# Patient Record
Sex: Female | Born: 1955 | Race: White | Hispanic: No | Smoking: Never smoker
Health system: Southern US, Community
[De-identification: ages and names within clinical notes are randomized; demographics above are authoritative.]

## PROBLEM LIST (undated history)

## (undated) ENCOUNTER — Emergency Department: Admission: EM | Payer: 59

## (undated) DIAGNOSIS — F32A Depression, unspecified: Secondary | ICD-10-CM

## (undated) DIAGNOSIS — R112 Nausea with vomiting, unspecified: Secondary | ICD-10-CM

## (undated) DIAGNOSIS — R519 Headache, unspecified: Secondary | ICD-10-CM

## (undated) DIAGNOSIS — Z981 Arthrodesis status: Secondary | ICD-10-CM

## (undated) DIAGNOSIS — E282 Polycystic ovarian syndrome: Secondary | ICD-10-CM

## (undated) DIAGNOSIS — K760 Fatty (change of) liver, not elsewhere classified: Secondary | ICD-10-CM

## (undated) DIAGNOSIS — Z9689 Presence of other specified functional implants: Secondary | ICD-10-CM

## (undated) DIAGNOSIS — F419 Anxiety disorder, unspecified: Secondary | ICD-10-CM

## (undated) DIAGNOSIS — Z8489 Family history of other specified conditions: Secondary | ICD-10-CM

## (undated) DIAGNOSIS — R252 Cramp and spasm: Secondary | ICD-10-CM

## (undated) DIAGNOSIS — M5136 Other intervertebral disc degeneration, lumbar region: Secondary | ICD-10-CM

## (undated) DIAGNOSIS — M545 Low back pain, unspecified: Secondary | ICD-10-CM

## (undated) DIAGNOSIS — G2581 Restless legs syndrome: Secondary | ICD-10-CM

## (undated) DIAGNOSIS — K635 Polyp of colon: Secondary | ICD-10-CM

## (undated) DIAGNOSIS — M51369 Other intervertebral disc degeneration, lumbar region without mention of lumbar back pain or lower extremity pain: Secondary | ICD-10-CM

## (undated) DIAGNOSIS — M549 Dorsalgia, unspecified: Secondary | ICD-10-CM

## (undated) DIAGNOSIS — E7800 Pure hypercholesterolemia, unspecified: Secondary | ICD-10-CM

## (undated) DIAGNOSIS — K759 Inflammatory liver disease, unspecified: Secondary | ICD-10-CM

## (undated) DIAGNOSIS — Z972 Presence of dental prosthetic device (complete) (partial): Secondary | ICD-10-CM

## (undated) DIAGNOSIS — K5900 Constipation, unspecified: Secondary | ICD-10-CM

## (undated) DIAGNOSIS — T7840XA Allergy, unspecified, initial encounter: Secondary | ICD-10-CM

## (undated) DIAGNOSIS — K219 Gastro-esophageal reflux disease without esophagitis: Secondary | ICD-10-CM

## (undated) DIAGNOSIS — Z9889 Other specified postprocedural states: Secondary | ICD-10-CM

## (undated) DIAGNOSIS — G43909 Migraine, unspecified, not intractable, without status migrainosus: Secondary | ICD-10-CM

## (undated) DIAGNOSIS — F329 Major depressive disorder, single episode, unspecified: Secondary | ICD-10-CM

## (undated) DIAGNOSIS — R51 Headache: Secondary | ICD-10-CM

## (undated) DIAGNOSIS — I10 Essential (primary) hypertension: Secondary | ICD-10-CM

## (undated) HISTORY — PX: DILATION AND CURETTAGE OF UTERUS: SHX78

## (undated) HISTORY — PX: COLPORRHAPHY: SHX921

## (undated) HISTORY — PX: BREAST BIOPSY: SHX20

## (undated) HISTORY — PX: BACK SURGERY: SHX140

## (undated) HISTORY — DX: Polyp of colon: K63.5

## (undated) HISTORY — PX: ABDOMINAL HYSTERECTOMY: SHX81

## (undated) HISTORY — PX: LAMINECTOMY: SHX219

## (undated) HISTORY — PX: CARPAL TUNNEL RELEASE: SHX101

## (undated) HISTORY — PX: TUBAL LIGATION: SHX77

## (undated) HISTORY — DX: Cramp and spasm: R25.2

## (undated) HISTORY — PX: TOTAL ABDOMINAL HYSTERECTOMY: SHX209

## (undated) HISTORY — DX: Arthrodesis status: Z98.1

## (undated) HISTORY — DX: Migraine, unspecified, not intractable, without status migrainosus: G43.909

## (undated) HISTORY — DX: Allergy, unspecified, initial encounter: T78.40XA

## (undated) HISTORY — PX: EYE SURGERY: SHX253

## (undated) HISTORY — PX: SPINE SURGERY: SHX786

## (undated) HISTORY — PX: COLONOSCOPY: SHX174

---

## 1980-06-23 HISTORY — PX: ECTOPIC PREGNANCY SURGERY: SHX613

## 1983-06-24 HISTORY — PX: TONSILLECTOMY: SUR1361

## 1986-06-23 HISTORY — PX: APPENDECTOMY: SHX54

## 1986-06-23 HISTORY — PX: HERNIA REPAIR: SHX51

## 1999-12-03 ENCOUNTER — Other Ambulatory Visit: Admission: RE | Admit: 1999-12-03 | Discharge: 1999-12-03 | Payer: Self-pay | Admitting: *Deleted

## 2001-12-23 ENCOUNTER — Other Ambulatory Visit: Admission: RE | Admit: 2001-12-23 | Discharge: 2001-12-23 | Payer: Self-pay | Admitting: Obstetrics and Gynecology

## 2002-06-23 HISTORY — PX: OTHER SURGICAL HISTORY: SHX169

## 2003-06-24 HISTORY — PX: BREAST SURGERY: SHX581

## 2004-04-05 ENCOUNTER — Ambulatory Visit (HOSPITAL_COMMUNITY): Admission: RE | Admit: 2004-04-05 | Discharge: 2004-04-06 | Payer: Self-pay | Admitting: Neurosurgery

## 2004-06-23 HISTORY — PX: BLADDER SUSPENSION: SHX72

## 2004-06-23 HISTORY — PX: NECK SURGERY: SHX720

## 2004-07-04 ENCOUNTER — Ambulatory Visit: Payer: Self-pay | Admitting: General Surgery

## 2004-09-26 ENCOUNTER — Ambulatory Visit: Payer: Self-pay | Admitting: Unknown Physician Specialty

## 2005-06-23 DIAGNOSIS — K760 Fatty (change of) liver, not elsewhere classified: Secondary | ICD-10-CM | POA: Insufficient documentation

## 2005-07-09 ENCOUNTER — Ambulatory Visit: Payer: Self-pay | Admitting: Pain Medicine

## 2005-07-17 ENCOUNTER — Ambulatory Visit: Payer: Self-pay | Admitting: Pain Medicine

## 2005-08-01 ENCOUNTER — Ambulatory Visit: Payer: Self-pay | Admitting: Physician Assistant

## 2005-08-07 ENCOUNTER — Ambulatory Visit: Payer: Self-pay | Admitting: Pain Medicine

## 2005-09-01 ENCOUNTER — Ambulatory Visit: Payer: Self-pay | Admitting: Pain Medicine

## 2005-09-08 ENCOUNTER — Ambulatory Visit: Payer: Self-pay | Admitting: Family Medicine

## 2005-10-07 ENCOUNTER — Encounter: Admission: RE | Admit: 2005-10-07 | Discharge: 2005-10-07 | Payer: Self-pay | Admitting: Neurosurgery

## 2005-10-17 ENCOUNTER — Ambulatory Visit: Payer: Self-pay | Admitting: General Surgery

## 2005-10-22 ENCOUNTER — Ambulatory Visit: Payer: Self-pay | Admitting: Family Medicine

## 2005-11-18 ENCOUNTER — Encounter: Admission: RE | Admit: 2005-11-18 | Discharge: 2005-11-18 | Payer: Self-pay | Admitting: Gastroenterology

## 2005-12-09 ENCOUNTER — Inpatient Hospital Stay (HOSPITAL_COMMUNITY): Admission: RE | Admit: 2005-12-09 | Discharge: 2005-12-13 | Payer: Self-pay | Admitting: Neurosurgery

## 2006-01-01 ENCOUNTER — Ambulatory Visit: Payer: Self-pay | Admitting: Urology

## 2006-01-14 ENCOUNTER — Ambulatory Visit: Payer: Self-pay | Admitting: Urology

## 2006-01-23 ENCOUNTER — Ambulatory Visit: Payer: Self-pay | Admitting: Urology

## 2006-04-16 DIAGNOSIS — F324 Major depressive disorder, single episode, in partial remission: Secondary | ICD-10-CM | POA: Insufficient documentation

## 2006-05-26 ENCOUNTER — Ambulatory Visit: Payer: Self-pay | Admitting: Family Medicine

## 2006-07-23 ENCOUNTER — Encounter: Admission: RE | Admit: 2006-07-23 | Discharge: 2006-07-23 | Payer: Self-pay | Admitting: Neurosurgery

## 2006-08-19 DIAGNOSIS — M51379 Other intervertebral disc degeneration, lumbosacral region without mention of lumbar back pain or lower extremity pain: Secondary | ICD-10-CM | POA: Insufficient documentation

## 2006-08-19 DIAGNOSIS — M5137 Other intervertebral disc degeneration, lumbosacral region: Secondary | ICD-10-CM | POA: Insufficient documentation

## 2006-09-10 ENCOUNTER — Ambulatory Visit: Payer: Self-pay | Admitting: Unknown Physician Specialty

## 2007-02-25 DIAGNOSIS — E785 Hyperlipidemia, unspecified: Secondary | ICD-10-CM | POA: Insufficient documentation

## 2007-02-26 ENCOUNTER — Ambulatory Visit: Payer: Self-pay | Admitting: General Practice

## 2007-05-13 ENCOUNTER — Ambulatory Visit: Payer: Self-pay | Admitting: Urology

## 2007-05-21 ENCOUNTER — Ambulatory Visit: Payer: Self-pay | Admitting: General Practice

## 2007-09-16 ENCOUNTER — Encounter: Admission: RE | Admit: 2007-09-16 | Discharge: 2007-09-16 | Payer: Self-pay | Admitting: Neurosurgery

## 2008-03-10 ENCOUNTER — Encounter: Admission: RE | Admit: 2008-03-10 | Discharge: 2008-03-10 | Payer: Self-pay | Admitting: Neurosurgery

## 2008-05-02 ENCOUNTER — Ambulatory Visit: Payer: Self-pay | Admitting: Family Medicine

## 2008-06-14 ENCOUNTER — Ambulatory Visit: Payer: Self-pay | Admitting: Gastroenterology

## 2008-06-19 ENCOUNTER — Ambulatory Visit: Payer: Self-pay | Admitting: Urology

## 2009-01-29 DIAGNOSIS — M62838 Other muscle spasm: Secondary | ICD-10-CM | POA: Insufficient documentation

## 2009-01-29 DIAGNOSIS — R252 Cramp and spasm: Secondary | ICD-10-CM

## 2009-01-29 HISTORY — DX: Cramp and spasm: R25.2

## 2009-03-27 ENCOUNTER — Ambulatory Visit: Payer: Self-pay | Admitting: Urology

## 2009-05-01 ENCOUNTER — Encounter: Payer: Self-pay | Admitting: General Practice

## 2009-05-01 DIAGNOSIS — K4090 Unilateral inguinal hernia, without obstruction or gangrene, not specified as recurrent: Secondary | ICD-10-CM | POA: Insufficient documentation

## 2009-05-10 ENCOUNTER — Ambulatory Visit: Payer: Self-pay | Admitting: Family Medicine

## 2009-05-23 ENCOUNTER — Encounter: Payer: Self-pay | Admitting: General Practice

## 2009-06-23 ENCOUNTER — Encounter: Payer: Self-pay | Admitting: General Practice

## 2009-08-06 ENCOUNTER — Ambulatory Visit: Payer: Self-pay | Admitting: Pain Medicine

## 2009-08-20 ENCOUNTER — Encounter: Payer: Self-pay | Admitting: Pain Medicine

## 2009-08-27 ENCOUNTER — Encounter: Payer: Self-pay | Admitting: Pain Medicine

## 2009-09-17 ENCOUNTER — Ambulatory Visit: Payer: Self-pay | Admitting: Pain Medicine

## 2009-09-21 ENCOUNTER — Encounter: Payer: Self-pay | Admitting: Pain Medicine

## 2009-09-25 ENCOUNTER — Ambulatory Visit: Payer: Self-pay | Admitting: Pain Medicine

## 2009-10-01 ENCOUNTER — Ambulatory Visit: Payer: Self-pay | Admitting: Gastroenterology

## 2009-10-29 ENCOUNTER — Ambulatory Visit: Payer: Self-pay | Admitting: Pain Medicine

## 2009-11-01 ENCOUNTER — Ambulatory Visit: Payer: Self-pay | Admitting: Pain Medicine

## 2009-12-03 ENCOUNTER — Ambulatory Visit: Payer: Self-pay | Admitting: Pain Medicine

## 2009-12-03 DIAGNOSIS — E669 Obesity, unspecified: Secondary | ICD-10-CM | POA: Insufficient documentation

## 2009-12-20 ENCOUNTER — Ambulatory Visit: Payer: Self-pay | Admitting: Pain Medicine

## 2010-01-21 ENCOUNTER — Ambulatory Visit: Payer: Self-pay | Admitting: Pain Medicine

## 2010-02-04 ENCOUNTER — Ambulatory Visit: Payer: Self-pay | Admitting: Pain Medicine

## 2010-03-18 ENCOUNTER — Ambulatory Visit: Payer: Self-pay | Admitting: Pain Medicine

## 2010-04-04 ENCOUNTER — Ambulatory Visit: Payer: Self-pay | Admitting: Urology

## 2010-04-08 LAB — HM HEPATITIS C SCREENING LAB: HM Hepatitis Screen: NEGATIVE

## 2010-05-23 ENCOUNTER — Ambulatory Visit: Payer: Self-pay | Admitting: Family Medicine

## 2010-07-14 ENCOUNTER — Encounter: Payer: Self-pay | Admitting: Neurosurgery

## 2010-07-18 ENCOUNTER — Ambulatory Visit: Payer: Self-pay | Admitting: Pain Medicine

## 2010-08-12 ENCOUNTER — Ambulatory Visit: Payer: Self-pay | Admitting: Pain Medicine

## 2010-08-19 ENCOUNTER — Ambulatory Visit: Payer: Self-pay | Admitting: Pain Medicine

## 2010-09-10 ENCOUNTER — Ambulatory Visit: Payer: Self-pay | Admitting: Pain Medicine

## 2010-09-19 ENCOUNTER — Ambulatory Visit: Payer: Self-pay | Admitting: Pain Medicine

## 2010-09-30 ENCOUNTER — Ambulatory Visit: Payer: Self-pay | Admitting: Pain Medicine

## 2010-11-08 NOTE — Op Note (Signed)
[name], Courtney Ross               ACCOUNT NO.:  [id number]   MEDICAL RECORD NO.:  [id number]          PATIENT TYPE:  OIB   LOCATION:  2899                         FACILITY:  [facility]   PHYSICIAN:  Payton Doughty, M.D.      DATE OF BIRTH:  02-Jul-1955   DATE OF PROCEDURE:  04/05/2004  DATE OF DISCHARGE:                                 OPERATIVE REPORT   PREOPERATIVE DIAGNOSIS:  Herniated disk C5-6 on the left.   POSTOPERATIVE DIAGNOSIS:  Herniated disk C5-6 on the left.   PROCEDURE:  Anterior cervical decompression and fusion with reflex hybrid  plate.   SURGEON:  Payton Doughty, M.D.   NURSE ASSISTANT:  [facility].   ANESTHESIA:  General endotracheal.   PREPARATION:  Betadine prep, alcohol wipe.   COMPLICATIONS:  None.   INDICATIONS FOR PROCEDURE:  A 55 year old lady with left C6 radiculopathy  and herniated disk.   DESCRIPTION OF PROCEDURE:  Taken to the operating room, smoothly intubated,  placed supine on the operating table in Halter head traction.  Following  shave, prep and drape in the usual sterile fashion, skin was incised at the  midline, medial border of the sternocleidomastoid muscle on the left side.  The platysma was identified, elevated, divided, undermined.  Sternocleidomastoid was identified.  Medial dissection revealed the carotid  artery tracked laterally to the left, trachea and esophagus retracted  laterally to the right.  This exposed the bones of the anterior cervical  spine.  Marker was placed, intraoperative x-ray obtained to confirm  correctness of level.   Having confirmed correctness of level, diskectomy was carried out at C6-7  under gross observation. The operating microscope was then brought in and we  used microdissection technique to remove the remaining disk, divide the  posterior longitudinal ligament and explore the neuroforamen.  There were  several large fragments of disk recovered from the left C6 neuroforamen.  Following complete  decompression of the nerve root, the wound was irrigated  and hemostasis was assured.   The 7 mm bone graft was taped, and fashioned from patellar allograft, tapped  into place.  A 14 reflex hybrid plate was then used with 12 mm screws.  Intraoperative x-ray showed good placement of bone graft plate and screws.  The platysma was reapproximated with 3-0 Vicryl in interrupted fashion,  subcutaneous tissue was reapproximated with 3-0 Vicryl in interrupted  fashion and skin was closed with 4-0 Vicryl in running, subcuticular  fashion.  Benzoin  and Steri-Strips were placed,  made occlusive with Telfa and Op-Site.  The patient returned to the recovery  room after being placed in an Aspen collar.       MWR/MEDQ  D:  04/05/2004  T:  04/05/2004  Job:  [id number]

## 2010-11-08 NOTE — H&P (Signed)
NAMEJUSTYN, Courtney Ross               ACCOUNT NO.:  [id number]   MEDICAL RECORD NO.:  [id number]          PATIENT TYPE:  OIB   LOCATION:  2899                         FACILITY:  [facility]   PHYSICIAN:  Payton Doughty, M.D.      DATE OF BIRTH:  April 22, 1956   DATE OF ADMISSION:  04/05/2004  DATE OF DISCHARGE:                                HISTORY & PHYSICAL   ADMISSION DIAGNOSES:  Herniated disk C5-6 on the left.   HISTORY OF PRESENT ILLNESS:  This is a 55 year old right handed white female  who for a couple of weeks has had increasing pain in her neck, down her left  arm.  MR shows herniated disk at 5-6 on the left, and she is admitted for an  anterior decompression fusion.   PAST MEDICAL HISTORY:  1.  Hernia and appendectomy in 1991.  2.  Tonsillectomy in 1986.  3.  Tubal pregnancy in 1980.  4.  Hysterectomy in 1986.  5.  Lumbar diskectomy in 1993 and 1994.   ALLERGIES:  None.   MEDICATIONS:  None.   SOCIAL HISTORY:  She does not smoke or drink.  She is employed at a [employer].   PHYSICAL EXAMINATION:  HEENT:  Within normal limits.  She has reasonable  range of motion in her neck, but reproduces left arm pain.  CHEST:  Clear.  CARDIAC:  Regular rate and rhythm.  ABDOMEN:  Nontender.  No hepatosplenomegaly.  EXTREMITIES:  No cyanosis, clubbing.  Peripheral pulses are good.  GU:  Deferred.  NEUROLOGICAL:  She is awake, alert and oriented.  Cranial nerves are intact.  Lower exam shows 5/5 strength throughout the upper extremities, save for  left triceps which is 4/5.  Sensation diminished in the left C6 and C7  distribution.  Biceps reflexes are 2, triceps is 1 on the left and 2 on the  right.  Toes are downgoing.  Hoffman's is negative.   STUDIES:  MR demonstrates a herniated disk at C5-6, eccentric to the left.   CLINICAL IMPRESSION:  A left C6 radiculopathy secondary to herniated disk at  C5-C6.   PLAN:  Anterior cervical decompression at C5-6.  Risks and benefits of  this  approach have been discussed with her, and she wished to proceed.       MWR/MEDQ  D:  04/05/2004  T:  04/05/2004  Job:  [id number]

## 2010-11-08 NOTE — H&P (Signed)
Courtney Ross, Courtney Ross               ACCOUNT NO.:  [id number]   MEDICAL RECORD NO.:  [id number]          PATIENT TYPE:  INP   LOCATION:  2853                         FACILITY:  [facility]   PHYSICIAN:  Payton Doughty, M.D.      DATE OF BIRTH:  05-30-1956   DATE OF ADMISSION:  12/09/2005  DATE OF DISCHARGE:                                HISTORY & PHYSICAL   ADMISSION DIAGNOSIS:  Spondylosis, L4-5, L5-S1.   BODY OF TEXT:  A 55 year old right-handed white female has had increasing  pain in her back and down her lower extremities bilaterally, worse on the  right than on the left.  Diskography was strongly concordant positive, 4-5  and 5-1.  She is admitted for fusion.  Medical history is remarkable for  hernia and appendectomy in 1991, tonsillectomy in 1986, tubal pregnancy in  1980, hysterectomy in 1986, diskectomy and redo diskectomy at 5-1 on the  right in 1993 and 1994.   ALLERGIES:  None.   MEDICATIONS:  None.   SOCIAL HISTORY:  Does not smoke.  Does not drink.  Currently not working.   FAMILY HISTORY:  Not given.   REVIEW OF SYSTEMS:  Remarkable for back pain, leg pain.   PHYSICAL EXAMINATION:  HEENT:  Within normal limits.  NECK:  She has reasonable range of motion in her neck.  CHEST:  Clear.  CARDIAC:  Regular rate and rhythm.  ABDOMEN:  Nontender with no hepatosplenomegaly.  EXTREMITIES:  Without clubbing or cyanosis.  GU:  Deferred.  Peripheral pulses are good.  NEUROLOGIC:  She is awake, alert and oriented.  Cranial nerves are intact.  Motor exam showed 5/5 strength throughout the upper and lower extremities  save for the dorsiflexors on the right, which are -5/5.  She has bilateral  L5 sensory deficits.  Reflexes are 2+ at the knees, absent at the ankles.  Toes are downgoing bilaterally.   Diskography results are included and reviewed above.   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   The plan is for lumbar laminectomy, diskectomy, posterior lumbar  interbody  fusion, 4-5 and 5-1, and posterolateral arthrodesis pedicle screws will be  employed should they be needed.  The risks and benefits of this approach  have been discussed with her, and she wishes to proceed.           ______________________________  Payton Doughty, M.D.     MWR/MEDQ  D:  12/09/2005  T:  12/09/2005  Job:  [id number]

## 2010-11-08 NOTE — Op Note (Signed)
NAMEREHMAT, MURTAGH               ACCOUNT NO.:  [id number]   MEDICAL RECORD NO.:  [id number]          PATIENT TYPE:  INP   LOCATION:  3002                         FACILITY:  [facility]   PHYSICIAN:  Payton Doughty, M.D.      DATE OF BIRTH:  02/18/1956   DATE OF PROCEDURE:  12/09/2005  DATE OF DISCHARGE:                                 OPERATIVE REPORT   PREOPERATIVE DIAGNOSIS:  Spondylosis at L4-5, L5-S1.   POSTOPERATIVE DIAGNOSIS:  Spondylosis at L4-5, L5-S1.   PROCEDURE:  L4-5 laminectomy and diskectomy, posterior lumbar interbody  fusion with Ray Threaded Fusion Cages, posterolateral arthrodesis, and L5-S1  laminectomy, facetectomy, nonsegmental pedicle screw fixation and  posterolateral arthrodesis.   SURGEON:  Payton Doughty, M.D.   NURSE ASSISTANT:  [facility].   DOCTOR ASSISTANT:  Clydene Fake, M.D.   ANESTHESIA:  General endotracheal.   PREPARATION:  Betadine prep and scrub with alcohol wipe.   COMPLICATIONS:  None.   BODY OF TEXT:  This is a 55 year old right-handed white girl with severe  lumbar spondylosis.  She has had a prior L5-S1 diskectomy twice on the  right.  Taken to the operating room and smoothly anesthetized and intubated  and placed prone on the operating table with pressure points padded.  Following shave, prep and drape in the usual sterile fashion, the skin was  infiltrated with 1% lidocaine and 1:400,000 epinephrine.  The skin was  incised from mid-S1 to mid-L3.  The laminae and transverse processes of L4,  L5 and the sacral ala were exposed bilaterally in the subperiosteal plane.  Intraoperative x-ray confirmed correctness of the level.  Having confirmed  correctness of the level, the pars interarticularis, remaining lamina and  inferior facet of L4 and L5 and the superior facet of L5 and S1 were removed  bilaterally using the high-speed drill.  On the right side there was a  fracture of the superior articular process of L5.  This fracture was tipped  down and directly into the right L5 nerve root.  Following complete  decompression by removing the above-named structures and the attendant  ligamentum flavum, both the L4 and L5 roots were easily identified.  The L5  root on the left side was conjoined.  The disk space was quite effaced.  It  was not possible to place Ray Cages at L5-S1.  L4-5 presented no difficulty,  12 x 21 mm cages were placed with a good fit.  Pedicle screws were placed in  L5 and S1 using the standard landmarks.  Rods were placed across them and  they were connected.  Cages were packed with bone harvested from the facet  joints and capped.  OP-1 was mixed with remaining bone and the extender  matrix and placed on the transverse processes and sacral ala.  Intraoperative x-ray  showed good placement of the Ray Cages and pedicle screws.  The entire  affair was closed with successive layers of 0 Vicryl, 2-0 Vicryl and 3-0  nylon.  Betadine and Telfa dressing was applied and the patient returned to  the recovery room in good  condition.           ______________________________  Payton Doughty, M.D.     MWR/MEDQ  D:  12/09/2005  T:  12/10/2005  Job:  [id number]

## 2010-11-08 NOTE — Discharge Summary (Signed)
[name], [name]               ACCOUNT NO.:  [id number]   MEDICAL RECORD NO.:  [id number]          PATIENT TYPE:  INP   LOCATION:  3002                         FACILITY:  [facility]   PHYSICIAN:  Clydene Fake, M.D.  DATE OF BIRTH:  02-09-1956   DATE OF ADMISSION:  12/09/2005  DATE OF DISCHARGE:  12/13/2005                                 DISCHARGE SUMMARY   DIAGNOSIS:  Spondylosis of L4-5 and L5-S1.   DISCHARGE DIAGNOSIS:  Spondylosis of L4-5 and L5-S1.   PROCEDURE:  1.  L4-5 __________ cage.  2.  L5-S1 decompression and fusion.   REASON FOR ADMISSION:  The patient is a 55 year old woman with increasing  back and bilateral leg pain, worse on the right.  Discogram showed  concordant __________  at L4-5 and L5-S1.  She was admitted for  decompression and fusion.   HOSPITAL COURSE:  The patient was admitted on the day of surgery and  underwent the procedure without complications.  Postop the patient was  transferred to the floor.  She did well.  Started ambulating.  Incision was  checked and was clean and dry.  PT and OT were consulted and worked with the  patient.  She continued to __________  and is ambulating well.  Less leg  pain and was discharged home on December 13, 2005, in stable condition.  Discharge __________  hospitalization.  Follow up with Dr. __________  in 3-  4 weeks.  __________  activity.           ______________________________  Clydene Fake, M.D.     JRH/MEDQ  D:  01/01/2006  T:  01/02/2006  Job:  [id number]

## 2011-07-02 ENCOUNTER — Ambulatory Visit: Payer: Self-pay | Admitting: Family Medicine

## 2011-07-02 DIAGNOSIS — Z1231 Encounter for screening mammogram for malignant neoplasm of breast: Secondary | ICD-10-CM | POA: Diagnosis not present

## 2011-07-02 DIAGNOSIS — R928 Other abnormal and inconclusive findings on diagnostic imaging of breast: Secondary | ICD-10-CM | POA: Diagnosis not present

## 2011-11-13 DIAGNOSIS — Z124 Encounter for screening for malignant neoplasm of cervix: Secondary | ICD-10-CM | POA: Diagnosis not present

## 2011-11-13 DIAGNOSIS — Z0000 Encounter for general adult medical examination without abnormal findings: Secondary | ICD-10-CM | POA: Diagnosis not present

## 2011-12-05 DIAGNOSIS — M5030 Other cervical disc degeneration, unspecified cervical region: Secondary | ICD-10-CM | POA: Diagnosis not present

## 2011-12-05 DIAGNOSIS — E785 Hyperlipidemia, unspecified: Secondary | ICD-10-CM | POA: Diagnosis not present

## 2011-12-05 DIAGNOSIS — F329 Major depressive disorder, single episode, unspecified: Secondary | ICD-10-CM | POA: Diagnosis not present

## 2011-12-05 DIAGNOSIS — E782 Mixed hyperlipidemia: Secondary | ICD-10-CM | POA: Diagnosis not present

## 2011-12-05 DIAGNOSIS — R894 Abnormal immunological findings in specimens from other organs, systems and tissues: Secondary | ICD-10-CM | POA: Diagnosis not present

## 2011-12-11 DIAGNOSIS — M47817 Spondylosis without myelopathy or radiculopathy, lumbosacral region: Secondary | ICD-10-CM | POA: Diagnosis not present

## 2012-05-25 DIAGNOSIS — Z23 Encounter for immunization: Secondary | ICD-10-CM | POA: Diagnosis not present

## 2012-06-08 DIAGNOSIS — F329 Major depressive disorder, single episode, unspecified: Secondary | ICD-10-CM | POA: Diagnosis not present

## 2012-06-08 DIAGNOSIS — J019 Acute sinusitis, unspecified: Secondary | ICD-10-CM | POA: Diagnosis not present

## 2012-06-08 DIAGNOSIS — M65849 Other synovitis and tenosynovitis, unspecified hand: Secondary | ICD-10-CM | POA: Diagnosis not present

## 2012-06-08 DIAGNOSIS — F411 Generalized anxiety disorder: Secondary | ICD-10-CM | POA: Diagnosis not present

## 2012-06-08 DIAGNOSIS — M65839 Other synovitis and tenosynovitis, unspecified forearm: Secondary | ICD-10-CM | POA: Diagnosis not present

## 2012-06-23 HISTORY — PX: SPINAL CORD STIMULATOR IMPLANT: SHX2422

## 2012-07-05 ENCOUNTER — Ambulatory Visit: Payer: Self-pay | Admitting: Obstetrics and Gynecology

## 2012-07-05 DIAGNOSIS — R928 Other abnormal and inconclusive findings on diagnostic imaging of breast: Secondary | ICD-10-CM | POA: Diagnosis not present

## 2012-07-05 DIAGNOSIS — Z1231 Encounter for screening mammogram for malignant neoplasm of breast: Secondary | ICD-10-CM | POA: Diagnosis not present

## 2012-11-16 DIAGNOSIS — Z01419 Encounter for gynecological examination (general) (routine) without abnormal findings: Secondary | ICD-10-CM | POA: Diagnosis not present

## 2012-11-16 DIAGNOSIS — Z124 Encounter for screening for malignant neoplasm of cervix: Secondary | ICD-10-CM | POA: Diagnosis not present

## 2012-11-16 DIAGNOSIS — Z1331 Encounter for screening for depression: Secondary | ICD-10-CM | POA: Diagnosis not present

## 2012-12-02 DIAGNOSIS — Z86010 Personal history of colonic polyps: Secondary | ICD-10-CM | POA: Diagnosis not present

## 2012-12-02 DIAGNOSIS — K921 Melena: Secondary | ICD-10-CM | POA: Diagnosis not present

## 2012-12-02 DIAGNOSIS — K5900 Constipation, unspecified: Secondary | ICD-10-CM | POA: Diagnosis not present

## 2012-12-15 DIAGNOSIS — F329 Major depressive disorder, single episode, unspecified: Secondary | ICD-10-CM | POA: Diagnosis not present

## 2012-12-15 DIAGNOSIS — G894 Chronic pain syndrome: Secondary | ICD-10-CM | POA: Diagnosis not present

## 2012-12-15 DIAGNOSIS — F411 Generalized anxiety disorder: Secondary | ICD-10-CM | POA: Diagnosis not present

## 2012-12-15 DIAGNOSIS — M5137 Other intervertebral disc degeneration, lumbosacral region: Secondary | ICD-10-CM | POA: Diagnosis not present

## 2012-12-15 DIAGNOSIS — E785 Hyperlipidemia, unspecified: Secondary | ICD-10-CM | POA: Diagnosis not present

## 2012-12-16 DIAGNOSIS — E782 Mixed hyperlipidemia: Secondary | ICD-10-CM | POA: Diagnosis not present

## 2013-01-12 DIAGNOSIS — Z86010 Personal history of colonic polyps: Secondary | ICD-10-CM | POA: Diagnosis not present

## 2013-01-12 DIAGNOSIS — R197 Diarrhea, unspecified: Secondary | ICD-10-CM | POA: Diagnosis not present

## 2013-01-12 DIAGNOSIS — K921 Melena: Secondary | ICD-10-CM | POA: Diagnosis not present

## 2013-01-12 DIAGNOSIS — Z09 Encounter for follow-up examination after completed treatment for conditions other than malignant neoplasm: Secondary | ICD-10-CM | POA: Diagnosis not present

## 2013-01-12 LAB — HM COLONOSCOPY

## 2013-01-19 DIAGNOSIS — M47817 Spondylosis without myelopathy or radiculopathy, lumbosacral region: Secondary | ICD-10-CM | POA: Diagnosis not present

## 2013-02-28 DIAGNOSIS — H00019 Hordeolum externum unspecified eye, unspecified eyelid: Secondary | ICD-10-CM | POA: Diagnosis not present

## 2013-03-24 DIAGNOSIS — Z23 Encounter for immunization: Secondary | ICD-10-CM | POA: Diagnosis not present

## 2013-07-27 DIAGNOSIS — F3289 Other specified depressive episodes: Secondary | ICD-10-CM | POA: Diagnosis not present

## 2013-07-27 DIAGNOSIS — E785 Hyperlipidemia, unspecified: Secondary | ICD-10-CM | POA: Diagnosis not present

## 2013-07-27 DIAGNOSIS — J019 Acute sinusitis, unspecified: Secondary | ICD-10-CM | POA: Diagnosis not present

## 2013-07-27 DIAGNOSIS — F411 Generalized anxiety disorder: Secondary | ICD-10-CM | POA: Diagnosis not present

## 2013-07-27 DIAGNOSIS — F329 Major depressive disorder, single episode, unspecified: Secondary | ICD-10-CM | POA: Diagnosis not present

## 2014-02-02 DIAGNOSIS — M47817 Spondylosis without myelopathy or radiculopathy, lumbosacral region: Secondary | ICD-10-CM | POA: Diagnosis not present

## 2014-02-10 DIAGNOSIS — J069 Acute upper respiratory infection, unspecified: Secondary | ICD-10-CM | POA: Diagnosis not present

## 2014-02-10 DIAGNOSIS — IMO0002 Reserved for concepts with insufficient information to code with codable children: Secondary | ICD-10-CM | POA: Diagnosis not present

## 2014-02-10 DIAGNOSIS — M75100 Unspecified rotator cuff tear or rupture of unspecified shoulder, not specified as traumatic: Secondary | ICD-10-CM | POA: Diagnosis not present

## 2014-02-10 DIAGNOSIS — E785 Hyperlipidemia, unspecified: Secondary | ICD-10-CM | POA: Diagnosis not present

## 2014-02-10 DIAGNOSIS — F411 Generalized anxiety disorder: Secondary | ICD-10-CM | POA: Diagnosis not present

## 2014-02-23 DIAGNOSIS — IMO0002 Reserved for concepts with insufficient information to code with codable children: Secondary | ICD-10-CM | POA: Diagnosis not present

## 2014-02-23 DIAGNOSIS — M75100 Unspecified rotator cuff tear or rupture of unspecified shoulder, not specified as traumatic: Secondary | ICD-10-CM | POA: Diagnosis not present

## 2014-02-23 DIAGNOSIS — M7520 Bicipital tendinitis, unspecified shoulder: Secondary | ICD-10-CM | POA: Diagnosis not present

## 2014-03-20 DIAGNOSIS — H00029 Hordeolum internum unspecified eye, unspecified eyelid: Secondary | ICD-10-CM | POA: Diagnosis not present

## 2014-03-20 DIAGNOSIS — H524 Presbyopia: Secondary | ICD-10-CM | POA: Diagnosis not present

## 2014-04-04 DIAGNOSIS — M5412 Radiculopathy, cervical region: Secondary | ICD-10-CM | POA: Diagnosis not present

## 2014-04-04 DIAGNOSIS — M25811 Other specified joint disorders, right shoulder: Secondary | ICD-10-CM | POA: Diagnosis not present

## 2014-04-04 DIAGNOSIS — M7521 Bicipital tendinitis, right shoulder: Secondary | ICD-10-CM | POA: Diagnosis not present

## 2014-04-11 ENCOUNTER — Ambulatory Visit: Payer: Self-pay | Admitting: Specialist

## 2014-04-11 DIAGNOSIS — M25511 Pain in right shoulder: Secondary | ICD-10-CM | POA: Diagnosis not present

## 2014-04-11 DIAGNOSIS — M19011 Primary osteoarthritis, right shoulder: Secondary | ICD-10-CM | POA: Diagnosis not present

## 2014-04-11 DIAGNOSIS — M25551 Pain in right hip: Secondary | ICD-10-CM | POA: Diagnosis not present

## 2014-04-11 DIAGNOSIS — M25811 Other specified joint disorders, right shoulder: Secondary | ICD-10-CM | POA: Diagnosis not present

## 2014-04-13 DIAGNOSIS — M7521 Bicipital tendinitis, right shoulder: Secondary | ICD-10-CM | POA: Diagnosis not present

## 2014-04-13 DIAGNOSIS — M25811 Other specified joint disorders, right shoulder: Secondary | ICD-10-CM | POA: Diagnosis not present

## 2014-04-13 DIAGNOSIS — M5412 Radiculopathy, cervical region: Secondary | ICD-10-CM | POA: Diagnosis not present

## 2014-04-21 DIAGNOSIS — M7521 Bicipital tendinitis, right shoulder: Secondary | ICD-10-CM | POA: Diagnosis not present

## 2014-04-21 DIAGNOSIS — M5412 Radiculopathy, cervical region: Secondary | ICD-10-CM | POA: Diagnosis not present

## 2014-04-21 DIAGNOSIS — M25511 Pain in right shoulder: Secondary | ICD-10-CM | POA: Diagnosis not present

## 2014-04-21 DIAGNOSIS — M25811 Other specified joint disorders, right shoulder: Secondary | ICD-10-CM | POA: Diagnosis not present

## 2014-04-25 DIAGNOSIS — M25811 Other specified joint disorders, right shoulder: Secondary | ICD-10-CM | POA: Diagnosis not present

## 2014-04-25 DIAGNOSIS — M5412 Radiculopathy, cervical region: Secondary | ICD-10-CM | POA: Diagnosis not present

## 2014-04-25 DIAGNOSIS — M25511 Pain in right shoulder: Secondary | ICD-10-CM | POA: Diagnosis not present

## 2014-04-28 DIAGNOSIS — M25511 Pain in right shoulder: Secondary | ICD-10-CM | POA: Diagnosis not present

## 2014-04-28 DIAGNOSIS — M5412 Radiculopathy, cervical region: Secondary | ICD-10-CM | POA: Diagnosis not present

## 2014-05-11 DIAGNOSIS — Z23 Encounter for immunization: Secondary | ICD-10-CM | POA: Diagnosis not present

## 2014-05-30 DIAGNOSIS — M47812 Spondylosis without myelopathy or radiculopathy, cervical region: Secondary | ICD-10-CM | POA: Diagnosis not present

## 2014-05-30 DIAGNOSIS — M47816 Spondylosis without myelopathy or radiculopathy, lumbar region: Secondary | ICD-10-CM | POA: Diagnosis not present

## 2014-07-05 ENCOUNTER — Ambulatory Visit: Payer: Self-pay | Admitting: Neurosurgery

## 2014-07-05 DIAGNOSIS — M47812 Spondylosis without myelopathy or radiculopathy, cervical region: Secondary | ICD-10-CM | POA: Diagnosis not present

## 2014-07-05 DIAGNOSIS — M5126 Other intervertebral disc displacement, lumbar region: Secondary | ICD-10-CM | POA: Diagnosis not present

## 2014-07-05 DIAGNOSIS — M47892 Other spondylosis, cervical region: Secondary | ICD-10-CM | POA: Diagnosis not present

## 2014-07-05 DIAGNOSIS — Z981 Arthrodesis status: Secondary | ICD-10-CM | POA: Diagnosis not present

## 2014-07-05 DIAGNOSIS — M5022 Other cervical disc displacement, mid-cervical region: Secondary | ICD-10-CM | POA: Diagnosis not present

## 2014-07-07 DIAGNOSIS — J0100 Acute maxillary sinusitis, unspecified: Secondary | ICD-10-CM | POA: Diagnosis not present

## 2014-07-07 DIAGNOSIS — R05 Cough: Secondary | ICD-10-CM | POA: Diagnosis not present

## 2014-08-09 DIAGNOSIS — M47816 Spondylosis without myelopathy or radiculopathy, lumbar region: Secondary | ICD-10-CM | POA: Diagnosis not present

## 2014-08-09 DIAGNOSIS — M47812 Spondylosis without myelopathy or radiculopathy, cervical region: Secondary | ICD-10-CM | POA: Diagnosis not present

## 2014-08-25 ENCOUNTER — Ambulatory Visit: Payer: Self-pay | Admitting: Family Medicine

## 2014-08-25 DIAGNOSIS — Z1231 Encounter for screening mammogram for malignant neoplasm of breast: Secondary | ICD-10-CM | POA: Diagnosis not present

## 2014-08-25 LAB — HM MAMMOGRAPHY: HM Mammogram: NEGATIVE

## 2014-09-01 DIAGNOSIS — M5030 Other cervical disc degeneration, unspecified cervical region: Secondary | ICD-10-CM | POA: Diagnosis not present

## 2014-09-01 DIAGNOSIS — M47816 Spondylosis without myelopathy or radiculopathy, lumbar region: Secondary | ICD-10-CM | POA: Diagnosis not present

## 2014-09-01 DIAGNOSIS — M5136 Other intervertebral disc degeneration, lumbar region: Secondary | ICD-10-CM | POA: Diagnosis not present

## 2014-09-01 DIAGNOSIS — M47812 Spondylosis without myelopathy or radiculopathy, cervical region: Secondary | ICD-10-CM | POA: Diagnosis not present

## 2014-09-21 DIAGNOSIS — M47812 Spondylosis without myelopathy or radiculopathy, cervical region: Secondary | ICD-10-CM | POA: Diagnosis not present

## 2014-09-21 DIAGNOSIS — G8929 Other chronic pain: Secondary | ICD-10-CM | POA: Diagnosis not present

## 2014-09-21 DIAGNOSIS — Z981 Arthrodesis status: Secondary | ICD-10-CM | POA: Diagnosis not present

## 2014-09-21 DIAGNOSIS — Z885 Allergy status to narcotic agent status: Secondary | ICD-10-CM | POA: Diagnosis not present

## 2014-09-21 DIAGNOSIS — F329 Major depressive disorder, single episode, unspecified: Secondary | ICD-10-CM | POA: Diagnosis not present

## 2014-09-21 DIAGNOSIS — M47816 Spondylosis without myelopathy or radiculopathy, lumbar region: Secondary | ICD-10-CM | POA: Diagnosis not present

## 2014-09-21 DIAGNOSIS — E119 Type 2 diabetes mellitus without complications: Secondary | ICD-10-CM | POA: Diagnosis not present

## 2014-09-21 DIAGNOSIS — F419 Anxiety disorder, unspecified: Secondary | ICD-10-CM | POA: Diagnosis not present

## 2014-09-21 DIAGNOSIS — Z9889 Other specified postprocedural states: Secondary | ICD-10-CM | POA: Diagnosis not present

## 2014-09-21 DIAGNOSIS — E785 Hyperlipidemia, unspecified: Secondary | ICD-10-CM | POA: Diagnosis not present

## 2014-09-21 DIAGNOSIS — M5030 Other cervical disc degeneration, unspecified cervical region: Secondary | ICD-10-CM | POA: Diagnosis not present

## 2014-09-21 DIAGNOSIS — Z79899 Other long term (current) drug therapy: Secondary | ICD-10-CM | POA: Diagnosis not present

## 2014-09-21 DIAGNOSIS — M5136 Other intervertebral disc degeneration, lumbar region: Secondary | ICD-10-CM | POA: Diagnosis not present

## 2014-10-18 DIAGNOSIS — M47812 Spondylosis without myelopathy or radiculopathy, cervical region: Secondary | ICD-10-CM | POA: Diagnosis not present

## 2014-10-20 DIAGNOSIS — S93401A Sprain of unspecified ligament of right ankle, initial encounter: Secondary | ICD-10-CM | POA: Diagnosis not present

## 2014-11-06 DIAGNOSIS — G894 Chronic pain syndrome: Secondary | ICD-10-CM | POA: Diagnosis not present

## 2014-11-06 DIAGNOSIS — F419 Anxiety disorder, unspecified: Secondary | ICD-10-CM | POA: Diagnosis not present

## 2014-11-06 DIAGNOSIS — E782 Mixed hyperlipidemia: Secondary | ICD-10-CM | POA: Diagnosis not present

## 2014-11-06 DIAGNOSIS — Z0000 Encounter for general adult medical examination without abnormal findings: Secondary | ICD-10-CM | POA: Diagnosis not present

## 2014-11-06 DIAGNOSIS — F329 Major depressive disorder, single episode, unspecified: Secondary | ICD-10-CM | POA: Diagnosis not present

## 2014-11-06 DIAGNOSIS — R896 Abnormal cytological findings in specimens from other organs, systems and tissues: Secondary | ICD-10-CM | POA: Diagnosis not present

## 2014-11-06 LAB — HM PAP SMEAR: HM Pap smear: NORMAL

## 2014-11-08 DIAGNOSIS — M47812 Spondylosis without myelopathy or radiculopathy, cervical region: Secondary | ICD-10-CM | POA: Diagnosis not present

## 2014-11-08 DIAGNOSIS — M47816 Spondylosis without myelopathy or radiculopathy, lumbar region: Secondary | ICD-10-CM | POA: Diagnosis not present

## 2014-11-08 DIAGNOSIS — M5030 Other cervical disc degeneration, unspecified cervical region: Secondary | ICD-10-CM | POA: Diagnosis not present

## 2014-11-08 DIAGNOSIS — M5136 Other intervertebral disc degeneration, lumbar region: Secondary | ICD-10-CM | POA: Diagnosis not present

## 2014-11-09 DIAGNOSIS — E782 Mixed hyperlipidemia: Secondary | ICD-10-CM | POA: Diagnosis not present

## 2014-11-09 DIAGNOSIS — E039 Hypothyroidism, unspecified: Secondary | ICD-10-CM | POA: Diagnosis not present

## 2014-11-09 LAB — LIPID PANEL
Cholesterol: 239 mg/dL — AB (ref 0–200)
HDL: 68 mg/dL (ref 35–70)
LDL Cholesterol: 125 mg/dL
Triglycerides: 232 mg/dL — AB (ref 40–160)

## 2014-11-09 LAB — HEPATIC FUNCTION PANEL
ALT: 38 U/L — AB (ref 7–35)
AST: 37 U/L — AB (ref 13–35)

## 2014-11-09 LAB — TSH: TSH: 3.74 u[IU]/mL (ref 0.41–5.90)

## 2014-11-09 LAB — BASIC METABOLIC PANEL
BUN: 12 mg/dL (ref 4–21)
Creatinine: 0.8 mg/dL (ref 0.5–1.1)
Glucose: 97 mg/dL
Potassium: 4.4 mmol/L (ref 3.4–5.3)
Sodium: 140 mmol/L (ref 137–147)

## 2014-11-22 ENCOUNTER — Telehealth: Payer: Self-pay | Admitting: Family Medicine

## 2014-11-22 DIAGNOSIS — G894 Chronic pain syndrome: Secondary | ICD-10-CM | POA: Insufficient documentation

## 2014-11-22 DIAGNOSIS — Z01419 Encounter for gynecological examination (general) (routine) without abnormal findings: Secondary | ICD-10-CM | POA: Insufficient documentation

## 2014-11-22 DIAGNOSIS — E065 Other chronic thyroiditis: Secondary | ICD-10-CM | POA: Insufficient documentation

## 2014-11-22 DIAGNOSIS — M2040 Other hammer toe(s) (acquired), unspecified foot: Secondary | ICD-10-CM | POA: Diagnosis not present

## 2014-11-22 DIAGNOSIS — N390 Urinary tract infection, site not specified: Secondary | ICD-10-CM | POA: Insufficient documentation

## 2014-11-22 DIAGNOSIS — M65319 Trigger thumb, unspecified thumb: Secondary | ICD-10-CM | POA: Insufficient documentation

## 2014-11-22 DIAGNOSIS — Z8669 Personal history of other diseases of the nervous system and sense organs: Secondary | ICD-10-CM | POA: Insufficient documentation

## 2014-11-22 DIAGNOSIS — G8929 Other chronic pain: Secondary | ICD-10-CM | POA: Diagnosis not present

## 2014-11-22 DIAGNOSIS — M7550 Bursitis of unspecified shoulder: Secondary | ICD-10-CM | POA: Insufficient documentation

## 2014-11-22 DIAGNOSIS — L989 Disorder of the skin and subcutaneous tissue, unspecified: Secondary | ICD-10-CM | POA: Insufficient documentation

## 2014-11-22 DIAGNOSIS — M2032 Hallux varus (acquired), left foot: Secondary | ICD-10-CM | POA: Diagnosis not present

## 2014-11-22 DIAGNOSIS — R87620 Atypical squamous cells of undetermined significance on cytologic smear of vagina (ASC-US): Secondary | ICD-10-CM | POA: Insufficient documentation

## 2014-11-22 DIAGNOSIS — Z86010 Personal history of colonic polyps: Secondary | ICD-10-CM | POA: Insufficient documentation

## 2014-11-22 DIAGNOSIS — E038 Other specified hypothyroidism: Secondary | ICD-10-CM | POA: Insufficient documentation

## 2014-11-22 DIAGNOSIS — M79672 Pain in left foot: Secondary | ICD-10-CM | POA: Diagnosis not present

## 2014-11-22 DIAGNOSIS — D6862 Lupus anticoagulant syndrome: Secondary | ICD-10-CM | POA: Insufficient documentation

## 2014-11-22 HISTORY — DX: Atypical squamous cells of undetermined significance on cytologic smear of vagina (ASC-US): R87.620

## 2014-11-22 HISTORY — DX: Personal history of other diseases of the nervous system and sense organs: Z86.69

## 2014-11-22 MED ORDER — OXYCODONE HCL 15 MG PO TABS: ORAL_TABLET | ORAL | Status: DC

## 2014-11-22 NOTE — Telephone Encounter (Signed)
done

## 2014-12-05 ENCOUNTER — Encounter: Payer: Self-pay | Admitting: *Deleted

## 2014-12-05 DIAGNOSIS — M2042 Other hammer toe(s) (acquired), left foot: Secondary | ICD-10-CM | POA: Diagnosis not present

## 2014-12-05 DIAGNOSIS — M2032 Hallux varus (acquired), left foot: Secondary | ICD-10-CM | POA: Diagnosis not present

## 2014-12-07 DIAGNOSIS — Z981 Arthrodesis status: Secondary | ICD-10-CM | POA: Diagnosis not present

## 2014-12-07 DIAGNOSIS — F419 Anxiety disorder, unspecified: Secondary | ICD-10-CM | POA: Diagnosis not present

## 2014-12-07 DIAGNOSIS — M47812 Spondylosis without myelopathy or radiculopathy, cervical region: Secondary | ICD-10-CM | POA: Diagnosis not present

## 2014-12-07 DIAGNOSIS — M5030 Other cervical disc degeneration, unspecified cervical region: Secondary | ICD-10-CM | POA: Diagnosis not present

## 2014-12-07 DIAGNOSIS — G8929 Other chronic pain: Secondary | ICD-10-CM | POA: Diagnosis not present

## 2014-12-07 DIAGNOSIS — F329 Major depressive disorder, single episode, unspecified: Secondary | ICD-10-CM | POA: Diagnosis not present

## 2014-12-07 DIAGNOSIS — E119 Type 2 diabetes mellitus without complications: Secondary | ICD-10-CM | POA: Diagnosis not present

## 2014-12-07 DIAGNOSIS — M5136 Other intervertebral disc degeneration, lumbar region: Secondary | ICD-10-CM | POA: Diagnosis not present

## 2014-12-07 DIAGNOSIS — Z79899 Other long term (current) drug therapy: Secondary | ICD-10-CM | POA: Diagnosis not present

## 2014-12-07 DIAGNOSIS — Z885 Allergy status to narcotic agent status: Secondary | ICD-10-CM | POA: Diagnosis not present

## 2014-12-07 DIAGNOSIS — E785 Hyperlipidemia, unspecified: Secondary | ICD-10-CM | POA: Diagnosis not present

## 2014-12-07 DIAGNOSIS — M47816 Spondylosis without myelopathy or radiculopathy, lumbar region: Secondary | ICD-10-CM | POA: Diagnosis not present

## 2014-12-12 ENCOUNTER — Other Ambulatory Visit: Payer: Self-pay | Admitting: Family Medicine

## 2014-12-12 DIAGNOSIS — F419 Anxiety disorder, unspecified: Secondary | ICD-10-CM | POA: Insufficient documentation

## 2014-12-13 ENCOUNTER — Other Ambulatory Visit: Payer: Self-pay | Admitting: Family Medicine

## 2014-12-13 NOTE — Telephone Encounter (Signed)
Pt contacted office for refill request on the following medications:  oxyCODONE (ROXICODONE) 15 MG.  CB#[phone]/MJ

## 2014-12-14 ENCOUNTER — Ambulatory Visit: Payer: Medicare Other | Admitting: Anesthesiology

## 2014-12-14 ENCOUNTER — Encounter: Payer: Self-pay | Admitting: *Deleted

## 2014-12-14 ENCOUNTER — Encounter
Admission: RE | Disposition: A | Discharge status: Home / Self Care | Payer: Self-pay | Source: Ambulatory Visit | Attending: Podiatry

## 2014-12-14 ENCOUNTER — Ambulatory Visit
Admission: RE | Admit: 2014-12-14 | Discharge: 2014-12-14 | Disposition: A | Discharge status: Home / Self Care | Payer: Medicare Other | Source: Ambulatory Visit | Attending: Podiatry | Admitting: Podiatry

## 2014-12-14 DIAGNOSIS — M2030 Hallux varus (acquired), unspecified foot: Secondary | ICD-10-CM | POA: Diagnosis present

## 2014-12-14 DIAGNOSIS — M2012 Hallux valgus (acquired), left foot: Secondary | ICD-10-CM | POA: Diagnosis not present

## 2014-12-14 DIAGNOSIS — M2042 Other hammer toe(s) (acquired), left foot: Secondary | ICD-10-CM | POA: Insufficient documentation

## 2014-12-14 DIAGNOSIS — M2040 Other hammer toe(s) (acquired), unspecified foot: Secondary | ICD-10-CM | POA: Diagnosis not present

## 2014-12-14 DIAGNOSIS — M2032 Hallux varus (acquired), left foot: Secondary | ICD-10-CM | POA: Insufficient documentation

## 2014-12-14 DIAGNOSIS — Z885 Allergy status to narcotic agent status: Secondary | ICD-10-CM | POA: Insufficient documentation

## 2014-12-14 HISTORY — DX: Anxiety disorder, unspecified: F41.9

## 2014-12-14 HISTORY — DX: Nausea with vomiting, unspecified: R11.2

## 2014-12-14 HISTORY — DX: Dorsalgia, unspecified: M54.9

## 2014-12-14 HISTORY — PX: ARTHRODESIS METATARSALPHALANGEAL JOINT (MTPJ): SHX6566

## 2014-12-14 HISTORY — DX: Pure hypercholesterolemia, unspecified: E78.00

## 2014-12-14 HISTORY — DX: Other specified postprocedural states: Z98.890

## 2014-12-14 HISTORY — DX: Polycystic ovarian syndrome: E28.2

## 2014-12-14 SURGERY — FUSION, JOINT, GREAT TOE
Anesthesia: General | Laterality: Left | Wound class: Clean

## 2014-12-14 MED ORDER — LACTATED RINGERS IV SOLN
INTRAVENOUS | Status: DC
  Administered 2014-12-14: 07:00:00 via INTRAVENOUS

## 2014-12-14 MED ORDER — KETAMINE HCL 100 MG/ML IJ SOLN
INTRAMUSCULAR | Status: DC | PRN
  Administered 2014-12-14: 50 mg via INTRAVENOUS

## 2014-12-14 MED ORDER — MORPHINE SULFATE 15 MG PO TABS: 15.0000 mg | ORAL_TABLET | ORAL | Status: DC | PRN

## 2014-12-14 MED ORDER — ONDANSETRON HCL 4 MG/2ML IJ SOLN
INTRAMUSCULAR | Status: DC | PRN
  Administered 2014-12-14: 4 mg via INTRAVENOUS

## 2014-12-14 MED ORDER — OXYCODONE HCL 15 MG PO TABS: ORAL_TABLET | ORAL | Status: DC

## 2014-12-14 MED ORDER — CEFAZOLIN SODIUM-DEXTROSE 2-3 GM-% IV SOLR
2.0000 g | Freq: Once | INTRAVENOUS | Status: AC
  Administered 2014-12-14: 2 g via INTRAVENOUS

## 2014-12-14 MED ORDER — HYDROMORPHONE HCL 1 MG/ML IJ SOLN: 0.2500 mg | INTRAMUSCULAR | Status: DC | PRN

## 2014-12-14 MED ORDER — FENTANYL CITRATE (PF) 100 MCG/2ML IJ SOLN
INTRAMUSCULAR | Status: DC | PRN
  Administered 2014-12-14: 100 ug via INTRAVENOUS

## 2014-12-14 MED ORDER — ACETAMINOPHEN 325 MG PO TABS: 325.0000 mg | ORAL_TABLET | ORAL | Status: DC | PRN

## 2014-12-14 MED ORDER — ACETAMINOPHEN 160 MG/5ML PO SOLN: 325.0000 mg | ORAL | Status: DC | PRN

## 2014-12-14 MED ORDER — BUPIVACAINE HCL (PF) 0.5 % IJ SOLN
INTRAMUSCULAR | Status: DC | PRN
  Administered 2014-12-14: 9 mL

## 2014-12-14 MED ORDER — ONDANSETRON HCL 4 MG/2ML IJ SOLN: 4.0000 mg | Freq: Once | INTRAMUSCULAR | Status: DC | PRN

## 2014-12-14 MED ORDER — MIDAZOLAM HCL 5 MG/5ML IJ SOLN
INTRAMUSCULAR | Status: DC | PRN
  Administered 2014-12-14: 2 mg via INTRAVENOUS

## 2014-12-14 MED ORDER — SCOPOLAMINE 1 MG/3DAYS TD PT72
1.0000 | MEDICATED_PATCH | TRANSDERMAL | Status: DC
  Administered 2014-12-14: 1.5 mg via TRANSDERMAL

## 2014-12-14 MED ORDER — BUPIVACAINE HCL (PF) 0.5 % IJ SOLN
INTRAMUSCULAR | Status: DC | PRN
  Administered 2014-12-14: 10 mL

## 2014-12-14 MED ORDER — LIDOCAINE HCL (CARDIAC) 20 MG/ML IV SOLN
INTRAVENOUS | Status: DC | PRN
  Administered 2014-12-14: 40 mg via INTRATRACHEAL

## 2014-12-14 MED ORDER — PROPOFOL 10 MG/ML IV BOLUS
INTRAVENOUS | Status: DC | PRN
  Administered 2014-12-14: 150 mg via INTRAVENOUS

## 2014-12-14 MED ORDER — OXYCODONE HCL 5 MG PO TABS: 5.0000 mg | ORAL_TABLET | Freq: Once | ORAL | Status: DC | PRN

## 2014-12-14 MED ORDER — SCOPOLAMINE 1 MG/3DAYS TD PT72: 1.0000 | MEDICATED_PATCH | TRANSDERMAL | Status: DC

## 2014-12-14 MED ORDER — DEXAMETHASONE SODIUM PHOSPHATE 4 MG/ML IJ SOLN
INTRAMUSCULAR | Status: DC | PRN
  Administered 2014-12-14: 8 mg via INTRAVENOUS

## 2014-12-14 MED ORDER — MEPERIDINE HCL 25 MG/ML IJ SOLN: 6.2500 mg | INTRAMUSCULAR | Status: DC | PRN

## 2014-12-14 MED ORDER — OXYCODONE HCL 5 MG/5ML PO SOLN: 5.0000 mg | Freq: Once | ORAL | Status: DC | PRN

## 2014-12-14 SURGICAL SUPPLY — 87 items
APL SKNCLS STERI-STRIP NONHPOA (GAUZE/BANDAGES/DRESSINGS) ×1
BANDAGE ELASTIC 4 CLIP NS LF (GAUZE/BANDAGES/DRESSINGS) ×2 IMPLANT
BENZOIN TINCTURE PRP APPL 2/3 (GAUZE/BANDAGES/DRESSINGS) ×2 IMPLANT
BLADE CRESCENTIC (BLADE) ×1 IMPLANT
BLADE MED AGGRESSIVE (BLADE) IMPLANT
BLADE MINI RND TIP GREEN BEAV (BLADE) IMPLANT
BLADE OSC/SAGITTAL 5.5X25 (BLADE) IMPLANT
BLADE OSC/SAGITTAL MD 5.5X18 (BLADE) IMPLANT
BLADE OSC/SAGITTAL MD 9X18.5 (BLADE) ×1 IMPLANT
BLADE OSCILLATING/SAGITTAL (BLADE)
BLADE SURG 15 STRL LF DISP TIS (BLADE) IMPLANT
BLADE SURG 15 STRL SS (BLADE) ×2
BLADE SW THK.38XMED LNG THN (BLADE) IMPLANT
BNDG CMPR 75X41 PLY HI ABS (GAUZE/BANDAGES/DRESSINGS) ×1
BNDG ESMARK 4X12 TAN STRL LF (GAUZE/BANDAGES/DRESSINGS) ×2 IMPLANT
BNDG GAUZE 4.5X4.1 6PLY STRL (MISCELLANEOUS) ×2 IMPLANT
BNDG STRETCH 4X75 STRL LF (GAUZE/BANDAGES/DRESSINGS) ×2 IMPLANT
BUR STRYKR EGG 5.0 (BURR) IMPLANT
BUR SURG 4X8 MED (BURR) IMPLANT
BURR SURG 4X8 MED (BURR) ×2
CANISTER SUCT 1200ML W/VALVE (MISCELLANEOUS) ×2 IMPLANT
CAST PADDING 3X4FT ST 30246 (SOFTGOODS) ×2
CORD BIP STRL DISP 12FT (MISCELLANEOUS) ×1 IMPLANT
COVER PIN YLW 0.028-062 (MISCELLANEOUS) IMPLANT
CUFF TOURN SGL QUICK 18 (TOURNIQUET CUFF) ×1 IMPLANT
DRAPE FLUOR MINI C-ARM 54X84 (DRAPES) ×2 IMPLANT
DRILL WIRE PASS (DRILL) IMPLANT
DURAPREP 26ML APPLICATOR (WOUND CARE) ×2 IMPLANT
ETHIBOND 2 0 GREEN CT 2 30IN (SUTURE) IMPLANT
FINISTRATION DRILL BIT ×1 IMPLANT
GAUZE PETRO XEROFOAM 1X8 (MISCELLANEOUS) ×2 IMPLANT
GAUZE PETRO XEROFOAM 5X9 (MISCELLANEOUS) IMPLANT
GAUZE SPONGE 4X4 12PLY STRL (GAUZE/BANDAGES/DRESSINGS) ×2 IMPLANT
GLOVE BIO SURGEON STRL SZ7.5 (GLOVE) ×2 IMPLANT
GLOVE BIO SURGEON STRL SZ8 (GLOVE) ×3 IMPLANT
GLOVE INDICATOR 8.0 STRL GRN (GLOVE) ×4 IMPLANT
GOWN STRL REUS W/ TWL XL LVL3 (GOWN DISPOSABLE) ×2 IMPLANT
GOWN STRL REUS W/TWL XL LVL3 (GOWN DISPOSABLE) ×6
K-WIRE DBL END TROCAR 6X.045 (WIRE)
K-WIRE DBL END TROCAR 6X.062 (WIRE)
KWIRE DBL END TROCAR 6X.045 (WIRE) IMPLANT
KWIRE DBL END TROCAR 6X.062 (WIRE) IMPLANT
NDL HYPO 18GX1.5 BLUNT FILL (NEEDLE) IMPLANT
NDL HYPO 25GX1X1/2 BEV (NEEDLE) IMPLANT
NEEDLE HYPO 18GX1.5 BLUNT FILL (NEEDLE) ×2 IMPLANT
NEEDLE HYPO 25GX1X1/2 BEV (NEEDLE) ×2 IMPLANT
PACK EXTREMITY ARMC (MISCELLANEOUS) ×2 IMPLANT
PAD CAST CTTN 3X4 STRL (SOFTGOODS) IMPLANT
PAD GROUND ADULT SPLIT (MISCELLANEOUS) ×1 IMPLANT
PADDING CAST COTTON 3X4 STRL (SOFTGOODS) ×2
PARAGON 28----2.1 DRILL BIT ×1 IMPLANT
PARAGON 28----3.0 HEADED COUNTERSINK ×1 IMPLANT
PARAGON 28---1.6 K-WIRE ×1 IMPLANT
PARAGON 28---19MM CUP REAMER ×1 IMPLANT
PARAGON 28---3 IN 1 CENTER PUNCH ×1 IMPLANT
PARAGON 28---K-WIRE ×4 IMPLANT
PLATE 5 HOLE STRAIGHT (Plate) ×1 IMPLANT
RASP SM TEAR CROSS CUT (RASP) ×2 IMPLANT
SCREW 2.7X15 (Screw) IMPLANT
SCREW 2.7X8 (Screw) IMPLANT
SCREW 3.0X28 (Screw) ×2 IMPLANT
SCREW 3.0X30 (Screw) ×1 IMPLANT
SCREW BN ST 28X3.5XCANN (Screw) IMPLANT
SCREW LOCKING 3.5X14 (Screw) ×1 IMPLANT
SCREW LOCKING 3.5X16 (Screw) ×1 IMPLANT
SCREW NON-LOCKING 3.5X12 (Screw) ×1 IMPLANT
SCREW NON-LOCKING 3.5X16 (Screw) ×1 IMPLANT
SPLINT CAST 1 STEP 4X30 (MISCELLANEOUS) ×2 IMPLANT
SPLINT FAST PLASTER 5X30 (CAST SUPPLIES) ×1
SPLINT PLASTER CAST FAST 5X30 (CAST SUPPLIES) IMPLANT
STOCKINETTE STRL 6IN 960660 (GAUZE/BANDAGES/DRESSINGS) ×2 IMPLANT
STRIP CLOSURE SKIN 1/4X4 (GAUZE/BANDAGES/DRESSINGS) ×2 IMPLANT
SUT ETHILON 4-0 (SUTURE)
SUT ETHILON 4-0 FS2 18XMFL BLK (SUTURE)
SUT ETHILON 5-0 FS-2 18 BLK (SUTURE) IMPLANT
SUT VIC AB 1 CT1 36 (SUTURE) IMPLANT
SUT VIC AB 2-0 CT1 27 (SUTURE)
SUT VIC AB 2-0 CT1 TAPERPNT 27 (SUTURE) IMPLANT
SUT VIC AB 2-0 SH 27 (SUTURE) ×2
SUT VIC AB 2-0 SH 27XBRD (SUTURE) IMPLANT
SUT VIC AB 3-0 SH 27 (SUTURE)
SUT VIC AB 3-0 SH 27X BRD (SUTURE) IMPLANT
SUT VIC AB 4-0 FS2 27 (SUTURE) ×1 IMPLANT
SUT VICRYL AB 3-0 FS1 BRD 27IN (SUTURE) IMPLANT
SUTURE ETHLN 4-0 FS2 18XMF BLK (SUTURE) IMPLANT
SYRINGE 10CC LL (SYRINGE) ×1 IMPLANT
WATER STERILE IRR 1000ML POUR (IV SOLUTION) ×1 IMPLANT

## 2014-12-14 NOTE — Transfer of Care (Signed)
Immediate Anesthesia Transfer of Care Note  Patient: Courtney Ross  Procedure(s) Performed: Procedure(s) with comments: ARTHRODESIS METATARSALPHALANGEAL JOINT (MTPJ) 1ST (Left) - LMA  Patient Location: PACU  Anesthesia Type: General LMA  Level of Consciousness: awake, alert  and patient cooperative  Airway and Oxygen Therapy: Patient Spontanous Breathing and Patient connected to supplemental oxygen  Post-op Assessment: Post-op Vital signs reviewed, Patient's Cardiovascular Status Stable, Respiratory Function Stable, Patent Airway and No signs of Nausea or vomiting  Post-op Vital Signs: Reviewed and stable  Complications: No apparent anesthesia complications

## 2014-12-14 NOTE — Anesthesia Procedure Notes (Addendum)
Procedure Name: LMA Insertion Date/Time: 12/14/2014 7:49 AM Performed by: Londell Moh Pre-anesthesia Checklist: Patient identified, Emergency Drugs available, Suction available, Timeout performed and Patient being monitored Patient Re-evaluated:Patient Re-evaluated prior to inductionOxygen Delivery Method: Circle system utilized Preoxygenation: Pre-oxygenation with 100% oxygen Intubation Type: IV induction LMA: LMA inserted LMA Size: 4.0 Number of attempts: 1 Placement Confirmation: positive ETCO2 and breath sounds checked- equal and bilateral Tube secured with: Tape

## 2014-12-14 NOTE — Anesthesia Preprocedure Evaluation (Signed)
Anesthesia Evaluation  Patient identified by MRN, date of birth, ID band Patient awake    Reviewed: Allergy & Precautions, H&P , NPO status , Patient's Chart, lab work & pertinent test results, reviewed documented beta blocker date and time   History of Anesthesia Complications (+) PONV and history of anesthetic complications  Airway Mallampati: II  TM Distance: >3 FB Neck ROM: full    Dental no notable dental hx.    Pulmonary neg pulmonary ROS,  breath sounds clear to auscultation  Pulmonary exam normal       Cardiovascular Exercise Tolerance: Good negative cardio ROS  Rhythm:regular Rate:Normal     Neuro/Psych Chronic pain negative psych ROS   GI/Hepatic negative GI ROS, Neg liver ROS,   Endo/Other  negative endocrine ROS  Renal/GU negative Renal ROS  negative genitourinary   Musculoskeletal   Abdominal   Peds  Hematology negative hematology ROS (+)   Anesthesia Other Findings   Reproductive/Obstetrics negative OB ROS                             Anesthesia Physical Anesthesia Plan  ASA: II  Anesthesia Plan: General LMA   Post-op Pain Management:    Induction:   Airway Management Planned:   Additional Equipment:   Intra-op Plan:   Post-operative Plan:   Informed Consent: I have reviewed the patients History and Physical, chart, labs and discussed the procedure including the risks, benefits and alternatives for the proposed anesthesia with the patient or authorized representative who has indicated his/her understanding and acceptance.     Plan Discussed with: CRNA  Anesthesia Plan Comments:         Anesthesia Quick Evaluation

## 2014-12-14 NOTE — Discharge Instructions (Signed)
[facility] °[facility] ° °POST OPERATIVE INSTRUCTIONS FOR DR. TROXLER AND DR. FOWLER °[facility] ° ° °1. Take your medication as prescribed.  Pain medication should be taken only as needed. ° °2. Keep the dressing clean, dry and intact. ° °3. Keep your foot elevated above the heart level for the first 48 hours. ° °4. Walking to the bathroom and brief periods of walking are acceptable, unless we have instructed you to be non-weight bearing. ° °5. Always wear your post-op shoe when walking.  Always use your crutches if you are to be non-weight bearing. ° °6. Do not take a shower. Baths are permissible as long as the foot is kept out of the water.  ° °7. Every hour you are awake:  °- Bend your knee 15 times. °- Flex foot 15 times °- Massage calf 15 times ° °8. Call [facility] ([phone]) if any of the following problems occur: °- You develop a temperature or fever. °- The bandage becomes saturated with blood. °- Medication does not stop your pain. °- Injury of the foot occurs. °- Any symptoms of infection including redness, odor, or red streaks running from wound. °-  ° °General Anesthesia, Care After °Refer to this sheet in the next few weeks. These instructions provide you with information on caring for yourself after your procedure. Your health care provider may also give you more specific instructions. Your treatment has been planned according to current medical practices, but problems sometimes occur. Call your health care provider if you have any problems or questions after your procedure. °WHAT TO EXPECT AFTER THE PROCEDURE °After the procedure, it is typical to experience: °· Sleepiness. °· Nausea and vomiting. °HOME CARE INSTRUCTIONS °· For the first 24 hours after general anesthesia: °¨ Have a responsible person with you. °¨ Do not drive a car. If you are alone, do not take public transportation. °¨ Do not drink alcohol. °¨ Do not take medicine  that has not been prescribed by your health care provider. °¨ Do not sign important papers or make important decisions. °¨ You may resume a normal diet and activities as directed by your health care provider. °· Change bandages (dressings) as directed. °· If you have questions or problems that seem related to general anesthesia, call the hospital and ask for the anesthetist or anesthesiologist on call. °SEEK MEDICAL CARE IF: °· You have nausea and vomiting that continue the day after anesthesia. °· You develop a rash. °SEEK IMMEDIATE MEDICAL CARE IF:  °· You have difficulty breathing. °· You have chest pain. °· You have any allergic problems. °Document Released: 09/15/2000 Document Revised: 06/14/2013 Document Reviewed: 12/23/2012 °ExitCare® Patient Information ©2015 [employer]. This information is not intended to replace advice given to you by your health care provider. Make sure you discuss any questions you have with your health care provider. ° °

## 2014-12-14 NOTE — Op Note (Signed)
Operative note   Surgeon: Dr. Albertine Patricia, DPM.    Assistant: None    Preop diagnosis: #1. Hallux varus deformity left foot                                     2. Hallux malleus left foot    Postop diagnosis: Same    Procedure:   1. First metatarsal phalangeal joint arthrodesis left foot          EBL: Less than 10 cc    Anesthesia:general with local    Hemostasis: Ankle tourniquet 250 mmHg pressure    Specimen: None    Complications: None    Operative indications: Pronounced deformity with contracture of both the first metatarsal phalangeal joint and the interphalangeal joint left foot great toe    Procedure:  Patient was brought into the OR and placed on the operating table in thesupine position. After anesthesia was obtained theleft lower extremity was prepped and draped in usual sterile fashion.  Operative Report: This time to his directed to the dorsum of the first metatarsophalangeal joint of the left foot. The previous scar line was noted and incised through this old scar in a linear fashion. This was extended to the IP joint of the hallux. Incision was deepened sharp blunt dissection bleeders clamped and bovied as required. The extensor tendon was retracted laterally and incision made through the old scar tissue and Tissue down to bone. This point a 15 blade was utilized to free the scar tissue away from the first metatarsophalangeal joint metatarsal head and base of the proximal phalanx. Extensive scar tissue was noted. Once these areas were freed the proximal phalanx articular cartilage had to be resected utilizing a crescentic blade. The articular cartilage was removed and then a reamer was used to remove articular cartilage from the first metatarsal head. At this time the area was inspected and the plantar shelf of bone was identified and resected utilizing Rogers and a sagittal saw. Once this was removed the proximal phalanx was position to a rectus alignment with the  first metatarsal head and temporarily fixated. There is checked FluoroScan good position and correction were noted. Once satisfactory position was noted to Crossing screws were placed across the metatarsophalangeal joint using the Paragon 28 implants these included 230 screws one was 20 mm and the distal screw was 30 mm. This point a Paragon 25 hole plate was placed across the insight with 4 screws 2 distal to proximal to the fusion site. Distally 3.512 mm and 3.514 mm locking screw was used. The distal screw was nonlocking. Proximal screws included a 3.5 x 16 locking and 3.5 x 16 nonlocking. These areas were checked FluoroScan and good position and correction were noted. At this time the wound was copiously irrigated and capsular tissues were closed with 4 Vicryl in a continuous stitch. Deep and superficial fascial layers were closed in similar fashion. Skin reapproximated with 4 Vicryl subcuticular stitch.   At this time a sterile compressive dressing was placed across the wound consisting of Steri-Strips Xeroform gauze 4 x 4's Kling and Kerlix the tourniquet was released and complete and prompt vascularity seen to return all digits of the left foot. A posterior splint was placed on the left foot and leg in the operating room. The patient is to remain nonweightbearing for this.     Patient tolerated the procedure and anesthesia well.  Was transported from  the OR to the PACU with all vital signs stable and vascular status intact. To be discharged per routine protocol.  Will follow up in approximately 1 week in the outpatient clinic.

## 2014-12-14 NOTE — Anesthesia Postprocedure Evaluation (Signed)
  Anesthesia Post-op Note  Patient: Courtney Ross  Procedure(s) Performed: Procedure(s) with comments: ARTHRODESIS METATARSALPHALANGEAL JOINT (MTPJ) 1ST (Left) - LMA  Anesthesia type:General LMA  Patient location: PACU  Post pain: Pain level controlled  Post assessment: Post-op Vital signs reviewed, Patient's Cardiovascular Status Stable, Respiratory Function Stable, Patent Airway and No signs of Nausea or vomiting  Post vital signs: Reviewed and stable  Last Vitals:  Filed Vitals:   12/14/14 1000  BP: 135/77  Pulse: 88  Temp:   Resp: 18    Level of consciousness: awake, alert  and patient cooperative  Complications: No apparent anesthesia complications

## 2014-12-15 ENCOUNTER — Other Ambulatory Visit: Payer: Self-pay | Admitting: *Deleted

## 2014-12-15 NOTE — Telephone Encounter (Signed)
Request for alprazolam is still pending.

## 2014-12-18 ENCOUNTER — Encounter: Payer: Self-pay | Admitting: Podiatry

## 2014-12-20 DIAGNOSIS — M2142 Flat foot [pes planus] (acquired), left foot: Secondary | ICD-10-CM | POA: Diagnosis not present

## 2014-12-20 NOTE — Telephone Encounter (Signed)
Spoke with patient and she states she takes them both. Patient was advised that she is not supposed to be on both, only one or the other. Patient prefers to be on the Diazepam.

## 2014-12-20 NOTE — Telephone Encounter (Signed)
Please contact patient about alprazolam and diazepam. She is only supposed to be taking one or the other, not both. Please see which one she want's to continue. Thanks.

## 2015-01-03 DIAGNOSIS — M5030 Other cervical disc degeneration, unspecified cervical region: Secondary | ICD-10-CM | POA: Diagnosis not present

## 2015-01-03 DIAGNOSIS — M5136 Other intervertebral disc degeneration, lumbar region: Secondary | ICD-10-CM | POA: Diagnosis not present

## 2015-01-03 DIAGNOSIS — M47816 Spondylosis without myelopathy or radiculopathy, lumbar region: Secondary | ICD-10-CM | POA: Diagnosis not present

## 2015-01-03 DIAGNOSIS — M47812 Spondylosis without myelopathy or radiculopathy, cervical region: Secondary | ICD-10-CM | POA: Diagnosis not present

## 2015-01-04 ENCOUNTER — Telehealth: Payer: Self-pay | Admitting: Family Medicine

## 2015-01-04 NOTE — Telephone Encounter (Signed)
oxyCODONE (ROXICODONE) 15 MG immediate release tablet 12/14/14 -- Courtney Sons, MD 1-2 four times a day as needed

## 2015-01-08 ENCOUNTER — Other Ambulatory Visit: Payer: Self-pay | Admitting: Family Medicine

## 2015-01-08 MED ORDER — OXYCODONE HCL 15 MG PO TABS: ORAL_TABLET | ORAL | Status: DC

## 2015-01-08 NOTE — Telephone Encounter (Signed)
oxyCODONE (ROXICODONE) 15 MG immediate release tablet Pt needs to pick up refill RX.  Thanks TP

## 2015-01-08 NOTE — Telephone Encounter (Signed)
This is complete.

## 2015-01-10 DIAGNOSIS — M79672 Pain in left foot: Secondary | ICD-10-CM | POA: Diagnosis not present

## 2015-01-11 ENCOUNTER — Other Ambulatory Visit: Payer: Self-pay | Admitting: Family Medicine

## 2015-01-11 NOTE — Telephone Encounter (Signed)
Prescription called in to pharmacy

## 2015-01-11 NOTE — Telephone Encounter (Signed)
Please call in the following medication.   [facility] [phone]   Surescripts Out Interface  17 minutes ago   (1:08 PM)         New medications from outside sources are available for reconciliation.       Requested Medications     Medication name:  Name from pharmacy:  LYRICA 75 MG capsule LYRICA 75MG  CAP    Sig: TAKE ONE CAPSULE THREE TIMES A DAY    Dispense: 90 capsule (Pharmacy requested 90 each)   Start: 01/11/2015    RF x 5 Class: Normal    Requested on: 06/19/2014

## 2015-01-23 DIAGNOSIS — M47812 Spondylosis without myelopathy or radiculopathy, cervical region: Secondary | ICD-10-CM | POA: Diagnosis not present

## 2015-01-23 DIAGNOSIS — M47816 Spondylosis without myelopathy or radiculopathy, lumbar region: Secondary | ICD-10-CM | POA: Diagnosis not present

## 2015-01-24 DIAGNOSIS — M79672 Pain in left foot: Secondary | ICD-10-CM | POA: Diagnosis not present

## 2015-01-24 DIAGNOSIS — M2042 Other hammer toe(s) (acquired), left foot: Secondary | ICD-10-CM | POA: Diagnosis not present

## 2015-01-30 ENCOUNTER — Telehealth: Payer: Self-pay | Admitting: Family Medicine

## 2015-01-30 NOTE — Telephone Encounter (Signed)
oxyCODONE (ROXICODONE) 15 MG immediate release tablet 01/08/15 -- Birdie Sons, MD 1-2 four times a day as needed     [employer]

## 2015-01-30 NOTE — Telephone Encounter (Signed)
Please review. Thanks!  

## 2015-01-31 MED ORDER — OXYCODONE HCL 15 MG PO TABS: ORAL_TABLET | ORAL | Status: DC

## 2015-02-21 ENCOUNTER — Other Ambulatory Visit: Payer: Self-pay | Admitting: Family Medicine

## 2015-02-21 NOTE — Telephone Encounter (Signed)
Pt contacted office for refill request on the following medications: °oxyCODONE (ROXICODONE) 15 MG immediate release tablet Thanks TNP °

## 2015-02-21 NOTE — Telephone Encounter (Signed)
Last ov was on 10/26/2014  Thanks

## 2015-02-22 ENCOUNTER — Other Ambulatory Visit: Payer: Self-pay | Admitting: Family Medicine

## 2015-02-22 MED ORDER — OXYCODONE HCL 15 MG PO TABS: ORAL_TABLET | ORAL | Status: DC

## 2015-03-16 ENCOUNTER — Encounter: Payer: Self-pay | Admitting: *Deleted

## 2015-03-19 ENCOUNTER — Other Ambulatory Visit: Payer: Self-pay | Admitting: Family Medicine

## 2015-03-19 DIAGNOSIS — M2042 Other hammer toe(s) (acquired), left foot: Secondary | ICD-10-CM | POA: Diagnosis not present

## 2015-03-19 NOTE — Telephone Encounter (Signed)
Pt contacted office for refill request on the following medications: ° °oxyCODONE (ROXICODONE) 15 MG immediate release tablet ° °CB#[phone]/MW °

## 2015-03-20 MED ORDER — OXYCODONE HCL 15 MG PO TABS: ORAL_TABLET | ORAL | Status: DC

## 2015-03-20 NOTE — Discharge Instructions (Signed)
[facility] °[facility] ° °POST OPERATIVE INSTRUCTIONS FOR DR. TROXLER AND DR. FOWLER °[facility] ° ° °1. Take your medication as prescribed.  Pain medication should be taken only as needed. ° °2. Keep the dressing clean, dry and intact. ° °3. Keep your foot elevated above the heart level for the first 48 hours. ° °4. Walking to the bathroom and brief periods of walking are acceptable, unless we have instructed you to be non-weight bearing. ° °5. Always wear your post-op shoe when walking.  Always use your crutches if you are to be non-weight bearing. ° °6. Do not take a shower. Baths are permissible as long as the foot is kept out of the water.  ° °7. Every hour you are awake:  °- Bend your knee 15 times. °- Flex foot 15 times °- Massage calf 15 times ° °8. Call [facility] ([phone]) if any of the following problems occur: °- You develop a temperature or fever. °- The bandage becomes saturated with blood. °- Medication does not stop your pain. °- Injury of the foot occurs. °- Any symptoms of infection including redness, odor, or red streaks running from wound. °-  ° °General Anesthesia, Care After °Refer to this sheet in the next few weeks. These instructions provide you with information on caring for yourself after your procedure. Your health care provider may also give you more specific instructions. Your treatment has been planned according to current medical practices, but problems sometimes occur. Call your health care provider if you have any problems or questions after your procedure. °WHAT TO EXPECT AFTER THE PROCEDURE °After the procedure, it is typical to experience: °· Sleepiness. °· Nausea and vomiting. °HOME CARE INSTRUCTIONS °· For the first 24 hours after general anesthesia: °¨ Have a responsible person with you. °¨ Do not drive a car. If you are alone, do not take public transportation. °¨ Do not drink alcohol. °¨ Do not take medicine  that has not been prescribed by your health care provider. °¨ Do not sign important papers or make important decisions. °¨ You may resume a normal diet and activities as directed by your health care provider. °· Change bandages (dressings) as directed. °· If you have questions or problems that seem related to general anesthesia, call the hospital and ask for the anesthetist or anesthesiologist on call. °SEEK MEDICAL CARE IF: °· You have nausea and vomiting that continue the day after anesthesia. °· You develop a rash. °SEEK IMMEDIATE MEDICAL CARE IF:  °· You have difficulty breathing. °· You have chest pain. °· You have any allergic problems. °Document Released: 09/15/2000 Document Revised: 06/14/2013 Document Reviewed: 12/23/2012 °ExitCare® Patient Information ©2015 [employer]. This information is not intended to replace advice given to you by your health care provider. Make sure you discuss any questions you have with your health care provider. ° °

## 2015-03-22 ENCOUNTER — Ambulatory Visit: Payer: Medicare Other | Admitting: Anesthesiology

## 2015-03-22 ENCOUNTER — Encounter
Admission: RE | Disposition: A | Discharge status: Home / Self Care | Payer: Self-pay | Source: Ambulatory Visit | Attending: Podiatry

## 2015-03-22 ENCOUNTER — Ambulatory Visit
Admission: RE | Admit: 2015-03-22 | Discharge: 2015-03-22 | Disposition: A | Discharge status: Home / Self Care | Payer: Medicare Other | Source: Ambulatory Visit | Attending: Podiatry | Admitting: Podiatry

## 2015-03-22 DIAGNOSIS — Z79899 Other long term (current) drug therapy: Secondary | ICD-10-CM | POA: Insufficient documentation

## 2015-03-22 DIAGNOSIS — D6862 Lupus anticoagulant syndrome: Secondary | ICD-10-CM | POA: Diagnosis not present

## 2015-03-22 DIAGNOSIS — K760 Fatty (change of) liver, not elsewhere classified: Secondary | ICD-10-CM | POA: Insufficient documentation

## 2015-03-22 DIAGNOSIS — D649 Anemia, unspecified: Secondary | ICD-10-CM | POA: Insufficient documentation

## 2015-03-22 DIAGNOSIS — E669 Obesity, unspecified: Secondary | ICD-10-CM | POA: Insufficient documentation

## 2015-03-22 DIAGNOSIS — Z791 Long term (current) use of non-steroidal anti-inflammatories (NSAID): Secondary | ICD-10-CM | POA: Insufficient documentation

## 2015-03-22 DIAGNOSIS — G43909 Migraine, unspecified, not intractable, without status migrainosus: Secondary | ICD-10-CM | POA: Diagnosis not present

## 2015-03-22 DIAGNOSIS — Z90710 Acquired absence of both cervix and uterus: Secondary | ICD-10-CM | POA: Insufficient documentation

## 2015-03-22 DIAGNOSIS — G894 Chronic pain syndrome: Secondary | ICD-10-CM | POA: Diagnosis not present

## 2015-03-22 DIAGNOSIS — Z981 Arthrodesis status: Secondary | ICD-10-CM | POA: Diagnosis not present

## 2015-03-22 DIAGNOSIS — Z79891 Long term (current) use of opiate analgesic: Secondary | ICD-10-CM | POA: Insufficient documentation

## 2015-03-22 DIAGNOSIS — M2042 Other hammer toe(s) (acquired), left foot: Secondary | ICD-10-CM | POA: Diagnosis not present

## 2015-03-22 DIAGNOSIS — E785 Hyperlipidemia, unspecified: Secondary | ICD-10-CM | POA: Insufficient documentation

## 2015-03-22 DIAGNOSIS — M1990 Unspecified osteoarthritis, unspecified site: Secondary | ICD-10-CM | POA: Insufficient documentation

## 2015-03-22 DIAGNOSIS — E038 Other specified hypothyroidism: Secondary | ICD-10-CM | POA: Insufficient documentation

## 2015-03-22 DIAGNOSIS — Z9049 Acquired absence of other specified parts of digestive tract: Secondary | ICD-10-CM | POA: Insufficient documentation

## 2015-03-22 DIAGNOSIS — Z6830 Body mass index (BMI) 30.0-30.9, adult: Secondary | ICD-10-CM | POA: Diagnosis not present

## 2015-03-22 DIAGNOSIS — G2581 Restless legs syndrome: Secondary | ICD-10-CM | POA: Diagnosis not present

## 2015-03-22 DIAGNOSIS — Z86010 Personal history of colonic polyps: Secondary | ICD-10-CM | POA: Insufficient documentation

## 2015-03-22 DIAGNOSIS — Z885 Allergy status to narcotic agent status: Secondary | ICD-10-CM | POA: Insufficient documentation

## 2015-03-22 DIAGNOSIS — Z9889 Other specified postprocedural states: Secondary | ICD-10-CM | POA: Diagnosis not present

## 2015-03-22 DIAGNOSIS — F329 Major depressive disorder, single episode, unspecified: Secondary | ICD-10-CM | POA: Diagnosis not present

## 2015-03-22 HISTORY — DX: Restless legs syndrome: G25.81

## 2015-03-22 HISTORY — DX: Headache, unspecified: R51.9

## 2015-03-22 HISTORY — DX: Headache: R51

## 2015-03-22 HISTORY — DX: Depression, unspecified: F32.A

## 2015-03-22 HISTORY — PX: HAMMER TOE SURGERY: SHX385

## 2015-03-22 HISTORY — DX: Other intervertebral disc degeneration, lumbar region without mention of lumbar back pain or lower extremity pain: M51.369

## 2015-03-22 HISTORY — DX: Fatty (change of) liver, not elsewhere classified: K76.0

## 2015-03-22 HISTORY — DX: Major depressive disorder, single episode, unspecified: F32.9

## 2015-03-22 HISTORY — DX: Other intervertebral disc degeneration, lumbar region: M51.36

## 2015-03-22 SURGERY — CORRECTION, HAMMER TOE
Anesthesia: Monitor Anesthesia Care | Laterality: Left | Wound class: Clean

## 2015-03-22 MED ORDER — LIDOCAINE HCL (CARDIAC) 20 MG/ML IV SOLN
INTRAVENOUS | Status: DC | PRN
  Administered 2015-03-22: 40 mg via INTRAVENOUS

## 2015-03-22 MED ORDER — MIDAZOLAM HCL 5 MG/5ML IJ SOLN
INTRAMUSCULAR | Status: DC | PRN
  Administered 2015-03-22: 2 mg via INTRAVENOUS

## 2015-03-22 MED ORDER — LACTATED RINGERS IV SOLN
INTRAVENOUS | Status: DC
  Administered 2015-03-22: 08:00:00 via INTRAVENOUS

## 2015-03-22 MED ORDER — BUPIVACAINE HCL (PF) 0.5 % IJ SOLN
INTRAMUSCULAR | Status: DC | PRN
  Administered 2015-03-22: 1 mL
  Administered 2015-03-22: 2 mL

## 2015-03-22 MED ORDER — CEFAZOLIN SODIUM-DEXTROSE 2-3 GM-% IV SOLR
2.0000 g | Freq: Once | INTRAVENOUS | Status: AC
  Administered 2015-03-22: 2 g via INTRAVENOUS

## 2015-03-22 MED ORDER — OXYCODONE HCL 5 MG/5ML PO SOLN: 5.0000 mg | Freq: Once | ORAL | Status: DC | PRN

## 2015-03-22 MED ORDER — OXYCODONE HCL 5 MG PO TABS: 5.0000 mg | ORAL_TABLET | Freq: Once | ORAL | Status: DC | PRN

## 2015-03-22 MED ORDER — PROPOFOL 10 MG/ML IV BOLUS: INTRAVENOUS | Status: DC | PRN

## 2015-03-22 MED ORDER — FENTANYL CITRATE (PF) 100 MCG/2ML IJ SOLN: INTRAMUSCULAR | Status: DC | PRN

## 2015-03-22 MED ORDER — PROPOFOL 500 MG/50ML IV EMUL
INTRAVENOUS | Status: DC | PRN
  Administered 2015-03-22: 100 ug/kg/min via INTRAVENOUS

## 2015-03-22 MED ORDER — DEXAMETHASONE SODIUM PHOSPHATE 4 MG/ML IJ SOLN: INTRAMUSCULAR | Status: DC | PRN

## 2015-03-22 MED ORDER — FENTANYL CITRATE (PF) 100 MCG/2ML IJ SOLN
INTRAMUSCULAR | Status: DC | PRN
  Administered 2015-03-22 (×2): 50 ug via INTRAVENOUS

## 2015-03-22 MED ORDER — PROPOFOL 10 MG/ML IV BOLUS
INTRAVENOUS | Status: DC | PRN
  Administered 2015-03-22: 40 mg via INTRAVENOUS

## 2015-03-22 MED ORDER — FENTANYL CITRATE (PF) 100 MCG/2ML IJ SOLN: 25.0000 ug | INTRAMUSCULAR | Status: DC | PRN

## 2015-03-22 MED ORDER — ONDANSETRON HCL 4 MG/2ML IJ SOLN: INTRAMUSCULAR | Status: DC | PRN

## 2015-03-22 MED ORDER — LIDOCAINE HCL (CARDIAC) 20 MG/ML IV SOLN: INTRAVENOUS | Status: DC | PRN

## 2015-03-22 SURGICAL SUPPLY — 68 items
APL SKNCLS STERI-STRIP NONHPOA (GAUZE/BANDAGES/DRESSINGS) ×1
BANDAGE ELASTIC 4 CLIP NS LF (GAUZE/BANDAGES/DRESSINGS) ×2 IMPLANT
BENZOIN TINCTURE PRP APPL 2/3 (GAUZE/BANDAGES/DRESSINGS) ×2 IMPLANT
BLADE CRESCENTIC (BLADE) IMPLANT
BLADE MED AGGRESSIVE (BLADE) IMPLANT
BLADE MINI RND TIP GREEN BEAV (BLADE) ×1 IMPLANT
BLADE OSC/SAGITTAL 5.5X25 (BLADE) IMPLANT
BLADE OSC/SAGITTAL MD 5.5X18 (BLADE) ×1 IMPLANT
BLADE OSC/SAGITTAL MD 9X18.5 (BLADE) IMPLANT
BLADE OSCILLATING/SAGITTAL (BLADE)
BLADE SURG 15 STRL LF DISP TIS (BLADE) IMPLANT
BLADE SURG 15 STRL SS (BLADE)
BLADE SW THK.38XMED LNG THN (BLADE) IMPLANT
BNDG CMPR 75X41 PLY HI ABS (GAUZE/BANDAGES/DRESSINGS) ×1
BNDG ESMARK 4X12 TAN STRL LF (GAUZE/BANDAGES/DRESSINGS) ×2 IMPLANT
BNDG GAUZE 4.5X4.1 6PLY STRL (MISCELLANEOUS) ×2 IMPLANT
BNDG STRETCH 4X75 STRL LF (GAUZE/BANDAGES/DRESSINGS) ×2 IMPLANT
BUR EGG 4X8 MED (BURR) IMPLANT
BUR STRYKR EGG 5.0 (BURR) IMPLANT
CANISTER SUCT 1200ML W/VALVE (MISCELLANEOUS) ×2 IMPLANT
CAST PADDING 3X4FT ST 30246 (SOFTGOODS)
COVER PIN YLW 0.028-062 (MISCELLANEOUS) ×2 IMPLANT
CUFF TOURN SGL QUICK 18 (TOURNIQUET CUFF) ×1 IMPLANT
DRAPE FLUOR MINI C-ARM 54X84 (DRAPES) ×2 IMPLANT
DRILL WIRE PASS (DRILL) IMPLANT
DURAPREP 26ML APPLICATOR (WOUND CARE) ×2 IMPLANT
ETHIBOND 2 0 GREEN CT 2 30IN (SUTURE) IMPLANT
GAUZE PETRO XEROFOAM 1X8 (MISCELLANEOUS) ×2 IMPLANT
GAUZE PETRO XEROFOAM 5X9 (MISCELLANEOUS) IMPLANT
GAUZE SPONGE 4X4 12PLY STRL (GAUZE/BANDAGES/DRESSINGS) ×2 IMPLANT
GLOVE BIO SURGEON STRL SZ7.5 (GLOVE) ×2 IMPLANT
GLOVE BIO SURGEON STRL SZ8 (GLOVE) ×2 IMPLANT
GLOVE INDICATOR 8.0 STRL GRN (GLOVE) ×2 IMPLANT
GOWN STRL REUS W/ TWL XL LVL3 (GOWN DISPOSABLE) ×2 IMPLANT
GOWN STRL REUS W/TWL XL LVL3 (GOWN DISPOSABLE) ×4
K-WIRE DBL END TROCAR 6X.045 (WIRE) ×4
K-WIRE DBL END TROCAR 6X.062 (WIRE)
KWIRE DBL END TROCAR 6X.045 (WIRE) IMPLANT
KWIRE DBL END TROCAR 6X.062 (WIRE) IMPLANT
NDL HYPO 18GX1.5 BLUNT FILL (NEEDLE) IMPLANT
NDL HYPO 25GX1X1/2 BEV (NEEDLE) IMPLANT
NEEDLE HYPO 18GX1.5 BLUNT FILL (NEEDLE) ×2 IMPLANT
NEEDLE HYPO 25GX1X1/2 BEV (NEEDLE) IMPLANT
PACK EXTREMITY ARMC (MISCELLANEOUS) ×2 IMPLANT
PAD CAST CTTN 3X4 STRL (SOFTGOODS) IMPLANT
PAD GROUND ADULT SPLIT (MISCELLANEOUS) ×2 IMPLANT
PADDING CAST COTTON 3X4 STRL (SOFTGOODS)
RASP SM TEAR CROSS CUT (RASP) ×2 IMPLANT
SPLINT CAST 1 STEP 4X30 (MISCELLANEOUS) ×2 IMPLANT
SPLINT FAST PLASTER 5X30 (CAST SUPPLIES)
SPLINT PLASTER CAST FAST 5X30 (CAST SUPPLIES) IMPLANT
STOCKINETTE STRL 6IN 960660 (GAUZE/BANDAGES/DRESSINGS) ×2 IMPLANT
STRIP CLOSURE SKIN 1/4X4 (GAUZE/BANDAGES/DRESSINGS) ×2 IMPLANT
SUT ETHILON 4-0 (SUTURE)
SUT ETHILON 4-0 FS2 18XMFL BLK (SUTURE)
SUT ETHILON 5-0 FS-2 18 BLK (SUTURE) ×1 IMPLANT
SUT VIC AB 1 CT1 36 (SUTURE) IMPLANT
SUT VIC AB 2-0 CT1 27 (SUTURE)
SUT VIC AB 2-0 CT1 TAPERPNT 27 (SUTURE) IMPLANT
SUT VIC AB 2-0 SH 27 (SUTURE)
SUT VIC AB 2-0 SH 27XBRD (SUTURE) IMPLANT
SUT VIC AB 3-0 SH 27 (SUTURE)
SUT VIC AB 3-0 SH 27X BRD (SUTURE) IMPLANT
SUT VIC AB 4-0 FS2 27 (SUTURE) IMPLANT
SUT VICRYL AB 3-0 FS1 BRD 27IN (SUTURE) IMPLANT
SUTURE ETHLN 4-0 FS2 18XMF BLK (SUTURE) IMPLANT
SYRINGE 10CC LL (SYRINGE) IMPLANT
k-Wire ×2 IMPLANT

## 2015-03-22 NOTE — Anesthesia Preprocedure Evaluation (Signed)
Anesthesia Evaluation   Patient awake    Reviewed: Allergy & Precautions, H&P , Patient's Chart, lab work & pertinent test results  History of Anesthesia Complications (+) PONV and history of anesthetic complications  Airway Mallampati: II  TM Distance: >3 FB Neck ROM: full    Dental  (+) Edentulous Upper   Pulmonary neg pulmonary ROS, former smoker,    Pulmonary exam normal        Cardiovascular negative cardio ROS Normal cardiovascular exam     Neuro/Psych  Headaches, PSYCHIATRIC DISORDERS    GI/Hepatic negative GI ROS, Neg liver ROS,   Endo/Other  negative endocrine ROS  Renal/GU      Musculoskeletal   Abdominal   Peds  Hematology negative hematology ROS (+)   Anesthesia Other Findings   Reproductive/Obstetrics                             Anesthesia Physical Anesthesia Plan  ASA: II  Anesthesia Plan: MAC   Post-op Pain Management:    Induction:   Airway Management Planned:   Additional Equipment:   Intra-op Plan:   Post-operative Plan:   Informed Consent: I have reviewed the patients History and Physical, chart, labs and discussed the procedure including the risks, benefits and alternatives for the proposed anesthesia with the patient or authorized representative who has indicated his/her understanding and acceptance.     Plan Discussed with: CRNA  Anesthesia Plan Comments:         Anesthesia Quick Evaluation

## 2015-03-22 NOTE — Anesthesia Procedure Notes (Signed)
Procedure Name: MAC Date/Time: 03/22/2015 7:37 AM Performed by: Cameron Ali Pre-anesthesia Checklist: Patient identified, Emergency Drugs available, Suction available, Patient being monitored and Timeout performed Patient Re-evaluated:Patient Re-evaluated prior to inductionOxygen Delivery Method: Simple face mask Placement Confirmation: positive ETCO2 and breath sounds checked- equal and bilateral

## 2015-03-22 NOTE — Transfer of Care (Signed)
Immediate Anesthesia Transfer of Care Note  Patient: Courtney Ross  Procedure(s) Performed: Procedure(s) with comments: HAMMER TOE CORRECTION L 2ND AND 3RD  (Left) - WITH LOCAL  Patient Location: PACU  Anesthesia Type: MAC  Level of Consciousness: awake, alert  and patient cooperative  Airway and Oxygen Therapy: Patient Spontanous Breathing and Patient connected to supplemental oxygen  Post-op Assessment: Post-op Vital signs reviewed, Patient's Cardiovascular Status Stable, Respiratory Function Stable, Patent Airway and No signs of Nausea or vomiting  Post-op Vital Signs: Reviewed and stable  Complications: No apparent anesthesia complications

## 2015-03-22 NOTE — Anesthesia Postprocedure Evaluation (Signed)
  Anesthesia Post-op Note  Patient: Courtney Ross  Procedure(s) Performed: Procedure(s) with comments: HAMMER TOE CORRECTION L 2ND AND 3RD  (Left) - WITH LOCAL  Anesthesia type:MAC  Patient location: PACU  Post pain: Pain level controlled  Post assessment: Post-op Vital signs reviewed, Patient's Cardiovascular Status Stable, Respiratory Function Stable, Patent Airway and No signs of Nausea or vomiting  Post vital signs: Reviewed and stable  Last Vitals:  Filed Vitals:   03/22/15 0907  BP: 122/71  Pulse: 79  Temp:   Resp: 18    Level of consciousness: awake, alert  and patient cooperative  Complications: No apparent anesthesia complications

## 2015-03-22 NOTE — Op Note (Signed)
Operative note   Surgeon: Dr. Albertine Patricia, DPM.    Assistant: None    Preop diagnosis: Contracted hammertoes second third toes left foot    Postop diagnosis: Same    Procedure:   1. Removal of head of the middle phalanx second toe left foot with K wire fixation.   2. Removal of head of middle phalanx third toe left foot with K wire fixation.      EBL: Less than 5 cc    Anesthesia:IV sedation with local    Hemostasis: Ankle tourniquet 250 mmHg pressure    Specimen: None    Complications: None    Operative indications: Chronic pain and deformity resistant to conservative care    Procedure:  Patient was brought into the OR and placed on the operating table in thesupine position. After anesthesia was obtained theleft lower extremity was prepped and draped in usual sterile fashion.  Operative Report: Attention was directed this time to the second toe of the left foot where 2 similar incisions were made transversely across the DIPJ. This ellipse of skin was then removed and the extensor tendon was identified and incised transversely and reflected proximally. At this time the head of the middle phalanx was identified and resected with a sagittal saw. There is an copiously irrigated and a 0.045 K wire was drilled through the distal phalanx and retrograded into the middle phalanx and proximal phalanx. Proximal interphalangeal joints and then fused at the point earlier. This time there is irrigated once again and the extensor tendon was then reapproximated with 2 simple interrupted 4. 0 Vicryl sutures. Skin was enclosed with 2 simple interrupted sutures mediolaterally and centrally a mattress suture was used to pickup both skin and deep tendon structures were greater security.  At this time and this was directed to the third toe of the left foot where identical procedure was performed as that on the second toe.  At this time both ears are blocked 0.5% Marcaine plain and sterile  compressive dressing was placed across the wounds consisting of Xeroform gauze 4 x 4's Kling and Kerlix. The tourniquet was released and prompt complete vascularity seen to return all digits of the left foot.    Patient tolerated the procedure and anesthesia well.  Was transported from the OR to the PACU with all vital signs stable and vascular status intact. To be discharged per routine protocol.  Will follow up in approximately 1 week in the outpatient clinic.

## 2015-03-22 NOTE — Anesthesia Postprocedure Evaluation (Signed)
  Anesthesia Post-op Note  Patient: Courtney Ross  Procedure(s) Performed: Procedure(s) with comments: HAMMER TOE CORRECTION L 2ND AND 3RD  (Left) - WITH LOCAL  Anesthesia type:MAC  Patient location: PACU  Post pain: Pain level controlled  Post assessment: Post-op Vital signs reviewed, Patient's Cardiovascular Status Stable, Respiratory Function Stable, Patent Airway and No signs of Nausea or vomiting  Post vital signs: Reviewed and stable  Last Vitals:  Filed Vitals:   03/22/15 0658  BP: 149/67  Pulse: 91  Temp: 36.6 C  Resp: 14    Level of consciousness: awake, alert  and patient cooperative  Complications: No apparent anesthesia complications

## 2015-03-28 DIAGNOSIS — Z9889 Other specified postprocedural states: Secondary | ICD-10-CM | POA: Diagnosis not present

## 2015-03-28 DIAGNOSIS — M2040 Other hammer toe(s) (acquired), unspecified foot: Secondary | ICD-10-CM | POA: Diagnosis not present

## 2015-03-28 DIAGNOSIS — M79672 Pain in left foot: Secondary | ICD-10-CM | POA: Diagnosis not present

## 2015-04-10 ENCOUNTER — Other Ambulatory Visit: Payer: Self-pay | Admitting: Family Medicine

## 2015-04-10 ENCOUNTER — Other Ambulatory Visit: Payer: Self-pay | Admitting: *Deleted

## 2015-04-10 MED ORDER — DIAZEPAM 10 MG PO TABS: 10.0000 mg | ORAL_TABLET | Freq: Four times a day (QID) | ORAL | Status: DC | PRN

## 2015-04-10 MED ORDER — OXYCODONE HCL 15 MG PO TABS: ORAL_TABLET | ORAL | Status: DC

## 2015-04-10 NOTE — Telephone Encounter (Signed)
Pt contacted office for refill request on the following medications:  oxyCODONE (ROXICODONE) 15 MG immediate release tablet.  CB#[phone]

## 2015-04-10 NOTE — Telephone Encounter (Signed)
Pharmacy stated that the patient usually gets a quantity of #120 instead of #30. Please advise?

## 2015-04-10 NOTE — Telephone Encounter (Signed)
Please call in diazepam.  

## 2015-04-10 NOTE — Telephone Encounter (Signed)
Last ov was on 11/06/14. Last refill for this medication was on 03/20/2015.  Thanks,

## 2015-04-12 DIAGNOSIS — Z23 Encounter for immunization: Secondary | ICD-10-CM | POA: Diagnosis not present

## 2015-04-26 ENCOUNTER — Other Ambulatory Visit: Payer: Self-pay | Admitting: Family Medicine

## 2015-04-26 MED ORDER — DIAZEPAM 10 MG PO TABS: 10.0000 mg | ORAL_TABLET | Freq: Four times a day (QID) | ORAL | Status: DC | PRN

## 2015-04-26 NOTE — Telephone Encounter (Signed)
I called [facility] and advised Kennyth Lose as below.

## 2015-04-26 NOTE — Telephone Encounter (Signed)
OK to change quantity to #120 with 5 refills.

## 2015-05-03 ENCOUNTER — Other Ambulatory Visit: Payer: Self-pay

## 2015-05-03 NOTE — Telephone Encounter (Signed)
Patient is requesting a refill on Oxycodone 15 mg. CB # [phone]

## 2015-05-04 MED ORDER — OXYCODONE HCL 15 MG PO TABS: ORAL_TABLET | ORAL | Status: DC

## 2015-05-07 ENCOUNTER — Ambulatory Visit: Payer: Medicare Other | Admitting: Pain Medicine

## 2015-05-08 ENCOUNTER — Ambulatory Visit: Payer: Medicare Other | Attending: Pain Medicine | Admitting: Pain Medicine

## 2015-05-08 ENCOUNTER — Encounter: Payer: Self-pay | Admitting: Pain Medicine

## 2015-05-08 VITALS — BP 137/92 | HR 101 | Temp 99.5°F | Resp 16 | Ht 68.0 in | Wt 190.0 lb

## 2015-05-08 DIAGNOSIS — M545 Low back pain, unspecified: Secondary | ICD-10-CM

## 2015-05-08 DIAGNOSIS — Z969 Presence of functional implant, unspecified: Secondary | ICD-10-CM

## 2015-05-08 DIAGNOSIS — Z9889 Other specified postprocedural states: Secondary | ICD-10-CM | POA: Insufficient documentation

## 2015-05-08 DIAGNOSIS — Z9689 Presence of other specified functional implants: Secondary | ICD-10-CM | POA: Insufficient documentation

## 2015-05-08 DIAGNOSIS — IMO0002 Reserved for concepts with insufficient information to code with codable children: Secondary | ICD-10-CM | POA: Insufficient documentation

## 2015-05-08 DIAGNOSIS — R1031 Right lower quadrant pain: Secondary | ICD-10-CM | POA: Insufficient documentation

## 2015-05-08 DIAGNOSIS — M79606 Pain in leg, unspecified: Secondary | ICD-10-CM | POA: Diagnosis not present

## 2015-05-08 DIAGNOSIS — M961 Postlaminectomy syndrome, not elsewhere classified: Secondary | ICD-10-CM

## 2015-05-08 DIAGNOSIS — M778 Other enthesopathies, not elsewhere classified: Secondary | ICD-10-CM | POA: Insufficient documentation

## 2015-05-08 DIAGNOSIS — M5136 Other intervertebral disc degeneration, lumbar region: Secondary | ICD-10-CM | POA: Diagnosis not present

## 2015-05-08 DIAGNOSIS — M79676 Pain in unspecified toe(s): Secondary | ICD-10-CM | POA: Insufficient documentation

## 2015-05-08 DIAGNOSIS — Z462 Encounter for fitting and adjustment of other devices related to nervous system and special senses: Secondary | ICD-10-CM

## 2015-05-08 DIAGNOSIS — F329 Major depressive disorder, single episode, unspecified: Secondary | ICD-10-CM | POA: Diagnosis not present

## 2015-05-08 DIAGNOSIS — F419 Anxiety disorder, unspecified: Secondary | ICD-10-CM | POA: Diagnosis not present

## 2015-05-08 DIAGNOSIS — E7801 Familial hypercholesterolemia: Secondary | ICD-10-CM | POA: Diagnosis not present

## 2015-05-08 DIAGNOSIS — R51 Headache: Secondary | ICD-10-CM | POA: Diagnosis not present

## 2015-05-08 DIAGNOSIS — M75100 Unspecified rotator cuff tear or rupture of unspecified shoulder, not specified as traumatic: Secondary | ICD-10-CM | POA: Insufficient documentation

## 2015-05-08 DIAGNOSIS — M539 Dorsopathy, unspecified: Secondary | ICD-10-CM | POA: Diagnosis not present

## 2015-05-08 DIAGNOSIS — G43909 Migraine, unspecified, not intractable, without status migrainosus: Secondary | ICD-10-CM

## 2015-05-08 DIAGNOSIS — G8929 Other chronic pain: Secondary | ICD-10-CM | POA: Insufficient documentation

## 2015-05-08 DIAGNOSIS — E039 Hypothyroidism, unspecified: Secondary | ICD-10-CM | POA: Diagnosis not present

## 2015-05-08 DIAGNOSIS — M5416 Radiculopathy, lumbar region: Secondary | ICD-10-CM

## 2015-05-08 DIAGNOSIS — G2581 Restless legs syndrome: Secondary | ICD-10-CM | POA: Diagnosis not present

## 2015-05-08 DIAGNOSIS — M779 Enthesopathy, unspecified: Secondary | ICD-10-CM

## 2015-05-08 DIAGNOSIS — Z86010 Personal history of colonic polyps: Secondary | ICD-10-CM | POA: Insufficient documentation

## 2015-05-08 DIAGNOSIS — Z4542 Encounter for adjustment and management of neuropacemaker (brain) (peripheral nerve) (spinal cord): Secondary | ICD-10-CM

## 2015-05-08 DIAGNOSIS — T85192A Other mechanical complication of implanted electronic neurostimulator (electrode) of spinal cord, initial encounter: Secondary | ICD-10-CM

## 2015-05-08 DIAGNOSIS — R109 Unspecified abdominal pain: Secondary | ICD-10-CM

## 2015-05-08 DIAGNOSIS — R52 Pain, unspecified: Secondary | ICD-10-CM | POA: Diagnosis present

## 2015-05-08 DIAGNOSIS — K4090 Unilateral inguinal hernia, without obstruction or gangrene, not specified as recurrent: Secondary | ICD-10-CM | POA: Diagnosis not present

## 2015-05-08 DIAGNOSIS — R768 Other specified abnormal immunological findings in serum: Secondary | ICD-10-CM | POA: Insufficient documentation

## 2015-05-08 HISTORY — DX: Migraine, unspecified, not intractable, without status migrainosus: G43.909

## 2015-05-09 ENCOUNTER — Encounter: Payer: Self-pay | Admitting: Pain Medicine

## 2015-05-09 DIAGNOSIS — M47812 Spondylosis without myelopathy or radiculopathy, cervical region: Secondary | ICD-10-CM | POA: Diagnosis not present

## 2015-05-09 DIAGNOSIS — Z09 Encounter for follow-up examination after completed treatment for conditions other than malignant neoplasm: Secondary | ICD-10-CM | POA: Insufficient documentation

## 2015-05-09 DIAGNOSIS — M79606 Pain in leg, unspecified: Secondary | ICD-10-CM

## 2015-05-09 DIAGNOSIS — T859XXA Unspecified complication of internal prosthetic device, implant and graft, initial encounter: Secondary | ICD-10-CM | POA: Insufficient documentation

## 2015-05-09 DIAGNOSIS — M961 Postlaminectomy syndrome, not elsewhere classified: Secondary | ICD-10-CM | POA: Insufficient documentation

## 2015-05-09 DIAGNOSIS — Z969 Presence of functional implant, unspecified: Secondary | ICD-10-CM | POA: Insufficient documentation

## 2015-05-09 DIAGNOSIS — R1031 Right lower quadrant pain: Secondary | ICD-10-CM

## 2015-05-09 DIAGNOSIS — M5416 Radiculopathy, lumbar region: Secondary | ICD-10-CM

## 2015-05-09 DIAGNOSIS — G8929 Other chronic pain: Secondary | ICD-10-CM | POA: Insufficient documentation

## 2015-05-09 DIAGNOSIS — M47816 Spondylosis without myelopathy or radiculopathy, lumbar region: Secondary | ICD-10-CM | POA: Diagnosis not present

## 2015-05-09 NOTE — Progress Notes (Signed)
Patient's Name: Courtney Ross MRN: [id number] DOB: 04-23-1956 DOS: 05/08/2015  Primary Reason(s) for Visit: Encounter for Adjustment & Management of Neurostimulator. CC: Pain   HPI:   Courtney Ross is a 59 y.o. year old, female patient, who returns today as an established patient. 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; Major depressive disorder with single episode ([place]); Menopausal and perimenopausal disorder; Headache, migraine; Adiposity; Pain in toe; Atypical squamous cells of undetermined significance on cytologic smear of vagina (asc-us); Restless legs syndrome; Snapping thumb syndrome; Rotator cuff syndrome of shoulder and allied disorders; Bursitis, subcoracoid; Abnormal immunological findings in specimens from other organs, systems and tissues; Hand tendinitis; Failed back surgical syndrome; Chronic low back pain; Chronic pain of lower extremity (Right side); Chronic lumbar radicular pain (Right side); Chronic right groin pain; 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 Her primarily concern today is the Pain     The patient comes in today indicating that she is not able to recharge her battery. As it turns out, she indicates having had some surgery and after the surgery she did not turn on the stimulator. Because she was not using it, she also did not recharge it and she completely depleted the  battery. We have informed the patient today that the rechargeable units can usually tolerate only 2-3 complete discharges, after which they can be damaged and become nonfunctional. Today we were able to get the assistance of the [employer] representative to go in and recharge and jump start her battery. We were able to charge the battery approximately 25% and we have instructed the patient to go home and continue recharge ended up to the 100%. We will see the patient on a when necessary basis.  Today's Pain Score: 4  Reported level of pain is compatible with clinical observation Pain Type: Chronic pain Pain Location: Groin Pain Descriptors / Indicators: Burning Pain Frequency: Intermittent  Pharmacotherapy Review: The patient's medication management is currently not under our care. Side-effects or Adverse reactions: None reported. Effectiveness: Described as relatively effective, allowing for increase in activities of daily living (ADL). Onset of action: Within expected pharmacological parameters. Duration of action: Within normal limits for medication. Peak effect: Timing and results are as within normal expected parameters. [place] PMP: Compliant with practice rules and regulations. UDS: Compliant with practice rules and regulations. Lab work: No new labs ordered by our practice. Treatment compliance: Compliant. Substance Use Disorder (SUD) Risk Level: Low Planned course of action: Continue therapy as is.  Allergies: Courtney Ross is allergic to hydrocodone.  Meds: The patient has a current medication list which includes the following prescription(s): vitamin d3, vitamin b-12, cyclobenzaprine, desvenlafaxine, diazepam, estradiol, linaclotide, lyrica, metformin, oxycodone, and pravastatin. Requested Prescriptions    No prescriptions requested or ordered in this encounter    ROS: Constitutional: Afebrile, no chills, well hydrated and well nourished Gastrointestinal:  negative Musculoskeletal:negative Neurological: negative Behavioral/Psych: negative  PFSH: Medical:  Courtney Ross  has a past medical history of PONV (postoperative nausea and vomiting); Polycystic ovarian disease; Anxiety; Back pain; High cholesterol; Depression; Fatty liver; Headache; Degenerative disc disease, lumbar; Restless leg  syndrome; Headache, migraine (05/08/2015); and Spasm (01/29/2009). Family: family history is not on file. Surgical:  has past surgical history that includes Neck surgery (2006); Spinal cord stimulator implant; Ectopic pregnancy surgery (1982); Foot surgery; Arthrodesis metatarsalphalangeal joint (mtpj) (Left, 12/14/2014); Abdominal hysterectomy ( ); Tonsillectomy (1985); Appendectomy (1988); Hernia repair (1988); Back surgery [phone]); Breast surgery (2005); Colporrhaphy; Bladder suspension (2006); Carpal tunnel release (Bilateral, 02/26/07,05/21/07); and Hammer toe surgery (Left, 03/22/2015). Tobacco:  reports that she has never smoked. She has never used smokeless tobacco. Alcohol:  reports that she drinks alcohol. Drug:  reports that she does not use illicit drugs.  Physical Exam: Vitals:  Today's Vitals   05/08/15 1521 05/08/15 1522  BP: 137/92   Pulse: 101   Temp: 99.5 F (37.5 C)   TempSrc: Oral   Resp: 16   Height: 5\' 8"  (1.727 m)   Weight: 190 lb (86.183 kg)   SpO2: 98%   PainSc: 4  4   PainLoc: Groin   Calculated BMI: Body mass index is 28.9 kg/(m^2). General appearance: alert, cooperative, appears stated age, mild distress and moderately obese Eyes: conjunctivae/corneas clear. PERRL, EOM's intact. Fundi benign. Lungs: No evidence respiratory distress, no audible rales or ronchi and no use of accessory muscles of respiration Neck: no adenopathy, no carotid bruit, no JVD, supple, symmetrical, trachea midline and thyroid not enlarged, symmetric, no tenderness/mass/nodules Back: symmetric, no curvature. ROM normal. No CVA tenderness. Extremities:  extremities normal, atraumatic, no cyanosis or edema Pulses: 2+ and symmetric Skin: Skin color, texture, turgor normal. No rashes or lesions Neurologic: Grossly normal    Assessment: Encounter Diagnosis:  Primary Diagnosis: Other mechanical complication of implanted electronic neurostimulator of spinal cord, initial encounter [facility]) [T85.192A]  Plan: Shealynn was seen today for pain.  Diagnoses and all orders for this visit:  Other mechanical complication of implanted electronic neurostimulator of spinal cord, initial encounter ([place])  Failed back surgical syndrome  Chronic low back pain  Chronic pain of lower extremity, unspecified laterality  Chronic lumbar radicular pain (Right side)  Chronic right groin pain  Encounter for interrogation of neurostimulator  Presence of functional implant ([employer] rechargeable spinal cord stimulator)     There are no Patient Instructions on file for this visit. Medications discontinued today:  Medications Discontinued During This Encounter  Medication Reason  . scopolamine (TRANSDERM-SCOP) 1 MG/3DAYS Completed Course  . tiZANidine (ZANAFLEX) 4 MG tablet Discontinued by provider   Medications administered today:  Ms. Peglow had no medications administered during this visit.  Primary Care Physician: No primary care provider on file. Location: [facility] Note by: Seynabou Fults A. Dossie Arbour, M.D, DABA, DABAPM, DABPM, DABIPP, FIPP

## 2015-05-10 ENCOUNTER — Other Ambulatory Visit: Payer: Self-pay | Admitting: Family Medicine

## 2015-05-25 ENCOUNTER — Other Ambulatory Visit: Payer: Self-pay | Admitting: Family Medicine

## 2015-05-25 MED ORDER — OXYCODONE HCL 15 MG PO TABS: ORAL_TABLET | ORAL | Status: DC

## 2015-05-25 NOTE — Telephone Encounter (Signed)
Pt needs refill oxyCODONE (ROXICODONE) 15 MG immediate release tablet  Thanks, Con Memos

## 2015-05-28 ENCOUNTER — Telehealth: Payer: Self-pay | Admitting: Family Medicine

## 2015-05-28 NOTE — Telephone Encounter (Signed)
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 Patient wanted to know if this was something that we can check for in the office. Advised patient to schedule appt and we can send off lab to check for MRSA. Appt scheduled.

## 2015-05-28 NOTE — Telephone Encounter (Signed)
Pt would like to speak with a nurse about MRSA. CB# [phone] Thanks CC

## 2015-05-29 ENCOUNTER — Encounter: Payer: Self-pay | Admitting: Family Medicine

## 2015-05-29 ENCOUNTER — Ambulatory Visit (INDEPENDENT_AMBULATORY_CARE_PROVIDER_SITE_OTHER): Payer: Medicare Other | Admitting: Family Medicine

## 2015-05-29 VITALS — BP 120/110 | HR 96 | Temp 98.9°F | Resp 16 | Wt 200.0 lb

## 2015-05-29 DIAGNOSIS — M5137 Other intervertebral disc degeneration, lumbosacral region: Secondary | ICD-10-CM | POA: Diagnosis not present

## 2015-05-29 DIAGNOSIS — R21 Rash and other nonspecific skin eruption: Secondary | ICD-10-CM

## 2015-05-29 DIAGNOSIS — M5416 Radiculopathy, lumbar region: Secondary | ICD-10-CM

## 2015-05-29 DIAGNOSIS — I10 Essential (primary) hypertension: Secondary | ICD-10-CM

## 2015-05-29 DIAGNOSIS — G8929 Other chronic pain: Secondary | ICD-10-CM

## 2015-05-29 MED ORDER — DOXYCYCLINE HYCLATE 100 MG PO TABS: 100.0000 mg | ORAL_TABLET | Freq: Two times a day (BID) | ORAL | Status: AC

## 2015-05-29 MED ORDER — LISINOPRIL 10 MG PO TABS: 10.0000 mg | ORAL_TABLET | Freq: Every day | ORAL | Status: DC

## 2015-05-29 MED ORDER — MUPIROCIN 2 % EX OINT: 1.0000 "application " | TOPICAL_OINTMENT | Freq: Two times a day (BID) | CUTANEOUS | Status: DC

## 2015-05-29 NOTE — Progress Notes (Signed)
Patient: Courtney Ross Female    DOB: April 10, 1956   59 y.o.   MRN: [id number] Visit Date: 05/29/2015  Today's Provider: Lelon Huh, MD   Chief Complaint  Patient presents with  . Rash   Subjective:    HPI  Has recurring rash on bottom around panty line. Rash itches and sore. Also has sores in her nose occasionally. Started about 6 months ago. She had been caring for an ailing aunt who she has since found out had MRSA. She also states her brother recently passed away and also had MRSA  Chronic back pain She continue on oxycodone 15mg . Usually taking about 8 throughout the day. It remains effective and well tolerated. She continues regular follow up with Dr. Consuela Mimes who is managing neuro stimulator.   Elevated blood pressure She states her home blood pressures have been consistently running high. Has also been having more frequent  Headaches which seem to correlate to elevated blood pressure readings. No chest pains or shortness of breath.  BMET    Component Value Date/Time   NA 140 11/09/2014   K 4.4 11/09/2014   BUN 12 11/09/2014   CREATININE 0.8 11/09/2014      Allergies  Allergen Reactions  . Hydrocodone Itching  . Mirtazapine     Weight gain   Previous Medications   ALPRAZOLAM (XANAX) 2 MG TABLET    Take by mouth.   CHOLECALCIFEROL (VITAMIN D3) 5000 UNITS CAPS    Take 1 capsule by mouth daily.   CYANOCOBALAMIN (VITAMIN B-12) 500 MCG SUBL    Place 500 mcg under the tongue daily.   CYCLOBENZAPRINE (FLEXERIL) 10 MG TABLET    Take 10 mg by mouth 3 (three) times daily as needed for muscle spasms.   DESVENLAFAXINE (PRISTIQ) 100 MG 24 HR TABLET    Take 100 mg by mouth daily.   DIAZEPAM (VALIUM) 10 MG TABLET    Take 1 tablet (10 mg total) by mouth every 6 (six) hours as needed for anxiety.   ESTRADIOL (ESTRACE) 1 MG TABLET    Take 1 mg by mouth daily.   ESTROGENS, CONJUGATED, (PREMARIN) 0.625 MG TABLET    Take by mouth.   LINZESS 145 MCG CAPS CAPSULE    TAKE ONE  CAPSULE DAILY   LYRICA 75 MG CAPSULE    TAKE ONE CAPSULE THREE TIMES A DAY   MELOXICAM (MOBIC) 15 MG TABLET       METFORMIN (GLUCOPHAGE) 500 MG TABLET    Take 500 mg by mouth 2 (two) times daily with a meal.   MORPHINE (MSIR) 15 MG TABLET       OXYCODONE (ROXICODONE) 15 MG IMMEDIATE RELEASE TABLET    1-2 four times a day as needed   PRAVASTATIN (PRAVACHOL) 40 MG TABLET    TAKE 1 TABLET EVERY DAY   TIZANIDINE (ZANAFLEX) 4 MG TABLET    Take by mouth.    Review of Systems  Constitutional: Negative for fever, chills, appetite change and fatigue.  Respiratory: Negative for chest tightness and shortness of breath.   Cardiovascular: Negative for chest pain and palpitations.  Gastrointestinal: Negative for nausea, vomiting and abdominal pain.  Skin: Positive for rash.  Neurological: Positive for headaches. Negative for dizziness and weakness.    Social History  Substance Use Topics  . Smoking status: Never Smoker   . Smokeless tobacco: Never Used  . Alcohol Use: 0.0 oz/week    0 Glasses of wine per week  Comment: twice a year   Objective:   BP 120/110 mmHg  Pulse 96  Temp(Src) 98.9 F (37.2 C) (Oral)  Resp 16  Wt 200 lb (90.719 kg)  SpO2 96%  Physical Exam   General Appearance:    Alert, cooperative, no distress  Eyes:    PERRL, conjunctiva/corneas clear, EOM's intact       Lungs:     Clear to auscultation bilaterally, respirations unlabored  Heart:    Regular rate and rhythm  Neurologic:   Awake, alert, oriented x 3. No apparent focal neurological           defect.   Skin:   Several small, approaximately 68mm round red follicular lesions on both buttocks. Few dry scabs on both nares.        Assessment & Plan:     1. Rash MRSA exposure. Will cover for MRSA while awaiting culture resuls.  - Aerobic culture - doxycycline (VIBRA-TABS) 100 MG tablet; Take 1 tablet (100 mg total) by mouth 2 (two) times daily.  Dispense: 20 tablet; Refill: 0 - mupirocin ointment (BACTROBAN)  2 %; Place 1 application into the nose 2 (two) times daily.  Dispense: 22 g; Refill: 0  2. Essential hypertension Start ACEI. Counseled on potential side effects including angioedema, cough, and renal effects.  - lisinopril (PRINIVIL,ZESTRIL) 10 MG tablet; Take 1 tablet (10 mg total) by mouth daily.  Dispense: 30 tablet; Refill: 1  Return in 1 month  3. Chronic lumbar radicular pain (Right side) Stable on current pain medications. Continue follow up with Dr. Consuela Mimes   4. Degeneration of intervertebral disc of lumbosacral region Completed forms for [employer] parking permit.   Addressed extensive list of chronic and acute medical problems today requiring extensive time in counseling and coordination care.  Over half of this 45 minute visit were spent in counseling and coordinating care of multiple medical problems.        Lelon Huh, MD  [facility]

## 2015-06-01 LAB — AEROBIC CULTURE

## 2015-06-19 ENCOUNTER — Telehealth: Payer: Self-pay

## 2015-06-19 MED ORDER — OXYCODONE HCL 15 MG PO TABS: ORAL_TABLET | ORAL | Status: DC

## 2015-06-19 NOTE — Telephone Encounter (Signed)
Patient needs refill on Oxycodone. She is out of this medication. Please review-aa

## 2015-06-24 HISTORY — PX: FOOT SURGERY: SHX648

## 2015-06-26 ENCOUNTER — Ambulatory Visit: Payer: Medicare Other | Admitting: Family Medicine

## 2015-07-06 ENCOUNTER — Telehealth: Payer: Self-pay | Admitting: Family Medicine

## 2015-07-06 ENCOUNTER — Encounter: Payer: Self-pay | Admitting: Family Medicine

## 2015-07-10 ENCOUNTER — Encounter: Payer: Self-pay | Admitting: Family Medicine

## 2015-07-10 ENCOUNTER — Ambulatory Visit (INDEPENDENT_AMBULATORY_CARE_PROVIDER_SITE_OTHER): Payer: Medicare Other | Admitting: Family Medicine

## 2015-07-10 VITALS — BP 116/78 | HR 74 | Temp 98.4°F | Resp 16 | Wt 202.0 lb

## 2015-07-10 DIAGNOSIS — I10 Essential (primary) hypertension: Secondary | ICD-10-CM | POA: Diagnosis not present

## 2015-07-10 DIAGNOSIS — R5383 Other fatigue: Secondary | ICD-10-CM | POA: Diagnosis not present

## 2015-07-10 DIAGNOSIS — E669 Obesity, unspecified: Secondary | ICD-10-CM | POA: Diagnosis not present

## 2015-07-10 MED ORDER — CYANOCOBALAMIN 1000 MCG/ML IJ SOLN
1000.0000 ug | Freq: Once | INTRAMUSCULAR | Status: AC
  Administered 2015-07-10: 1000 ug via INTRAMUSCULAR

## 2015-07-10 MED ORDER — PHENTERMINE HCL 15 MG PO CAPS: 15.0000 mg | ORAL_CAPSULE | ORAL | Status: DC

## 2015-07-10 MED ORDER — TOPIRAMATE 50 MG PO TABS: 50.0000 mg | ORAL_TABLET | Freq: Every day | ORAL | Status: DC

## 2015-07-10 NOTE — Progress Notes (Signed)
Patient: Courtney Ross Female    DOB: Sep 12, 1955   60 y.o.   MRN: [id number] Visit Date: 07/10/2015  Today's Provider: Lelon Huh, MD   Chief Complaint  Patient presents with  . Follow-up  . Hypertension   Subjective:    HPI     Hypertension, follow-up:  BP Readings from Last 3 Encounters:  07/10/15 116/78  05/29/15 120/110  05/08/15 137/92    She was last seen for hypertension 1 months ago.  BP at that visit was 120/110. Management since that visit includes; started lisinopril 10 mg qd..She reports good compliance with treatment. She is not having side effects. none  She is exercising. She is not adherent to low salt diet.   Outside blood pressures are n/a. She is experiencing none.  Patient denies none.   Cardiovascular risk factors include none.  Use of agents associated with hypertension: none.   ----------------------------------------------------------------------  Fatigue She states that B12 shots have been helpful with energy in the past. Has been taking OTC SL B12, but they have not helped much at all. She requests a B12 shot today  She states she is having controlling her appetite and is unable to stay on diet to lose weight. She inquires about medications to help lose weight. She gets very little exercise due to chronic back pain which is exacerbated by being overweight.    Allergies  Allergen Reactions  . Hydrocodone Itching  . Mirtazapine     Weight gain   Previous Medications   CHOLECALCIFEROL (VITAMIN D3) 5000 UNITS CAPS    Take 1 capsule by mouth daily.   CYANOCOBALAMIN (VITAMIN B-12) 500 MCG SUBL    Place 500 mcg under the tongue daily.   CYCLOBENZAPRINE (FLEXERIL) 10 MG TABLET    Take 10 mg by mouth 3 (three) times daily as needed for muscle spasms.   DESVENLAFAXINE (PRISTIQ) 100 MG 24 HR TABLET    Take 100 mg by mouth daily.   DIAZEPAM (VALIUM) 10 MG TABLET    Take 1 tablet (10 mg total) by mouth every 6 (six) hours as needed  for anxiety.   ESTRADIOL (ESTRACE) 1 MG TABLET    Take 1 mg by mouth daily.   LINZESS 145 MCG CAPS CAPSULE    TAKE ONE CAPSULE DAILY   LISINOPRIL (PRINIVIL,ZESTRIL) 10 MG TABLET    Take 1 tablet (10 mg total) by mouth daily.   LYRICA 75 MG CAPSULE    TAKE ONE CAPSULE THREE TIMES A DAY   MELOXICAM (MOBIC) 15 MG TABLET       METFORMIN (GLUCOPHAGE) 500 MG TABLET    Take 500 mg by mouth 2 (two) times daily with a meal.   MUPIROCIN OINTMENT (BACTROBAN) 2 %    Place 1 application into the nose 2 (two) times daily.   OXYCODONE (ROXICODONE) 15 MG IMMEDIATE RELEASE TABLET    1-2 four times a day as needed   PRAVASTATIN (PRAVACHOL) 40 MG TABLET    TAKE 1 TABLET EVERY DAY    Review of Systems  Constitutional: Negative for fever, chills, appetite change and fatigue.  Respiratory: Positive for shortness of breath. Negative for chest tightness.   Cardiovascular: Negative for chest pain and palpitations.  Gastrointestinal: Negative for nausea, vomiting and abdominal pain.  Neurological: Negative for dizziness and weakness.    Social History  Substance Use Topics  . Smoking status: Never Smoker   . Smokeless tobacco: Never Used  . Alcohol Use: 0.0 oz/week  0 Glasses of wine per week     Comment: twice a year   Objective:   BP 116/78 mmHg  Pulse 74  Temp(Src) 98.4 F (36.9 C) (Oral)  Resp 16  Wt 202 lb (91.627 kg)  Physical Exam  General Appearance:    Alert, cooperative, no distress, obese  Eyes:    PERRL, conjunctiva/corneas clear, EOM's intact       Lungs:     Clear to auscultation bilaterally, respirations unlabored  Heart:    Regular rate and rhythm  Neurologic:   Awake, alert, oriented x 3. No apparent focal neurological           defect.          Assessment & Plan:     1. Essential hypertension Much better since starting lisinopril which she is tolerating well - Renal function panel  2. Other fatigue She requests another B12 shot today - cyanocobalamin ((VITAMIN B-12))  injection 1,000 mcg; Inject 1 mL (1,000 mcg total) into the muscle once.  3. Obesity Discussed diet and exercise, and risk/benefits of several prescription weight loss medications.  - phentermine 15 MG capsule; Take 1 capsule (15 mg total) by mouth every morning.  Dispense: 30 capsule; Refill: 1 - topiramate (TOPAMAX) 50 MG tablet; Take 1 tablet (50 mg total) by mouth daily.  Dispense: 30 tablet; Refill: 1       Lelon Huh, MD  [facility]

## 2015-07-11 ENCOUNTER — Telehealth: Payer: Self-pay

## 2015-07-11 ENCOUNTER — Other Ambulatory Visit: Payer: Self-pay | Admitting: Family Medicine

## 2015-07-11 DIAGNOSIS — I10 Essential (primary) hypertension: Secondary | ICD-10-CM

## 2015-07-11 LAB — RENAL FUNCTION PANEL
Albumin: 4.1 g/dL (ref 3.6–4.8)
BUN/Creatinine Ratio: 16 (ref 11–26)
BUN: 13 mg/dL (ref 8–27)
CO2: 24 mmol/L (ref 18–29)
Calcium: 9.6 mg/dL (ref 8.7–10.3)
Chloride: 100 mmol/L (ref 96–106)
Creatinine, Ser: 0.8 mg/dL (ref 0.57–1.00)
GFR calc Af Amer: 93 mL/min/{1.73_m2} (ref >59)
GFR calc non Af Amer: 80 mL/min/{1.73_m2} (ref >59)
Glucose: 88 mg/dL (ref 65–99)
Phosphorus: 3.7 mg/dL (ref 2.5–4.5)
Potassium: 4.6 mmol/L (ref 3.5–5.2)
Sodium: 139 mmol/L (ref 134–144)

## 2015-07-11 MED ORDER — OXYCODONE HCL 15 MG PO TABS: ORAL_TABLET | ORAL | Status: DC

## 2015-07-11 MED ORDER — LISINOPRIL 10 MG PO TABS: 10.0000 mg | ORAL_TABLET | Freq: Every day | ORAL | Status: DC

## 2015-07-11 NOTE — Telephone Encounter (Signed)
Patient advised.

## 2015-07-11 NOTE — Telephone Encounter (Signed)
Dr. Caryn Section, this patient's labs are reviewed but there is no note attached. Could you verify instructions for patient? Thanks!

## 2015-07-11 NOTE — Telephone Encounter (Signed)
Labs were normal. i sent a Mychart message, but i don't know I to see the message in Epic. She needs to continue same medications and follow up in 6 months.

## 2015-07-11 NOTE — Telephone Encounter (Signed)
Pt contacted office for refill request on the following medications: oxyCODONE (ROXICODONE) 15 MG immediate release tablet. Last filled on 06/19/15. Pt stated it gets filled every 23 days. Thanks TNP

## 2015-07-17 NOTE — Telephone Encounter (Signed)
error 

## 2015-07-25 DIAGNOSIS — M47812 Spondylosis without myelopathy or radiculopathy, cervical region: Secondary | ICD-10-CM | POA: Diagnosis not present

## 2015-07-25 DIAGNOSIS — M47816 Spondylosis without myelopathy or radiculopathy, lumbar region: Secondary | ICD-10-CM | POA: Diagnosis not present

## 2015-08-03 ENCOUNTER — Other Ambulatory Visit: Payer: Self-pay | Admitting: Family Medicine

## 2015-08-03 MED ORDER — OXYCODONE HCL 15 MG PO TABS: ORAL_TABLET | ORAL | Status: DC

## 2015-08-03 NOTE — Telephone Encounter (Signed)
Pt contacted office for refill request on the following medications:  HYDROcodone-acetaminophen (NORCO/VICODIN) 5-325 MG tablet.  CB#[phone]/MW

## 2015-08-07 ENCOUNTER — Other Ambulatory Visit: Payer: Self-pay | Admitting: Family Medicine

## 2015-08-07 MED ORDER — METFORMIN HCL 500 MG PO TABS: 500.0000 mg | ORAL_TABLET | Freq: Two times a day (BID) | ORAL | Status: DC

## 2015-08-22 ENCOUNTER — Other Ambulatory Visit: Payer: Self-pay | Admitting: Family Medicine

## 2015-08-22 MED ORDER — OXYCODONE HCL 15 MG PO TABS: ORAL_TABLET | ORAL | Status: DC

## 2015-08-24 ENCOUNTER — Telehealth: Payer: Self-pay

## 2015-08-24 NOTE — Telephone Encounter (Signed)
Spoke with pharmacist from [facility]-he states that we faxed over form with it stating that quantity limit for her Oxycodone is 180 tablets per 30 days. Pharmacist states patient is requesting 240 per 30 days because it looks like she is taking 8 tablets instead of 6 tablets a day, on the RX it states no more than 6 tablets daily. Pharmacist will decline request for patient for #240 and said patient may call unhappy about this. I spoke with Dr. Caryn Section and he said if she is taking more than 6 tablets a day this will need PA for the insurance and in that case patient needs an office visit to discuss the regimen of use of this medication if she feels that she needs to take more than suggested. -aa

## 2015-08-27 ENCOUNTER — Other Ambulatory Visit: Payer: Self-pay | Admitting: Family Medicine

## 2015-08-27 NOTE — Telephone Encounter (Signed)
Please call in Lyrica

## 2015-08-27 NOTE — Telephone Encounter (Signed)
Pt needs refill ° °oxyCODONE (ROXICODONE) 15 MG immediate release tablet ° °Thanks teri ° ° °

## 2015-08-27 NOTE — Telephone Encounter (Signed)
Done. Prescription phoned into pharmacy.  

## 2015-08-28 ENCOUNTER — Other Ambulatory Visit: Payer: Self-pay | Admitting: Family Medicine

## 2015-08-28 DIAGNOSIS — I10 Essential (primary) hypertension: Secondary | ICD-10-CM

## 2015-08-28 MED ORDER — LISINOPRIL 10 MG PO TABS: 10.0000 mg | ORAL_TABLET | Freq: Every day | ORAL | Status: DC

## 2015-08-29 ENCOUNTER — Encounter: Payer: Self-pay | Admitting: Family Medicine

## 2015-08-29 MED ORDER — OXYCODONE HCL 15 MG PO TABS: 15.0000 mg | ORAL_TABLET | ORAL | Status: DC | PRN

## 2015-08-29 NOTE — Telephone Encounter (Signed)
Patient advised and verbally voiced understanding. Prescription left up front for pick up.  

## 2015-08-29 NOTE — Telephone Encounter (Signed)
Patient called to check on this-aa

## 2015-08-29 NOTE — Telephone Encounter (Signed)
Tried calling patient. Left message to call back. Prescription will be on [facility] nurse desk until patient has been advised.

## 2015-08-29 NOTE — Telephone Encounter (Signed)
Rx printed. Please advise patient that insurance will cover 180 tablets per month. She cannot take more than 6 tablets in a day.

## 2015-09-04 ENCOUNTER — Other Ambulatory Visit: Payer: Self-pay | Admitting: Neurosurgery

## 2015-09-04 DIAGNOSIS — M47812 Spondylosis without myelopathy or radiculopathy, cervical region: Secondary | ICD-10-CM

## 2015-09-14 ENCOUNTER — Ambulatory Visit
Admission: RE | Admit: 2015-09-14 | Discharge: 2015-09-14 | Disposition: A | Discharge status: Home / Self Care | Payer: Medicare Other | Source: Ambulatory Visit | Attending: Neurosurgery | Admitting: Neurosurgery

## 2015-09-14 ENCOUNTER — Other Ambulatory Visit: Payer: Self-pay | Admitting: Family Medicine

## 2015-09-14 DIAGNOSIS — M47812 Spondylosis without myelopathy or radiculopathy, cervical region: Secondary | ICD-10-CM

## 2015-09-21 ENCOUNTER — Ambulatory Visit (INDEPENDENT_AMBULATORY_CARE_PROVIDER_SITE_OTHER): Payer: Medicare Other | Admitting: Family Medicine

## 2015-09-21 ENCOUNTER — Encounter: Payer: Self-pay | Admitting: Family Medicine

## 2015-09-21 VITALS — BP 114/80 | HR 82 | Temp 98.6°F | Resp 16 | Wt 194.0 lb

## 2015-09-21 DIAGNOSIS — M5137 Other intervertebral disc degeneration, lumbosacral region: Secondary | ICD-10-CM | POA: Diagnosis not present

## 2015-09-21 DIAGNOSIS — G894 Chronic pain syndrome: Secondary | ICD-10-CM

## 2015-09-21 DIAGNOSIS — R5383 Other fatigue: Secondary | ICD-10-CM

## 2015-09-21 DIAGNOSIS — Z23 Encounter for immunization: Secondary | ICD-10-CM

## 2015-09-21 MED ORDER — OXYCODONE HCL 15 MG PO TABS: 15.0000 mg | ORAL_TABLET | ORAL | Status: DC | PRN

## 2015-09-21 MED ORDER — CYANOCOBALAMIN 1000 MCG/ML IJ SOLN
1000.0000 ug | Freq: Once | INTRAMUSCULAR | Status: AC
  Administered 2015-09-21: 1000 ug via INTRAMUSCULAR

## 2015-09-21 NOTE — Progress Notes (Signed)
Patient: Courtney Ross Female    DOB: 1955/11/16   60 y.o.   MRN: [id number] Visit Date: 09/21/2015  Today's Provider: Lelon Huh, MD   Chief Complaint  Patient presents with  . Medication Management   Subjective:    HPI  Patient wants to discuss medication oxycodone. She had been taking up to 9 tablets a day for severe DDD which did not respond to surgical intervention. She prefers short acting tablets so she can take only when and how much she needs. Her insurance put quantity limit of 180 tablets per 30 days (6 tablets per day) at the start of the year, but she states he pain in back and legs have been terrible since having to cut back on the dose, and she is often bed bound by pain.. She denies any other withdrawal symptoms. She had been on previous regiment since 2008 without escalating dosage.   Fatigue She states her energy level seems to be much better after starting back on B12 injections in January. She would like to continue month injections.   Allergies  Allergen Reactions  . Hydrocodone Itching  . Mirtazapine     Weight gain   Previous Medications   CHOLECALCIFEROL (VITAMIN D3) 5000 UNITS CAPS    Take 1 capsule by mouth daily.   CYANOCOBALAMIN (VITAMIN B-12) 500 MCG SUBL    Place 500 mcg under the tongue daily.   CYCLOBENZAPRINE (FLEXERIL) 10 MG TABLET    Take 10 mg by mouth 3 (three) times daily as needed for muscle spasms.   DESVENLAFAXINE (PRISTIQ) 100 MG 24 HR TABLET    Take 100 mg by mouth daily.   DIAZEPAM (VALIUM) 10 MG TABLET    Take 1 tablet (10 mg total) by mouth every 6 (six) hours as needed for anxiety.   ESTRADIOL (ESTRACE) 1 MG TABLET    TAKE 1 TABLET EVERY DAY   LINZESS 145 MCG CAPS CAPSULE    TAKE ONE CAPSULE DAILY   LISINOPRIL (PRINIVIL,ZESTRIL) 10 MG TABLET    Take 1 tablet (10 mg total) by mouth daily.   LYRICA 75 MG CAPSULE    TAKE ONE CAPSULE THREE TIMES A DAY   MELOXICAM (MOBIC) 15 MG TABLET       METFORMIN (GLUCOPHAGE) 500 MG  TABLET    Take 1 tablet (500 mg total) by mouth 2 (two) times daily with a meal.   MUPIROCIN OINTMENT (BACTROBAN) 2 %    Place 1 application into the nose 2 (two) times daily.   OXYCODONE (ROXICODONE) 15 MG IMMEDIATE RELEASE TABLET    Take 1 tablet (15 mg total) by mouth every 4 (four) hours as needed for pain. no more than six tablets in a day   PHENTERMINE 15 MG CAPSULE    Take 1 capsule (15 mg total) by mouth every morning.   PRAVASTATIN (PRAVACHOL) 40 MG TABLET    TAKE 1 TABLET EVERY DAY   TOPIRAMATE (TOPAMAX) 50 MG TABLET    Take 1 tablet (50 mg total) by mouth daily.    Review of Systems  Constitutional: Negative for fever, chills, appetite change and fatigue.  Respiratory: Negative for chest tightness and shortness of breath.   Cardiovascular: Negative for chest pain and palpitations.  Gastrointestinal: Negative for nausea, vomiting and abdominal pain.  Neurological: Negative for dizziness and weakness.    Social History  Substance Use Topics  . Smoking status: Never Smoker   . Smokeless tobacco: Never Used  . Alcohol  Use: 0.0 oz/week    0 Glasses of wine per week     Comment: twice a year   Objective:   BP 114/80 mmHg  Pulse 82  Temp(Src) 98.6 F (37 C) (Oral)  Resp 16  Wt 194 lb (87.998 kg)  Physical Exam  General appearance: alert, well developed, well nourished, cooperative and in no distress Head: Normocephalic, without obvious abnormality, atraumatic Lungs: Respirations even and unlabored Extremities: No gross deformities Skin: Skin color, texture, turgor normal. No rashes seen  Psych: Appropriate mood and affect. Neurologic: Mental status: Alert, oriented to person, place, and time, thought content appropriate.     Assessment & Plan:     1. Other fatigue Did well with B12 injection. Will resume monthly injections.  - cyanocobalamin ((VITAMIN B-12)) injection 1,000 mcg; Inject 1 mL (1,000 mcg total) into the muscle once.  2. Need for Tdap vaccination  -  Tdap vaccine greater than or equal to 7yo IM  3. Degeneration of lumbar or lumbosacral intervertebral disc   4. Chronic pain associated with significant psychosocial dysfunction Significant worsening of pain when reduced to 6 tablets daily due to insurance QL. She is willing to pain balance of prescription if written for 180 tablets per 20 days as she was previously taking  - oxyCODONE (ROXICODONE) 15 MG immediate release tablet; Take 1-2 tablets (15-30 mg total) by mouth every 4 (four) hours as needed for pain. no more than six tablets in a day  Dispense: 180 tablet; Refill: 0       Lelon Huh, MD  [facility]

## 2015-10-12 DIAGNOSIS — M47816 Spondylosis without myelopathy or radiculopathy, lumbar region: Secondary | ICD-10-CM | POA: Diagnosis not present

## 2015-10-12 DIAGNOSIS — M47812 Spondylosis without myelopathy or radiculopathy, cervical region: Secondary | ICD-10-CM | POA: Diagnosis not present

## 2015-10-15 ENCOUNTER — Other Ambulatory Visit: Payer: Self-pay | Admitting: Family Medicine

## 2015-10-15 DIAGNOSIS — G894 Chronic pain syndrome: Secondary | ICD-10-CM

## 2015-10-15 MED ORDER — OXYCODONE HCL 15 MG PO TABS: 15.0000 mg | ORAL_TABLET | ORAL | Status: DC | PRN

## 2015-10-15 NOTE — Telephone Encounter (Signed)
Per pt she would like a refill of oxyCODONE (ROXICODONE) 15 MG immediate release tablet  15 mg 1 to 2 tablets 4 times daily.  Please contact pt when ready for pick up.

## 2015-11-14 ENCOUNTER — Other Ambulatory Visit: Payer: Self-pay | Admitting: Family Medicine

## 2015-11-14 NOTE — Telephone Encounter (Signed)
Please call in diazepam.  

## 2015-11-16 ENCOUNTER — Other Ambulatory Visit: Payer: Self-pay | Admitting: Family Medicine

## 2015-11-16 DIAGNOSIS — D2272 Melanocytic nevi of left lower limb, including hip: Secondary | ICD-10-CM | POA: Diagnosis not present

## 2015-11-16 DIAGNOSIS — D225 Melanocytic nevi of trunk: Secondary | ICD-10-CM | POA: Diagnosis not present

## 2015-11-16 DIAGNOSIS — D2261 Melanocytic nevi of right upper limb, including shoulder: Secondary | ICD-10-CM | POA: Diagnosis not present

## 2015-11-16 DIAGNOSIS — B360 Pityriasis versicolor: Secondary | ICD-10-CM | POA: Diagnosis not present

## 2015-11-16 NOTE — Telephone Encounter (Signed)
Rx called in to pharmacy. 

## 2015-11-16 NOTE — Telephone Encounter (Signed)
Please call in diazepam.  

## 2015-11-20 ENCOUNTER — Other Ambulatory Visit: Payer: Self-pay | Admitting: Family Medicine

## 2015-11-20 DIAGNOSIS — G894 Chronic pain syndrome: Secondary | ICD-10-CM

## 2015-11-20 NOTE — Telephone Encounter (Signed)
Pt contacted office for refill request on the following medications: ° °oxyCODONE (ROXICODONE) 15 MG immediate release tablet ° °CB#[phone]/MW °

## 2015-11-21 DIAGNOSIS — H00025 Hordeolum internum left lower eyelid: Secondary | ICD-10-CM | POA: Diagnosis not present

## 2015-11-21 DIAGNOSIS — H00022 Hordeolum internum right lower eyelid: Secondary | ICD-10-CM | POA: Diagnosis not present

## 2015-11-21 DIAGNOSIS — H04123 Dry eye syndrome of bilateral lacrimal glands: Secondary | ICD-10-CM | POA: Diagnosis not present

## 2015-11-21 DIAGNOSIS — H524 Presbyopia: Secondary | ICD-10-CM | POA: Diagnosis not present

## 2015-11-21 DIAGNOSIS — H25013 Cortical age-related cataract, bilateral: Secondary | ICD-10-CM | POA: Diagnosis not present

## 2015-11-21 MED ORDER — OXYCODONE HCL 15 MG PO TABS: ORAL_TABLET | ORAL | Status: DC

## 2015-11-27 ENCOUNTER — Encounter: Payer: Self-pay | Admitting: Family Medicine

## 2015-11-27 ENCOUNTER — Ambulatory Visit (INDEPENDENT_AMBULATORY_CARE_PROVIDER_SITE_OTHER): Payer: Medicare Other | Admitting: Family Medicine

## 2015-11-27 VITALS — BP 104/66 | HR 106 | Temp 98.5°F | Resp 16 | Wt 197.0 lb

## 2015-11-27 DIAGNOSIS — E785 Hyperlipidemia, unspecified: Secondary | ICD-10-CM

## 2015-11-27 DIAGNOSIS — I10 Essential (primary) hypertension: Secondary | ICD-10-CM | POA: Diagnosis not present

## 2015-11-27 DIAGNOSIS — G8929 Other chronic pain: Secondary | ICD-10-CM

## 2015-11-27 DIAGNOSIS — M5137 Other intervertebral disc degeneration, lumbosacral region: Secondary | ICD-10-CM

## 2015-11-27 DIAGNOSIS — R5383 Other fatigue: Secondary | ICD-10-CM | POA: Diagnosis not present

## 2015-11-27 DIAGNOSIS — G2581 Restless legs syndrome: Secondary | ICD-10-CM

## 2015-11-27 MED ORDER — CYANOCOBALAMIN 1000 MCG/ML IJ SOLN
1000.0000 ug | Freq: Once | INTRAMUSCULAR | Status: AC
  Administered 2015-11-27: 1000 ug via INTRAMUSCULAR

## 2015-11-27 MED ORDER — OXYCODONE HCL 15 MG PO TABS: 15.0000 mg | ORAL_TABLET | ORAL | Status: DC | PRN

## 2015-11-27 NOTE — Progress Notes (Signed)
Patient: Courtney Ross Female    DOB: March 25, 1956   60 y.o.   MRN: [id number] Visit Date: 11/27/2015  Today's Provider: Lelon Huh, MD   Chief Complaint  Patient presents with  . Hypertension    follow up  . Fatigue    follow up  . Pain    follow up  . Hyperlipidemia    follow up  . Obesity    follow up   Subjective:    HPI  Hypertension, follow-up:  BP Readings from Last 3 Encounters:  09/21/15 114/80  07/10/15 116/78  05/29/15 120/110    She was last seen for hypertension 4 months ago.  BP at that visit was 116/78. Management since that visit includes no changes. She reports good compliance with treatment. She is not having side effects.  She is exercising. She is adherent to low salt diet.   Outside blood pressures are [id number] systolic over 0000000 diastolic. She is experiencing fatigue.  Patient denies chest pain, chest pressure/discomfort, claudication, dyspnea, exertional chest pressure/discomfort, irregular heart beat, lower extremity edema, near-syncope, orthopnea, palpitations, paroxysmal nocturnal dyspnea, syncope and tachypnea.   Cardiovascular risk factors include hypertension.  Use of agents associated with hypertension: NSAIDS.     Weight trend: fluctuating a bit Wt Readings from Last 3 Encounters:  09/21/15 194 lb (87.998 kg)  07/10/15 202 lb (91.627 kg)  05/29/15 200 lb (90.719 kg)    Current diet: in general, a "healthy" diet    ------------------------------------------------------------------------  Follow up Fatigue:  Patient was last seen 09/21/2015. Management during that visit includes starting monthly B12 Injections. Since last visit, patient has not been getting her monthly B12 injections. She reports that when she does get them they help give her more energy.   Follow up Chronic pain: Last office visit was 2 months ago. Management during that visit includes changing Oxycodone prescription back to quantity of 180 tablets  per 23 days. Prior to that her insurance restricted coverage to 6 tablets per day, but she was having significant breakthrough pain. She usually take 2 in the morning, 1 at bedtime, and 3-4 times through out the day, average about 7 per day since her last visit. She was seen a few months ago by Dr. Carloyn Manner and discusses surgery in her neck which she decided against due to uncertainty of benefit.     Lipid/Cholesterol, Follow-up:   Last seen 3 months ago. . Last Lipid Panel:    Component Value Date/Time   CHOL 239* 11/09/2014   TRIG 232* 11/09/2014   HDL 68 11/09/2014   [place] 125 11/09/2014    Risk factors for vascular disease include hypercholesterolemia and hypertension  She reports good compliance with treatment. She is not having side effects.  Current symptoms include none and have been stable. Weight trend: fluctuating a bit Prior visit with dietician: no Current diet: in general, a "healthy" diet   Current exercise: walking  Wt Readings from Last 3 Encounters:  09/21/15 194 lb (87.998 kg)  07/10/15 202 lb (91.627 kg)  05/29/15 200 lb (90.719 kg)    ------------------------------------------------------------------- Follow up Obesity: Last office visit was 5 months ago. Changes made during that visit includes starting Phentermine 15mg  daily and Topamax 50mg  daily. Patient was also counseled on the improtance of diet and exercise. Since last visit, patient states she took a few pills then stopped because she couldn't tell a difference.     Allergies  Allergen Reactions  . Hydrocodone Itching  .  Mirtazapine     Weight gain   Previous Medications   CHOLECALCIFEROL (VITAMIN D3) 5000 UNITS CAPS    Take 1 capsule by mouth daily.   CYANOCOBALAMIN (VITAMIN B-12) 500 MCG SUBL    Place 500 mcg under the tongue daily.   CYCLOBENZAPRINE (FLEXERIL) 10 MG TABLET    Take 10 mg by mouth 3 (three) times daily as needed for muscle spasms.   DESVENLAFAXINE (PRISTIQ) 100 MG 24 HR  TABLET    TAKE 1 TABLET EVERY DAY   DIAZEPAM (VALIUM) 10 MG TABLET    TAKE ONE TABLET FOUR TIMES A DAY   ESTRADIOL (ESTRACE) 1 MG TABLET    TAKE 1 TABLET EVERY DAY   LINZESS 145 MCG CAPS CAPSULE    TAKE ONE CAPSULE DAILY   LISINOPRIL (PRINIVIL,ZESTRIL) 10 MG TABLET    Take 1 tablet (10 mg total) by mouth daily.   LYRICA 75 MG CAPSULE    TAKE ONE CAPSULE THREE TIMES A DAY   MELOXICAM (MOBIC) 15 MG TABLET       METFORMIN (GLUCOPHAGE) 500 MG TABLET    Take 1 tablet (500 mg total) by mouth 2 (two) times daily with a meal.   MUPIROCIN OINTMENT (BACTROBAN) 2 %    Place 1 application into the nose 2 (two) times daily.   OXYCODONE (ROXICODONE) 15 MG IMMEDIATE RELEASE TABLET    no more than six tablets in a day. Patient needs to schedule office visit.   PHENTERMINE 15 MG CAPSULE    Take 1 capsule (15 mg total) by mouth every morning.   PRAVASTATIN (PRAVACHOL) 40 MG TABLET    TAKE 1 TABLET EVERY DAY   RESTASIS 0.05 % OPHTHALMIC EMULSION    Apply 1 drop to eye 2 (two) times daily.   TOPIRAMATE (TOPAMAX) 50 MG TABLET    Take 1 tablet (50 mg total) by mouth daily.    Review of Systems  Constitutional: Positive for fatigue. Negative for fever, chills and appetite change.  Respiratory: Negative for chest tightness and shortness of breath.   Cardiovascular: Negative for chest pain and palpitations.  Gastrointestinal: Negative for nausea, vomiting and abdominal pain.  Neurological: Negative for dizziness and weakness.    Social History  Substance Use Topics  . Smoking status: Never Smoker   . Smokeless tobacco: Never Used  . Alcohol Use: 0.0 oz/week    0 Glasses of wine per week     Comment: twice a year   Objective:   BP 104/66 mmHg  Pulse 106  Temp(Src) 98.5 F (36.9 C) (Oral)  Resp 16  Wt 197 lb (89.359 kg)  SpO2 95%  Physical Exam   General Appearance:    Alert, cooperative, no distress  Eyes:    PERRL, conjunctiva/corneas clear, EOM's intact       Lungs:     Clear to auscultation  bilaterally, respirations unlabored  Heart:    Regular rate and rhythm  Neurologic:   Awake, alert, oriented x 3. No apparent focal neurological           defect.   MS:   Diffuse tenderness of cervical and lumbar spine. Reduced ROM of head rotation and flexion. Normal MS.        Assessment & Plan:     1. Chronic pain Extensive discussion regarding risk/benefits of opiods, reviewed controlled drug contract which she signed today. Work on keeping dose to minimum. She has cut from average of 8/day to 7 day and is doing fairly well on  this regiment.  - Pain Mgt Scrn ([facility]), Ur - oxyCODONE (ROXICODONE) 15 MG immediate release tablet; Take 1-2 tablets (15-30 mg total) by mouth every 4 (four) hours as needed for pain.  Dispense: 180 tablet; Refill: 0  2. Degeneration of lumbar or lumbosacral intervertebral disc Continue regular follow up with Dr. Carloyn Manner   3. Dyslipidemia She is tolerating pravastatin well with no adverse effects.   - Lipid panel - Hepatic function panel  4. Essential hypertension Well controlled.  Continue current medications.   - Renal function panel  5. Restless legs syndrome Does well with diazepam hs, but no record of having ferritin checked in past.  - Ferritin  6. Other fatigue She feels that B12 is making significant improvement.  - cyanocobalamin ((VITAMIN B-12)) injection 1,000 mcg; Inject 1 mL (1,000 mcg total) into the muscle once.  Return in about 6 months (around 05/28/2016).       Lelon Huh, MD  [facility]

## 2015-11-29 DIAGNOSIS — I10 Essential (primary) hypertension: Secondary | ICD-10-CM | POA: Diagnosis not present

## 2015-11-29 DIAGNOSIS — E785 Hyperlipidemia, unspecified: Secondary | ICD-10-CM | POA: Diagnosis not present

## 2015-11-29 DIAGNOSIS — K760 Fatty (change of) liver, not elsewhere classified: Secondary | ICD-10-CM | POA: Diagnosis not present

## 2015-11-29 DIAGNOSIS — G2581 Restless legs syndrome: Secondary | ICD-10-CM | POA: Diagnosis not present

## 2015-11-29 LAB — PAIN MGT SCRN (14 DRUGS), UR
Amphetamine Screen, Ur: NEGATIVE ng/mL
Barbiturate Screen, Ur: NEGATIVE ng/mL
Benzodiazepine Screen, Urine: POSITIVE ng/mL
Buprenorphine, Urine: NEGATIVE ng/mL
Cannabinoids Ur Ql Scn: NEGATIVE ng/mL
Cocaine(Metab.)Screen, Urine: NEGATIVE ng/mL
Creatinine(Crt), U: 87.9 mg/dL (ref 20.0–300.0)
Fentanyl, Urine: NEGATIVE pg/mL
Meperidine Screen, Urine: NEGATIVE ng/mL
Methadone Scn, Ur: NEGATIVE ng/mL
Opiate Scrn, Ur: NEGATIVE ng/mL
Oxycodone+Oxymorphone Ur Ql Scn: POSITIVE ng/mL
PCP Scrn, Ur: NEGATIVE ng/mL
Ph of Urine: 5.6 (ref 4.5–8.9)
Propoxyphene, Screen: NEGATIVE ng/mL
Tramadol Ur Ql Scn: NEGATIVE ng/mL

## 2015-11-30 LAB — RENAL FUNCTION PANEL
Albumin: 3.9 g/dL (ref 3.6–4.8)
BUN/Creatinine Ratio: 23 (ref 12–28)
BUN: 17 mg/dL (ref 8–27)
CO2: 24 mmol/L (ref 18–29)
Calcium: 9.2 mg/dL (ref 8.7–10.3)
Chloride: 101 mmol/L (ref 96–106)
Creatinine, Ser: 0.74 mg/dL (ref 0.57–1.00)
GFR calc Af Amer: 102 mL/min/{1.73_m2} (ref >59)
GFR calc non Af Amer: 88 mL/min/{1.73_m2} (ref >59)
Glucose: 93 mg/dL (ref 65–99)
Phosphorus: 3.2 mg/dL (ref 2.5–4.5)
Potassium: 4.9 mmol/L (ref 3.5–5.2)
Sodium: 140 mmol/L (ref 134–144)

## 2015-11-30 LAB — LIPID PANEL
Chol/HDL Ratio: 2.9 ratio units (ref 0.0–4.4)
Cholesterol, Total: 195 mg/dL (ref 100–199)
HDL: 67 mg/dL (ref >39)
LDL Calculated: 90 mg/dL (ref 0–99)
Triglycerides: 189 mg/dL — ABNORMAL HIGH (ref 0–149)
VLDL Cholesterol Cal: 38 mg/dL (ref 5–40)

## 2015-11-30 LAB — HEPATIC FUNCTION PANEL
ALT: 13 IU/L (ref 0–32)
AST: 16 IU/L (ref 0–40)
Alkaline Phosphatase: 92 IU/L (ref 39–117)
Bilirubin Total: 0.2 mg/dL (ref 0.0–1.2)
Bilirubin, Direct: 0.08 mg/dL (ref 0.00–0.40)
Total Protein: 6.4 g/dL (ref 6.0–8.5)

## 2015-11-30 LAB — FERRITIN: Ferritin: 178 ng/mL — ABNORMAL HIGH (ref 15–150)

## 2015-12-12 ENCOUNTER — Other Ambulatory Visit: Payer: Self-pay | Admitting: Family Medicine

## 2015-12-13 ENCOUNTER — Other Ambulatory Visit: Payer: Self-pay | Admitting: Family Medicine

## 2015-12-13 DIAGNOSIS — G8929 Other chronic pain: Secondary | ICD-10-CM

## 2015-12-13 DIAGNOSIS — M5137 Other intervertebral disc degeneration, lumbosacral region: Secondary | ICD-10-CM

## 2015-12-13 MED ORDER — OXYCODONE HCL 15 MG PO TABS: 15.0000 mg | ORAL_TABLET | ORAL | Status: DC | PRN

## 2015-12-17 ENCOUNTER — Telehealth: Payer: Self-pay | Admitting: Family Medicine

## 2015-12-17 NOTE — Telephone Encounter (Signed)
Pt is requesting refill on oxyCODONE (ROXICODONE) 15 MG immediate release tablet

## 2015-12-17 NOTE — Telephone Encounter (Signed)
Prescription placed up front for pick up.

## 2016-01-14 ENCOUNTER — Other Ambulatory Visit: Payer: Self-pay | Admitting: Family Medicine

## 2016-01-14 DIAGNOSIS — M5137 Other intervertebral disc degeneration, lumbosacral region: Secondary | ICD-10-CM

## 2016-01-14 DIAGNOSIS — G8929 Other chronic pain: Secondary | ICD-10-CM

## 2016-01-14 MED ORDER — OXYCODONE HCL 15 MG PO TABS: 15.0000 mg | ORAL_TABLET | ORAL | 0 refills | Status: DC | PRN

## 2016-01-16 ENCOUNTER — Telehealth: Payer: Self-pay | Admitting: Family Medicine

## 2016-01-16 NOTE — Telephone Encounter (Signed)
Prescription placed up front for pick up.

## 2016-01-16 NOTE — Telephone Encounter (Signed)
Pt requesting refill of oxyCODONE (ROXICODONE) 15 MG immediate release tablet

## 2016-01-22 ENCOUNTER — Other Ambulatory Visit: Payer: Self-pay | Admitting: Family Medicine

## 2016-01-22 DIAGNOSIS — Z1231 Encounter for screening mammogram for malignant neoplasm of breast: Secondary | ICD-10-CM

## 2016-02-04 ENCOUNTER — Ambulatory Visit
Admission: RE | Admit: 2016-02-04 | Discharge: 2016-02-04 | Disposition: A | Discharge status: Home / Self Care | Payer: Medicare Other | Source: Ambulatory Visit | Attending: Family Medicine | Admitting: Family Medicine

## 2016-02-04 ENCOUNTER — Other Ambulatory Visit: Payer: Self-pay | Admitting: Family Medicine

## 2016-02-04 DIAGNOSIS — Z1231 Encounter for screening mammogram for malignant neoplasm of breast: Secondary | ICD-10-CM | POA: Diagnosis not present

## 2016-02-11 ENCOUNTER — Other Ambulatory Visit: Payer: Self-pay | Admitting: Family Medicine

## 2016-02-13 ENCOUNTER — Other Ambulatory Visit: Payer: Self-pay | Admitting: Family Medicine

## 2016-02-13 DIAGNOSIS — G8929 Other chronic pain: Secondary | ICD-10-CM

## 2016-02-13 DIAGNOSIS — M5137 Other intervertebral disc degeneration, lumbosacral region: Secondary | ICD-10-CM

## 2016-02-13 MED ORDER — OXYCODONE HCL 15 MG PO TABS: 15.0000 mg | ORAL_TABLET | ORAL | 0 refills | Status: DC | PRN

## 2016-02-13 NOTE — Telephone Encounter (Signed)
Pt contacted office for refill request on the following medications: ° °oxyCODONE (ROXICODONE) 15 MG immediate release tablet ° °CB#[phone]/MW °

## 2016-02-13 NOTE — Telephone Encounter (Signed)
Last refill 01/16/2016. LOV 11/27/2015. Renaldo Fiddler, CMA

## 2016-02-13 NOTE — Telephone Encounter (Signed)
Pt advised. Emily Drozdowski, CMA  

## 2016-03-11 ENCOUNTER — Other Ambulatory Visit: Payer: Self-pay | Admitting: Family Medicine

## 2016-03-11 DIAGNOSIS — M5137 Other intervertebral disc degeneration, lumbosacral region: Secondary | ICD-10-CM

## 2016-03-11 DIAGNOSIS — G8929 Other chronic pain: Secondary | ICD-10-CM

## 2016-03-11 MED ORDER — OXYCODONE HCL 15 MG PO TABS: 15.0000 mg | ORAL_TABLET | ORAL | 0 refills | Status: DC | PRN

## 2016-03-14 ENCOUNTER — Telehealth: Payer: Self-pay | Admitting: Family Medicine

## 2016-03-14 NOTE — Telephone Encounter (Signed)
error 

## 2016-03-17 ENCOUNTER — Other Ambulatory Visit: Payer: Self-pay | Admitting: *Deleted

## 2016-03-17 MED ORDER — PREGABALIN 75 MG PO CAPS: ORAL_CAPSULE | ORAL | 5 refills | Status: DC

## 2016-03-17 MED ORDER — DIAZEPAM 10 MG PO TABS: ORAL_TABLET | ORAL | 3 refills | Status: DC

## 2016-03-17 NOTE — Telephone Encounter (Signed)
Rx called in to pharmacy. 

## 2016-03-17 NOTE — Telephone Encounter (Signed)
Refill request for Lyrica 75 mg 1 cap tid Last filled by MD on- 08/27/2015 x5 refills Last Appt: 11/27/2015 Next Appt: 05/28/2016 Please advise refill?

## 2016-03-17 NOTE — Telephone Encounter (Signed)
Refill request for Diazepam 10 mg 1 tab x4 qd Last filled by MD on- 11/16/15 #120 x3 refills Last Appt: 11/27/15 Next Appt: 05/28/16 Please advise refill?

## 2016-04-01 DIAGNOSIS — M47816 Spondylosis without myelopathy or radiculopathy, lumbar region: Secondary | ICD-10-CM | POA: Diagnosis not present

## 2016-04-01 DIAGNOSIS — M47812 Spondylosis without myelopathy or radiculopathy, cervical region: Secondary | ICD-10-CM | POA: Diagnosis not present

## 2016-04-10 ENCOUNTER — Encounter: Payer: Self-pay | Admitting: Obstetrics and Gynecology

## 2016-04-10 ENCOUNTER — Ambulatory Visit (INDEPENDENT_AMBULATORY_CARE_PROVIDER_SITE_OTHER): Payer: Medicare Other | Admitting: Obstetrics and Gynecology

## 2016-04-10 VITALS — BP 120/77 | HR 112 | Ht 68.0 in | Wt 208.1 lb

## 2016-04-10 DIAGNOSIS — Z780 Asymptomatic menopausal state: Secondary | ICD-10-CM | POA: Diagnosis not present

## 2016-04-10 DIAGNOSIS — Z90710 Acquired absence of both cervix and uterus: Secondary | ICD-10-CM | POA: Diagnosis not present

## 2016-04-10 DIAGNOSIS — R87620 Atypical squamous cells of undetermined significance on cytologic smear of vagina (ASC-US): Secondary | ICD-10-CM | POA: Diagnosis not present

## 2016-04-10 DIAGNOSIS — Z6832 Body mass index (BMI) 32.0-32.9, adult: Secondary | ICD-10-CM

## 2016-04-10 DIAGNOSIS — E669 Obesity, unspecified: Secondary | ICD-10-CM

## 2016-04-10 DIAGNOSIS — Z0000 Encounter for general adult medical examination without abnormal findings: Secondary | ICD-10-CM | POA: Diagnosis not present

## 2016-04-10 DIAGNOSIS — N8111 Cystocele, midline: Secondary | ICD-10-CM | POA: Diagnosis not present

## 2016-04-10 DIAGNOSIS — Z01419 Encounter for gynecological examination (general) (routine) without abnormal findings: Secondary | ICD-10-CM

## 2016-04-10 DIAGNOSIS — Z1211 Encounter for screening for malignant neoplasm of colon: Secondary | ICD-10-CM

## 2016-04-10 DIAGNOSIS — Z124 Encounter for screening for malignant neoplasm of cervix: Secondary | ICD-10-CM

## 2016-04-10 DIAGNOSIS — N816 Rectocele: Secondary | ICD-10-CM

## 2016-04-10 MED ORDER — EST ESTROGENS-METHYLTEST 1.25-2.5 MG PO TABS: 1.0000 | ORAL_TABLET | Freq: Every day | ORAL | 1 refills | Status: DC

## 2016-04-10 NOTE — Progress Notes (Signed)
ANNUAL PREVENTATIVE CARE GYN  ENCOUNTER NOTE  Subjective:       Courtney Ross is a 60 y.o. [phone] female here for a routine annual gynecologic exam and to establish care. Last gynecological exam was 4-5 years ago. Menopausal with hot flashes- currently on Estradiol 1mg  daily for the past 10 years. Pertinent past sugeries include TAH for severe endometriosis, right salpingectomy for tubal pregnancy, bladder sling, right hernia repair with appendectomy and spinal cord stimulator placement in RLQ.   Follows with Primary Care, last pap was normal 5/16. History of  "many" abnormal paps- believes the last was in 2014. No known history of pre-cancerous lesions/dysplasia. Has never had a bone scan, last mammogram 8/17 (normal), colonoscopy routinely at another clinic.    Current complaints: 1.   Chronic constipation- occasionally has trouble clearing her bowels and has a need for splinting. 2.   Menopausal- hot flashes reduced with Estradiol but still present     Gynecologic History No LMP recorded. Patient has had a hysterectomy. Contraception: status post hysterectomy Last Pap: 2016. Results were: normal Last mammogram: 2017. Results were: normal   Obstetric History OB History  Gravida Para Term Preterm AB Living  7 4 4   3 4   SAB TAB Ectopic Multiple Live Births  2   1   4     # Outcome Date GA Lbr Len/2nd Weight Sex Delivery Anes PTL Lv  7 Term 1984   6 lb 6.4 oz (2.903 kg) F Vag-Spont   LIV  6 Term 1980   8 lb 4.8 oz (3.765 kg) M Vag-Spont   LIV  5 Term 1977   7 lb (3.175 kg) M Vag-Spont   LIV  4 Term 1972   7 lb 9.6 oz (3.447 kg) M Vag-Spont   LIV  3 Ectopic           2 SAB           1 SAB               Past Medical History:  Diagnosis Date  . Anxiety   . Back pain   . Degenerative disc disease, lumbar   . Depression   . Fatty liver   . Headache    migraines  . Headache, migraine 05/08/2015  . High cholesterol   . Polycystic ovarian disease   . PONV (postoperative  nausea and vomiting)   . Restless leg syndrome   . Spasm 01/29/2009    Past Surgical History:  Procedure Laterality Date  . [place]  . [place]ENDECTOMY  [place]  . ARTHRODESIS METATARSALPHALANGEAL JOINT (MTPJ) Left 12/14/2014   Procedure: ARTHRODESIS METATARSALPHALANGEAL JOINT (MTPJ) 1ST;  Surgeon: Albertine Patricia, DPM;  Location: [facility];  Service: Podiatry;  Laterality: Left;  LMA  . BACK SURGERY  [phone]   x3  . BLADDER SUSPENSION  2006  . BREAST BIOPSY Left   . BREAST SURGERY  2005   lumpectomy  . CARPAL TUNNEL RELEASE Bilateral 02/26/07,05/21/07  . COLPORRHAPHY    . [place]  . FOOT SURGERY    . HAMMER TOE SURGERY Left 03/22/2015   Procedure: HAMMER TOE CORRECTION L 2ND AND 3RD ;  Surgeon: Albertine Patricia, DPM;  Location: [facility];  Service: Podiatry;  Laterality: Left;  WITH LOCAL  . HERNIA REPAIR  1988  . NECK SURGERY  2006   fusion  . SPINAL CORD STIMULATOR IMPLANT    . TONSILLECTOMY  1985    Current Outpatient Prescriptions on File Prior to Visit  Medication Sig Dispense Refill  . Cholecalciferol (VITAMIN D3) 5000 UNITS CAPS Take 1 capsule by mouth daily.    . Cyanocobalamin (VITAMIN B-12) 500 MCG SUBL Place 500 mcg under the tongue daily.    . cyclobenzaprine (FLEXERIL) 10 MG tablet Take 10 mg by mouth 3 (three) times daily as needed for muscle spasms.    Marland Kitchen desvenlafaxine (PRISTIQ) 100 MG 24 hr tablet TAKE 1 TABLET EVERY DAY 30 tablet 5  . diazepam (VALIUM) 10 MG tablet TAKE ONE TABLET FOUR TIMES A DAY 120 tablet 3  . estradiol (ESTRACE) 1 MG tablet TAKE 1 TABLET EVERY DAY 90 tablet 4  . LINZESS 145 MCG CAPS capsule TAKE ONE CAPSULE DAILY 30 capsule 12  . lisinopril (PRINIVIL,ZESTRIL) 10 MG tablet Take 1 tablet (10 mg total) by mouth daily. 90 tablet 1  . meloxicam (MOBIC) 15 MG tablet     . oxyCODONE (ROXICODONE) 15 MG immediate release tablet Take 1-2 tablets (15-30 mg total) by mouth every 4 (four)  hours as needed for pain. 180 tablet 0  . pravastatin (PRAVACHOL) 40 MG tablet TAKE 1 TABLET BY MOUTH DAILY 30 tablet 12  . pregabalin (LYRICA) 75 MG capsule TAKE ONE CAPSULE THREE TIMES A DAY 90 capsule 5  . RESTASIS 0.05 % ophthalmic emulsion Apply 1 drop to eye 2 (two) times daily.     No current facility-administered medications on file prior to visit.     Allergies  Allergen Reactions  . Hydrocodone Itching  . Mirtazapine     Weight gain    Social History   Social History  . Marital status: Married    Spouse name: N/A  . Number of children: N/A  . Years of education: N/A   Occupational History  . Not on file.   Social History Main Topics  . Smoking status: Never Smoker  . Smokeless tobacco: Never Used  . Alcohol use 0.0 oz/week     Comment: twice a year  . Drug use: No  . Sexual activity: Not on file   Other Topics Concern  . Not on file   Social History Narrative  . No narrative on file    Family History  Problem Relation Age of Onset  . Diabetes Father   . Hypertension Father   . CAD Father   . Diabetes Sister     non-Insulin dependent diabetes mellitus  . Heart disease Brother   . Cancer Daughter     cancer of the cervix at age 31-18  . Heart disease Brother     The following portions of the patient's history were reviewed and updated as appropriate: allergies, current medications, past family history, past medical history, past social history, past surgical history and problem list.  Review of Systems ROS Review of Systems - General ROS: negative for - chills, fatigue, fever, hot flashes, night sweats, weight gain or weight loss Psychological ROS: negative for - anxiety, decreased libido, depression, mood swings, physical abuse or sexual abuse Ophthalmic ROS: negative for - blurry vision, eye pain or loss of vision ENT ROS: negative for - headaches, hearing change, visual changes or vocal changes Allergy and Immunology ROS: negative for - hives,  itchy/watery eyes or seasonal allergies Hematological and Lymphatic ROS: negative for - bleeding problems, bruising, swollen lymph nodes or weight loss Endocrine ROS: negative for - galactorrhea, hair pattern changes, hot flashes, malaise/lethargy, mood swings, palpitations, polydipsia/polyuria, skin changes, temperature intolerance  or unexpected weight changes Breast ROS: negative for - new or changing breast lumps or nipple discharge Respiratory ROS: negative for - cough or shortness of breath Cardiovascular ROS: negative for - chest pain, irregular heartbeat, palpitations or shortness of breath Gastrointestinal ROS: no abdominal pain, change in bowel habits, or black or bloody stools Genito-Urinary ROS: no dysuria, trouble voiding, or hematuria Musculoskeletal ROS: negative for - joint pain or joint stiffness Neurological ROS: negative for - bowel and bladder control changes Dermatological ROS: negative for rash and skin lesion changes   Objective:   BP 120/77   Pulse (!) 112   Ht 5\' 8"  (1.727 m)   Wt 208 lb 1.6 oz (94.4 kg)   BMI 31.64 kg/m  CONSTITUTIONAL: Well-developed, well-nourished female in no acute distress.  PSYCHIATRIC: Normal mood and affect. Normal behavior. Normal judgment and thought content. [place]: Alert and oriented to person, place, and time. Normal muscle tone coordination. No cranial nerve deficit noted. HENT:  Normocephalic, atraumatic, External right and left ear normal.  EYES: Conjunctivae and EOM are normal. Pupils are equal, round, and reactive to light. No scleral icterus.  NECK: Normal range of motion, supple, no masses.  Normal thyroid.  SKIN: Skin is warm and dry. No rash noted. Not diaphoretic. No erythema. No pallor. CARDIOVASCULAR: Normal heart rate noted, regular rhythm, no murmur. RESPIRATORY: Clear to auscultation bilaterally. Effort and breath sounds normal, no problems with respiration noted. BREASTS: Symmetric in size. No masses, skin changes,  nipple drainage, or lymphadenopathy. ABDOMEN: Soft, normal bowel sounds, no distention noted.  No tenderness, rebound or guarding. Spinal cord stimulator apparatus palpated in the right lower abdomen; well-healed appendectomy incision and hysterectomy incision BLADDER: Normal PELVIC:  External Genitalia: Normal  BUS: Normal  Vagina: Second degree cystocele; good support at the urethrovesical junction; moderate rectocele; no enterocele; fair estrogen effect  Cervix: Surgically absent  Uterus: Surgically absent  Adnexa: Normal; nonpalpable nontender  RV: External Exam NormaI, No Rectal Masses and Normal Sphincter tone  MUSCULOSKELETAL: Normal range of motion. No tenderness.  No cyanosis, clubbing, or edema.  2+ distal pulses. LYMPHATIC: No Axillary, Supraclavicular, or Inguinal Adenopathy.    Assessment:   Annual gynecologic examination 60 y.o. Contraception: status post hysterectomy- TAH (right salpingectomy for ectopic) Obesity 1  Menopausal- symptomatic hot flashes while on Estradiol Second degree cystocele- s/p bladder sling. Occasional leaking overnight, otherwise not symptomatic. Moderate rectocele- symptomatic. Difficulty emptying bowels and occasional need for splinting.  Mild joint pain and h/o decrease in height, never received dexa scan.  Plan:  Pap: Pap Co Test- if test returns negative no need for further paps Mammogram: utd Stool Guaiac Testing:  Ordered Labs: thru pcp Routine preventative health maintenance measures emphasized: Exercise/Diet/Weight control, Tobacco Warnings and Alcohol/Substance use risks  Counseled on water consumption, healthy diet, Colace and fiber for rectocele related constipation. RTC if symptoms persist/worsen or cannot be tolerated.  DEXA scan ordered.  Switch patient to EstraTest 1.25/2.5 mg to see if hot flashes improve. Call clinic if any side effects and we will revert back to Estradiol.    Return to [place],  PA-S Courtney Ross, CMA  Brayton Mars, MD   I have seen, interviewed, and examined the patient in conjunction with the [facility]. student and affirm the diagnosis and management plan. Martin A. DeFrancesco, MD, FACOG   Note: This dictation was prepared with Dragon dictation along with smaller phrase technology. Any transcriptional errors that result from this process are  unintentional.

## 2016-04-11 DIAGNOSIS — N8111 Cystocele, midline: Secondary | ICD-10-CM | POA: Insufficient documentation

## 2016-04-11 DIAGNOSIS — N816 Rectocele: Secondary | ICD-10-CM | POA: Insufficient documentation

## 2016-04-15 ENCOUNTER — Other Ambulatory Visit: Payer: Self-pay | Admitting: Family Medicine

## 2016-04-15 DIAGNOSIS — M5137 Other intervertebral disc degeneration, lumbosacral region: Secondary | ICD-10-CM

## 2016-04-15 DIAGNOSIS — Z23 Encounter for immunization: Secondary | ICD-10-CM | POA: Diagnosis not present

## 2016-04-15 MED ORDER — OXYCODONE HCL 15 MG PO TABS: 15.0000 mg | ORAL_TABLET | ORAL | 0 refills | Status: DC | PRN

## 2016-04-15 NOTE — Telephone Encounter (Signed)
Pt needs refill on her oxycodone 15mg   Please call when ready to pick up [phone]  Thanks Con Memos

## 2016-04-17 DIAGNOSIS — Z1211 Encounter for screening for malignant neoplasm of colon: Secondary | ICD-10-CM | POA: Diagnosis not present

## 2016-04-17 LAB — PAP IG W/ RFLX HPV ASCU: PAP Smear Comment: 0

## 2016-04-18 LAB — PLEASE NOTE

## 2016-04-18 LAB — FECAL OCCULT BLOOD, IMMUNOCHEMICAL: Fecal Occult Bld: NEGATIVE

## 2016-05-07 ENCOUNTER — Telehealth: Payer: Self-pay | Admitting: Family Medicine

## 2016-05-07 NOTE — Telephone Encounter (Signed)
Called Pt to schedule AWV with [facility] - knb °

## 2016-05-12 ENCOUNTER — Other Ambulatory Visit: Payer: Self-pay | Admitting: Family Medicine

## 2016-05-12 DIAGNOSIS — M5137 Other intervertebral disc degeneration, lumbosacral region: Secondary | ICD-10-CM

## 2016-05-12 NOTE — Telephone Encounter (Signed)
Please review. KW 

## 2016-05-12 NOTE — Telephone Encounter (Signed)
Pt contacted office for refill request on the following medications: oxyCODONE (ROXICODONE) 15 MG immediate release tablet Last Written: 04/15/16 Last OV: 11/27/15 Scheduled for F/U on 05/30/16. Please advise. Thanks TNP

## 2016-05-13 MED ORDER — OXYCODONE HCL 15 MG PO TABS: 15.0000 mg | ORAL_TABLET | ORAL | 0 refills | Status: DC | PRN

## 2016-05-28 ENCOUNTER — Ambulatory Visit: Payer: Medicare Other | Admitting: Family Medicine

## 2016-05-29 ENCOUNTER — Ambulatory Visit (INDEPENDENT_AMBULATORY_CARE_PROVIDER_SITE_OTHER): Payer: Medicare Other | Admitting: Obstetrics and Gynecology

## 2016-05-29 ENCOUNTER — Ambulatory Visit: Payer: Medicare Other

## 2016-05-29 VITALS — BP 107/74 | HR 108 | Ht 68.0 in | Wt 208.8 lb

## 2016-05-29 DIAGNOSIS — N8111 Cystocele, midline: Secondary | ICD-10-CM | POA: Diagnosis not present

## 2016-05-29 DIAGNOSIS — N816 Rectocele: Secondary | ICD-10-CM | POA: Diagnosis not present

## 2016-05-29 DIAGNOSIS — N3289 Other specified disorders of bladder: Secondary | ICD-10-CM | POA: Diagnosis not present

## 2016-05-29 MED ORDER — MIRABEGRON ER 25 MG PO TB24: 25.0000 mg | ORAL_TABLET | Freq: Every day | ORAL | 6 refills | Status: DC

## 2016-05-29 NOTE — Patient Instructions (Addendum)
1. Start Myrbetriq 1 tablet daily for unstable bladder 2. Posterior repair and enterocele ligation, anterior repair is to be scheduled 3. Return in a week before surgery for preoperati

## 2016-05-30 ENCOUNTER — Ambulatory Visit (INDEPENDENT_AMBULATORY_CARE_PROVIDER_SITE_OTHER): Payer: Medicare Other

## 2016-05-30 ENCOUNTER — Ambulatory Visit (INDEPENDENT_AMBULATORY_CARE_PROVIDER_SITE_OTHER): Payer: Medicare Other | Admitting: Family Medicine

## 2016-05-30 VITALS — BP 128/80 | HR 92 | Temp 97.0°F | Ht 68.0 in | Wt 213.0 lb

## 2016-05-30 DIAGNOSIS — K760 Fatty (change of) liver, not elsewhere classified: Secondary | ICD-10-CM | POA: Diagnosis not present

## 2016-05-30 DIAGNOSIS — I10 Essential (primary) hypertension: Secondary | ICD-10-CM | POA: Diagnosis not present

## 2016-05-30 DIAGNOSIS — F324 Major depressive disorder, single episode, in partial remission: Secondary | ICD-10-CM | POA: Diagnosis not present

## 2016-05-30 DIAGNOSIS — Z0000 Encounter for general adult medical examination without abnormal findings: Secondary | ICD-10-CM | POA: Diagnosis not present

## 2016-05-30 DIAGNOSIS — M5137 Other intervertebral disc degeneration, lumbosacral region: Secondary | ICD-10-CM | POA: Diagnosis not present

## 2016-05-30 DIAGNOSIS — Z114 Encounter for screening for human immunodeficiency virus [HIV]: Secondary | ICD-10-CM

## 2016-05-30 DIAGNOSIS — E038 Other specified hypothyroidism: Secondary | ICD-10-CM | POA: Diagnosis not present

## 2016-05-30 DIAGNOSIS — F1190 Opioid use, unspecified, uncomplicated: Secondary | ICD-10-CM | POA: Diagnosis not present

## 2016-05-30 MED ORDER — BUPROPION HCL ER (SR) 100 MG PO TB12: 100.0000 mg | ORAL_TABLET | Freq: Two times a day (BID) | ORAL | 2 refills | Status: DC

## 2016-05-30 MED ORDER — MUPIROCIN CALCIUM 2 % EX CREA: 1.0000 "application " | TOPICAL_CREAM | Freq: Two times a day (BID) | CUTANEOUS | 1 refills | Status: DC

## 2016-05-30 NOTE — Progress Notes (Signed)
GYN ENCOUNTER NOTE  Subjective:       Courtney Ross is a 60 y.o. [id number] female is here for gynecologic evaluation of the following issues:  1. Symptomatic rectocele  Patient has no resolution of symptomatic rectocele despite dietary modification with increased fiber, stool softeners, and water intake. She continues to have to splint for bowel movements. Her routine activities are compromised especially when out and about and needing to go to the bathroom because of difficulty with controlling soiling during bowel movements.  Patient is status post TAH for severe endometriosis, right salpingectomy for tubal pregnancy, bladder sling by Dr. Jacqlyn Larsen and Dr. Davis Gourd.     Gynecologic History No LMP recorded. Patient has had a hysterectomy. Contraception: status post hysterectomy Last Pap: 2016. Results were: normal Last mammogram: 2017. Results were: normal  Obstetric History OB History  Gravida Para Term Preterm AB Living  7 4 4   3 4   SAB TAB Ectopic Multiple Live Births  2   1   4     # Outcome Date GA Lbr Len/2nd Weight Sex Delivery Anes PTL Lv  7 Term 1984   6 lb 6.4 oz (2.903 kg) F Vag-Spont   LIV  6 Term 1980   8 lb 4.8 oz (3.765 kg) M Vag-Spont   LIV  5 Term 1977   7 lb (3.175 kg) M Vag-Spont   LIV  4 Term 1972   7 lb 9.6 oz (3.447 kg) M Vag-Spont   LIV  3 Ectopic           2 SAB           1 SAB               Past Medical History:  Diagnosis Date  . Anxiety   . Back pain   . Degenerative disc disease, lumbar   . Depression   . Fatty liver   . Headache    migraines  . Headache, migraine 05/08/2015  . High cholesterol   . Polycystic ovarian disease   . PONV (postoperative nausea and vomiting)   . Restless leg syndrome   . Spasm 01/29/2009    Past Surgical History:  Procedure Laterality Date  . [place]      [place]  . APPENDECTOMY  1988  . ARTHRODESIS METATARSALPHALANGEAL JOINT (MTPJ) Left 12/14/2014   Procedure: ARTHRODESIS METATARSALPHALANGEAL  JOINT (MTPJ) 1ST;  Surgeon: Albertine Patricia, DPM;  Location: [facility];  Service: Podiatry;  Laterality: Left;  LMA  . BACK SURGERY  [phone]   x3  . BLADDER SUSPENSION  2006  . BREAST BIOPSY Left   . BREAST SURGERY  2005   lumpectomy  . CARPAL TUNNEL RELEASE Bilateral 02/26/07,05/21/07  . COLPORRHAPHY    . DILATION AND CURETTAGE OF UTERUS    . [place]  . FOOT SURGERY    . HAMMER TOE SURGERY Left 03/22/2015   Procedure: HAMMER TOE CORRECTION L 2ND AND 3RD ;  Surgeon: Albertine Patricia, DPM;  Location: [place];  Service: Podiatry;  Laterality: Left;  WITH LOCAL  . HERNIA REPAIR  1988  . NECK SURGERY  2006   fusion  . SPINAL CORD STIMULATOR IMPLANT    . TONSILLECTOMY  1985    Current Outpatient Prescriptions on File Prior to Visit  Medication Sig Dispense Refill  . Cholecalciferol (VITAMIN D3) 5000 UNITS CAPS Take 1 capsule by mouth daily.    . Cyanocobalamin (VITAMIN B-12) 500 MCG SUBL Place 500 mcg  under the tongue daily.    . cyclobenzaprine (FLEXERIL) 10 MG tablet Take 10 mg by mouth 3 (three) times daily as needed for muscle spasms.    Marland Kitchen desvenlafaxine (PRISTIQ) 100 MG 24 hr tablet TAKE 1 TABLET EVERY DAY 30 tablet 5  . diazepam (VALIUM) 10 MG tablet TAKE ONE TABLET FOUR TIMES A DAY 120 tablet 3  . estradiol (ESTRACE) 1 MG tablet TAKE 1 TABLET EVERY DAY 90 tablet 4  . LINZESS 145 MCG CAPS capsule TAKE ONE CAPSULE DAILY 30 capsule 12  . lisinopril (PRINIVIL,ZESTRIL) 10 MG tablet Take 1 tablet (10 mg total) by mouth daily. 90 tablet 1  . meloxicam (MOBIC) 15 MG tablet     . oxyCODONE (ROXICODONE) 15 MG immediate release tablet Take 1-2 tablets (15-30 mg total) by mouth every 4 (four) hours as needed for pain. 180 tablet 0  . pravastatin (PRAVACHOL) 40 MG tablet TAKE 1 TABLET BY MOUTH DAILY 30 tablet 12  . pregabalin (LYRICA) 75 MG capsule TAKE ONE CAPSULE THREE TIMES A DAY 90 capsule 5  . RESTASIS 0.05 % ophthalmic emulsion Apply 1  drop to eye 2 (two) times daily.     No current facility-administered medications on file prior to visit.     Allergies  Allergen Reactions  . Hydrocodone Itching  . Mirtazapine     Weight gain    Social History   Social History  . Marital status: Married    Spouse name: N/A  . Number of children: N/A  . Years of education: N/A   Occupational History  . Not on file.   Social History Main Topics  . Smoking status: Never Smoker  . Smokeless tobacco: Never Used  . Alcohol use 0.0 oz/week     Comment: rare  . Drug use: No  . Sexual activity: Yes    Birth control/ protection: Surgical   Other Topics Concern  . Not on file   Social History Narrative  . No narrative on file    Family History  Problem Relation Age of Onset  . Diabetes Father   . Hypertension Father   . CAD Father   . Diabetes Sister     non-Insulin dependent diabetes mellitus  . Heart disease Brother   . Cancer Daughter     cancer of the cervix at age 49-18  . Heart disease Brother   . Breast cancer Paternal Aunt   . Colon cancer Paternal Uncle   . Ovarian cancer Neg Hx     The following portions of the patient's history were reviewed and updated as appropriate: allergies, current medications, past family history, past medical history, past social history, past surgical history and problem list.  Review of Systems Review of Systems -Per history of present illness Objective:   BP 107/74   Pulse (!) 108   Ht 5\' 8"  (1.727 m)   Wt 208 lb 12.8 oz (94.7 kg)   BMI 31.75 kg/m  CONSTITUTIONAL: Well-developed, well-nourished female in no acute distress.  HENT:  Normocephalic, atraumatic.  NECK: Not examined  SKIN: Skin is warm and dry. No rash noted. Not diaphoretic. No erythema. No pallor. [place]: Alert and oriented to person, place, and time. PSYCHIATRIC: Normal mood and affect. Normal behavior. Normal judgment and thought content. CARDIOVASCULAR:Not Examined RESPIRATORY: Not  Examined BREASTS: Not Examined ABDOMEN: Soft, non distended; Non tender.  No Organomegaly. PELVIC:             External Genitalia: Normal  BUS: Normal             Vagina: Second degree cystocele; good support at the urethrovesical junction; large rectocele; questionable enterocele; fair estrogen effect of vaginal mucosa             Cervix: Surgically absent             Uterus: Surgically absent             Adnexa: Normal; nonpalpable nontender             RV: External Exam NormaI, No Rectal Masses and Normal Sphincter tone  MUSCULOSKELETAL: Normal range of motion. No tenderness.  No cyanosis, clubbing, or edema.     Assessment:   1. Rectocele, Large, symptomatic, refractory to conservative management with dietary modification, stool softeners, and fiber  2. Cystocele, midline, minimally symptomatic  3. Unstable bladder     Plan:   1. Start Myrbetriq 2. Continue with dietary fiber, water, stool softeners 3. Schedule for posterior colporrhaphy with enterocele ligation and possible anterior colporrhaphy 4. Return for preop appointment 1 week before surgery  A total of 15 minutes were spent face-to-face with the patient during this encounter and over half of that time dealt with counseling and coordination of care.  Brayton Mars, MD  Note: This dictation was prepared with Dragon dictation along with smaller phrase technology. Any transcriptional errors that result from this process are unintentional.

## 2016-05-30 NOTE — Progress Notes (Signed)
Patient: Courtney Ross Female    DOB: 01-11-1956   60 y.o.   MRN: [id number] Visit Date: 05/30/2016  Today's Provider: Lelon Huh, MD   Chief Complaint  Patient presents with  . Hypertension    follow up  . Pain    follow up  . Hyperlipidemia    follow up   Subjective:    HPI  Patient presents today for follow up multiple chronic conditions below and AWV by nurse health advisor.   Hypertension, follow-up:  BP Readings from Last 3 Encounters:  05/30/16 128/80  05/29/16 107/74  04/10/16 120/77    She was last seen for hypertension 6 months ago.  BP at that visit was 104/66. Management since that visit includes no changes. She reports good compliance with treatment. She is not having side effects.  She is exercising. She is  adherent to low salt diet.   Outside blood pressures are normal per patient. She is experiencing none.  Patient denies chest pain, chest pressure/discomfort, claudication, dyspnea, exertional chest pressure/discomfort, fatigue, irregular heart beat, lower extremity edema, near-syncope, orthopnea, palpitations, paroxysmal nocturnal dyspnea, syncope and tachypnea.   Cardiovascular risk factors include dyslipidemia and hypertension.  Use of agents associated with hypertension: NSAIDS.     Weight trend: fluctuating a bit Wt Readings from Last 3 Encounters:  05/30/16 213 lb (96.6 kg)  05/29/16 208 lb 12.8 oz (94.7 kg)  04/10/16 208 lb 1.6 oz (94.4 kg)    Current diet: healthy diet  ------------------------------------------------------------------------  Lipid/Cholesterol, Follow-up:   Last seen for this6 months ago.  Management changes since that visit include no changes . Marland Kitchen Last Lipid Panel:    Component Value Date/Time   CHOL 195 11/29/2015 1225   TRIG 189 (H) 11/29/2015 1225   HDL 67 11/29/2015 1225   CHOLHDL 2.9 11/29/2015 1225   Pierce 90 11/29/2015 1225    Risk factors for vascular disease include  hypercholesterolemia and hypertension  She reports good compliance with treatment. She is not having side effects.  Current symptoms include none and have been stable. Weight trend: fluctuating a bit Prior visit with dietician: no Current diet: in general, a "healthy" diet   Current exercise: walking  Wt Readings from Last 3 Encounters:  05/30/16 213 lb (96.6 kg)  05/29/16 208 lb 12.8 oz (94.7 kg)  04/10/16 208 lb 1.6 oz (94.4 kg)    ------------------------------------------------------------------- Follow up Chronic pain:  Patient was last seen 6 months ago for this problem and no changes were made. Patient reports good compliance with treatment, good tolerance and good symptom control. She has had neurostimular place for right groin pain which has been effective, but is not designed to help with back pain. She continues to follow up periodically with Dr. Carloyn Manner  Patient Active Problem List   Diagnosis Date Noted  . Rectocele 04/11/2016  . Cystocele, midline 04/11/2016  . Status post abdominal hysterectomy 04/10/2016  . Menopause 04/10/2016  . Essential hypertension 05/29/2015  . 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 Chronic pain of lower extremity (Right side) 05/09/2015  . Chronic lumbar radicular pain (Right side) 05/09/2015  . Chronic right groin pain 05/09/2015  . Complication of implanted electronic neurostimulator of spinal cord ([place]) (battery depletion) 05/09/2015  . Encounter for interrogation of neurostimulator 05/09/2015  . 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 HCV antibody positive 05/08/2015  . H/O adenomatous polyp of colon 05/08/2015  . Headache, migraine 05/08/2015  .  Restless legs syndrome 05/08/2015  . Rotator cuff syndrome of shoulder and allied disorders 05/08/2015  . Hand tendinitis 05/08/2015  . Anxiety 12/12/2014  . Chronic pain associated with significant psychosocial dysfunction 11/22/2014    . 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 Atypical squamous cells of undetermined significance on cytologic smear of vagina (ASC-US) 11/22/2014  . Snapping thumb syndrome 11/22/2014  . Menopausal and perimenopausal disorder 12/03/2009  . Obesity 12/03/2009  . Hernia, inguinal, unilateral 05/01/2009  . Dyslipidemia 02/25/2007  . Degeneration of intervertebral disc of lumbosacral region 08/19/2006  . Degeneration of lumbar or lumbosacral intervertebral disc 08/19/2006  . Major depressive disorder with single episode 04/16/2006  . Fatty liver 06/23/2005      Follow up Restless legs syndrome:  Patient was last seen 6 months ago and no changes were made. Patient reports good compliance with treatment, good tolerance and good symptom control.      Allergies  Allergen Reactions  . Hydrocodone Itching  . Mirtazapine     Weight gain     Current Outpatient Prescriptions:  .  Cholecalciferol (VITAMIN D3) 5000 UNITS CAPS, Take 1 capsule by mouth daily., Disp: , Rfl:  .  Cyanocobalamin (VITAMIN B-12) 500 MCG SUBL, Place 500 mcg under the tongue daily., Disp: , Rfl:  .  cyclobenzaprine (FLEXERIL) 10 MG tablet, Take 10 mg by mouth 3 (three) times daily as needed for muscle spasms., Disp: , Rfl:  .  desvenlafaxine (PRISTIQ) 100 MG 24 hr tablet, TAKE 1 TABLET EVERY DAY, Disp: 30 tablet, Rfl: 5 .  diazepam (VALIUM) 10 MG tablet, TAKE ONE TABLET FOUR TIMES A DAY, Disp: 120 tablet, Rfl: 3 .  estradiol (ESTRACE) 1 MG tablet, TAKE 1 TABLET EVERY DAY, Disp: 90 tablet, Rfl: 4 .  LINZESS 145 MCG CAPS capsule, TAKE ONE CAPSULE DAILY, Disp: 30 capsule, Rfl: 12 .  lisinopril (PRINIVIL,ZESTRIL) 10 MG tablet, Take 1 tablet (10 mg total) by mouth daily., Disp: 90 tablet, Rfl: 1 .  meloxicam (MOBIC) 15 MG tablet, , Disp: , Rfl:  .  mirabegron ER (MYRBETRIQ) 25 MG TB24 tablet, Take 1 tablet (25 mg total) by mouth daily., Disp: 30 tablet, Rfl: 6 .  oxyCODONE (ROXICODONE) 15 MG  immediate release tablet, Take 1-2 tablets (15-30 mg total) by mouth every 4 (four) hours as needed for pain., Disp: 180 tablet, Rfl: 0 .  pravastatin (PRAVACHOL) 40 MG tablet, TAKE 1 TABLET BY MOUTH DAILY, Disp: 30 tablet, Rfl: 12 .  pregabalin (LYRICA) 75 MG capsule, TAKE ONE CAPSULE THREE TIMES A DAY, Disp: 90 capsule, Rfl: 5 .  RESTASIS 0.05 % ophthalmic emulsion, Apply 1 drop to eye 2 (two) times daily., Disp: , Rfl:   Review of Systems  Constitutional: Negative for appetite change, chills, fatigue and fever.  Respiratory: Negative for chest tightness and shortness of breath.   Cardiovascular: Negative for chest pain, palpitations and leg swelling.  Gastrointestinal: Negative for abdominal pain, nausea and vomiting.  Musculoskeletal: Positive for back pain.  Neurological: Negative for dizziness and weakness.  Psychiatric/Behavioral: Positive for dysphoric mood.    Social History  Substance Use Topics  . Smoking status: Never Smoker  . Smokeless tobacco: Never Used  . Alcohol use 0.0 oz/week     Comment: rare   Objective:     Vitals:   BP 128/80 (BP Location: Left Arm)   Pulse 92   Temp 97 F (36.1 C) (Oral)   Ht 5\' 8"  (1.727 m)   Wt 213 lb (96.6  kg)   BMI 32.39 kg/m   BSA 2.15 m     Physical Exam   General Appearance:    Alert, cooperative, no distress  Eyes:    PERRL, conjunctiva/corneas clear, EOM's intact       Lungs:     Clear to auscultation bilaterally, respirations unlabored  Heart:    Regular rate and rhythm  Neurologic:   Awake, alert, oriented x 3. No apparent focal neurological           defect.   MS:   Tender over lower lumbar spine.        Assessment & Plan:     1. Essential hypertension Well controlled.  Continue current medications.    2. Fatty liver  3. Opiate use Stable on current medications for extended period of time. Continue regular follow up Dr. Carloyn Manner [facility] [place]  4. Degeneration of lumbar or lumbosacral  intervertebral disc   5. Degeneration of intervertebral disc of lumbosacral region   6. Major depressive disorder with single episode, in partial remission ([place]) She feels that pristique is not working as well is it did in the past. Did not like fluoxetine in the past. Will add bupropion - buPROPion (WELLBUTRIN SR) 100 MG 12 hr tablet; Take 1 tablet (100 mg total) by mouth 2 (two) times daily.  Dispense: 60 tablet; Refill: 2  She also complains of sores in her nose that come back from time to time which have responded well to mupiricin in the past, which was refilled today.        Lelon Huh, MD  [facility]

## 2016-05-30 NOTE — Patient Instructions (Signed)
Courtney Ross , Thank you for taking time to come for your Medicare Wellness Visit. I appreciate your ongoing commitment to your health goals. Please review the following plan we discussed and let me know if I can assist you in the future.   These are the goals we discussed: Goals    . Increase water intake          Starting 05/30/16, I will continue drinking 5 glasses of water a day.        This is a list of the screening recommended for you and due dates:  Health Maintenance  Topic Date Due  .  Hepatitis C: One time screening is recommended by Center for Disease Control  (CDC) for  adults born from [birth year] through 1966.   1955-10-25  . HIV Screening  06/02/1970  . Flu Shot  01/22/2016  . Mammogram  02/03/2018  . Pap Smear  04/11/2019  . Colon Cancer Screening  01/13/2023  . Tetanus Vaccine  09/20/2025  . Shingles Vaccine  Completed   Preventive Care for Adults  A healthy lifestyle and preventive care can promote health and wellness. Preventive health guidelines for adults include the following key practices.  . A routine yearly physical is a good way to check with your health care provider about your health and preventive screening. It is a chance to share any concerns and updates on your health and to receive a thorough exam.  . Visit your dentist for a routine exam and preventive care every 6 months. Brush your teeth twice a day and floss once a day. Good oral hygiene prevents tooth decay and gum disease.  . The frequency of eye exams is based on your age, health, family medical history, use  of contact lenses, and other factors. Follow your health care provider's ecommendations for frequency of eye exams.  . Eat a healthy diet. Foods like vegetables, fruits, whole grains, low-fat dairy products, and lean protein foods contain the nutrients you need without too many calories. Decrease your intake of foods high in solid fats, added sugars, and salt. Eat the right amount of calories for  you. Get information about a proper diet from your health care provider, if necessary.  . Regular physical exercise is one of the most important things you can do for your health. Most adults should get at least 150 minutes of moderate-intensity exercise (any activity that increases your heart rate and causes you to sweat) each week. In addition, most adults need muscle-strengthening exercises on 2 or more days a week.  Silver Sneakers may be a benefit available to you. To determine eligibility, you may visit the website: www.silversneakers.com or contact program at [phone] Mon-Fri between 8AM-8PM.   . Maintain a healthy weight. The body mass index (BMI) is a screening tool to identify possible weight problems. It provides an estimate of body fat based on height and weight. Your health care provider can find your BMI and can help you achieve or maintain a healthy weight.   For adults 20 years and older: ? A BMI below 18.5 is considered underweight. ? A BMI of 18.5 to 24.9 is normal. ? A BMI of 25 to 29.9 is considered overweight. ? A BMI of 30 and above is considered obese.   . Maintain normal blood lipids and cholesterol levels by exercising and minimizing your intake of saturated fat. Eat a balanced diet with plenty of fruit and vegetables. Blood tests for lipids and cholesterol should begin at age 7  and be repeated every 5 years. If your lipid or cholesterol levels are high, you are over 50, or you are at high risk for heart disease, you may need your cholesterol levels checked more frequently. Ongoing high lipid and cholesterol levels should be treated with medicines if diet and exercise are not working.  . If you smoke, find out from your health care provider how to quit. If you do not use tobacco, please do not start.  . If you choose to drink alcohol, please do not consume more than 2 drinks per day. One drink is considered to be 12 ounces (355 mL) of beer, 5 ounces (148 mL) of  wine, or 1.5 ounces (44 mL) of liquor.  . If you are 4-59 years old, ask your health care provider if you should take aspirin to prevent strokes.  . Use sunscreen. Apply sunscreen liberally and repeatedly throughout the day. You should seek shade when your shadow is shorter than you. Protect yourself by wearing long sleeves, pants, a wide-brimmed hat, and sunglasses year round, whenever you are outdoors.  . Once a month, do a whole body skin exam, using a mirror to look at the skin on your back. Tell your health care provider of new moles, moles that have irregular borders, moles that are larger than a pencil eraser, or moles that have changed in shape or color.

## 2016-05-30 NOTE — Progress Notes (Signed)
Subjective:   Courtney Ross is a 60 y.o. female who presents for Medicare Annual (Subsequent) preventive examination.  Review of Systems:  N/A  Cardiac Risk Factors include: dyslipidemia;hypertension;obesity (BMI >30kg/m2)     Objective:     Vitals: BP 128/80 (BP Location: Left Arm)   Pulse 92   Temp 97 F (36.1 C) (Oral)   Ht 5\' 8"  (1.727 m)   Wt 213 lb (96.6 kg)   BMI 32.39 kg/m   Body mass index is 32.39 kg/m.   Tobacco History  Smoking Status  . Never Smoker  Smokeless Tobacco  . Never Used     Counseling given: Not Answered   Past Medical History:  Diagnosis Date  . Anxiety   . Back pain   . Degenerative disc disease, lumbar   . Depression   . Fatty liver   . Headache    migraines  . Headache, migraine 05/08/2015  . High cholesterol   . Polycystic ovarian disease   . PONV (postoperative nausea and vomiting)   . Restless leg syndrome   . Spasm 01/29/2009   Past Surgical History:  Procedure Laterality Date  . [place]  . APPENDECTOMY  1988  . ARTHRODESIS METATARSALPHALANGEAL JOINT (MTPJ) Left 12/14/2014   Procedure: ARTHRODESIS METATARSALPHALANGEAL JOINT (MTPJ) 1ST;  Surgeon: Albertine Patricia, DPM;  Location: [facility];  Service: Podiatry;  Laterality: Left;  LMA  . BACK SURGERY  [phone]   x3  . BLADDER SUSPENSION  2006  . BREAST BIOPSY Left   . BREAST SURGERY  2005   lumpectomy  . CARPAL TUNNEL RELEASE Bilateral 02/26/07,05/21/07  . COLPORRHAPHY    . DILATION AND CURETTAGE OF UTERUS    . [place]  . FOOT SURGERY    . HAMMER TOE SURGERY Left 03/22/2015   Procedure: HAMMER TOE CORRECTION L 2ND AND 3RD ;  Surgeon: Albertine Patricia, DPM;  Location: [facility];  Service: Podiatry;  Laterality: Left;  WITH LOCAL  . HERNIA REPAIR  1988  . NECK SURGERY  2006   fusion  . SPINAL CORD STIMULATOR IMPLANT    . TONSILLECTOMY  1985   Family History  Problem Relation Age of Onset  .  Diabetes Father   . Hypertension Father   . CAD Father   . Diabetes Sister     non-Insulin dependent diabetes mellitus  . Heart disease Brother   . Cancer Daughter     cancer of the cervix at age 41-18  . Heart disease Brother   . Breast cancer Paternal Aunt   . Colon cancer Paternal Uncle   . Ovarian cancer Neg Hx    History  Sexual Activity  . Sexual activity: Yes  . Birth control/ protection: Surgical    Outpatient Encounter Prescriptions as of 05/30/2016  Medication Sig  . Cholecalciferol (VITAMIN D3) 5000 UNITS CAPS Take 1 capsule by mouth daily.  . Cyanocobalamin (VITAMIN B-12) 500 MCG SUBL Place 500 mcg under the tongue daily.  . cyclobenzaprine (FLEXERIL) 10 MG tablet Take 10 mg by mouth 3 (three) times daily as needed for muscle spasms.  Marland Kitchen desvenlafaxine (PRISTIQ) 100 MG 24 hr tablet TAKE 1 TABLET EVERY DAY  . diazepam (VALIUM) 10 MG tablet TAKE ONE TABLET FOUR TIMES A DAY  . estradiol (ESTRACE) 1 MG tablet TAKE 1 TABLET EVERY DAY  . LINZESS 145 MCG CAPS capsule TAKE ONE CAPSULE DAILY  . lisinopril (PRINIVIL,ZESTRIL) 10 MG tablet Take  1 tablet (10 mg total) by mouth daily.  . meloxicam (MOBIC) 15 MG tablet   . mirabegron ER (MYRBETRIQ) 25 MG TB24 tablet Take 1 tablet (25 mg total) by mouth daily.  Marland Kitchen oxyCODONE (ROXICODONE) 15 MG immediate release tablet Take 1-2 tablets (15-30 mg total) by mouth every 4 (four) hours as needed for pain.  . pravastatin (PRAVACHOL) 40 MG tablet TAKE 1 TABLET BY MOUTH DAILY  . pregabalin (LYRICA) 75 MG capsule TAKE ONE CAPSULE THREE TIMES A DAY  . RESTASIS 0.05 % ophthalmic emulsion Apply 1 drop to eye 2 (two) times daily.   No facility-administered encounter medications on file as of 05/30/2016.     Activities of Daily Living In your present state of health, do you have any difficulty performing the following activities: 05/30/2016  Hearing? N  Vision? Y  Difficulty concentrating or making decisions? N  Walking or climbing stairs? N    Dressing or bathing? N  Doing errands, shopping? N  Preparing Food and eating ? N  Using the Toilet? N  In the past six months, have you accidently leaked urine? Y  Do you have problems with loss of bowel control? N  Managing your Medications? N  Managing your Finances? N  Housekeeping or managing your Housekeeping? N  Some recent data might be hidden    Patient Care Team: Birdie Sons, MD as PCP - General (Family Medicine) Glenna Fellows, MD as Attending Physician (Neurosurgery) Thelma Comp, OD as Consulting Physician (Optometry) Brayton Mars, MD as Consulting Physician (Obstetrics and Gynecology) Murrell Redden, MD as Consulting Physician (Urology) Albertine Patricia, DPM as Attending Physician (Podiatry)    Assessment:     Exercise Activities and Dietary recommendations Current Exercise Habits: Home exercise routine, Type of exercise: walking, Time (Minutes): 45, Frequency (Times/Week): 3, Weekly Exercise (Minutes/Week): 135, Intensity: Mild  Goals    . Increase water intake          Starting 05/30/16, I will continue drinking 5 glasses of water a day.       Fall Risk Fall Risk  05/30/2016 05/08/2015  Falls in the past year? No No   Depression Screen PHQ 2/9 Scores 05/30/2016 05/08/2015  PHQ - 2 Score 1 0     Cognitive Function     6CIT Screen 05/30/2016  What Year? 0 points  What month? 0 points  What time? 0 points  Count back from 20 0 points  Months in reverse 0 points  Repeat phrase 4 points  Total Score 4    Immunization History  Administered Date(s) Administered  . Influenza-Unspecified 04/23/2013  . Pneumococcal Conjugate-13 05/11/2014  . Tdap 09/21/2015  . Zoster 01/13/2011   Screening Tests Health Maintenance  Topic Date Due  . MAMMOGRAM  02/03/2018  . PAP SMEAR  04/11/2019  . COLONOSCOPY  01/13/2023  . TETANUS/TDAP  09/20/2025  . INFLUENZA VACCINE  Completed  . ZOSTAVAX  Completed  . Hepatitis C Screening  Completed  . HIV  Screening  Completed      Plan:  I have personally reviewed and addressed the Medicare Annual Wellness questionnaire and have noted the following in the patient's chart:  A. Medical and social history B. Use of alcohol, tobacco or illicit drugs  C. Current medications and supplements D. Functional ability and status E.  Nutritional status F.  Physical activity G. Advance directives H. List of other physicians I.  Hospitalizations, surgeries, and ER visits in previous 12 months J.  Vitals K. Screenings  such as hearing and vision if needed, cognitive and depression L. Referrals and appointments - none  In addition, I have reviewed and discussed with patient certain preventive protocols, quality metrics, and best practice recommendations. A written personalized care plan for preventive services as well as general preventive health recommendations were provided to patient.  See attached scanned questionnaire for additional information.   Signed,  Fabio Neighbors, LPN Nurse Health Advisor  MD Recommendations: none.  I have reviewed the health advisor's note, was available for consultation, and agree with documentation and plan  Lelon Huh, MD

## 2016-05-31 LAB — PAIN MGT SCRN (14 DRUGS), UR
Amphetamine Screen, Ur: NEGATIVE ng/mL
Barbiturate Screen, Ur: NEGATIVE ng/mL
Benzodiazepine Screen, Urine: POSITIVE ng/mL
Buprenorphine, Urine: NEGATIVE ng/mL
Cannabinoids Ur Ql Scn: NEGATIVE ng/mL
Cocaine(Metab.)Screen, Urine: NEGATIVE ng/mL
Creatinine(Crt), U: 210.5 mg/dL (ref 20.0–300.0)
Fentanyl, Urine: NEGATIVE pg/mL
Meperidine Screen, Urine: NEGATIVE ng/mL
Methadone Scn, Ur: NEGATIVE ng/mL
Opiate Scrn, Ur: POSITIVE ng/mL
Oxycodone+Oxymorphone Ur Ql Scn: POSITIVE ng/mL
PCP Scrn, Ur: NEGATIVE ng/mL
Ph of Urine: 5.2 (ref 4.5–8.9)
Propoxyphene, Screen: NEGATIVE ng/mL
Tramadol Ur Ql Scn: NEGATIVE ng/mL

## 2016-05-31 LAB — HIV ANTIBODY (ROUTINE TESTING W REFLEX): HIV Screen 4th Generation wRfx: NONREACTIVE

## 2016-06-10 ENCOUNTER — Telehealth: Payer: Self-pay | Admitting: Family Medicine

## 2016-06-10 DIAGNOSIS — M5137 Other intervertebral disc degeneration, lumbosacral region: Secondary | ICD-10-CM

## 2016-06-10 MED ORDER — OXYCODONE HCL 15 MG PO TABS: 15.0000 mg | ORAL_TABLET | ORAL | 0 refills | Status: DC | PRN

## 2016-06-10 NOTE — Telephone Encounter (Signed)
Pt contacted office for refill request on the following medications: oxyCODONE (ROXICODONE) 15 MG immediate release tablet Last Rx: 05/13/16 Last OV: 05/30/16 Please advise. Thanks TNP

## 2016-06-14 ENCOUNTER — Other Ambulatory Visit: Payer: Self-pay | Admitting: Family Medicine

## 2016-07-01 ENCOUNTER — Encounter: Payer: Self-pay | Admitting: Obstetrics and Gynecology

## 2016-07-01 ENCOUNTER — Ambulatory Visit (INDEPENDENT_AMBULATORY_CARE_PROVIDER_SITE_OTHER): Payer: Medicare Other | Admitting: Obstetrics and Gynecology

## 2016-07-01 ENCOUNTER — Encounter
Admission: RE | Admit: 2016-07-01 | Discharge: 2016-07-01 | Disposition: A | Discharge status: Home / Self Care | Payer: Medicare Other | Source: Ambulatory Visit | Attending: Obstetrics and Gynecology | Admitting: Obstetrics and Gynecology

## 2016-07-01 VITALS — BP 140/84 | HR 94 | Ht 68.0 in | Wt 211.5 lb

## 2016-07-01 DIAGNOSIS — N3289 Other specified disorders of bladder: Secondary | ICD-10-CM

## 2016-07-01 DIAGNOSIS — Z01812 Encounter for preprocedural laboratory examination: Secondary | ICD-10-CM | POA: Diagnosis not present

## 2016-07-01 DIAGNOSIS — G8929 Other chronic pain: Secondary | ICD-10-CM | POA: Diagnosis not present

## 2016-07-01 DIAGNOSIS — Z4542 Encounter for adjustment and management of neuropacemaker (brain) (peripheral nerve) (spinal cord): Secondary | ICD-10-CM | POA: Insufficient documentation

## 2016-07-01 DIAGNOSIS — R1031 Right lower quadrant pain: Secondary | ICD-10-CM | POA: Insufficient documentation

## 2016-07-01 DIAGNOSIS — Z01810 Encounter for preprocedural cardiovascular examination: Secondary | ICD-10-CM | POA: Insufficient documentation

## 2016-07-01 DIAGNOSIS — I10 Essential (primary) hypertension: Secondary | ICD-10-CM | POA: Insufficient documentation

## 2016-07-01 DIAGNOSIS — E669 Obesity, unspecified: Secondary | ICD-10-CM | POA: Insufficient documentation

## 2016-07-01 DIAGNOSIS — T859XXA Unspecified complication of internal prosthetic device, implant and graft, initial encounter: Secondary | ICD-10-CM | POA: Diagnosis not present

## 2016-07-01 DIAGNOSIS — N816 Rectocele: Secondary | ICD-10-CM | POA: Diagnosis not present

## 2016-07-01 DIAGNOSIS — K4090 Unilateral inguinal hernia, without obstruction or gangrene, not specified as recurrent: Secondary | ICD-10-CM | POA: Insufficient documentation

## 2016-07-01 DIAGNOSIS — N8111 Cystocele, midline: Secondary | ICD-10-CM | POA: Insufficient documentation

## 2016-07-01 DIAGNOSIS — K760 Fatty (change of) liver, not elsewhere classified: Secondary | ICD-10-CM | POA: Insufficient documentation

## 2016-07-01 DIAGNOSIS — Z90710 Acquired absence of both cervix and uterus: Secondary | ICD-10-CM

## 2016-07-01 DIAGNOSIS — Z01818 Encounter for other preprocedural examination: Secondary | ICD-10-CM

## 2016-07-01 HISTORY — DX: Family history of other specified conditions: Z84.89

## 2016-07-01 HISTORY — DX: Essential (primary) hypertension: I10

## 2016-07-01 HISTORY — DX: Gastro-esophageal reflux disease without esophagitis: K21.9

## 2016-07-01 LAB — RAPID HIV SCREEN (HIV 1/2 AB+AG)
HIV 1/2 Antibodies: NONREACTIVE
HIV-1 P24 Antigen - HIV24: NONREACTIVE

## 2016-07-01 LAB — CBC WITH DIFFERENTIAL/PLATELET
Basophils Absolute: 0 10*3/uL (ref 0–0.1)
Basophils Relative: 1 %
Eosinophils Absolute: 0.2 10*3/uL (ref 0–0.7)
Eosinophils Relative: 3 %
HCT: 38.3 % (ref 35.0–47.0)
Hemoglobin: 12.6 g/dL (ref 12.0–16.0)
Lymphocytes Relative: 33 %
Lymphs Abs: 1.9 10*3/uL (ref 1.0–3.6)
MCH: 25.4 pg — ABNORMAL LOW (ref 26.0–34.0)
MCHC: 33 g/dL (ref 32.0–36.0)
MCV: 77 fL — ABNORMAL LOW (ref 80.0–100.0)
Monocytes Absolute: 0.7 10*3/uL (ref 0.2–0.9)
Monocytes Relative: 11 %
Neutro Abs: 3.1 10*3/uL (ref 1.4–6.5)
Neutrophils Relative %: 52 %
Platelets: 306 10*3/uL (ref 150–440)
RBC: 4.97 MIL/uL (ref 3.80–5.20)
RDW: 14.7 % — ABNORMAL HIGH (ref 11.5–14.5)
WBC: 5.9 10*3/uL (ref 3.6–11.0)

## 2016-07-01 NOTE — Progress Notes (Signed)
Subjective:  PREOPERATIVE HISTORY AND PHYSICAL   Date of surgery: 07/07/2016 Procedure: Posterior colporrhaphy with enterocele ligation, possible anterior colporrhaphy Diagnosis: 1. Symptomatic rectocele 2. Enterocele 3. Cystocele   Patient is a 61 y.o. [id number] scheduled for posterior colporrhaphy with enterocele ligation on 07/08/2011. Patient also has a cystocele and may require anterior colporrhaphy as well. Patient is symptomatic with rectocele with need for splinting despite use of stool softeners and increased fiber in diet. Patient is not currently sexually active.  Gynecologic History No LMP recorded. Patient has had a hysterectomy. Contraception: status post hysterectomy Last Pap: 2016. Results were: normal Last mammogram: 2017. Results were: normal  OB History    Gravida Para Term Preterm AB Living   7 4 4   3 4    SAB TAB Ectopic Multiple Live Births   2   1   4       No LMP recorded. Patient has had a hysterectomy.    Past Medical History:  Diagnosis Date  . Anxiety   . Back pain   . Degenerative disc disease, lumbar   . Depression   . Fatty liver   . Headache    migraines  . Headache, migraine 05/08/2015  . High cholesterol   . Polycystic ovarian disease   . PONV (postoperative nausea and vomiting)   . Restless leg syndrome   . Spasm 01/29/2009    Past Surgical History:  Procedure Laterality Date  . [place]  . APPENDECTOMY  1988  . ARTHRODESIS METATARSALPHALANGEAL JOINT (MTPJ) Left 12/14/2014   Procedure: ARTHRODESIS METATARSALPHALANGEAL JOINT (MTPJ) 1ST;  Surgeon: Albertine Patricia, DPM;  Location: [place];  Service: Podiatry;  Laterality: Left;  LMA  . BACK SURGERY  [phone]   x3  . BLADDER SUSPENSION  2006  . BREAST BIOPSY Left   . BREAST SURGERY  2005   lumpectomy  . CARPAL TUNNEL RELEASE Bilateral 02/26/07,05/21/07  . COLPORRHAPHY    . DILATION AND CURETTAGE OF UTERUS    . [place]  . FOOT SURGERY    . HAMMER TOE SURGERY Left 03/22/2015   Procedure: HAMMER TOE CORRECTION L 2ND AND 3RD ;  Surgeon: Albertine Patricia, DPM;  Location: [place];  Service: Podiatry;  Laterality: Left;  WITH LOCAL  . HERNIA REPAIR  1988  . NECK SURGERY  2006   fusion  . SPINAL CORD STIMULATOR IMPLANT    . TONSILLECTOMY  1985    OB History  Gravida Para Term Preterm AB Living  7 4 4   3 4   SAB TAB Ectopic Multiple Live Births  2   1   4     # Outcome Date GA Lbr Len/2nd Weight Sex Delivery Anes PTL Lv  7 Term 1984   6 lb 6.4 oz (2.903 kg) F Vag-Spont   LIV  6 Term 1980   8 lb 4.8 oz (3.765 kg) M Vag-Spont   LIV  5 Term 1977   7 lb (3.175 kg) M Vag-Spont   LIV  4 Term 1972   7 lb 9.6 oz (3.447 kg) M Vag-Spont   LIV  3 Ectopic           2 SAB           1 SAB               Social History   Social History  . Marital status: Married    Spouse name: N/A  .  Number of children: N/A  . Years of education: N/A   Social History Main Topics  . Smoking status: Never Smoker  . Smokeless tobacco: Never Used  . Alcohol use 0.0 oz/week     Comment: rare  . Drug use: No  . Sexual activity: Yes    Birth control/ protection: Surgical   Other Topics Concern  . None   Social History Narrative  . None    Family History  Problem Relation Age of Onset  . Diabetes Father   . Hypertension Father   . CAD Father   . Diabetes Sister     non-Insulin dependent diabetes mellitus  . Heart disease Brother   . Cancer Daughter     cancer of the cervix at age 27-18  . Heart disease Brother   . Breast cancer Paternal Aunt   . Colon cancer Paternal Uncle   . Ovarian cancer Neg Hx      (Not in a hospital admission)  Allergies  Allergen Reactions  . Hydrocodone Itching  . Mirtazapine     Weight gain    Review of Systems Constitutional: No recent fever/chills/sweats Respiratory: No recent cough/bronchitis Cardiovascular: No chest pain Gastrointestinal: No recent  nausea/vomiting/diarrhea Genitourinary: No UTI symptoms Hematologic/lymphatic:No history of coagulopathy or recent blood thinner use    Objective:    BP 140/84   Pulse 94   Ht 5\' 8"  (1.727 m)   Wt 211 lb 8 oz (95.9 kg)   BMI 32.16 kg/m   General:   Normal  Skin:   normal  HEENT:  Normal  Neck:  Supple without Adenopathy or Thyromegaly  Lungs:   Heart:              Breasts:   Abdomen:  Pelvis:  M/S   Extremeties:  Neuro:    clear to auscultation bilaterally   Normal without murmur   Not Examined   soft, non-tender; bowel sounds normal; no masses,  no organomegaly   Exam deferred to OR  No CVAT  Warm/Dry   Normal       05/29/2016 PELVIC: External Genitalia: Normal BUS: Normal Vagina: Second degree cystocele; good support at the urethrovesical junction; large rectocele; questionable enterocele; fair estrogen effect of vaginal mucosa Cervix: Surgically absent Uterus: Surgically absent Adnexa: Normal; nonpalpable nontender RV: External Exam NormaI, No Rectal Masses and Normal Sphincter tone  Assessment:    1. Symptomatic rectocele 2. Probable enterocele 3. Second degree cystocele with good support at the urethrovesical junction, asymptomatic   Plan:   Posterior colporrhaphy with enterocele ligation, possible anterior colporrhaphy  Preoperative counseling: Patient is to undergo posterior colporrhaphy with enterocele ligation and possible anterior colporrhaphy on 07/07/2016. She is understanding of the planned procedure and is aware of and is accepting of all surgical risks which include but are not limited to bleeding, infection, pelvic organ injury with need for repair, blood clot disorders, anesthesia risks, etc. All questions have been answered. Informed consent is given. Patient is ready and willing to proceed with surgery as scheduled. While patient is not currently sexually  active, we will maintain vaginal caliber in order to facilitate sexual intercourse in the future  Brayton Mars, MD  Note: This dictation was prepared with Dragon dictation along with smaller phrase technology. Any transcriptional errors that result from this process are unintentional.

## 2016-07-01 NOTE — Patient Instructions (Signed)
  Your procedure is scheduled UN:[id number] Jan 15.  2018. Report to Same Day Surgery. To find out your arrival time please call [phone] between 1PM - 3PM on Friday Jan. 12, 2018 .  Remember: Instructions that are not followed completely may result in serious medical risk, up to and including death, or upon the discretion of your surgeon and anesthesiologist your surgery may need to be rescheduled.    _x___ 1. Do not eat food or drink liquids after midnight. No gum chewing or  hard candies.     _x___ 2. No Alcohol for 24 hours before or after surgery.   ____ 3. Bring all medications with you on the day of surgery if instructed.    __x__ 4. Notify your doctor if there is any change in your medical condition     (cold, fever, infections).    _____ 5. No smoking 24 hours prior to surgery.     Do not wear jewelry, make-up, hairpins, clips or nail polish.  Do not wear lotions, powders, or perfumes.   Do not shave 48 hours prior to surgery. Men may shave face and neck.  Do not bring valuables to the hospital.    [facility] is not responsible for any belongings or valuables.               Contacts, dentures or bridgework may not be worn into surgery.  Leave your suitcase in the car. After surgery it may be brought to your room.  For patients admitted to the hospital, discharge time is determined by your treatment team.   Patients discharged the day of surgery will not be allowed to drive home.    Please read over the following fact sheets that you were given:   [facility] Preparing for Surgery  _x___ Take these medicines the morning of surgery with A SIP OF WATER:    1. buPROPion (WELLBUTRIN SR)   2. diazepam (VALIUM) if needed  3. lisinopril (PRINIVIL,ZESTRIL)  4. oxyCODONE (ROXICODONE) if needed.  5. pregabalin (LYRICA)    ____ Fleet Enema (as directed)   _x___ Use CHG Soap as directed on instruction sheet  ____ Use inhalers on the day of surgery and bring to hospital day  of surgery  ____ Stop metformin 2 days prior to surgery    ____ Take 1/2 of usual insulin dose the night before surgery and none on the morning of surgery.   _x__ Stop aspirin 7 days prior to surgery.   _x___ Stop Anti-inflammatories such as Advil, Aleve, Ibuprofen,meloxicam (MOBIC), Motrin, Naproxen, Naprosyn, Goodies powders or aspirin products. OK to  take Tylenol or oxyCODONE (ROXICODONE) .   _x___ Stop supplements:Cyanocobalamin (VITAMIN B-12),   until after surgery.    ____ Bring C-Pap to the hospital.

## 2016-07-01 NOTE — Patient Instructions (Signed)
1. Return in 1 week after surgery for postop check 

## 2016-07-01 NOTE — H&P (Signed)
Subjective:  PREOPERATIVE HISTORY AND PHYSICAL   Date of surgery: 07/07/2016 Procedure: Posterior colporrhaphy with enterocele ligation, possible anterior colporrhaphy Diagnosis: 1. Symptomatic rectocele 2. Enterocele 3. Cystocele   Patient is a 61 y.o. [id number] scheduled for posterior colporrhaphy with enterocele ligation on 07/08/2011. Patient also has a cystocele and may require anterior colporrhaphy as well. Patient is symptomatic with rectocele with need for splinting despite use of stool softeners and increased fiber in diet. Patient is not currently sexually active.  Gynecologic History No LMP recorded. Patient has had a hysterectomy. Contraception: status post hysterectomy Last Pap: 2016. Results were: normal Last mammogram: 2017. Results were: normal  OB History    Gravida Para Term Preterm AB Living   7 4 4   3 4    SAB TAB Ectopic Multiple Live Births   2   1   4       No LMP recorded. Patient has had a hysterectomy.    Past Medical History:  Diagnosis Date  . Anxiety   . Back pain   . Degenerative disc disease, lumbar   . Depression   . Fatty liver   . Headache    migraines  . Headache, migraine 05/08/2015  . High cholesterol   . Polycystic ovarian disease   . PONV (postoperative nausea and vomiting)   . Restless leg syndrome   . Spasm 01/29/2009    Past Surgical History:  Procedure Laterality Date  . [place]  . APPENDECTOMY  1988  . ARTHRODESIS METATARSALPHALANGEAL JOINT (MTPJ) Left 12/14/2014   Procedure: ARTHRODESIS METATARSALPHALANGEAL JOINT (MTPJ) 1ST;  Surgeon: Albertine Patricia, DPM;  Location: [place];  Service: Podiatry;  Laterality: Left;  LMA  . BACK SURGERY  [phone]   x3  . BLADDER SUSPENSION  2006  . BREAST BIOPSY Left   . BREAST SURGERY  2005   lumpectomy  . CARPAL TUNNEL RELEASE Bilateral 02/26/07,05/21/07  . COLPORRHAPHY    . DILATION AND CURETTAGE OF UTERUS    . [place]  . FOOT SURGERY    . HAMMER TOE SURGERY Left 03/22/2015   Procedure: HAMMER TOE CORRECTION L 2ND AND 3RD ;  Surgeon: Albertine Patricia, DPM;  Location: [place];  Service: Podiatry;  Laterality: Left;  WITH LOCAL  . HERNIA REPAIR  1988  . NECK SURGERY  2006   fusion  . SPINAL CORD STIMULATOR IMPLANT    . TONSILLECTOMY  1985    OB History  Gravida Para Term Preterm AB Living  7 4 4   3 4   SAB TAB Ectopic Multiple Live Births  2   1   4     # Outcome Date GA Lbr Len/2nd Weight Sex Delivery Anes PTL Lv  7 Term 1984   6 lb 6.4 oz (2.903 kg) F Vag-Spont   LIV  6 Term 1980   8 lb 4.8 oz (3.765 kg) M Vag-Spont   LIV  5 Term 1977   7 lb (3.175 kg) M Vag-Spont   LIV  4 Term 1972   7 lb 9.6 oz (3.447 kg) M Vag-Spont   LIV  3 Ectopic           2 SAB           1 SAB               Social History   Social History  . Marital status: Married    Spouse name: N/A  .  Number of children: N/A  . Years of education: N/A   Social History Main Topics  . Smoking status: Never Smoker  . Smokeless tobacco: Never Used  . Alcohol use 0.0 oz/week     Comment: rare  . Drug use: No  . Sexual activity: Yes    Birth control/ protection: Surgical   Other Topics Concern  . None   Social History Narrative  . None    Family History  Problem Relation Age of Onset  . Diabetes Father   . Hypertension Father   . CAD Father   . Diabetes Sister     non-Insulin dependent diabetes mellitus  . Heart disease Brother   . Cancer Daughter     cancer of the cervix at age 68-18  . Heart disease Brother   . Breast cancer Paternal Aunt   . Colon cancer Paternal Uncle   . Ovarian cancer Neg Hx      (Not in a hospital admission)  Allergies  Allergen Reactions  . Hydrocodone Itching  . Mirtazapine     Weight gain    Review of Systems Constitutional: No recent fever/chills/sweats Respiratory: No recent cough/bronchitis Cardiovascular: No chest pain Gastrointestinal: No recent  nausea/vomiting/diarrhea Genitourinary: No UTI symptoms Hematologic/lymphatic:No history of coagulopathy or recent blood thinner use    Objective:    BP 140/84   Pulse 94   Ht 5\' 8"  (1.727 m)   Wt 211 lb 8 oz (95.9 kg)   BMI 32.16 kg/m   General:   Normal  Skin:   normal  HEENT:  Normal  Neck:  Supple without Adenopathy or Thyromegaly  Lungs:   Heart:              Breasts:   Abdomen:  Pelvis:  M/S   Extremeties:  Neuro:    clear to auscultation bilaterally   Normal without murmur   Not Examined   soft, non-tender; bowel sounds normal; no masses,  no organomegaly   Exam deferred to OR  No CVAT  Warm/Dry   Normal       05/29/2016 PELVIC: External Genitalia: Normal BUS: Normal Vagina: Second degree cystocele; good support at the urethrovesical junction; large rectocele; questionable enterocele; fair estrogen effect of vaginal mucosa Cervix: Surgically absent Uterus: Surgically absent Adnexa: Normal; nonpalpable nontender RV: External Exam NormaI, No Rectal Masses and Normal Sphincter tone  Assessment:    1. Symptomatic rectocele 2. Probable enterocele 3. Second degree cystocele with good support at the urethrovesical junction, asymptomatic   Plan:   Posterior colporrhaphy with enterocele ligation, possible anterior colporrhaphy  Preoperative counseling: Patient is to undergo posterior colporrhaphy with enterocele ligation and possible anterior colporrhaphy on 07/07/2016. She is understanding of the planned procedure and is aware of and is accepting of all surgical risks which include but are not limited to bleeding, infection, pelvic organ injury with need for repair, blood clot disorders, anesthesia risks, etc. All questions have been answered. Informed consent is given. Patient is ready and willing to proceed with surgery as scheduled. While patient is not currently sexually  active, we will maintain vaginal caliber in order to facilitate sexual intercourse in the future  Brayton Mars, MD  Note: This dictation was prepared with Dragon dictation along with smaller phrase technology. Any transcriptional errors that result from this process are unintentional.

## 2016-07-02 LAB — TYPE AND SCREEN
ABO/RH(D): B POS
Antibody Screen: NEGATIVE

## 2016-07-02 LAB — RPR: RPR Ser Ql: NONREACTIVE

## 2016-07-04 ENCOUNTER — Encounter: Payer: Self-pay | Admitting: Family Medicine

## 2016-07-04 ENCOUNTER — Ambulatory Visit (INDEPENDENT_AMBULATORY_CARE_PROVIDER_SITE_OTHER): Payer: Medicare Other | Admitting: Family Medicine

## 2016-07-04 DIAGNOSIS — F324 Major depressive disorder, single episode, in partial remission: Secondary | ICD-10-CM

## 2016-07-04 DIAGNOSIS — K5900 Constipation, unspecified: Secondary | ICD-10-CM | POA: Diagnosis not present

## 2016-07-04 MED ORDER — BUPROPION HCL ER (SR) 150 MG PO TB12: 150.0000 mg | ORAL_TABLET | Freq: Two times a day (BID) | ORAL | 6 refills | Status: DC

## 2016-07-04 MED ORDER — POLYETHYLENE GLYCOL 3350 17 GM/SCOOP PO POWD: 17.0000 g | Freq: Two times a day (BID) | ORAL | 2 refills | Status: DC | PRN

## 2016-07-04 NOTE — Progress Notes (Signed)
Patient: Courtney Ross Female    DOB: [birth year]   61 y.o.   MRN: [id number] Visit Date: 07/04/2016  Today's Provider: Lelon Huh, MD   Chief Complaint  Patient presents with  . Depression    5 week follow up   Subjective:    HPI Follow up Depression:  Patient was last seen for this problem on 05/30/2016. Changes made during that visit includes adding Bupropion 100mg  twice daily. Today patient comes in reporting good compliance with treatment, good tolerance and good symptom control. States her mood has improved considerably the last couple of weeks, but still not back to her usual self.    Allergies  Allergen Reactions  . Hydrocodone Itching  . Mirtazapine     Weight gain     Current Outpatient Prescriptions:  .  aspirin EC 81 MG tablet, Take 81 mg by mouth at bedtime., Disp: , Rfl:  .  buPROPion (WELLBUTRIN SR) 100 MG 12 hr tablet, Take 1 tablet (100 mg total) by mouth 2 (two) times daily., Disp: 60 tablet, Rfl: 2 .  Cholecalciferol (VITAMIN D3) 5000 UNITS CAPS, Take 5,000 Units by mouth daily. , Disp: , Rfl:  .  Cyanocobalamin (VITAMIN B-12) 500 MCG SUBL, Place 500 mcg under the tongue daily., Disp: , Rfl:  .  cyclobenzaprine (FLEXERIL) 10 MG tablet, Take 10 mg by mouth 3 (three) times daily as needed for muscle spasms., Disp: , Rfl:  .  desvenlafaxine (PRISTIQ) 100 MG 24 hr tablet, TAKE 1 TABLET EVERY DAY, Disp: 30 tablet, Rfl: 12 .  diazepam (VALIUM) 10 MG tablet, TAKE ONE TABLET FOUR TIMES A DAY, Disp: 120 tablet, Rfl: 3 .  estradiol (ESTRACE) 1 MG tablet, TAKE 1 TABLET EVERY DAY (Patient taking differently: TAKE 1 TABLET EVERY DAY AT NIGHT), Disp: 90 tablet, Rfl: 4 .  ibuprofen (ADVIL,MOTRIN) 200 MG tablet, Take 200-400 mg by mouth daily as needed for headache or moderate pain., Disp: , Rfl:  .  LINZESS 145 MCG CAPS capsule, TAKE ONE CAPSULE DAILY, Disp: 30 capsule, Rfl: 12 .  lisinopril (PRINIVIL,ZESTRIL) 10 MG tablet, Take 1 tablet (10 mg total) by mouth  daily., Disp: 90 tablet, Rfl: 1 .  meloxicam (MOBIC) 15 MG tablet, Take 15 mg by mouth daily. , Disp: , Rfl:  .  mirabegron ER (MYRBETRIQ) 25 MG TB24 tablet, Take 1 tablet (25 mg total) by mouth daily., Disp: 30 tablet, Rfl: 6 .  Multiple Vitamin (MULTIVITAMIN WITH MINERALS) TABS tablet, Take 1 tablet by mouth daily., Disp: , Rfl:  .  mupirocin cream (BACTROBAN) 2 %, Apply 1 application topically 2 (two) times daily., Disp: 15 g, Rfl: 1 .  oxyCODONE (ROXICODONE) 15 MG immediate release tablet, Take 1-2 tablets (15-30 mg total) by mouth every 4 (four) hours as needed for pain., Disp: 180 tablet, Rfl: 0 .  pravastatin (PRAVACHOL) 40 MG tablet, TAKE 1 TABLET BY MOUTH DAILY (Patient taking differently: TAKE 1 TABLET BY MOUTH DAILY AT NIGHT), Disp: 30 tablet, Rfl: 12 .  pregabalin (LYRICA) 75 MG capsule, TAKE ONE CAPSULE THREE TIMES A DAY, Disp: 90 capsule, Rfl: 5 .  RESTASIS 0.05 % ophthalmic emulsion, Apply 1 drop to eye 2 (two) times daily., Disp: , Rfl:   Review of Systems  Constitutional: Negative for appetite change, chills, fatigue and fever.  Respiratory: Negative for chest tightness and shortness of breath.   Cardiovascular: Negative for chest pain and palpitations.  Gastrointestinal: Negative for abdominal pain, nausea and vomiting.  Neurological:  Negative for dizziness and weakness.  Psychiatric/Behavioral: Positive for dysphoric mood (improved ).    Social History  Substance Use Topics  . Smoking status: Never Smoker  . Smokeless tobacco: Never Used  . Alcohol use 0.0 oz/week     Comment: rare 1-2 drinks per year   Objective:   BP 102/70 (BP Location: Left Arm, Patient Position: Sitting, Cuff Size: Large)   Pulse (!) 110   Temp 98.7 F (37.1 C) (Oral)   Resp 18   Wt 211 lb (95.7 kg)   SpO2 96%   BMI 32.08 kg/m   Physical Exam  General appearance: alert, well developed, well nourished, cooperative and in no distress Head: Normocephalic, without obvious abnormality,  atraumatic Respiratory: Respirations even and unlabored, normal respiratory rate Extremities: No gross deformities Skin: Skin color, texture, turgor normal. No rashes seen  Psych: Appropriate mood and affect. Neurologic: Mental status: Alert, oriented to person, place, and time, thought content appropriate.     Assessment & Plan:     1. Major depressive disorder with single episode, in partial remission ([place]) Improved since addition of bupropion and tolerating well, but not back to baseline. Increase to 150 BID.  - buPROPion (WELLBUTRIN SR) 150 MG 12 hr tablet; Take 1 tablet (150 mg total) by mouth 2 (two) times daily.  Dispense: 60 tablet; Refill: 6  2. Constipation, unspecified constipation type Requests rx for generic Miralax which she has used OTC and is effective.        Lelon Huh, MD  [facility]

## 2016-07-07 ENCOUNTER — Encounter: Admission: RE | Payer: Self-pay | Source: Ambulatory Visit

## 2016-07-07 ENCOUNTER — Ambulatory Visit
Admission: RE | Admit: 2016-07-07 | Payer: Medicare Other | Source: Ambulatory Visit | Admitting: Obstetrics and Gynecology

## 2016-07-07 SURGERY — COLPORRHAPHY, POSTERIOR, FOR RECTOCELE REPAIR
Anesthesia: General

## 2016-07-08 ENCOUNTER — Other Ambulatory Visit: Payer: Self-pay | Admitting: Family Medicine

## 2016-07-08 DIAGNOSIS — M5137 Other intervertebral disc degeneration, lumbosacral region: Secondary | ICD-10-CM

## 2016-07-08 NOTE — Telephone Encounter (Signed)
Pt needs refill on her   oxyCODONE (ROXICODONE) 15 MG immediate release tablet  Thanks Con Memos

## 2016-07-08 NOTE — Telephone Encounter (Signed)
Please review. Thanks!  

## 2016-07-11 ENCOUNTER — Telehealth: Payer: Self-pay | Admitting: Family Medicine

## 2016-07-11 MED ORDER — OXYCODONE HCL 15 MG PO TABS: 15.0000 mg | ORAL_TABLET | ORAL | 0 refills | Status: DC | PRN

## 2016-07-16 ENCOUNTER — Encounter: Payer: Medicare Other | Admitting: Obstetrics and Gynecology

## 2016-07-22 DIAGNOSIS — M47812 Spondylosis without myelopathy or radiculopathy, cervical region: Secondary | ICD-10-CM | POA: Diagnosis not present

## 2016-07-22 DIAGNOSIS — M47816 Spondylosis without myelopathy or radiculopathy, lumbar region: Secondary | ICD-10-CM | POA: Diagnosis not present

## 2016-08-11 ENCOUNTER — Other Ambulatory Visit: Payer: Self-pay

## 2016-08-11 DIAGNOSIS — M5137 Other intervertebral disc degeneration, lumbosacral region: Secondary | ICD-10-CM

## 2016-08-11 MED ORDER — OXYCODONE HCL 15 MG PO TABS: 15.0000 mg | ORAL_TABLET | ORAL | 0 refills | Status: DC | PRN

## 2016-08-11 NOTE — Telephone Encounter (Signed)
Patient called and states she needs refill on Oxycodone, when ever possible. -aa

## 2016-08-20 ENCOUNTER — Encounter
Admission: RE | Admit: 2016-08-20 | Discharge: 2016-08-20 | Disposition: A | Discharge status: Home / Self Care | Payer: Medicare Other | Source: Ambulatory Visit | Attending: Obstetrics and Gynecology | Admitting: Obstetrics and Gynecology

## 2016-08-20 ENCOUNTER — Ambulatory Visit (INDEPENDENT_AMBULATORY_CARE_PROVIDER_SITE_OTHER): Payer: Medicare Other | Admitting: Obstetrics and Gynecology

## 2016-08-20 ENCOUNTER — Inpatient Hospital Stay: Admission: RE | Admit: 2016-08-20 | Payer: Medicare Other | Source: Ambulatory Visit

## 2016-08-20 ENCOUNTER — Encounter: Payer: Self-pay | Admitting: Obstetrics and Gynecology

## 2016-08-20 VITALS — BP 103/71 | HR 108 | Ht 68.0 in | Wt 209.8 lb

## 2016-08-20 DIAGNOSIS — I10 Essential (primary) hypertension: Secondary | ICD-10-CM | POA: Diagnosis not present

## 2016-08-20 DIAGNOSIS — G8929 Other chronic pain: Secondary | ICD-10-CM | POA: Insufficient documentation

## 2016-08-20 DIAGNOSIS — Z01812 Encounter for preprocedural laboratory examination: Secondary | ICD-10-CM | POA: Diagnosis not present

## 2016-08-20 DIAGNOSIS — N816 Rectocele: Secondary | ICD-10-CM | POA: Diagnosis not present

## 2016-08-20 DIAGNOSIS — R1031 Right lower quadrant pain: Secondary | ICD-10-CM | POA: Diagnosis not present

## 2016-08-20 DIAGNOSIS — N8111 Cystocele, midline: Secondary | ICD-10-CM

## 2016-08-20 DIAGNOSIS — Z01818 Encounter for other preprocedural examination: Secondary | ICD-10-CM

## 2016-08-20 DIAGNOSIS — Z90710 Acquired absence of both cervix and uterus: Secondary | ICD-10-CM

## 2016-08-20 HISTORY — DX: Constipation, unspecified: K59.00

## 2016-08-20 HISTORY — DX: Low back pain: M54.5

## 2016-08-20 HISTORY — DX: Low back pain, unspecified: M54.50

## 2016-08-20 LAB — CBC WITH DIFFERENTIAL/PLATELET
Basophils Absolute: 0 10*3/uL (ref 0–0.1)
Basophils Relative: 1 %
Eosinophils Absolute: 0.1 10*3/uL (ref 0–0.7)
Eosinophils Relative: 2 %
HCT: 38.8 % (ref 35.0–47.0)
Hemoglobin: 13.1 g/dL (ref 12.0–16.0)
Lymphocytes Relative: 30 %
Lymphs Abs: 1.9 10*3/uL (ref 1.0–3.6)
MCH: 25.8 pg — ABNORMAL LOW (ref 26.0–34.0)
MCHC: 33.8 g/dL (ref 32.0–36.0)
MCV: 76.2 fL — ABNORMAL LOW (ref 80.0–100.0)
Monocytes Absolute: 0.5 10*3/uL (ref 0.2–0.9)
Monocytes Relative: 8 %
Neutro Abs: 3.6 10*3/uL (ref 1.4–6.5)
Neutrophils Relative %: 59 %
Platelets: 331 10*3/uL (ref 150–440)
RBC: 5.09 MIL/uL (ref 3.80–5.20)
RDW: 14.6 % — ABNORMAL HIGH (ref 11.5–14.5)
WBC: 6.2 10*3/uL (ref 3.6–11.0)

## 2016-08-20 LAB — TYPE AND SCREEN
ABO/RH(D): B POS
Antibody Screen: NEGATIVE

## 2016-08-20 NOTE — Patient Instructions (Signed)
1. Return in 1 week after surgery for postop check 

## 2016-08-20 NOTE — Patient Instructions (Signed)
  Your procedure is scheduled on: 08/25/16 Mon Report to Same Day Surgery 2nd floor medical mall [facility] Entrance-take elevator on left to 2nd floor.  Check in with surgery information desk.) To find out your arrival time please call [phone] between 1PM - 3PM on 08/22/16 Fri  Remember: Instructions that are not followed completely may result in serious medical risk, up to and including death, or upon the discretion of your surgeon and anesthesiologist your surgery may need to be rescheduled.    _x___ 1. Do not eat food or drink liquids after midnight. No gum chewing or hard candies.     __x__ 2. No Alcohol for 24 hours before or after surgery.   __x__3. No Smoking for 24 prior to surgery.   ____  4. Bring all medications with you on the day of surgery if instructed.    __x__ 5. Notify your doctor if there is any change in your medical condition     (cold, fever, infections).     Do not wear jewelry, make-up, hairpins, clips or nail polish.  Do not wear lotions, powders, or perfumes. You may wear deodorant.  Do not shave 48 hours prior to surgery. Men may shave face and neck.  Do not bring valuables to the hospital.    [facility] is not responsible for any belongings or valuables.               Contacts, dentures or bridgework may not be worn into surgery.  Leave your suitcase in the car. After surgery it may be brought to your room.  For patients admitted to the hospital, discharge time is determined by your treatment team.   Patients discharged the day of surgery will not be allowed to drive home.  You will need someone to drive you home and stay with you the night of your procedure.    Please read over the following fact sheets that you were given:   [facility] Preparing for Surgery and or MRSA Information   _x___ Take these medicines the morning of surgery with A SIP OF WATER:    1. buPROPion (WELLBUTRIN   2.desvenlafaxine (PRISTIQ)  3.lisinopril  (PRINIVIL  4.pregabalin (LYRICA)   5.  6.  ____Fleets enema or Magnesium Citrate as directed.   _x___ Use CHG Soap or sage wipes as directed on instruction sheet   ____ Use inhalers on the day of surgery and bring to hospital day of surgery  ____ Stop metformin 2 days prior to surgery    ____ Take 1/2 of usual insulin dose the night before surgery and none on the morning of           surgery.   _x___ Stop Aspirin, Coumadin, Pllavix ,Eliquis, Effient, or Pradaxa  Stopped aspirin already.  x__ Stop Anti-inflammatories such as Advil, Aleve, Ibuprofen, Motrin, Naproxen,          Naprosyn, Goodies powders or aspirin products. Ok to take Tylenol.   Stop Mobic today   ____ Stop supplements until after surgery.    ____ Bring C-Pap to the hospital.

## 2016-08-20 NOTE — H&P (Signed)
[] Hide copied text Subjective:  PREOPERATIVE HISTORY AND PHYSICAL   Date of surgery: 08/25/2016 (rescheduled from 07/07/2016) Procedure: Posterior colporrhaphy with enterocele ligation, possible anterior colporrhaphy Diagnosis: 1. Symptomatic rectocele 2. Enterocele 3. Cystocele   Patient is a 60 y.o. G7P4027female scheduled for posterior colporrhaphy with enterocele ligation on 07/08/2011. Patient also has a cystocele and may require anterior colporrhaphy as well. Patient is symptomatic with rectocele with need for splinting despite use of stool softeners and increased fiber in diet. Patient is not currently sexually active.  Gynecologic History No LMP recorded. Patient has had a hysterectomy. Contraception: status post hysterectomy Last Pap: 2016. Results were: normal Last mammogram: 2017. Results were: normal          OB History    Gravida Para Term Preterm AB Living   7 4 4   3 4    SAB TAB Ectopic Multiple Live Births   2   1   4       No LMP recorded. Patient has had a hysterectomy.        Past Medical History:  Diagnosis Date  . Anxiety   . Back pain   . Degenerative disc disease, lumbar   . Depression   . Fatty liver   . Headache    migraines  . Headache, migraine 05/08/2015  . High cholesterol   . Polycystic ovarian disease   . PONV (postoperative nausea and vomiting)   . Restless leg syndrome   . Spasm 01/29/2009         Past Surgical History:  Procedure Laterality Date  . [place]  . [place]ENDECTOMY  1988  . ARTHRODESIS METATARSALPHALANGEAL JOINT (MTPJ) Left 12/14/2014   Procedure: ARTHRODESIS METATARSALPHALANGEAL JOINT (MTPJ) 1ST;  Surgeon: Albertine Patricia, DPM;  Location: [place];  Service: Podiatry;  Laterality: Left;  LMA  . BACK SURGERY  [phone]   x3  . BLADDER SUSPENSION  2006  . BREAST BIOPSY Left   . BREAST SURGERY  2005   lumpectomy  . CARPAL TUNNEL RELEASE Bilateral  02/26/07,05/21/07  . COLPORRHAPHY    . DILATION AND CURETTAGE OF UTERUS    . [place]  . FOOT SURGERY    . HAMMER TOE SURGERY Left 03/22/2015   Procedure: HAMMER TOE CORRECTION L 2ND AND 3RD ;  Surgeon: Albertine Patricia, DPM;  Location: [place];  Service: Podiatry;  Laterality: Left;  WITH LOCAL  . HERNIA REPAIR  1988  . NECK SURGERY  2006   fusion  . SPINAL CORD STIMULATOR IMPLANT    . TONSILLECTOMY  1985                    OB History  Gravida Para Term Preterm AB Living  7 4 4   3 4   SAB TAB Ectopic Multiple Live Births  2   1   4     # Outcome Date GA Lbr Len/2nd Weight Sex Delivery Anes PTL Lv  7 Term 1984   6 lb 6.4 oz (2.903 kg) F Vag-Spont   LIV  6 Term 1980   8 lb 4.8 oz (3.765 kg) M Vag-Spont   LIV  5 Term 1977   7 lb (3.175 kg) M Vag-Spont   LIV  4 Term 1972   7 lb 9.6 oz (3.447 kg) M Vag-Spont   LIV  3 Ectopic           2 SAB  1 SAB               Social History        Social History  . Marital status: Married    Spouse name: N/A  . Number of children: N/A  . Years of education: N/A         Social History Main Topics  . Smoking status: Never Smoker  . Smokeless tobacco: Never Used  . Alcohol use 0.0 oz/week     Comment: rare  . Drug use: No  . Sexual activity: Yes    Birth control/ protection: Surgical       Other Topics Concern  . None      Social History Narrative  . None          Family History  Problem Relation Age of Onset  . Diabetes Father   . Hypertension Father   . CAD Father   . Diabetes Sister     non-Insulin dependent diabetes mellitus  . Heart disease Brother   . Cancer Daughter     cancer of the cervix at age 43-18  . Heart disease Brother   . Breast cancer Paternal Aunt   . Colon cancer Paternal Uncle   . Ovarian cancer Neg Hx      (Not in a hospital admission)       Allergies    Allergen Reactions  . Hydrocodone Itching  . Mirtazapine     Weight gain    Review of Systems (Updated 08/12/2016) Constitutional: No recent fever/chills/sweats Respiratory: No recent cough/bronchitis Cardiovascular: No chest pain Gastrointestinal: No recent nausea/vomiting/diarrhea Genitourinary: No UTI symptoms Hematologic/lymphatic:No history of coagulopathy or recent blood thinner use    Objective:  Updated 2/28/ 2018 (no physical exam changes other than vital signs) BP 103/71   Pulse (!) 108   Ht 5\' 8"  (1.727 m)   Wt 209 lb 12.8 oz (95.2 kg)   BMI 31.90 kg/m    General:   Normal  Skin:   normal  HEENT:  Normal  Neck:  Supple without Adenopathy or Thyromegaly  Lungs:   Heart:              Breasts:   Abdomen:  Pelvis:  M/S   Extremeties:  Neuro:    clear to auscultation bilaterally   Normal without murmur   Not Examined   soft, non-tender; bowel sounds normal; no masses,  no organomegaly   Exam deferred to OR  No CVAT  Warm/Dry   Normal       05/29/2016 PELVIC: External Genitalia: Normal BUS: Normal Vagina: Second degree cystocele; good support at the urethrovesical junction; largerectocele;questionableenterocele; fair estrogen effectof vaginal mucosa Cervix: Surgically absent Uterus: Surgically absent Adnexa: Normal; nonpalpable nontender RV: External Exam NormaI, No Rectal Masses and Normal Sphincter tone  Assessment:    1. Symptomatic rectocele 2. Probable enterocele 3. Second degree cystocele with good support at the urethrovesical junction, asymptomatic   Plan:   Posterior colporrhaphy with enterocele ligation, possible anterior colporrhaphy  Preoperative counseling: (Updated 08/20/2016) Patient is to undergo posterior colporrhaphy with enterocele ligation and possible anterior colporrhaphy on 08/25/2016. She is understanding of the planned  procedure and is aware of and is accepting of all surgical risks which include but are not limited to bleeding, infection, pelvic organ injury with need for repair, blood clot disorders, anesthesia risks, etc. All questions have been answered. Informed consent is given. Patient is ready and willing to proceed with surgery as scheduled. While patient is  not currently sexually active, we will maintain vaginal caliber in order to facilitate sexual intercourse in the future   Brayton Mars, MD  (08/20/2016) UPDATED  Note: This dictation was prepared with Dragon dictation along with smaller phrase technology. Any transcriptional errors that result from this process are unintentional.      Electronically signed by Brayton Mars, MD at 07/01/2016 9:24 AM      Office Visit on 07/01/2016        Routing History        Detailed Report

## 2016-08-20 NOTE — Progress Notes (Signed)
H&P Encounter Date: 07/01/2016 8:45 AM  UPDATED 2/ 28 2018 Courtney Mars, MD  Obstetrics/Gynecology    [] Hide copied text Subjective:  PREOPERATIVE HISTORY AND PHYSICAL   Date of surgery: 08/25/2016 (rescheduled from 07/07/2016) Procedure: Posterior colporrhaphy with enterocele ligation, possible anterior colporrhaphy Diagnosis: 1. Symptomatic rectocele 2. Enterocele 3. Cystocele   Patient is a 61 y.o. G7P4079female scheduled for posterior colporrhaphy with enterocele ligation on 07/08/2011. Patient also has a cystocele and may require anterior colporrhaphy as well. Patient is symptomatic with rectocele with need for splinting despite use of stool softeners and increased fiber in diet. Patient is not currently sexually active.  Gynecologic History No LMP recorded. Patient has had a hysterectomy. Contraception: status post hysterectomy Last Pap: 2016. Results were: normal Last mammogram: 2017. Results were: normal          OB History    Gravida Para Term Preterm AB Living   7 4 4   3 4    SAB TAB Ectopic Multiple Live Births   2   1   4       No LMP recorded. Patient has had a hysterectomy.        Past Medical History:  Diagnosis Date  . Anxiety   . Back pain   . Degenerative disc disease, lumbar   . Depression   . Fatty liver   . Headache    migraines  . Headache, migraine 05/08/2015  . High cholesterol   . Polycystic ovarian disease   . PONV (postoperative nausea and vomiting)   . Restless leg syndrome   . Spasm 01/29/2009         Past Surgical History:  Procedure Laterality Date  . [place]      [place]  . APPENDECTOMY  1988  . ARTHRODESIS METATARSALPHALANGEAL JOINT (MTPJ) Left 12/14/2014   Procedure: ARTHRODESIS METATARSALPHALANGEAL JOINT (MTPJ) 1ST;  Surgeon: Albertine Patricia, DPM;  Location: [facility];  Service: Podiatry;  Laterality: Left;  LMA  . BACK SURGERY  [phone]   x3  . BLADDER SUSPENSION   2006  . BREAST BIOPSY Left   . BREAST SURGERY  2005   lumpectomy  . CARPAL TUNNEL RELEASE Bilateral 02/26/07,05/21/07  . COLPORRHAPHY    . DILATION AND CURETTAGE OF UTERUS    . [place]  . FOOT SURGERY    . HAMMER TOE SURGERY Left 03/22/2015   Procedure: HAMMER TOE CORRECTION L 2ND AND 3RD ;  Surgeon: Albertine Patricia, DPM;  Location: [facility];  Service: Podiatry;  Laterality: Left;  WITH LOCAL  . HERNIA REPAIR  1988  . NECK SURGERY  2006   fusion  . SPINAL CORD STIMULATOR IMPLANT    . TONSILLECTOMY  1985                    OB History  Gravida Para Term Preterm AB Living  7 4 4   3 4   SAB TAB Ectopic Multiple Live Births  2   1   4     # Outcome Date GA Lbr Len/2nd Weight Sex Delivery Anes PTL Lv  7 Term 1984   6 lb 6.4 oz (2.903 kg) F Vag-Spont   LIV  6 Term 1980   8 lb 4.8 oz (3.765 kg) M Vag-Spont   LIV  5 Term 1977   7 lb (3.175 kg) M Vag-Spont   LIV  4 Term 1972   7 lb 9.6 oz (3.447 kg) M Vag-Spont   LIV  3 Ectopic           2 SAB           1 SAB               Social History        Social History  . Marital status: Married    Spouse name: N/A  . Number of children: N/A  . Years of education: N/A         Social History Main Topics  . Smoking status: Never Smoker  . Smokeless tobacco: Never Used  . Alcohol use 0.0 oz/week     Comment: rare  . Drug use: No  . Sexual activity: Yes    Birth control/ protection: Surgical       Other Topics Concern  . None      Social History Narrative  . None          Family History  Problem Relation Age of Onset  . Diabetes Father   . Hypertension Father   . CAD Father   . Diabetes Sister     non-Insulin dependent diabetes mellitus  . Heart disease Brother   . Cancer Daughter     cancer of the cervix at age 42-18  . Heart disease Brother   . Breast cancer Paternal Aunt   . Colon cancer  Paternal Uncle   . Ovarian cancer Neg Hx      (Not in a hospital admission)       Allergies  Allergen Reactions  . Hydrocodone Itching  . Mirtazapine     Weight gain    Review of Systems Constitutional: No recent fever/chills/sweats Respiratory: No recent cough/bronchitis Cardiovascular: No chest pain Gastrointestinal: No recent nausea/vomiting/diarrhea Genitourinary: No UTI symptoms Hematologic/lymphatic:No history of coagulopathy or recent blood thinner use    Objective:  Updated 2/28/ 2018 (no physical exam changes other than vital signs) BP 103/71   Pulse (!) 108   Ht 5\' 8"  (1.727 m)   Wt 209 lb 12.8 oz (95.2 kg)   BMI 31.90 kg/m    General:   Normal  Skin:   normal  HEENT:  Normal  Neck:  Supple without Adenopathy or Thyromegaly  Lungs:   Heart:              Breasts:   Abdomen:  Pelvis:  M/S   Extremeties:  Neuro:    clear to auscultation bilaterally   Normal without murmur   Not Examined   soft, non-tender; bowel sounds normal; no masses,  no organomegaly   Exam deferred to OR  No CVAT  Warm/Dry   Normal       05/29/2016 PELVIC: External Genitalia: Normal BUS: Normal Vagina: Second degree cystocele; good support at the urethrovesical junction; largerectocele;questionableenterocele; fair estrogen effectof vaginal mucosa Cervix: Surgically absent Uterus: Surgically absent Adnexa: Normal; nonpalpable nontender RV: External Exam NormaI, No Rectal Masses and Normal Sphincter tone  Assessment:    1. Symptomatic rectocele 2. Probable enterocele 3. Second degree cystocele with good support at the urethrovesical junction, asymptomatic   Plan:   Posterior colporrhaphy with enterocele ligation, possible anterior colporrhaphy  Preoperative counseling: (Updated 08/20/2016) Patient is to undergo posterior colporrhaphy with enterocele ligation  and possible anterior colporrhaphy on 08/25/2016. She is understanding of the planned procedure and is aware of and is accepting of all surgical risks which include but are not limited to bleeding, infection, pelvic organ injury with need for repair, blood clot disorders, anesthesia risks, etc. All questions  have been answered. Informed consent is given. Patient is ready and willing to proceed with surgery as scheduled. While patient is not currently sexually active, we will maintain vaginal caliber in order to facilitate sexual intercourse in the future   Courtney Mars, MD  (08/20/2016)  Note: This dictation was prepared with Dragon dictation along with smaller phrase technology. Any transcriptional errors that result from this process are unintentional.      Electronically signed by Courtney Mars, MD at 07/01/2016 9:24 AM      Office Visit on 07/01/2016        Routing History        Detailed Report

## 2016-08-22 MED ORDER — LACTATED RINGERS IV SOLN: INTRAVENOUS | Status: DC

## 2016-08-25 ENCOUNTER — Encounter: Payer: Self-pay | Admitting: *Deleted

## 2016-08-25 ENCOUNTER — Observation Stay
Admission: RE | Admit: 2016-08-25 | Discharge: 2016-08-26 | Disposition: A | Discharge status: Home / Self Care | Payer: Medicare Other | Source: Ambulatory Visit | Attending: Obstetrics and Gynecology | Admitting: Obstetrics and Gynecology

## 2016-08-25 ENCOUNTER — Ambulatory Visit: Payer: Medicare Other | Admitting: Anesthesiology

## 2016-08-25 ENCOUNTER — Encounter
Admission: RE | Disposition: A | Discharge status: Home / Self Care | Payer: Self-pay | Source: Ambulatory Visit | Attending: Obstetrics and Gynecology

## 2016-08-25 DIAGNOSIS — Z885 Allergy status to narcotic agent status: Secondary | ICD-10-CM | POA: Diagnosis not present

## 2016-08-25 DIAGNOSIS — K760 Fatty (change of) liver, not elsewhere classified: Secondary | ICD-10-CM | POA: Diagnosis not present

## 2016-08-25 DIAGNOSIS — R252 Cramp and spasm: Secondary | ICD-10-CM | POA: Insufficient documentation

## 2016-08-25 DIAGNOSIS — M5136 Other intervertebral disc degeneration, lumbar region: Secondary | ICD-10-CM | POA: Diagnosis not present

## 2016-08-25 DIAGNOSIS — M549 Dorsalgia, unspecified: Secondary | ICD-10-CM | POA: Insufficient documentation

## 2016-08-25 DIAGNOSIS — G2581 Restless legs syndrome: Secondary | ICD-10-CM | POA: Insufficient documentation

## 2016-08-25 DIAGNOSIS — Z8049 Family history of malignant neoplasm of other genital organs: Secondary | ICD-10-CM | POA: Diagnosis not present

## 2016-08-25 DIAGNOSIS — E7800 Pure hypercholesterolemia, unspecified: Secondary | ICD-10-CM | POA: Insufficient documentation

## 2016-08-25 DIAGNOSIS — Z888 Allergy status to other drugs, medicaments and biological substances status: Secondary | ICD-10-CM | POA: Insufficient documentation

## 2016-08-25 DIAGNOSIS — Z800 Family history of malignant neoplasm of digestive organs: Secondary | ICD-10-CM | POA: Insufficient documentation

## 2016-08-25 DIAGNOSIS — N8110 Cystocele, unspecified: Secondary | ICD-10-CM | POA: Diagnosis not present

## 2016-08-25 DIAGNOSIS — F329 Major depressive disorder, single episode, unspecified: Secondary | ICD-10-CM | POA: Diagnosis not present

## 2016-08-25 DIAGNOSIS — Z90710 Acquired absence of both cervix and uterus: Secondary | ICD-10-CM | POA: Insufficient documentation

## 2016-08-25 DIAGNOSIS — N8111 Cystocele, midline: Secondary | ICD-10-CM | POA: Insufficient documentation

## 2016-08-25 DIAGNOSIS — Z8249 Family history of ischemic heart disease and other diseases of the circulatory system: Secondary | ICD-10-CM | POA: Diagnosis not present

## 2016-08-25 DIAGNOSIS — N815 Vaginal enterocele: Secondary | ICD-10-CM | POA: Diagnosis not present

## 2016-08-25 DIAGNOSIS — Z833 Family history of diabetes mellitus: Secondary | ICD-10-CM | POA: Insufficient documentation

## 2016-08-25 DIAGNOSIS — G43909 Migraine, unspecified, not intractable, without status migrainosus: Secondary | ICD-10-CM | POA: Insufficient documentation

## 2016-08-25 DIAGNOSIS — N3281 Overactive bladder: Secondary | ICD-10-CM | POA: Diagnosis not present

## 2016-08-25 DIAGNOSIS — Z9889 Other specified postprocedural states: Secondary | ICD-10-CM | POA: Insufficient documentation

## 2016-08-25 DIAGNOSIS — M1990 Unspecified osteoarthritis, unspecified site: Secondary | ICD-10-CM | POA: Insufficient documentation

## 2016-08-25 DIAGNOSIS — Z87891 Personal history of nicotine dependence: Secondary | ICD-10-CM | POA: Insufficient documentation

## 2016-08-25 DIAGNOSIS — N816 Rectocele: Principal | ICD-10-CM | POA: Diagnosis present

## 2016-08-25 DIAGNOSIS — Z803 Family history of malignant neoplasm of breast: Secondary | ICD-10-CM | POA: Diagnosis not present

## 2016-08-25 DIAGNOSIS — Z981 Arthrodesis status: Secondary | ICD-10-CM | POA: Diagnosis not present

## 2016-08-25 DIAGNOSIS — E282 Polycystic ovarian syndrome: Secondary | ICD-10-CM | POA: Insufficient documentation

## 2016-08-25 DIAGNOSIS — F419 Anxiety disorder, unspecified: Secondary | ICD-10-CM | POA: Diagnosis not present

## 2016-08-25 DIAGNOSIS — K219 Gastro-esophageal reflux disease without esophagitis: Secondary | ICD-10-CM | POA: Insufficient documentation

## 2016-08-25 HISTORY — PX: RECTOCELE REPAIR: SHX761

## 2016-08-25 SURGERY — COLPORRHAPHY, POSTERIOR, FOR RECTOCELE REPAIR
Anesthesia: General

## 2016-08-25 MED ORDER — ESTROGENS, CONJUGATED 0.625 MG/GM VA CREA
TOPICAL_CREAM | VAGINAL | Status: DC | PRN
  Administered 2016-08-25: 1 via VAGINAL

## 2016-08-25 MED ORDER — FENTANYL CITRATE (PF) 100 MCG/2ML IJ SOLN
INTRAMUSCULAR | Status: AC
  Administered 2016-08-25: 25 ug via INTRAVENOUS
  Filled 2016-08-25: qty 2

## 2016-08-25 MED ORDER — PHENYLEPHRINE HCL 10 MG/ML IJ SOLN
INTRAMUSCULAR | Status: AC
  Filled 2016-08-25: qty 1

## 2016-08-25 MED ORDER — PHENYLEPHRINE HCL 10 MG/ML IJ SOLN
INTRAMUSCULAR | Status: DC | PRN
  Administered 2016-08-25 (×3): 100 ug via INTRAVENOUS

## 2016-08-25 MED ORDER — FAMOTIDINE 20 MG PO TABS
ORAL_TABLET | ORAL | Status: AC
  Administered 2016-08-25: 20 mg via ORAL
  Filled 2016-08-25: qty 1

## 2016-08-25 MED ORDER — ESTROGENS, CONJUGATED 0.625 MG/GM VA CREA
TOPICAL_CREAM | VAGINAL | Status: AC
  Filled 2016-08-25: qty 30

## 2016-08-25 MED ORDER — LACTATED RINGERS IV SOLN
INTRAVENOUS | Status: DC
  Administered 2016-08-25 – 2016-08-26 (×2): via INTRAVENOUS

## 2016-08-25 MED ORDER — KETOROLAC TROMETHAMINE 30 MG/ML IJ SOLN: 30.0000 mg | Freq: Four times a day (QID) | INTRAMUSCULAR | Status: DC

## 2016-08-25 MED ORDER — MIDAZOLAM HCL 2 MG/2ML IJ SOLN
INTRAMUSCULAR | Status: AC
  Filled 2016-08-25: qty 2

## 2016-08-25 MED ORDER — DOCUSATE SODIUM 100 MG PO CAPS
100.0000 mg | ORAL_CAPSULE | Freq: Two times a day (BID) | ORAL | Status: DC
  Administered 2016-08-25 – 2016-08-26 (×3): 100 mg via ORAL
  Filled 2016-08-25 (×3): qty 1

## 2016-08-25 MED ORDER — DEXAMETHASONE SODIUM PHOSPHATE 10 MG/ML IJ SOLN
INTRAMUSCULAR | Status: DC | PRN
  Administered 2016-08-25: 10 mg via INTRAVENOUS

## 2016-08-25 MED ORDER — ACETAMINOPHEN NICU IV SYRINGE 10 MG/ML
INTRAVENOUS | Status: AC
  Filled 2016-08-25: qty 1

## 2016-08-25 MED ORDER — ONDANSETRON HCL 4 MG/2ML IJ SOLN
INTRAMUSCULAR | Status: DC | PRN
  Administered 2016-08-25: 4 mg via INTRAVENOUS

## 2016-08-25 MED ORDER — SUCCINYLCHOLINE CHLORIDE 20 MG/ML IJ SOLN
INTRAMUSCULAR | Status: AC
  Filled 2016-08-25: qty 1

## 2016-08-25 MED ORDER — LIDOCAINE HCL (PF) 2 % IJ SOLN
INTRAMUSCULAR | Status: AC
  Filled 2016-08-25: qty 2

## 2016-08-25 MED ORDER — FENTANYL CITRATE (PF) 100 MCG/2ML IJ SOLN
INTRAMUSCULAR | Status: AC
  Filled 2016-08-25: qty 2

## 2016-08-25 MED ORDER — KETOROLAC TROMETHAMINE 30 MG/ML IJ SOLN
30.0000 mg | Freq: Four times a day (QID) | INTRAMUSCULAR | Status: DC
  Administered 2016-08-25 – 2016-08-26 (×5): 30 mg via INTRAVENOUS
  Filled 2016-08-25 (×5): qty 1

## 2016-08-25 MED ORDER — MORPHINE SULFATE (PF) 4 MG/ML IV SOLN: 4.0000 mg | INTRAVENOUS | Status: DC | PRN

## 2016-08-25 MED ORDER — ONDANSETRON HCL 4 MG/2ML IJ SOLN
INTRAMUSCULAR | Status: AC
  Filled 2016-08-25: qty 2

## 2016-08-25 MED ORDER — SUCCINYLCHOLINE CHLORIDE 20 MG/ML IJ SOLN
INTRAMUSCULAR | Status: DC | PRN
  Administered 2016-08-25: 100 mg via INTRAVENOUS

## 2016-08-25 MED ORDER — FENTANYL CITRATE (PF) 100 MCG/2ML IJ SOLN
25.0000 ug | INTRAMUSCULAR | Status: DC | PRN
  Administered 2016-08-25 (×4): 25 ug via INTRAVENOUS

## 2016-08-25 MED ORDER — SCOPOLAMINE 1 MG/3DAYS TD PT72
1.0000 | MEDICATED_PATCH | TRANSDERMAL | Status: DC
  Administered 2016-08-25: 1.5 mg via TRANSDERMAL

## 2016-08-25 MED ORDER — PROPOFOL 10 MG/ML IV BOLUS
INTRAVENOUS | Status: DC | PRN
  Administered 2016-08-25: 50 mg via INTRAVENOUS
  Administered 2016-08-25: 150 mg via INTRAVENOUS

## 2016-08-25 MED ORDER — LACTATED RINGERS IV SOLN
INTRAVENOUS | Status: DC
  Administered 2016-08-25: 07:00:00 via INTRAVENOUS

## 2016-08-25 MED ORDER — FAMOTIDINE 20 MG PO TABS
20.0000 mg | ORAL_TABLET | Freq: Once | ORAL | Status: AC
  Administered 2016-08-25: 20 mg via ORAL

## 2016-08-25 MED ORDER — LIDOCAINE HCL (CARDIAC) 20 MG/ML IV SOLN
INTRAVENOUS | Status: DC | PRN
  Administered 2016-08-25: 100 mg via INTRAVENOUS

## 2016-08-25 MED ORDER — MIDAZOLAM HCL 2 MG/2ML IJ SOLN
INTRAMUSCULAR | Status: DC | PRN
  Administered 2016-08-25: 2 mg via INTRAVENOUS

## 2016-08-25 MED ORDER — FENTANYL CITRATE (PF) 100 MCG/2ML IJ SOLN
INTRAMUSCULAR | Status: DC | PRN
  Administered 2016-08-25 (×2): 50 ug via INTRAVENOUS

## 2016-08-25 MED ORDER — DEXAMETHASONE SODIUM PHOSPHATE 10 MG/ML IJ SOLN
INTRAMUSCULAR | Status: AC
  Filled 2016-08-25: qty 1

## 2016-08-25 MED ORDER — PROPOFOL 10 MG/ML IV BOLUS
INTRAVENOUS | Status: AC
  Filled 2016-08-25: qty 20

## 2016-08-25 MED ORDER — OXYCODONE HCL 5 MG PO TABS
15.0000 mg | ORAL_TABLET | Freq: Four times a day (QID) | ORAL | Status: DC | PRN
  Administered 2016-08-25 – 2016-08-26 (×3): 15 mg via ORAL
  Filled 2016-08-25 (×3): qty 3

## 2016-08-25 MED ORDER — SCOPOLAMINE 1 MG/3DAYS TD PT72
MEDICATED_PATCH | TRANSDERMAL | Status: AC
  Administered 2016-08-25: 1.5 mg via TRANSDERMAL
  Filled 2016-08-25: qty 1

## 2016-08-25 MED ORDER — ACETAMINOPHEN 10 MG/ML IV SOLN
INTRAVENOUS | Status: DC | PRN
  Administered 2016-08-25: 1000 mg via INTRAVENOUS

## 2016-08-25 MED ORDER — ACETAMINOPHEN 325 MG PO TABS: 650.0000 mg | ORAL_TABLET | ORAL | Status: DC | PRN

## 2016-08-25 MED ORDER — MORPHINE SULFATE (PF) 2 MG/ML IV SOLN
2.0000 mg | INTRAVENOUS | Status: DC | PRN
  Administered 2016-08-25: 2 mg via INTRAVENOUS
  Filled 2016-08-25: qty 1

## 2016-08-25 MED ORDER — ONDANSETRON HCL 4 MG/2ML IJ SOLN
4.0000 mg | Freq: Once | INTRAMUSCULAR | Status: DC | PRN
Start: 2016-08-25 — End: 2016-08-25

## 2016-08-25 SURGICAL SUPPLY — 31 items
BAG URO DRAIN 2000ML W/SPOUT (MISCELLANEOUS) ×2 IMPLANT
BLADE PHOTON ILLUMINATED (MISCELLANEOUS) IMPLANT
CANISTER SUCT 1200ML W/VALVE (MISCELLANEOUS) ×2 IMPLANT
CATH FOLEY 2WAY  5CC 16FR (CATHETERS) ×1
CATH FOLEY 2WAY 5CC 16FR (CATHETERS) ×1
CATH URTH 16FR FL 2W BLN LF (CATHETERS) ×1 IMPLANT
CORD BIP STRL DISP 12FT (MISCELLANEOUS) ×1 IMPLANT
DRAPE PERI LITHO V/GYN (MISCELLANEOUS) ×2 IMPLANT
DRAPE SHEET LG 3/4 BI-LAMINATE (DRAPES) ×2 IMPLANT
DRAPE UNDER BUTTOCK W/FLU (DRAPES) ×2 IMPLANT
ELECT REM PT RETURN 9FT ADLT (ELECTROSURGICAL)
ELECTRODE REM PT RTRN 9FT ADLT (ELECTROSURGICAL) ×1 IMPLANT
FORCEPS JEWEL BIP 4-3/4 STR (INSTRUMENTS) ×1 IMPLANT
GAUZE PACK 2X3YD (MISCELLANEOUS) ×2 IMPLANT
GLOVE BIO SURGEON STRL SZ8 (GLOVE) ×6 IMPLANT
GLOVE INDICATOR 8.0 STRL GRN (GLOVE) ×6 IMPLANT
GOWN STRL REUS W/ TWL LRG LVL3 (GOWN DISPOSABLE) ×2 IMPLANT
GOWN STRL REUS W/ TWL XL LVL3 (GOWN DISPOSABLE) ×2 IMPLANT
GOWN STRL REUS W/TWL LRG LVL3 (GOWN DISPOSABLE) ×4
GOWN STRL REUS W/TWL XL LVL3 (GOWN DISPOSABLE) ×4
KIT RM TURNOVER CYSTO AR (KITS) ×2 IMPLANT
LABEL OR SOLS (LABEL) ×2 IMPLANT
NS IRRIG 500ML POUR BTL (IV SOLUTION) ×2 IMPLANT
PACK BASIN MINOR ARMC (MISCELLANEOUS) ×2 IMPLANT
PAD PREP 24X41 OB/GYN DISP (PERSONAL CARE ITEMS) ×2 IMPLANT
SPONGE XRAY 4X4 16PLY STRL (MISCELLANEOUS) ×1 IMPLANT
SUT CHROMIC 2 0 CT 1 (SUTURE) ×11 IMPLANT
SUT VIC AB 0 CT1 36 (SUTURE) ×5 IMPLANT
SUT VIC AB 0 CT2 27 (SUTURE) ×3 IMPLANT
SUT VIC AB 2-0 UR6 27 (SUTURE) ×4 IMPLANT
SYRINGE 10CC LL (SYRINGE) ×2 IMPLANT

## 2016-08-25 NOTE — H&P (View-Only) (Signed)
[] Hide copied text Subjective:  PREOPERATIVE HISTORY AND PHYSICAL   Date of surgery: 08/25/2016 (rescheduled from 07/07/2016) Procedure: Posterior colporrhaphy with enterocele ligation, possible anterior colporrhaphy Diagnosis: 1. Symptomatic rectocele 2. Enterocele 3. Cystocele   Patient is a 61 y.o. G7P4016female scheduled for posterior colporrhaphy with enterocele ligation on 07/08/2011. Patient also has a cystocele and may require anterior colporrhaphy as well. Patient is symptomatic with rectocele with need for splinting despite use of stool softeners and increased fiber in diet. Patient is not currently sexually active.  Gynecologic History No LMP recorded. Patient has had a hysterectomy. Contraception: status post hysterectomy Last Pap: 2016. Results were: normal Last mammogram: 2017. Results were: normal          OB History    Gravida Para Term Preterm AB Living   7 4 4   3 4    SAB TAB Ectopic Multiple Live Births   2   1   4       No LMP recorded. Patient has had a hysterectomy.        Past Medical History:  Diagnosis Date  . Anxiety   . Back pain   . Degenerative disc disease, lumbar   . Depression   . Fatty liver   . Headache    migraines  . Headache, migraine 05/08/2015  . High cholesterol   . Polycystic ovarian disease   . PONV (postoperative nausea and vomiting)   . Restless leg syndrome   . Spasm 01/29/2009         Past Surgical History:  Procedure Laterality Date  . [place]      [place]  . APPENDECTOMY  1988  . ARTHRODESIS METATARSALPHALANGEAL JOINT (MTPJ) Left 12/14/2014   Procedure: ARTHRODESIS METATARSALPHALANGEAL JOINT (MTPJ) 1ST;  Surgeon: Albertine Patricia, DPM;  Location: [facility];  Service: Podiatry;  Laterality: Left;  LMA  . BACK SURGERY  [phone]   x3  . BLADDER SUSPENSION  2006  . BREAST BIOPSY Left   . BREAST SURGERY  2005   lumpectomy  . CARPAL TUNNEL RELEASE Bilateral  02/26/07,05/21/07  . COLPORRHAPHY    . DILATION AND CURETTAGE OF UTERUS    . [place]  . FOOT SURGERY    . HAMMER TOE SURGERY Left 03/22/2015   Procedure: HAMMER TOE CORRECTION L 2ND AND 3RD ;  Surgeon: Albertine Patricia, DPM;  Location: [facility];  Service: Podiatry;  Laterality: Left;  WITH LOCAL  . HERNIA REPAIR  1988  . NECK SURGERY  2006   fusion  . SPINAL CORD STIMULATOR IMPLANT    . TONSILLECTOMY  1985                    OB History  Gravida Para Term Preterm AB Living  7 4 4   3 4   SAB TAB Ectopic Multiple Live Births  2   1   4     # Outcome Date GA Lbr Len/2nd Weight Sex Delivery Anes PTL Lv  7 Term 1984   6 lb 6.4 oz (2.903 kg) F Vag-Spont   LIV  6 Term 1980   8 lb 4.8 oz (3.765 kg) M Vag-Spont   LIV  5 Term 1977   7 lb (3.175 kg) M Vag-Spont   LIV  4 Term 1972   7 lb 9.6 oz (3.447 kg) M Vag-Spont   LIV  3 Ectopic           2 SAB  1 SAB               Social History        Social History  . Marital status: Married    Spouse name: N/A  . Number of children: N/A  . Years of education: N/A         Social History Main Topics  . Smoking status: Never Smoker  . Smokeless tobacco: Never Used  . Alcohol use 0.0 oz/week     Comment: rare  . Drug use: No  . Sexual activity: Yes    Birth control/ protection: Surgical       Other Topics Concern  . None      Social History Narrative  . None          Family History  Problem Relation Age of Onset  . Diabetes Father   . Hypertension Father   . CAD Father   . Diabetes Sister     non-Insulin dependent diabetes mellitus  . Heart disease Brother   . Cancer Daughter     cancer of the cervix at age 69-18  . Heart disease Brother   . Breast cancer Paternal Aunt   . Colon cancer Paternal Uncle   . Ovarian cancer Neg Hx      (Not in a hospital admission)       Allergies    Allergen Reactions  . Hydrocodone Itching  . Mirtazapine     Weight gain    Review of Systems (Updated 08/12/2016) Constitutional: No recent fever/chills/sweats Respiratory: No recent cough/bronchitis Cardiovascular: No chest pain Gastrointestinal: No recent nausea/vomiting/diarrhea Genitourinary: No UTI symptoms Hematologic/lymphatic:No history of coagulopathy or recent blood thinner use    Objective:  Updated 2/28/ 2018 (no physical exam changes other than vital signs) BP 103/71   Pulse (!) 108   Ht 5\' 8"  (1.727 m)   Wt 209 lb 12.8 oz (95.2 kg)   BMI 31.90 kg/m    General:   Normal  Skin:   normal  HEENT:  Normal  Neck:  Supple without Adenopathy or Thyromegaly  Lungs:   Heart:              Breasts:   Abdomen:  Pelvis:  M/S   Extremeties:  Neuro:    clear to auscultation bilaterally   Normal without murmur   Not Examined   soft, non-tender; bowel sounds normal; no masses,  no organomegaly   Exam deferred to OR  No CVAT  Warm/Dry   Normal       05/29/2016 PELVIC: External Genitalia: Normal BUS: Normal Vagina: Second degree cystocele; good support at the urethrovesical junction; largerectocele;questionableenterocele; fair estrogen effectof vaginal mucosa Cervix: Surgically absent Uterus: Surgically absent Adnexa: Normal; nonpalpable nontender RV: External Exam NormaI, No Rectal Masses and Normal Sphincter tone  Assessment:    1. Symptomatic rectocele 2. Probable enterocele 3. Second degree cystocele with good support at the urethrovesical junction, asymptomatic   Plan:   Posterior colporrhaphy with enterocele ligation, possible anterior colporrhaphy  Preoperative counseling: (Updated 08/20/2016) Patient is to undergo posterior colporrhaphy with enterocele ligation and possible anterior colporrhaphy on 08/25/2016. She is understanding of the planned  procedure and is aware of and is accepting of all surgical risks which include but are not limited to bleeding, infection, pelvic organ injury with need for repair, blood clot disorders, anesthesia risks, etc. All questions have been answered. Informed consent is given. Patient is ready and willing to proceed with surgery as scheduled. While patient is  not currently sexually active, we will maintain vaginal caliber in order to facilitate sexual intercourse in the future   Brayton Mars, MD  (08/20/2016) UPDATED  Note: This dictation was prepared with Dragon dictation along with smaller phrase technology. Any transcriptional errors that result from this process are unintentional.      Electronically signed by Brayton Mars, MD at 07/01/2016 9:24 AM      Office Visit on 07/01/2016        Routing History        Detailed Report

## 2016-08-25 NOTE — Transfer of Care (Signed)
Immediate Anesthesia Transfer of Care Note  Patient: Courtney Ross  Procedure(s) Performed: Procedure(s): POSTERIOR REPAIR (RECTOCELE) (N/A) REPAIR OF ENTEROCELE (N/A)  Patient Location: PACU  Anesthesia Type:General  Level of Consciousness: awake  Airway & Oxygen Therapy: Patient connected to face mask oxygen  Post-op Assessment: Report given to RN and Post -op Vital signs reviewed and stable  Post vital signs: stable  Last Vitals:  Vitals:   08/25/16 0641 08/25/16 0938  BP: 128/76 115/64  Pulse: 92 86  Resp: 16 (!) 25  Temp: 36.9 C 36.6 C    Last Pain:  Vitals:   08/25/16 0938  TempSrc: Temporal         Complications: No apparent anesthesia complications

## 2016-08-25 NOTE — Anesthesia Post-op Follow-up Note (Cosign Needed)
Anesthesia QCDR form completed.        

## 2016-08-25 NOTE — Op Note (Signed)
OPERATIVE NOTE:  Courtney Ross PROCEDURE DATE: 08/25/2016   PREOPERATIVE DIAGNOSIS:  1. Symptomatic rectocele 2. Suspected enterocele 3. First-degree cystocele  POSTOPERATIVE DIAGNOSIS:  1. Symptomatic rectocele 2. Enterocele 3. First-degree cystocele, asymptomatic  PROCEDURE:  Posterior colporrhaphy with enterocele ligation  SURGEON:  Brayton Mars, MD ASSISTANTS: Dr. Marcelline Mates and PA-S Bevely Palmer  ANESTHESIA: General INDICATIONS: [name], presents for surgical management of symptomatic pelvic organ prolapse. Patient has a moderate rectocele which is responsive to conservative medical therapy. She also has suspected enterocele. First degree cystocele was present but is asymptomatic  FINDINGS:   Moderate rectocele; vaginal enterocele   I/O's: Total I/O In: -  Out: 425 [Urine:350; Blood:75] COUNTS:  YES SPECIMENS: None ANTIBIOTIC PROPHYLAXIS:N/A COMPLICATIONS: None immediate  PROCEDURE IN DETAIL: Patient was brought to the operating room placed in supine position. General endotracheal anesthesia was induced without difficulty. She was placed in the dorsal lithotomy position using candycane stirrups. A ChloraPrep and Hibiclens abdominal perineal intravaginal prep and drape was performed in standard fashion. Timeout was completed. Foley catheter was placed in the bladder and was draining clear yellow urine. Allis clamps were used to grasp the lateral margins of the introitus. A diamond shaped wedge of tissue was excised from the introitus removing previous scar tissue. The vaginal mucosa was undermined in the midline with Metzenbaum scissors and incised. Geraldine Contras retractors were used to facilitate exposure. This procedure of undermining the mucosa and incising in the midline and clamping with Geraldine Contras clamps was used up through the apex of the vagina. The perirectal fascia was dissected off the vaginal mucosa  through sharp and blunt dissection. The enterocele sac was identified and entered. The enterocele was then closed with a single pursestring stitch of 0 Vicryl suture. A second supporting stitch was likewise placed to enhance strength of repair. The posterior colporrhaphy was then performed using simple interrupted horizontal mattress sutures of 0 Vicryl. Good posterior shelf was created. Excess vaginal mucosa was trimmed and the vagina was then closed in the midline using simple interrupted sutures of 2-0 chromic. Once satisfied with the repair and hemostasis, the vagina was packed with Kerlix and Premarin cream. The patient was then awakened extubated and taken to room in satisfactory condition.  Twila Rappa A. Zipporah Plants, MD, ACOG [facility]

## 2016-08-25 NOTE — Anesthesia Preprocedure Evaluation (Signed)
Anesthesia Evaluation  Patient identified by MRN, date of birth, ID band Patient awake    Reviewed: Allergy & Precautions, H&P , Patient's Chart, lab work & pertinent test results  History of Anesthesia Complications (+) PONV, Family history of anesthesia reaction and history of anesthetic complications  Airway Mallampati: II  TM Distance: >3 FB Neck ROM: full    Dental  (+) Edentulous Upper   Pulmonary neg pulmonary ROS, former smoker,    Pulmonary exam normal        Cardiovascular hypertension, Pt. on medications negative cardio ROS Normal cardiovascular exam     Neuro/Psych  Headaches, PSYCHIATRIC DISORDERS Anxiety Depression    GI/Hepatic negative GI ROS, Neg liver ROS, GERD  Medicated and Controlled,  Endo/Other  negative endocrine ROSHypothyroidism   Renal/GU   Female GU complaint     Musculoskeletal  (+) Arthritis , Osteoarthritis,    Abdominal   Peds negative pediatric ROS (+)  Hematology negative hematology ROS (+)   Anesthesia Other Findings Past Medical History: No date: Anxiety No date: Back pain No date: Constipation No date: Degenerative disc disease, lumbar No date: Depression No date: Family history of adverse reaction to anesthes*     Comment: daughter nausea No date: Fatty liver No date: GERD (gastroesophageal reflux disease) No date: Headache     Comment: migraines 05/08/2015: Headache, migraine No date: High cholesterol No date: Hypertension No date: Lower back pain No date: Polycystic ovarian disease No date: PONV (postoperative nausea and vomiting) No date: Restless leg syndrome 01/29/2009: Spasm  Reproductive/Obstetrics                             Anesthesia Physical  Anesthesia Plan  ASA: II  Anesthesia Plan: General   Post-op Pain Management:    Induction: Intravenous and Rapid sequence  Airway Management Planned: Oral ETT  Additional  Equipment:   Intra-op Plan:   Post-operative Plan: Extubation in OR  Informed Consent: I have reviewed the patients History and Physical, chart, labs and discussed the procedure including the risks, benefits and alternatives for the proposed anesthesia with the patient or authorized representative who has indicated his/her understanding and acceptance.     Plan Discussed with: CRNA  Anesthesia Plan Comments:         Anesthesia Quick Evaluation

## 2016-08-25 NOTE — Anesthesia Postprocedure Evaluation (Signed)
Anesthesia Post Note  Patient: Courtney Ross  Procedure(s) Performed: Procedure(s) (LRB): POSTERIOR REPAIR (RECTOCELE) (N/A) REPAIR OF ENTEROCELE (N/A)  Patient location during evaluation: PACU Anesthesia Type: General Level of consciousness: awake and alert and oriented Pain management: pain level controlled Vital Signs Assessment: post-procedure vital signs reviewed and stable Respiratory status: spontaneous breathing Cardiovascular status: blood pressure returned to baseline Anesthetic complications: no     Last Vitals:  Vitals:   08/25/16 1200 08/25/16 1300  BP: (!) 108/55 118/62  Pulse: 82 85  Resp: 18   Temp: 36.7 C 36.8 C    Last Pain:  Vitals:   08/25/16 1323  TempSrc:   PainSc: 6                  Dung Salinger

## 2016-08-25 NOTE — Anesthesia Procedure Notes (Signed)
Procedure Name: Intubation Date/Time: 08/25/2016 7:43 AM Performed by: Aline Brochure Pre-anesthesia Checklist: Patient identified, Emergency Drugs available, Suction available and Patient being monitored Patient Re-evaluated:Patient Re-evaluated prior to inductionOxygen Delivery Method: Circle system utilized Preoxygenation: Pre-oxygenation with 100% oxygen Intubation Type: IV induction Ventilation: Mask ventilation without difficulty Laryngoscope Size: Mac and 3 Grade View: Grade I Tube type: Oral Tube size: 7.0 mm Number of attempts: 1 Airway Equipment and Method: Stylet Placement Confirmation: ETT inserted through vocal cords under direct vision,  positive ETCO2 and breath sounds checked- equal and bilateral Secured at: 22 cm Tube secured with: Tape Dental Injury: Teeth and Oropharynx as per pre-operative assessment

## 2016-08-25 NOTE — Interval H&P Note (Signed)
History and Physical Interval Note:  08/25/2016 7:26 AM  Courtney Ross  has presented today for surgery, with the diagnosis of RECTOCELE, CYSTOCELE,UNSTABLE BLADDER  The various methods of treatment have been discussed with the patient and family. After consideration of risks, benefits and other options for treatment, the patient has consented to  Procedure(s): POSTERIOR REPAIR (RECTOCELE) (N/A) REPAIR OF ENTEROCELE (N/A) ANTERIOR REPAIR (CYSTOCELE) (N/A) as a surgical intervention .  The patient's history has been reviewed, patient examined, no change in status, stable for surgery.  I have reviewed the patient's chart and labs.  Questions were answered to the patient's satisfaction.     Hassell Done A [name]

## 2016-08-26 DIAGNOSIS — F419 Anxiety disorder, unspecified: Secondary | ICD-10-CM | POA: Diagnosis not present

## 2016-08-26 DIAGNOSIS — E282 Polycystic ovarian syndrome: Secondary | ICD-10-CM | POA: Diagnosis not present

## 2016-08-26 DIAGNOSIS — N815 Vaginal enterocele: Secondary | ICD-10-CM | POA: Diagnosis not present

## 2016-08-26 DIAGNOSIS — N8111 Cystocele, midline: Secondary | ICD-10-CM | POA: Diagnosis not present

## 2016-08-26 DIAGNOSIS — N816 Rectocele: Secondary | ICD-10-CM | POA: Diagnosis not present

## 2016-08-26 DIAGNOSIS — E7800 Pure hypercholesterolemia, unspecified: Secondary | ICD-10-CM | POA: Diagnosis not present

## 2016-08-26 LAB — HEMOGLOBIN: Hemoglobin: 9.9 g/dL — ABNORMAL LOW (ref 12.0–16.0)

## 2016-08-26 MED ORDER — OXYCODONE HCL 15 MG PO TABS: 15.0000 mg | ORAL_TABLET | Freq: Four times a day (QID) | ORAL | 0 refills | Status: DC | PRN

## 2016-08-26 MED ORDER — IBUPROFEN 800 MG PO TABS: 800.0000 mg | ORAL_TABLET | Freq: Three times a day (TID) | ORAL | 1 refills | Status: DC

## 2016-08-26 MED ORDER — DOCUSATE SODIUM 100 MG PO CAPS: 100.0000 mg | ORAL_CAPSULE | Freq: Two times a day (BID) | ORAL | 0 refills | Status: DC

## 2016-08-26 NOTE — Progress Notes (Signed)
1 Day Post-Op Procedure(s) (LRB): POSTERIOR REPAIR (RECTOCELE) (N/A) REPAIR OF ENTEROCELE (N/A)  Subjective: Patient reports tolerating fluids. Catheter just removed this morning. No flatus. Postop pain is controlled with nonsteroidals  Objective: I have reviewed patient's vital signs, intake and output and medications.  General: alert, cooperative and no distress Resp: clear to auscultation bilaterally Cardio: regular rate and rhythm, S1, S2 normal, no murmur, click, rub or gallop GI: soft, non-tender; bowel sounds normal; no masses,  no organomegaly Extremities: extremities normal, atraumatic, no cyanosis or edema and Homans sign is negative, no sign of DVT Vaginal Bleeding: minimal -packing removed  CBC Latest Ref Rng & Units 08/26/2016 08/20/2016 07/01/2016  WBC 3.6 - 11.0 K/uL - 6.2 5.9  Hemoglobin 12.0 - 16.0 g/dL 9.9(L) 13.1 12.6  Hematocrit 35.0 - 47.0 % - 38.8 38.3  Platelets 150 - 440 K/uL - 331 306   Assessment: s/p Procedure(s): POSTERIOR REPAIR (RECTOCELE) (N/A) REPAIR OF ENTEROCELE (N/A): stable, progressing well and tolerating diet  Plan: Advance diet Encourage ambulation Advance to PO medication Discontinue IV fluids Discharge home  LOS: 0 days    Hassell Done A Ashleah Valtierra 08/26/2016, 11:50 AM

## 2016-08-26 NOTE — Discharge Summary (Signed)
Physician Discharge Summary  Patient ID: Courtney Ross MRN: [id number] DOB/AGE: [id number] 62 y.o.  Admit date: 08/25/2016 Discharge date: 08/26/2016  Admission Diagnoses: Pelvic Organ Prolapse (rectocele, symptomatic; enterocele; cystocele, asymptomatic  Discharge Diagnoses:  Same as above  Operative Procedures: Procedure(s): POSTERIOR REPAIR (RECTOCELE) (N/A) REPAIR OF ENTEROCELE (N/A)  Hospital Course: Uncomplicated   Significant Diagnostic Studies:  Lab Results  Component Value Date   HGB 9.9 (L) 08/26/2016   HGB 13.1 08/20/2016   HGB 12.6 07/01/2016   Lab Results  Component Value Date   HCT 38.8 08/20/2016   HCT 38.3 07/01/2016   CBC Latest Ref Rng & Units 08/26/2016 08/20/2016 07/01/2016  WBC 3.6 - 11.0 K/uL - 6.2 5.9  Hemoglobin 12.0 - 16.0 g/dL 9.9(L) 13.1 12.6  Hematocrit 35.0 - 47.0 % - 38.8 38.3  Platelets 150 - 440 K/uL - 331 306     Discharged Condition: good  Discharge Exam: Blood pressure (!) 113/58, pulse 88, temperature 97.9 F (36.6 C), temperature source Oral, resp. rate 18, height 5\' 8"  (1.727 m), weight 209 lb (94.8 kg), SpO2 94 %. Incision/Wound: No significant vaginal bleeding patient was  Disposition: 01-Home or Self Care  Discharge Instructions    Discharge patient    Complete by:  As directed    Discharge disposition:  01-Home or Self Care   Discharge patient date:  08/26/2016     Allergies as of 08/26/2016      Reactions   Hydrocodone Itching   Mirtazapine    Weight gain      Medication List    TAKE these medications   aspirin EC 81 MG tablet Take 81 mg by mouth at bedtime.   buPROPion 150 MG 12 hr tablet Commonly known as:  WELLBUTRIN SR Take 1 tablet (150 mg total) by mouth 2 (two) times daily.   cyclobenzaprine 10 MG tablet Commonly known as:  FLEXERIL Take 10 mg by mouth 3 (three) times daily as needed for muscle spasms.   desvenlafaxine 100 MG 24 hr tablet Commonly known as:  PRISTIQ TAKE 1 TABLET EVERY DAY    diazepam 10 MG tablet Commonly known as:  VALIUM TAKE ONE TABLET FOUR TIMES A DAY   docusate sodium 100 MG capsule Commonly known as:  COLACE Take 1 capsule (100 mg total) by mouth 2 (two) times daily.   estradiol 1 MG tablet Commonly known as:  ESTRACE TAKE 1 TABLET EVERY DAY What changed:  See the new instructions.   ibuprofen 800 MG tablet Commonly known as:  ADVIL,MOTRIN Take 1 tablet (800 mg total) by mouth 3 (three) times daily. What changed:  medication strength  how much to take  when to take this  reasons to take this   LINZESS 145 MCG Caps capsule Generic drug:  linaclotide TAKE ONE CAPSULE DAILY   lisinopril 10 MG tablet Commonly known as:  PRINIVIL,ZESTRIL Take 1 tablet (10 mg total) by mouth daily.   meloxicam 15 MG tablet Commonly known as:  MOBIC Take 15 mg by mouth daily.   mirabegron ER 25 MG Tb24 tablet Commonly known as:  MYRBETRIQ Take 1 tablet (25 mg total) by mouth daily.   multivitamin with minerals Tabs tablet Take 1 tablet by mouth daily.   mupirocin cream 2 % Commonly known as:  BACTROBAN Apply 1 application topically 2 (two) times daily.   oxyCODONE 15 MG immediate release tablet Commonly known as:  ROXICODONE Take 1 tablet (15 mg total) by mouth every 6 (six) hours as needed for  moderate pain. What changed:  how much to take  when to take this  reasons to take this   polyethylene glycol powder powder Commonly known as:  GLYCOLAX/MIRALAX Take 17 g by mouth 2 (two) times daily as needed. What changed:  reasons to take this   pravastatin 40 MG tablet Commonly known as:  PRAVACHOL TAKE 1 TABLET BY MOUTH DAILY What changed:  See the new instructions.   pregabalin 75 MG capsule Commonly known as:  LYRICA TAKE ONE CAPSULE THREE TIMES A DAY   RESTASIS 0.05 % ophthalmic emulsion Generic drug:  cycloSPORINE Apply 1 drop to eye 2 (two) times daily.   Vitamin B-12 500 MCG Subl Place 500 mcg under the tongue daily.    Vitamin D3 5000 units Caps Take 5,000 Units by mouth daily.      Follow-up Information    Brayton Mars, MD. Go in 1 week.   Specialties:  Obstetrics and Gynecology, Radiology Why:  Post Op Check Contact information: [address] [place] [address] [phone]           Signed: Alanda Slim Concettina Leth 08/26/2016, 11:59 AM

## 2016-08-26 NOTE — Progress Notes (Signed)
Patient understands all discharge instructions and the need to make follow up appointments. Patient discharge via wheelchair with auxillary. 

## 2016-09-02 ENCOUNTER — Other Ambulatory Visit: Payer: Self-pay | Admitting: Family Medicine

## 2016-09-02 ENCOUNTER — Other Ambulatory Visit: Payer: Self-pay | Admitting: Physician Assistant

## 2016-09-02 DIAGNOSIS — I10 Essential (primary) hypertension: Secondary | ICD-10-CM

## 2016-09-02 NOTE — Telephone Encounter (Signed)
Please call in diazepam.  

## 2016-09-02 NOTE — Telephone Encounter (Signed)
Called into [facility]

## 2016-09-03 ENCOUNTER — Encounter: Payer: Self-pay | Admitting: Obstetrics and Gynecology

## 2016-09-03 ENCOUNTER — Ambulatory Visit (INDEPENDENT_AMBULATORY_CARE_PROVIDER_SITE_OTHER): Payer: Medicare Other | Admitting: Obstetrics and Gynecology

## 2016-09-03 VITALS — BP 136/82 | HR 116 | Ht 68.0 in | Wt 206.9 lb

## 2016-09-03 DIAGNOSIS — Z23 Encounter for immunization: Secondary | ICD-10-CM | POA: Diagnosis not present

## 2016-09-03 DIAGNOSIS — K469 Unspecified abdominal hernia without obstruction or gangrene: Secondary | ICD-10-CM

## 2016-09-03 DIAGNOSIS — Z09 Encounter for follow-up examination after completed treatment for conditions other than malignant neoplasm: Secondary | ICD-10-CM | POA: Diagnosis not present

## 2016-09-03 DIAGNOSIS — N816 Rectocele: Secondary | ICD-10-CM

## 2016-09-03 NOTE — Patient Instructions (Addendum)
1. Continue taking stool softeners once or twice a day to keep bowel movements soft 2. Continue with limited lifting to avoid suture breakdown from repair 3. Return in 4 weeks for final postop check  Tdap Vaccine (Tetanus, Diphtheria and Pertussis): What You Need to Know 1. Why get vaccinated? Tetanus, diphtheria and pertussis are very serious diseases. Tdap vaccine can protect [place] from these diseases. And, Tdap vaccine given to pregnant women can protect newborn babies against pertussis. TETANUS (Lockjaw) is rare in the [place] today. It causes painful muscle tightening and stiffness, usually all over the body.  It can lead to tightening of muscles in the head and neck so you can't open your mouth, swallow, or sometimes even breathe. Tetanus kills about 1 out of 10 people who are infected even after receiving the best medical care. DIPHTHERIA is also rare in the [place] today. It can cause a thick coating to form in the back of the throat.  It can lead to breathing problems, heart failure, paralysis, and death. PERTUSSIS (Whooping Cough) causes severe coughing spells, which can cause difficulty breathing, vomiting and disturbed sleep.  It can also lead to weight loss, incontinence, and rib fractures. Up to 2 in 100 adolescents and 5 in 100 adults with pertussis are hospitalized or have complications, which could include pneumonia or death. These diseases are caused by bacteria. Diphtheria and pertussis are spread from person to person through secretions from coughing or sneezing. Tetanus enters the body through cuts, scratches, or wounds. Before vaccines, as many as 200,000 cases of diphtheria, 200,000 cases of pertussis, and hundreds of cases of tetanus, were reported in the [place] each year. Since vaccination began, reports of cases for tetanus and diphtheria have dropped by about 99% and for pertussis by about 80%. 2. Tdap vaccine Tdap vaccine can protect adolescents and  adults from tetanus, diphtheria, and pertussis. One dose of Tdap is routinely given at age 42 or 76. People who did not get Tdap at that age should get it as soon as possible. Tdap is especially important for healthcare professionals and anyone having close contact with a baby younger than 12 months. Pregnant women should get a dose of Tdap during every pregnancy, to protect the newborn from pertussis. Infants are most at risk for severe, life-threatening complications from pertussis. Another vaccine, called Td, protects against tetanus and diphtheria, but not pertussis. A Td booster should be given every 10 years. Tdap may be given as one of these boosters if you have never gotten Tdap before. Tdap may also be given after a severe cut or burn to prevent tetanus infection. Your doctor or the person giving you the vaccine can give you more information. Tdap may safely be given at the same time as other vaccines. 3. Some people should not get this vaccine  A person who has ever had a life-threatening allergic reaction after a previous dose of any diphtheria, tetanus or pertussis containing vaccine, OR has a severe allergy to any part of this vaccine, should not get Tdap vaccine. Tell the person giving the vaccine about any severe allergies.  Anyone who had coma or long repeated seizures within 7 days after a childhood dose of DTP or DTaP, or a previous dose of Tdap, should not get Tdap, unless a cause other than the vaccine was found. They can still get Td.  Talk to your doctor if you:  have seizures or another nervous system problem,  had severe pain or swelling after  any vaccine containing diphtheria, tetanus or pertussis,  ever had a condition called Guillain-Barr Syndrome (GBS),  aren't feeling well on the day the shot is scheduled. 4. Risks With any medicine, including vaccines, there is a chance of side effects. These are usually mild and go away on their own. Serious reactions are also  possible but are rare. Most people who get Tdap vaccine do not have any problems with it. Mild problems following Tdap:  (Did not interfere with activities)  Pain where the shot was given (about 3 in 4 adolescents or 2 in 3 adults)  Redness or swelling where the shot was given (about 1 person in 5)  Mild fever of at least 100.83F (up to about 1 in 25 adolescents or 1 in 100 adults)  Headache (about 3 or 4 people in 10)  Tiredness (about 1 person in 3 or 4)  Nausea, vomiting, diarrhea, stomach ache (up to 1 in 4 adolescents or 1 in 10 adults)  Chills, sore joints (about 1 person in 10)  Body aches (about 1 person in 3 or 4)  Rash, swollen glands (uncommon) Moderate problems following Tdap:  (Interfered with activities, but did not require medical attention)  Pain where the shot was given (up to 1 in 5 or 6)  Redness or swelling where the shot was given (up to about 1 in 16 adolescents or 1 in 12 adults)  Fever over 102F (about 1 in 100 adolescents or 1 in 250 adults)  Headache (about 1 in 7 adolescents or 1 in 10 adults)  Nausea, vomiting, diarrhea, stomach ache (up to 1 or 3 people in 100)  Swelling of the entire arm where the shot was given (up to about 1 in 500). Severe problems following Tdap:  (Unable to perform usual activities; required medical attention)  Swelling, severe pain, bleeding and redness in the arm where the shot was given (rare). Problems that could happen after any vaccine:   People sometimes faint after a medical procedure, including vaccination. Sitting or lying down for about 15 minutes can help prevent fainting, and injuries caused by a fall. Tell your doctor if you feel dizzy, or have vision changes or ringing in the ears.  Some people get severe pain in the shoulder and have difficulty moving the arm where a shot was given. This happens very rarely.  Any medication can cause a severe allergic reaction. Such reactions from a vaccine are very  rare, estimated at fewer than 1 in a million doses, and would happen within a few minutes to a few hours after the vaccination. As with any medicine, there is a very remote chance of a vaccine causing a serious injury or death. The safety of vaccines is always being monitored. For more information, visit: http://www.aguilar.org/ 5. What if there is a serious problem? What should I look for?  Look for anything that concerns you, such as signs of a severe allergic reaction, very high fever, or unusual behavior. Signs of a severe allergic reaction can include hives, swelling of the face and throat, difficulty breathing, a fast heartbeat, dizziness, and weakness. These would usually start a few minutes to a few hours after the vaccination. What should I do?   If you think it is a severe allergic reaction or other emergency that can't wait, call 9-1-1 or get the person to the nearest hospital. Otherwise, call your doctor.  Afterward, the reaction should be reported to the Vaccine Adverse Event Reporting System (VAERS). Your doctor might  file this report, or you can do it yourself through the VAERS web site at www.vaers.SamedayNews.es, or by calling 248-806-9464.  VAERS does not give medical advice. 6. The National Vaccine Injury Compensation Program The [employer] Vaccine Injury Compensation Program (VICP) is a federal program that was created to compensate people who may have been injured by certain vaccines. Persons who believe they may have been injured by a vaccine can learn about the program and about filing a claim by calling 4094084810 or visiting the [employer] website at GoldCloset.com.ee. There is a time limit to file a claim for compensation. 7. How can I learn more?  Ask your doctor. He or she can give you the vaccine package insert or suggest other sources of information.  Call your local or state health department.  Contact the Centers for Disease Control and Prevention  (CDC):  Call 867-090-8032 (1-800-CDC-INFO) or  Visit CDC's website at http://hunter.com/ CDC Tdap Vaccine VIS (08/16/13) This information is not intended to replace advice given to you by your health care provider. Make sure you discuss any questions you have with your health care provider. Document Released: 12/09/2011 Document Revised: 02/28/2016 Document Reviewed: 02/28/2016 Elsevier Interactive Patient Education  2017 [employer].

## 2016-09-03 NOTE — Progress Notes (Signed)
Chief complaint: 1. Postop check 2. Status post posterior colporrhaphy with enterocele ligation  Patient presents for follow-up 1 week after surgery. Bowel function is normal without any for straining; she does have occasional diarrhea due to her irritable bowel syndrome. She is taking intermittent pain medication including Percocet and ibuprofen; she is tender when sitting  OBJECTIVE: BP 136/82   Pulse (!) 116   Ht 5\' 8"  (1.727 m)   Wt 206 lb 14.4 oz (93.8 kg)   BMI 31.46 kg/m  Physical exam-deferred  ASSESSMENT: 1. One week status post posterior colporrhaphy with enterocele ligation-normal progress 2. IBS-stable  PLAN: 1. Continue with routine postoperative precautions 2. Return in 4 weeks for final postop check 3. Continue with stool softeners as written  Brayton Mars, MD  Note: This dictation was prepared with Dragon dictation along with smaller phrase technology. Any transcriptional errors that result from this process are unintentional.

## 2016-09-08 ENCOUNTER — Other Ambulatory Visit: Payer: Self-pay | Admitting: Family Medicine

## 2016-09-08 MED ORDER — OXYCODONE HCL 15 MG PO TABS: 15.0000 mg | ORAL_TABLET | Freq: Four times a day (QID) | ORAL | 0 refills | Status: DC | PRN

## 2016-09-08 NOTE — Telephone Encounter (Signed)
Please review. Thanks!  

## 2016-09-08 NOTE — Telephone Encounter (Signed)
Pt contacted office for refill request on the following medications: oxyCODONE (ROXICODONE) 15 MG immediate release tablet Last Rx: 08/11/16 Pt stated that she did get an Rx from Dr. Enzo Bi after she had surgery but once she realized it was the same medication that Dr. Caryn Section was giving her she destroyed the Rx and didn't have it filled. Please advise. Thanks TNP

## 2016-09-09 ENCOUNTER — Other Ambulatory Visit: Payer: Self-pay | Admitting: Family Medicine

## 2016-09-09 MED ORDER — OXYCODONE HCL 15 MG PO TABS: 15.0000 mg | ORAL_TABLET | Freq: Four times a day (QID) | ORAL | 0 refills | Status: DC | PRN

## 2016-10-01 ENCOUNTER — Encounter: Payer: Self-pay | Admitting: Obstetrics and Gynecology

## 2016-10-01 ENCOUNTER — Ambulatory Visit (INDEPENDENT_AMBULATORY_CARE_PROVIDER_SITE_OTHER): Payer: Medicare Other | Admitting: Obstetrics and Gynecology

## 2016-10-01 VITALS — BP 141/88 | HR 125 | Ht 68.0 in | Wt 207.8 lb

## 2016-10-01 DIAGNOSIS — Z09 Encounter for follow-up examination after completed treatment for conditions other than malignant neoplasm: Secondary | ICD-10-CM

## 2016-10-01 DIAGNOSIS — N816 Rectocele: Secondary | ICD-10-CM

## 2016-10-01 DIAGNOSIS — K469 Unspecified abdominal hernia without obstruction or gangrene: Secondary | ICD-10-CM

## 2016-10-01 NOTE — Patient Instructions (Signed)
1. Resume activities as tolerated 2. Return in 3 months for follow-up

## 2016-10-01 NOTE — Progress Notes (Signed)
Chief complaint: 1. Final postop check 2. Status post posterior colporrhaphy with enterocele ligation  Patient presents for final postop check 6 weeks after surgery for repair of rectocele and enterocele. Bowel and bladder function are normal. Patient is not having to strain with bowel movements. She is not experiencing any significant pelvic pain. She is not having any vaginal discharge or vaginal route.  OBJECTIVE: BP (!) 141/88   Pulse (!) 125   Ht 5\' 8"  (1.727 m)   Wt 207 lb 12.8 oz (94.3 kg)   BMI 31.60 kg/m  Pleasant female in no acute distress. Alert and oriented. Abdomen: Soft, nontender without organomegaly Pelvic: External genitalia normal BUS-normal Vagina-excellent vaginal walls support; vaginal cuff is intact; posterior vaginal wall slightly prominent with palpable suture and scar tissue, but nontender. Rectovaginal-external hemorrhoids, nonthrombosed present; internal exam reveals normal sphincter tone and excellent posterior shelf  ASSESSMENT: 1. Normal final postop check status post posterior colporrhaphy with enterocele ligation for management of symptomatic rectocele/enterocele 2. Minimal vaginal wall fibrosis and palpable suture present, asymptomatic  PLAN: 1. Resume activities as tolerated 2. Return in 3 months for follow-up  Brayton Mars, MD  Note: This dictation was prepared with Dragon dictation along with smaller phrase technology. Any transcriptional errors that result from this process are unintentional.

## 2016-10-06 ENCOUNTER — Other Ambulatory Visit: Payer: Self-pay | Admitting: Family Medicine

## 2016-10-06 ENCOUNTER — Other Ambulatory Visit: Payer: Self-pay | Admitting: Physician Assistant

## 2016-10-06 NOTE — Telephone Encounter (Signed)
Please advise 

## 2016-10-06 NOTE — Telephone Encounter (Signed)
Pt needs refill on her   oxyCODONE (ROXICODONE) 15 MG immediate release tablet  Thanks, [employer]

## 2016-10-07 MED ORDER — OXYCODONE HCL 15 MG PO TABS: 15.0000 mg | ORAL_TABLET | Freq: Four times a day (QID) | ORAL | 0 refills | Status: DC | PRN

## 2016-10-07 NOTE — Telephone Encounter (Signed)
Please call in Lyrica

## 2016-10-07 NOTE — Telephone Encounter (Signed)
Rx called in to pharmacy. 

## 2016-10-08 ENCOUNTER — Ambulatory Visit (INDEPENDENT_AMBULATORY_CARE_PROVIDER_SITE_OTHER): Payer: Medicare Other | Admitting: Family Medicine

## 2016-10-08 ENCOUNTER — Encounter: Payer: Self-pay | Admitting: Family Medicine

## 2016-10-08 VITALS — BP 142/84 | HR 96 | Temp 98.8°F | Resp 16 | Wt 210.0 lb

## 2016-10-08 DIAGNOSIS — R059 Cough, unspecified: Secondary | ICD-10-CM

## 2016-10-08 DIAGNOSIS — R05 Cough: Secondary | ICD-10-CM | POA: Diagnosis not present

## 2016-10-08 MED ORDER — BENZONATATE 100 MG PO CAPS: 100.0000 mg | ORAL_CAPSULE | Freq: Three times a day (TID) | ORAL | 1 refills | Status: DC | PRN

## 2016-10-08 NOTE — Progress Notes (Signed)
Patient: Courtney Ross Female    DOB: Apr 06, 1956   61 y.o.   MRN: [id number] Visit Date: 10/08/2016  Today's Provider: Lelon Huh, MD   Chief Complaint  Patient presents with  . Cough   Subjective:    Cough  This is a new problem. The current episode started more than 1 month ago (about 2 months). The problem has been gradually worsening. The problem occurs constantly. The cough is non-productive. Pertinent negatives include no chest pain, fever, headaches, postnasal drip, shortness of breath or wheezing. Nothing aggravates the symptoms. She has tried rest (cough drops and hard candy) for the symptoms. The treatment provided no relief. There is no history of asthma or environmental allergies.   Patient thinks that her cough could be medication related. She reports that she has discontinued Lisinopril for the last 2 days to see if it will resolve her cough. Has  Any time of day. Sometimes keeps her up at night. Has had a little bit sinus drainage, no fevers. Occasional heartburn.     Allergies  Allergen Reactions  . Hydrocodone Itching  . Mirtazapine     Weight gain     Current Outpatient Prescriptions:  .  aspirin EC 81 MG tablet, Take 81 mg by mouth at bedtime., Disp: , Rfl:  .  buPROPion (WELLBUTRIN SR) 150 MG 12 hr tablet, Take 1 tablet (150 mg total) by mouth 2 (two) times daily., Disp: 60 tablet, Rfl: 6 .  Cholecalciferol (VITAMIN D3) 5000 UNITS CAPS, Take 5,000 Units by mouth daily. , Disp: , Rfl:  .  Cyanocobalamin (VITAMIN B-12) 500 MCG SUBL, Place 500 mcg under the tongue daily., Disp: , Rfl:  .  cyclobenzaprine (FLEXERIL) 10 MG tablet, Take 10 mg by mouth 3 (three) times daily as needed for muscle spasms., Disp: , Rfl:  .  desvenlafaxine (PRISTIQ) 100 MG 24 hr tablet, TAKE 1 TABLET EVERY DAY, Disp: 30 tablet, Rfl: 12 .  diazepam (VALIUM) 10 MG tablet, TAKE 1 TABLET BY MOUTH 4 TIMES DAILY, Disp: 120 tablet, Rfl: 5 .  docusate sodium (COLACE) 100 MG capsule,  Take 1 capsule (100 mg total) by mouth 2 (two) times daily., Disp: 10 capsule, Rfl: 0 .  estradiol (ESTRACE) 1 MG tablet, TAKE 1 TABLET EVERY DAY (Patient taking differently: TAKE 1 TABLET EVERY DAY AT NIGHT), Disp: 90 tablet, Rfl: 4 .  ibuprofen (ADVIL,MOTRIN) 800 MG tablet, Take 1 tablet (800 mg total) by mouth 3 (three) times daily., Disp: 50 tablet, Rfl: 1 .  LINZESS 145 MCG CAPS capsule, TAKE ONE CAPSULE DAILY, Disp: 30 capsule, Rfl: 12 .  lisinopril (PRINIVIL,ZESTRIL) 10 MG tablet, TAKE 1 TABLET BY MOUTH DAILY, Disp: 90 tablet, Rfl: 4 .  LYRICA 75 MG capsule, TAKE 1 CAPSULE BY MOUTH 3 TIMES DAILY, Disp: 90 capsule, Rfl: 5 .  meloxicam (MOBIC) 15 MG tablet, Take 15 mg by mouth daily. , Disp: , Rfl:  .  mirabegron ER (MYRBETRIQ) 25 MG TB24 tablet, Take 1 tablet (25 mg total) by mouth daily., Disp: 30 tablet, Rfl: 6 .  Multiple Vitamin (MULTIVITAMIN WITH MINERALS) TABS tablet, Take 1 tablet by mouth daily., Disp: , Rfl:  .  mupirocin cream (BACTROBAN) 2 %, Apply 1 application topically 2 (two) times daily., Disp: 15 g, Rfl: 1 .  oxyCODONE (ROXICODONE) 15 MG immediate release tablet, Take 1-2 tablets (15-30 mg total) by mouth 4 (four) times daily as needed., Disp: 180 tablet, Rfl: 0 .  polyethylene glycol powder (  GLYCOLAX/MIRALAX) powder, Take 17 g by mouth 2 (two) times daily as needed. (Patient taking differently: Take 17 g by mouth 2 (two) times daily as needed (FOR CONSTIPATION). ), Disp: 3350 g, Rfl: 2 .  pravastatin (PRAVACHOL) 40 MG tablet, TAKE 1 TABLET BY MOUTH DAILY (Patient taking differently: TAKE 1 TABLET BY MOUTH DAILY AT NIGHT), Disp: 30 tablet, Rfl: 12 .  RESTASIS 0.05 % ophthalmic emulsion, Apply 1 drop to eye 2 (two) times daily., Disp: , Rfl:   Review of Systems  Constitutional: Negative for fever.  HENT: Negative for postnasal drip.   Respiratory: Positive for cough. Negative for shortness of breath and wheezing.   Cardiovascular: Negative for chest pain.    Allergic/Immunologic: Negative for environmental allergies.  Neurological: Negative for headaches.    Social History  Substance Use Topics  . Smoking status: Never Smoker  . Smokeless tobacco: Never Used  . Alcohol use 0.0 oz/week     Comment: rare 1-2 drinks per year   Objective:   BP (!) 142/84 (BP Location: Left Arm, Patient Position: Sitting, Cuff Size: Normal)   Pulse 96   Temp 98.8 F (37.1 C)   Resp 16   Wt 210 lb (95.3 kg)   BMI 31.93 kg/m     Physical Exam  General Appearance:    Alert, cooperative, no distress  HENT:   ENT exam normal, no neck nodes or sinus tenderness  Eyes:    PERRL, conjunctiva/corneas clear, EOM's intact       Lungs:     Clear to auscultation bilaterally, respirations unlabored  Heart:    Regular rate and rhythm  Neurologic:   Awake, alert, oriented x 3. No apparent focal neurological           defect.           Assessment & Plan:     1. Cough Discussed common causes of chronic cough. Considering absence of other symptoms ACEI cough is likely cause. Will keep lisinopril on hold for 2 weeks and may take tessalon prn. Will follow up in 3 weeks. If not resolved will need to pursue further evaluation.  - benzonatate (TESSALON) 100 MG capsule; Take 1 capsule (100 mg total) by mouth 3 (three) times daily as needed for cough.  Dispense: 30 capsule; Refill: 1       Lelon Huh, MD  [facility]

## 2016-10-29 ENCOUNTER — Ambulatory Visit (INDEPENDENT_AMBULATORY_CARE_PROVIDER_SITE_OTHER): Payer: Medicare Other | Admitting: Family Medicine

## 2016-10-29 ENCOUNTER — Encounter: Payer: Self-pay | Admitting: Family Medicine

## 2016-10-29 ENCOUNTER — Ambulatory Visit: Payer: Medicare Other | Admitting: Family Medicine

## 2016-10-29 VITALS — BP 130/82 | HR 102 | Temp 99.0°F | Resp 16 | Wt 213.0 lb

## 2016-10-29 DIAGNOSIS — G4710 Hypersomnia, unspecified: Secondary | ICD-10-CM | POA: Diagnosis not present

## 2016-10-29 DIAGNOSIS — I10 Essential (primary) hypertension: Secondary | ICD-10-CM | POA: Diagnosis not present

## 2016-10-29 DIAGNOSIS — R059 Cough, unspecified: Secondary | ICD-10-CM

## 2016-10-29 DIAGNOSIS — R05 Cough: Secondary | ICD-10-CM

## 2016-10-29 NOTE — Progress Notes (Signed)
Patient: Courtney Ross Female    DOB: [birth year]   61 y.o.   MRN: [id number] Visit Date: 10/29/2016  Today's Provider: Lelon Huh, MD   Chief Complaint  Patient presents with  . Cough    follow up   Subjective:    HPI Follow up of cough:  Patient was last seen for this problem 3 weeks ago. Management during that visit includes advising patient to keep Lisinopril on hold, and start taking  Tessalon as needed. Today patient comes in reporting good compliance with treatment. Since last visit, her cough has resolved. Patient has been checking her blood pressure at home and the systolic readings have been in the 140s and 150s. She states she has felt more energetic since stopping lisinopril   She states her daughter has seen her stop breathing when sleeping. She knows she snores loudly and wakes herself up from snoring. She often feels sleepy during the day.     Allergies  Allergen Reactions  . Hydrocodone Itching  . Mirtazapine     Weight gain     Current Outpatient Prescriptions:  .  aspirin EC 81 MG tablet, Take 81 mg by mouth at bedtime., Disp: , Rfl:  .  benzonatate (TESSALON) 100 MG capsule, Take 1 capsule (100 mg total) by mouth 3 (three) times daily as needed for cough., Disp: 30 capsule, Rfl: 1 .  buPROPion (WELLBUTRIN SR) 150 MG 12 hr tablet, Take 1 tablet (150 mg total) by mouth 2 (two) times daily., Disp: 60 tablet, Rfl: 6 .  Cholecalciferol (VITAMIN D3) 5000 UNITS CAPS, Take 5,000 Units by mouth daily. , Disp: , Rfl:  .  Cyanocobalamin (VITAMIN B-12) 500 MCG SUBL, Place 500 mcg under the tongue daily., Disp: , Rfl:  .  cyclobenzaprine (FLEXERIL) 10 MG tablet, Take 10 mg by mouth 3 (three) times daily as needed for muscle spasms., Disp: , Rfl:  .  desvenlafaxine (PRISTIQ) 100 MG 24 hr tablet, TAKE 1 TABLET EVERY DAY, Disp: 30 tablet, Rfl: 12 .  diazepam (VALIUM) 10 MG tablet, TAKE 1 TABLET BY MOUTH 4 TIMES DAILY, Disp: 120 tablet, Rfl: 5 .  docusate sodium  (COLACE) 100 MG capsule, Take 1 capsule (100 mg total) by mouth 2 (two) times daily., Disp: 10 capsule, Rfl: 0 .  estradiol (ESTRACE) 1 MG tablet, TAKE 1 TABLET EVERY DAY (Patient taking differently: TAKE 1 TABLET EVERY DAY AT NIGHT), Disp: 90 tablet, Rfl: 4 .  ibuprofen (ADVIL,MOTRIN) 800 MG tablet, Take 1 tablet (800 mg total) by mouth 3 (three) times daily., Disp: 50 tablet, Rfl: 1 .  LINZESS 145 MCG CAPS capsule, TAKE ONE CAPSULE DAILY, Disp: 30 capsule, Rfl: 12 .  LYRICA 75 MG capsule, TAKE 1 CAPSULE BY MOUTH 3 TIMES DAILY, Disp: 90 capsule, Rfl: 5 .  meloxicam (MOBIC) 15 MG tablet, Take 15 mg by mouth daily. , Disp: , Rfl:  .  mirabegron ER (MYRBETRIQ) 25 MG TB24 tablet, Take 1 tablet (25 mg total) by mouth daily., Disp: 30 tablet, Rfl: 6 .  Multiple Vitamin (MULTIVITAMIN WITH MINERALS) TABS tablet, Take 1 tablet by mouth daily., Disp: , Rfl:  .  mupirocin cream (BACTROBAN) 2 %, Apply 1 application topically 2 (two) times daily., Disp: 15 g, Rfl: 1 .  oxyCODONE (ROXICODONE) 15 MG immediate release tablet, Take 1-2 tablets (15-30 mg total) by mouth 4 (four) times daily as needed., Disp: 180 tablet, Rfl: 0 .  polyethylene glycol powder (GLYCOLAX/MIRALAX) powder, Take 17 g  by mouth 2 (two) times daily as needed. (Patient taking differently: Take 17 g by mouth 2 (two) times daily as needed (FOR CONSTIPATION). ), Disp: 3350 g, Rfl: 2 .  pravastatin (PRAVACHOL) 40 MG tablet, TAKE 1 TABLET BY MOUTH DAILY (Patient taking differently: TAKE 1 TABLET BY MOUTH DAILY AT NIGHT), Disp: 30 tablet, Rfl: 12 .  RESTASIS 0.05 % ophthalmic emulsion, Apply 1 drop to eye 2 (two) times daily., Disp: , Rfl:   Review of Systems  Constitutional: Negative for appetite change, chills, fatigue and fever.  Respiratory: Negative for chest tightness and shortness of breath.   Cardiovascular: Negative for chest pain and palpitations.  Gastrointestinal: Negative for abdominal pain, nausea and vomiting.  Neurological: Negative  for dizziness and weakness.    Social History  Substance Use Topics  . Smoking status: Never Smoker  . Smokeless tobacco: Never Used  . Alcohol use 0.0 oz/week     Comment: rare 1-2 drinks per year   Objective:   BP 130/82 (BP Location: Left Arm, Patient Position: Sitting, Cuff Size: Large)   Pulse (!) 102   Temp 99 F (37.2 C) (Oral)   Resp 16   Wt 213 lb (96.6 kg)   SpO2 97% Comment: room air  BMI 32.39 kg/m     Physical Exam   General Appearance:    Alert, cooperative, no distress  Eyes:    PERRL, conjunctiva/corneas clear, EOM's intact       Lungs:     Clear to auscultation bilaterally, respirations unlabored  Heart:    Regular rate and rhythm  Neurologic:   Awake, alert, oriented x 3. No apparent focal neurological           defect.      Epworth=16     Assessment & Plan:     1. Cough Resolved since stopping ACEI.   2. Essential hypertension She has been working hard on diet and exercise since stopping lisinopril. She feels more energetic off of medications and would like to try controlling BP without medications. Will stay off of medications for the time being. She is also undergoing evaluation for apnea which may affect BP. Will recheck BP after sleepy study is done and treatment for apnea started if needed.   3. Hypersomnia  - Nocturnal polysomnography (NPSG); Future       Lelon Huh, MD  [facility]

## 2016-11-07 ENCOUNTER — Other Ambulatory Visit: Payer: Self-pay | Admitting: Family Medicine

## 2016-11-07 MED ORDER — OXYCODONE HCL 15 MG PO TABS: 15.0000 mg | ORAL_TABLET | Freq: Four times a day (QID) | ORAL | 0 refills | Status: DC | PRN

## 2016-11-07 NOTE — Telephone Encounter (Signed)
Last refill 10/07/16. Please advise.

## 2016-11-07 NOTE — Telephone Encounter (Signed)
Pt needs refill on her   oxyCODONE (ROXICODONE) 15 MG immediate release tablet  Thanks teri

## 2016-11-11 IMAGING — CT CT CERVICAL SPINE W/O CM
3 of 6 series · 13 of 33 positions shown, 16 images · non-contrast
Comparison: Cervical spine CT scan 07/05/2014.

CLINICAL DATA: Neck pain with right arm and hand weakness. History
of cervical fusion in 2660. Subsequent encounter. No known injury.

EXAM:
CT CERVICAL SPINE WITHOUT CONTRAST
TECHNIQUE: Multidetector CT imaging of the cervical spine was performed without
intravenous contrast. Multiplanar CT image reconstructions were also
generated.

[Series 201: cor 2 · coronal · 0.40mm/px · 1 of 57 slices shown]
[im 29/57  bone]
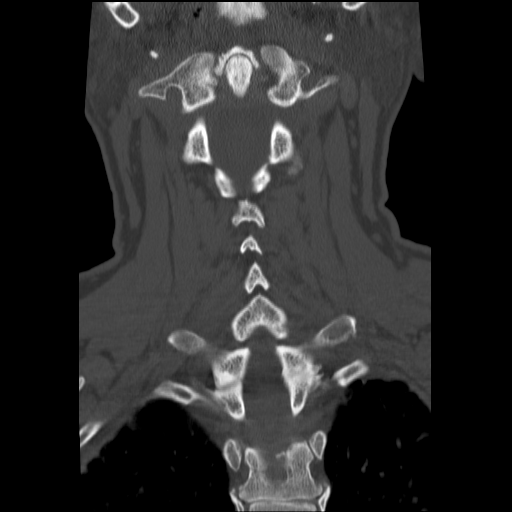

[Series 202: sag · sagittal · 0.40mm/px · 5 of 57 slices shown, 6 images]
[im 19/57  bone]
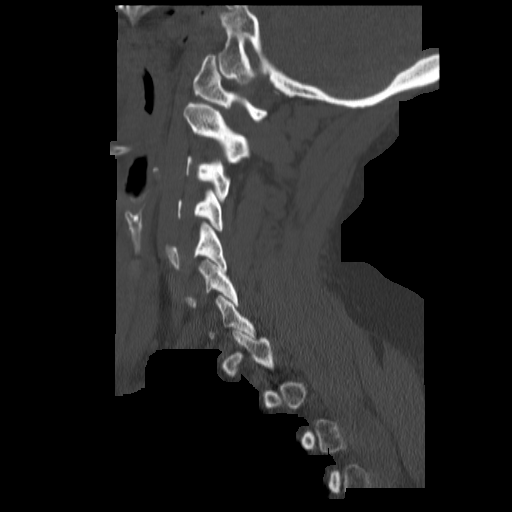
[im 24/57  bone]
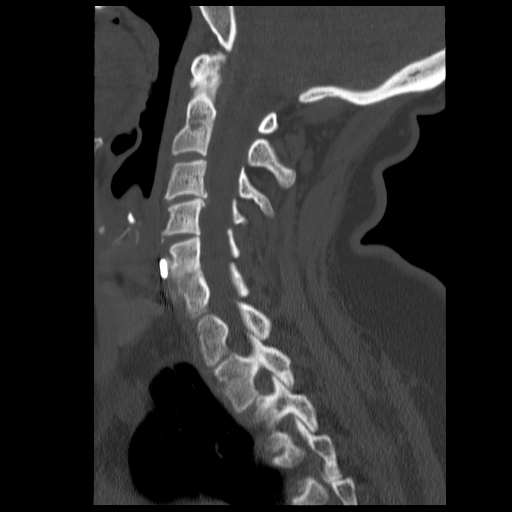
[im 29/57  soft-tissue]
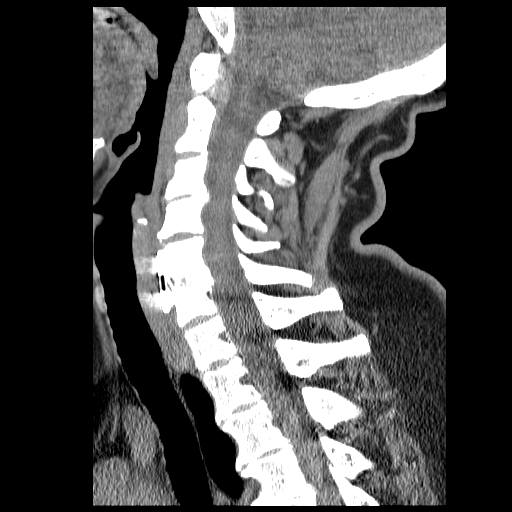
[im 29/57  bone]
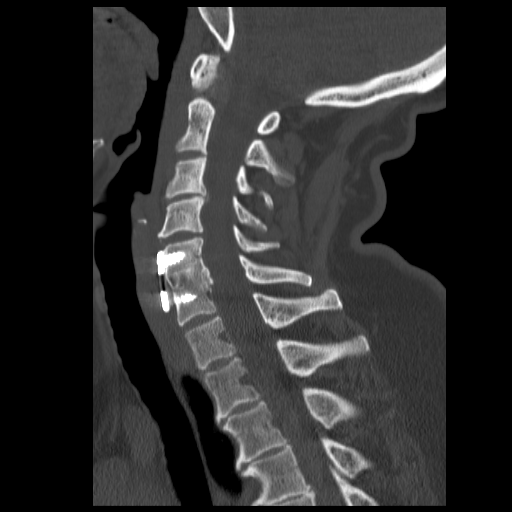
[im 33/57  bone]
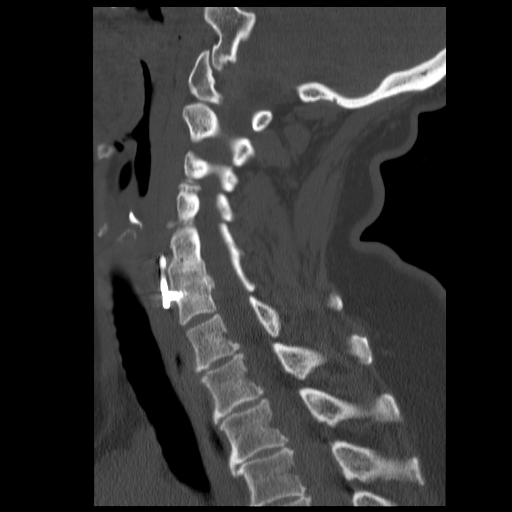
[im 38/57  bone]
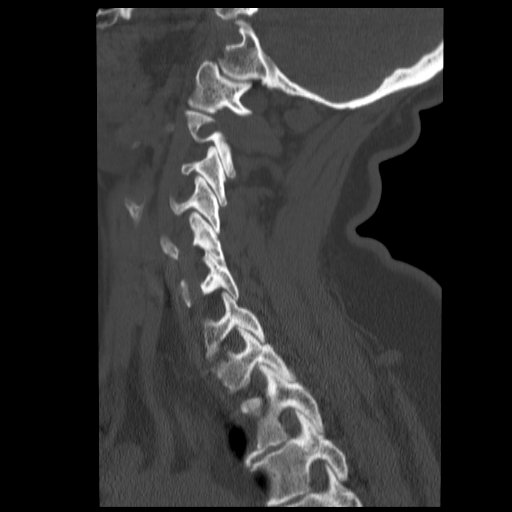

[Series 203: angled axial · axial · 0.20mm/px · z∈[-13,+139]mm · 7 of 292 slices shown, 9 images]
[im 37/292  soft-tissue]
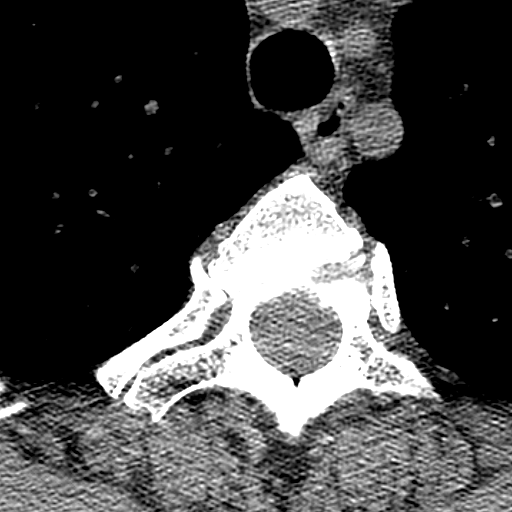
[im 37/292  bone]
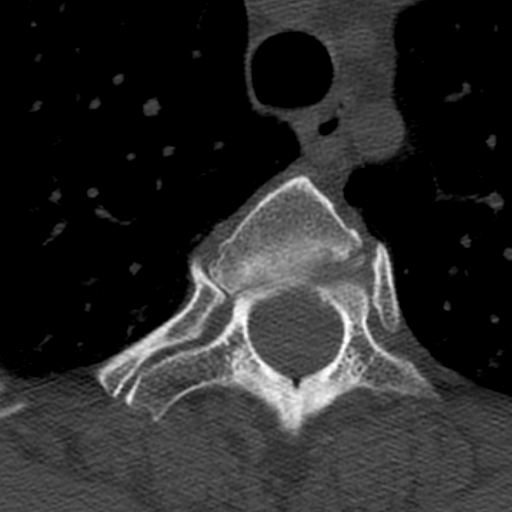
[im 73/292  bone]
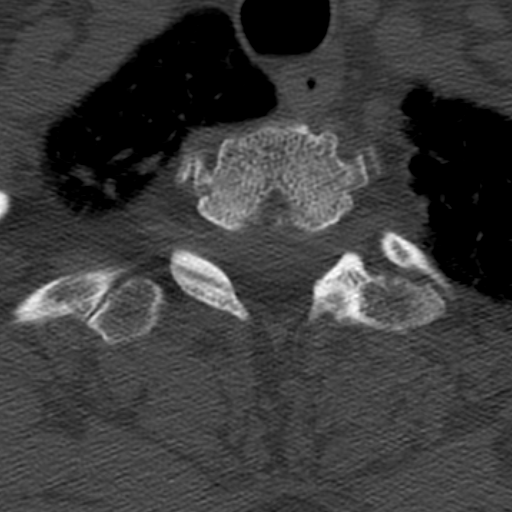
[im 110/292  bone]
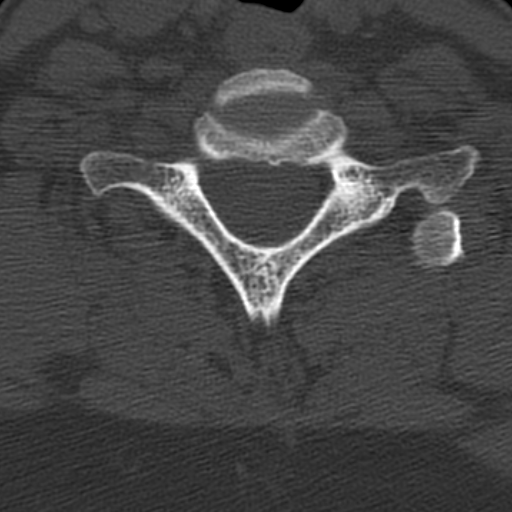
[im 146/292  bone]
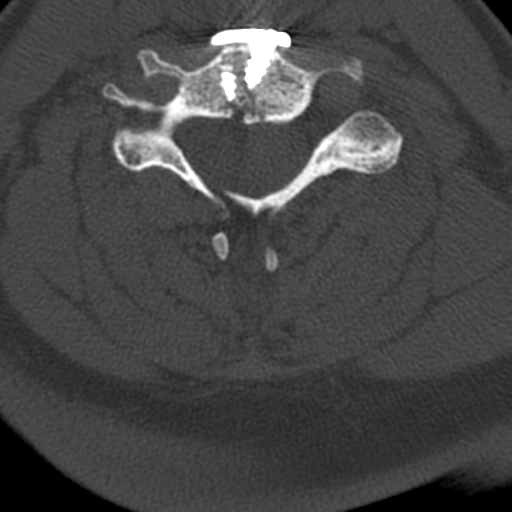
[im 182/292  soft-tissue]
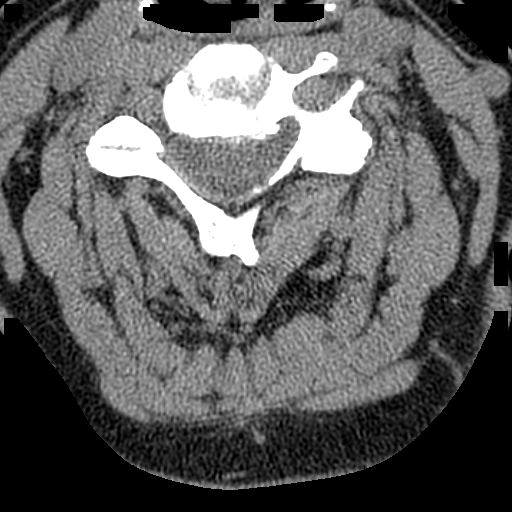
[im 182/292  bone]
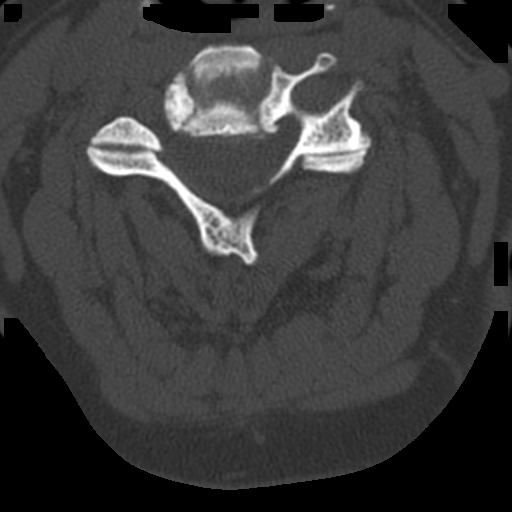
[im 219/292  bone]
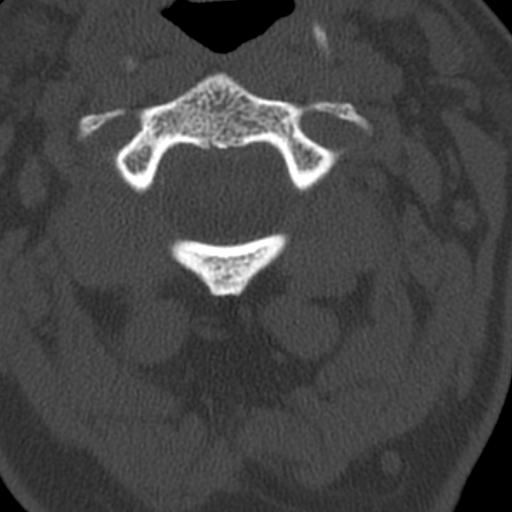
[im 255/292  bone]
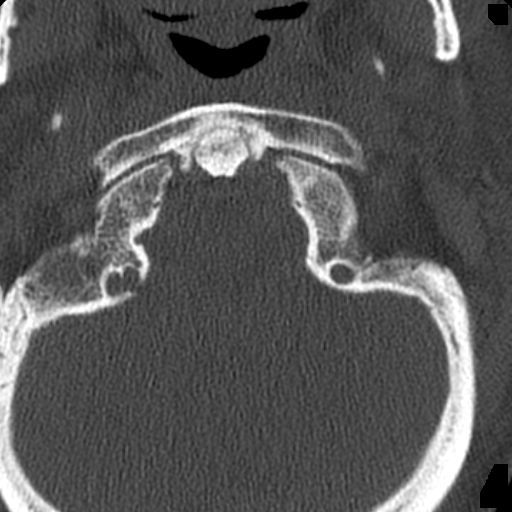

[13 of 33 positions shown; findings below may reference images not displayed]

FINDINGS: Vertebral body height and alignment are maintained. The patient is
status post C5-6 fusion. Hardware is intact. There is solid osseous
fusion across the disc interspace. Paraspinous structures are
unremarkable. Lung apices are clear.

C2-3: No disc bulge or protrusion. Mild facet degenerative disease
on the left is noted. The central canal and foramina are widely
patent.

C3-4: Small disc osteophyte complex is unchanged in appearance. Mild
uncovertebral spurring is seen. The ventral thecal sac is narrowed
but no obvious cord deformity is seen. The foramina are mildly
narrowed. The appearance is unchanged.

C4-5: Shallow disc bulge without central canal or foraminal
stenosis, unchanged.

C5-6: Status post discectomy and fusion. The central canal and
foramina are widely patent.

C6-7:  Negative.

C7-T1: Minimal endplate spur to the left without central canal or
foraminal stenosis.
IMPRESSION: No change in the appearance of the cervical spine since the prior
examination.

Mild spondylosis is most notable at C3-4 where a disc osteophyte
complex narrows the ventral thecal sac and there is mild bilateral
foraminal narrowing. No cord or nerve root compression is
identified.

Widely patent central canal and foramina at C5-6 for the patient is
status post ACDF.

## 2016-11-25 ENCOUNTER — Ambulatory Visit: Payer: Medicare Other | Attending: Otolaryngology

## 2016-11-25 DIAGNOSIS — G4730 Sleep apnea, unspecified: Secondary | ICD-10-CM | POA: Diagnosis not present

## 2016-11-25 DIAGNOSIS — G478 Other sleep disorders: Secondary | ICD-10-CM | POA: Diagnosis not present

## 2016-11-25 DIAGNOSIS — R0683 Snoring: Secondary | ICD-10-CM | POA: Insufficient documentation

## 2016-11-25 DIAGNOSIS — G2581 Restless legs syndrome: Secondary | ICD-10-CM | POA: Insufficient documentation

## 2016-11-28 ENCOUNTER — Telehealth: Payer: Self-pay | Admitting: Family Medicine

## 2016-11-28 NOTE — Telephone Encounter (Signed)
Please advise that sleep study shows restless leg syndrome, but no sign of sleep apnea. She may want to tray something like Requip for restless legs, if she wants to try this then start 0.5 mg hs x 1 week, then increase to 2 hs, #30, rf x 1 and follow up in 3 weeks.

## 2016-11-29 MED ORDER — ROPINIROLE HCL 0.5 MG PO TABS: ORAL_TABLET | ORAL | 1 refills | Status: DC

## 2016-11-29 NOTE — Telephone Encounter (Signed)
Patient was notified of results. Expressed understanding. Rx sent to pharmacy. 

## 2016-12-08 DIAGNOSIS — H16223 Keratoconjunctivitis sicca, not specified as Sjogren's, bilateral: Secondary | ICD-10-CM | POA: Diagnosis not present

## 2016-12-08 DIAGNOSIS — H25013 Cortical age-related cataract, bilateral: Secondary | ICD-10-CM | POA: Diagnosis not present

## 2016-12-08 DIAGNOSIS — H04123 Dry eye syndrome of bilateral lacrimal glands: Secondary | ICD-10-CM | POA: Diagnosis not present

## 2016-12-08 DIAGNOSIS — H524 Presbyopia: Secondary | ICD-10-CM | POA: Diagnosis not present

## 2016-12-08 DIAGNOSIS — H52223 Regular astigmatism, bilateral: Secondary | ICD-10-CM | POA: Diagnosis not present

## 2016-12-09 ENCOUNTER — Telehealth: Payer: Self-pay | Admitting: Family Medicine

## 2016-12-09 MED ORDER — OXYCODONE HCL 15 MG PO TABS: 15.0000 mg | ORAL_TABLET | Freq: Four times a day (QID) | ORAL | 0 refills | Status: DC | PRN

## 2016-12-09 NOTE — Telephone Encounter (Signed)
Patient needs refill of Oxycodone please

## 2016-12-15 ENCOUNTER — Encounter: Payer: Self-pay | Admitting: Family Medicine

## 2016-12-31 ENCOUNTER — Encounter: Payer: Self-pay | Admitting: Obstetrics and Gynecology

## 2016-12-31 ENCOUNTER — Ambulatory Visit (INDEPENDENT_AMBULATORY_CARE_PROVIDER_SITE_OTHER): Payer: Medicare Other | Admitting: Obstetrics and Gynecology

## 2016-12-31 VITALS — Ht 68.0 in

## 2016-12-31 DIAGNOSIS — N898 Other specified noninflammatory disorders of vagina: Secondary | ICD-10-CM | POA: Diagnosis not present

## 2016-12-31 DIAGNOSIS — R102 Pelvic and perineal pain: Secondary | ICD-10-CM | POA: Diagnosis not present

## 2016-12-31 NOTE — Patient Instructions (Signed)
1. Return in 4 weeks for follow-up 2. Removal of vaginal granulation tissue is performed today. Pathology will be made available   Vaginal Biopsy, Care After Refer to this sheet in the next few weeks. These instructions provide you with information about caring for yourself after your procedure. Your health care provider may also give you more specific instructions. Your treatment has been planned according to current medical practices, but problems sometimes occur. Call your health care provider if you have any problems or questions after your procedure. What can I expect after the procedure? After the procedure, it is common to have:  Cramping or mild pain for a few days.  Slight bleeding from the vagina for a few days.  Dark-colored vaginal discharge for a few days.  Follow these instructions at home:  Take over-the-counter and prescription medicines only as told by your health care provider.  Return to your normal activities as told by your health care provider. Ask your health care provider what activities are safe for you.  Use a sanitary napkin until bleeding and discharge stop.  Do not use tampons until your health care provider approves.  Do not douche until your health care provider approves.  Do not have sex until your health care provider approves.  Keep all follow-up visits as told by your health care provider. This is important. Contact a health care provider if:  You have a fever or chills.  You have bad-smelling vaginal discharge.  You have itching or irritation around the vagina.  You have lower abdominal pain. Get help right away if:  You develop heavy vaginal bleeding that soaks more than one sanitary pad an hour.  You faint.  You have very bad lower abdominal pain. This information is not intended to replace advice given to you by your health care provider. Make sure you discuss any questions you have with your health care provider. Document  Released: 02/28/2015 Document Revised: 11/15/2015 Document Reviewed: 10/25/2014 Elsevier Interactive Patient Education  2018 [employer].

## 2016-12-31 NOTE — Progress Notes (Signed)
Chief complaint: 1. Status post posterior colporrhaphy with enterocele ligation 2. Vaginal pain  Patient underwent posterior colporrhaphy with enterocele ligation March 2018. She is doing very well at this time with normal bowel and bladder function. She is not experiencing any vaginal bleeding or discharge. However, she does note a sharp stabbing pain, made worse with intercourse. She denies fevers chills or sweats.  Past medical history, past surgical history, problem list, medications, and allergies are reviewed  OBJECTIVE: Ht 5\' 8"  (1.727 m)  Pleasant female in no acute distress. Alert and oriented. Pelvic exam: External genitalia-normal BUS-normal Vagina-good vault support; good estrogen effect; 5 x 7 mm granulation tissue on right vaginal sidewall is identified and removed with Tischler forceps Monsel solution is applied for hemostasis. Bimanual-deferred due to biopsy  ASSESSMENT: 1. 3 months status post posterior colporrhaphy with enterocele ligation 2. Vaginal granulation tissue, symptomatic  PLAN: 1. Tischler biopsy/removal of granulation tissue. Monsel solution applied 2. Return in 4 weeks for follow-up 3. Post biopsy instructions were given  A total of 15 minutes were spent face-to-face with the patient during this encounter and over half of that time dealt with counseling and coordination of care.  Brayton Mars, MD  Note: This dictation was prepared with Dragon dictation along with smaller phrase technology. Any transcriptional errors that result from this process are unintentional.

## 2017-01-01 ENCOUNTER — Other Ambulatory Visit: Payer: Self-pay | Admitting: Family Medicine

## 2017-01-02 LAB — PATHOLOGY

## 2017-01-05 ENCOUNTER — Other Ambulatory Visit: Payer: Self-pay | Admitting: Obstetrics and Gynecology

## 2017-01-12 ENCOUNTER — Other Ambulatory Visit: Payer: Self-pay | Admitting: Family Medicine

## 2017-01-12 NOTE — Telephone Encounter (Signed)
Pt needs refill on her oxycodone 15 mg  Thanks teri

## 2017-01-12 NOTE — Telephone Encounter (Signed)
Last RF 6/19, LOV 5/9, follow up scheduled for 8/10.

## 2017-01-13 MED ORDER — OXYCODONE HCL 15 MG PO TABS: 15.0000 mg | ORAL_TABLET | Freq: Four times a day (QID) | ORAL | 0 refills | Status: DC | PRN

## 2017-01-23 ENCOUNTER — Other Ambulatory Visit: Payer: Self-pay | Admitting: Family Medicine

## 2017-01-23 NOTE — Telephone Encounter (Signed)
Patient needs refill of Requip please

## 2017-01-23 NOTE — Telephone Encounter (Signed)
LMOVM for pt to return call 

## 2017-01-23 NOTE — Telephone Encounter (Signed)
Need to know if she is taking one or two tablets at bedtime, and if it is working well enough.

## 2017-01-27 ENCOUNTER — Other Ambulatory Visit: Payer: Self-pay | Admitting: Family Medicine

## 2017-01-27 MED ORDER — ROPINIROLE HCL 1 MG PO TABS: 1.0000 mg | ORAL_TABLET | Freq: Every day | ORAL | 6 refills | Status: DC

## 2017-01-27 NOTE — Telephone Encounter (Signed)
Ok, have changed to 1mg  tablets so she only have to take one tablet at bedtime.

## 2017-01-27 NOTE — Telephone Encounter (Signed)
Patient stated that she is taking 2 tablets at bedtime. Also she feels that Requip is working pretty well.

## 2017-01-27 NOTE — Telephone Encounter (Signed)
Patient was notified.

## 2017-01-30 ENCOUNTER — Ambulatory Visit (INDEPENDENT_AMBULATORY_CARE_PROVIDER_SITE_OTHER): Payer: Medicare Other | Admitting: Family Medicine

## 2017-01-30 ENCOUNTER — Encounter: Payer: Self-pay | Admitting: Family Medicine

## 2017-01-30 VITALS — BP 120/70 | HR 103 | Temp 98.2°F | Resp 16 | Ht 68.0 in | Wt 213.0 lb

## 2017-01-30 DIAGNOSIS — E785 Hyperlipidemia, unspecified: Secondary | ICD-10-CM

## 2017-01-30 DIAGNOSIS — I10 Essential (primary) hypertension: Secondary | ICD-10-CM

## 2017-01-30 DIAGNOSIS — R5383 Other fatigue: Secondary | ICD-10-CM | POA: Diagnosis not present

## 2017-01-30 DIAGNOSIS — M25542 Pain in joints of left hand: Secondary | ICD-10-CM | POA: Diagnosis not present

## 2017-01-30 DIAGNOSIS — M25541 Pain in joints of right hand: Secondary | ICD-10-CM | POA: Diagnosis not present

## 2017-01-30 MED ORDER — CYANOCOBALAMIN 1000 MCG/ML IJ SOLN
1000.0000 ug | Freq: Once | INTRAMUSCULAR | Status: AC
  Administered 2017-01-30: 1000 ug via INTRAMUSCULAR

## 2017-01-30 NOTE — Progress Notes (Signed)
Patient: Courtney Ross Female    DOB: [birth year]   61 y.o.   MRN: [id number] Visit Date: 01/30/2017  Today's Provider: Lelon Huh, MD   Chief Complaint  Patient presents with  . Follow-up  . Hypertension   Subjective:    HPI   Hypertension, follow-up:  BP Readings from Last 3 Encounters:  01/30/17 120/70  10/29/16 130/82  10/08/16 (!) 142/84    She was last seen for hypertension 3 months ago.  BP at that visit was 130/82. She had stopped her blood pressure medications due to improvements in diet.  She reports good compliance with treatment. She is not having side effects. none She is exercising. She is adherent to low salt diet.   Outside blood pressures are 130/90. She is experiencing none.  Patient denies none.   Cardiovascular risk factors include none.  Use of agents associated with hypertension: none.   Wt Readings from Last 3 Encounters:  01/30/17 213 lb (96.6 kg)  10/29/16 213 lb (96.6 kg)  10/08/16 210 lb (95.3 kg)    ----------------------------------------------------------------   Restless Leg Syndrome From 11/28/2016-Started Requip, she reports this has helped quite a bit and is tolerating well. She is interested in trying vibration pads (Relaxis) to help with RLS.   She also reports persistent joint pains and mild swelling in fingers of both hands. She reports family history of rheumatoid arthritis and requests testing.    Allergies  Allergen Reactions  . Hydrocodone Itching  . Lisinopril Cough  . Mirtazapine     Weight gain     Current Outpatient Prescriptions:  .  aspirin EC 81 MG tablet, Take 81 mg by mouth at bedtime., Disp: , Rfl:  .  buPROPion (WELLBUTRIN SR) 150 MG 12 hr tablet, Take 1 tablet (150 mg total) by mouth 2 (two) times daily., Disp: 60 tablet, Rfl: 6 .  Cholecalciferol (VITAMIN D3) 5000 UNITS CAPS, Take 5,000 Units by mouth daily. , Disp: , Rfl:  .  Cyanocobalamin (VITAMIN B-12) 500 MCG SUBL, Place 500 mcg under  the tongue daily., Disp: , Rfl:  .  cyclobenzaprine (FLEXERIL) 10 MG tablet, Take 10 mg by mouth 3 (three) times daily as needed for muscle spasms., Disp: , Rfl:  .  desvenlafaxine (PRISTIQ) 100 MG 24 hr tablet, TAKE 1 TABLET EVERY DAY, Disp: 30 tablet, Rfl: 12 .  diazepam (VALIUM) 10 MG tablet, TAKE 1 TABLET BY MOUTH 4 TIMES DAILY, Disp: 120 tablet, Rfl: 5 .  docusate sodium (COLACE) 100 MG capsule, Take 1 capsule (100 mg total) by mouth 2 (two) times daily., Disp: 10 capsule, Rfl: 0 .  estradiol (ESTRACE) 1 MG tablet, TAKE 1 TABLET BY MOUTH DAILY, Disp: 90 tablet, Rfl: 4 .  ibuprofen (ADVIL,MOTRIN) 800 MG tablet, Take 1 tablet (800 mg total) by mouth 3 (three) times daily., Disp: 50 tablet, Rfl: 1 .  LINZESS 145 MCG CAPS capsule, TAKE ONE CAPSULE DAILY, Disp: 30 capsule, Rfl: 12 .  LYRICA 75 MG capsule, TAKE 1 CAPSULE BY MOUTH 3 TIMES DAILY, Disp: 90 capsule, Rfl: 5 .  meloxicam (MOBIC) 15 MG tablet, Take 15 mg by mouth daily. , Disp: , Rfl:  .  Multiple Vitamin (MULTIVITAMIN WITH MINERALS) TABS tablet, Take 1 tablet by mouth daily., Disp: , Rfl:  .  mupirocin cream (BACTROBAN) 2 %, Apply 1 application topically 2 (two) times daily., Disp: 15 g, Rfl: 1 .  MYRBETRIQ 25 MG TB24 tablet, TAKE 1 TABLET BY MOUTH DAILY, Disp:  30 tablet, Rfl: 6 .  oxyCODONE (ROXICODONE) 15 MG immediate release tablet, Take 1-2 tablets (15-30 mg total) by mouth 4 (four) times daily as needed., Disp: 180 tablet, Rfl: 0 .  polyethylene glycol powder (GLYCOLAX/MIRALAX) powder, Take 17 g by mouth 2 (two) times daily as needed. (Patient taking differently: Take 17 g by mouth 2 (two) times daily as needed (FOR CONSTIPATION). ), Disp: 3350 g, Rfl: 2 .  pravastatin (PRAVACHOL) 40 MG tablet, TAKE 1 TABLET BY MOUTH DAILY (Patient taking differently: TAKE 1 TABLET BY MOUTH DAILY AT NIGHT), Disp: 30 tablet, Rfl: 12 .  RESTASIS 0.05 % ophthalmic emulsion, Apply 1 drop to eye 2 (two) times daily., Disp: , Rfl:  .  rOPINIRole (REQUIP) 1  MG tablet, Take 1 tablet (1 mg total) by mouth at bedtime., Disp: 30 tablet, Rfl: 6  Review of Systems  Constitutional: Negative for appetite change, chills, fatigue and fever.  Respiratory: Negative for chest tightness and shortness of breath.   Cardiovascular: Negative for chest pain and palpitations.  Gastrointestinal: Negative for abdominal pain, nausea and vomiting.  Neurological: Negative for dizziness and weakness.    Social History  Substance Use Topics  . Smoking status: Never Smoker  . Smokeless tobacco: Never Used  . Alcohol use 0.0 oz/week     Comment: rare 1-2 drinks per year   Objective:   BP 120/70 (BP Location: Right Arm, Patient Position: Sitting, Cuff Size: Large)   Pulse (!) 103   Temp 98.2 F (36.8 C) (Oral)   Resp 16   Ht '5\' 8"'$  (1.727 m)   Wt 213 lb (96.6 kg)   SpO2 97%   BMI 32.39 kg/m  Vitals:   01/30/17 1446  BP: 120/70  Pulse: (!) 103  Resp: 16  Temp: 98.2 F (36.8 C)  TempSrc: Oral  SpO2: 97%  Weight: 213 lb (96.6 kg)  Height: '5\' 8"'$  (1.727 m)     Physical Exam   General Appearance:    Alert, cooperative, no distress  Eyes:    PERRL, conjunctiva/corneas clear, EOM's intact       Lungs:     Clear to auscultation bilaterally, respirations unlabored  Heart:    Regular rate and rhythm  Neurologic:   Awake, alert, oriented x 3. No apparent focal neurological           defect.   MS:   Slight welling of Ips of both hands no erythema.        Assessment & Plan:     1. Essential hypertension Well controlled with dietary changes since stopping medications.   2. Arthralgia of both hands  - Rheumatoid Factor - Sed Rate (ESR)  3. Dyslipidemia .She is tolerating pravastatin well with no adverse effects.   - Lipid panel - Comprehensive metabolic panel  4. Fatigue, unspecified type She states B12 injections have been helpful in the past and requests one today.  - cyanocobalamin ((VITAMIN B-12)) injection 1,000 mcg; Inject 1 mL (1,000 mcg  total) into the muscle once.     The entirety of the information documented in the History of Present Illness, Review of Systems and Physical Exam were personally obtained by me. Portions of this information were initially documented by April M. Sabra Heck, CMA and reviewed by me for thoroughness and accuracy.    Lelon Huh, MD  [facility]

## 2017-01-31 LAB — LIPID PANEL
Chol/HDL Ratio: 3.7 ratio (ref 0.0–4.4)
Cholesterol, Total: 227 mg/dL — ABNORMAL HIGH (ref 100–199)
HDL: 61 mg/dL (ref >39)
LDL Calculated: 86 mg/dL (ref 0–99)
Triglycerides: 398 mg/dL — ABNORMAL HIGH (ref 0–149)
VLDL Cholesterol Cal: 80 mg/dL — ABNORMAL HIGH (ref 5–40)

## 2017-01-31 LAB — COMPREHENSIVE METABOLIC PANEL
ALT: 18 IU/L (ref 0–32)
AST: 18 IU/L (ref 0–40)
Albumin/Globulin Ratio: 1.5 (ref 1.2–2.2)
Albumin: 4 g/dL (ref 3.6–4.8)
Alkaline Phosphatase: 110 IU/L (ref 39–117)
BUN/Creatinine Ratio: 18 (ref 12–28)
BUN: 15 mg/dL (ref 8–27)
Bilirubin Total: 0.3 mg/dL (ref 0.0–1.2)
CO2: 24 mmol/L (ref 20–29)
Calcium: 9.1 mg/dL (ref 8.7–10.3)
Chloride: 101 mmol/L (ref 96–106)
Creatinine, Ser: 0.82 mg/dL (ref 0.57–1.00)
GFR calc Af Amer: 89 mL/min/{1.73_m2} (ref >59)
GFR calc non Af Amer: 77 mL/min/{1.73_m2} (ref >59)
Globulin, Total: 2.6 g/dL (ref 1.5–4.5)
Glucose: 95 mg/dL (ref 65–99)
Potassium: 4.3 mmol/L (ref 3.5–5.2)
Sodium: 139 mmol/L (ref 134–144)
Total Protein: 6.6 g/dL (ref 6.0–8.5)

## 2017-01-31 LAB — SEDIMENTATION RATE: Sed Rate: 28 mm/hr (ref 0–40)

## 2017-01-31 LAB — RHEUMATOID FACTOR: Rhuematoid fact SerPl-aCnc: <10 IU/mL (ref 0.0–13.9)

## 2017-02-02 ENCOUNTER — Ambulatory Visit (INDEPENDENT_AMBULATORY_CARE_PROVIDER_SITE_OTHER): Payer: Medicare Other | Admitting: Obstetrics and Gynecology

## 2017-02-02 VITALS — BP 146/83 | HR 111 | Ht 68.0 in | Wt 209.8 lb

## 2017-02-02 DIAGNOSIS — R102 Pelvic and perineal pain: Secondary | ICD-10-CM | POA: Diagnosis not present

## 2017-02-02 DIAGNOSIS — N898 Other specified noninflammatory disorders of vagina: Secondary | ICD-10-CM

## 2017-02-02 DIAGNOSIS — Z09 Encounter for follow-up examination after completed treatment for conditions other than malignant neoplasm: Secondary | ICD-10-CM

## 2017-02-02 NOTE — Patient Instructions (Signed)
1. Return in 10 months for follow-up 2. Return as needed if vaginal pain persists or worsens 3. Resume all activities without restriction

## 2017-02-02 NOTE — Progress Notes (Signed)
Chief complaint: 1. Granulation tissue 2. Vaginal pain 3. Status post posterior colporrhaphy with enterocele ligation  The patient presents for follow-up after vaginal wall biopsy last month for residual granulation tissue following surgery. She has not experienced any further spotting or bleeding. She does have some residual right lower quadrant/vaginal pain. No UTI symptoms.  Pathology from biopsy-granulation tissue  Past medical history, past surgical history, problem list, medications, and allergies are reviewed  BP (!) 146/83   Pulse (!) 111   Ht 5\' 8"  (1.727 m)   Wt 209 lb 12.8 oz (95.2 kg)   BMI 31.90 kg/m  Pleasant well-appearing emailing acute distress. Alert and oriented. Pelvic exam: External genitalia-normal BUS-normal Vagina-good vault support; good estrogen effect; no evidence of residual granulation tissue. Bimanual-no palpable masses, nodularity, or tenderness   ASSESSMENT: 1. Granulation tissue of vagina, resolved 2. 4 months has post posterior colporrhaphy with enterocele ligation; normal bowel and bladder function 3. Residual right lower quadrant/vaginal pain, nonacute, unclear etiology  PLAN: 1. Resume all activities without restriction 2. Return as needed if vaginal pain symptoms persist or worsen 3. Return in 10 months for follow-up   A total of 15 minutes were spent face-to-face with the patient during this encounter and over half of that time dealt with counseling and coordination of care.  Brayton Mars, MD  Note: This dictation was prepared with Dragon dictation along with smaller phrase technology. Any transcriptional errors that result from this process are unintentional.

## 2017-02-16 ENCOUNTER — Other Ambulatory Visit: Payer: Self-pay | Admitting: Family Medicine

## 2017-02-16 MED ORDER — OXYCODONE HCL 15 MG PO TABS: 15.0000 mg | ORAL_TABLET | Freq: Four times a day (QID) | ORAL | 0 refills | Status: DC | PRN

## 2017-02-16 NOTE — Telephone Encounter (Signed)
Pt contacted office for refill request on the following medications:  oxyCODONE (ROXICODONE) 15 MG immediate release tablet  CB#[phone]/MW

## 2017-03-12 ENCOUNTER — Other Ambulatory Visit: Payer: Self-pay | Admitting: Family Medicine

## 2017-03-12 NOTE — Telephone Encounter (Signed)
RX called in at [facility]

## 2017-03-12 NOTE — Telephone Encounter (Signed)
Please call in diazepam.  

## 2017-03-17 ENCOUNTER — Other Ambulatory Visit: Payer: Self-pay | Admitting: Family Medicine

## 2017-03-17 DIAGNOSIS — M47816 Spondylosis without myelopathy or radiculopathy, lumbar region: Secondary | ICD-10-CM | POA: Diagnosis not present

## 2017-03-17 DIAGNOSIS — M47812 Spondylosis without myelopathy or radiculopathy, cervical region: Secondary | ICD-10-CM | POA: Diagnosis not present

## 2017-03-17 MED ORDER — OXYCODONE HCL 15 MG PO TABS: 15.0000 mg | ORAL_TABLET | Freq: Four times a day (QID) | ORAL | 0 refills | Status: DC | PRN

## 2017-03-17 NOTE — Telephone Encounter (Signed)
Pt contacted office for refill request on the following medications:  oxyCODONE (ROXICODONE) 15 MG immediate  release tablet   CB#[phone]/MW

## 2017-03-19 DIAGNOSIS — Z23 Encounter for immunization: Secondary | ICD-10-CM | POA: Diagnosis not present

## 2017-03-23 ENCOUNTER — Other Ambulatory Visit: Payer: Self-pay | Admitting: Family Medicine

## 2017-03-23 MED ORDER — LINACLOTIDE 145 MCG PO CAPS
145.0000 ug | ORAL_CAPSULE | Freq: Every day | ORAL | 12 refills | Status: DC
Start: 2017-03-23 — End: 2018-04-13

## 2017-03-23 NOTE — Telephone Encounter (Signed)
[facility] faxed a refill request on the following medications:  LINZESS 145 MCG CAPS capsule.  Take 1 capsule by mouth daily.  [facility]

## 2017-03-27 ENCOUNTER — Encounter: Payer: Self-pay | Admitting: Family Medicine

## 2017-03-27 ENCOUNTER — Ambulatory Visit (INDEPENDENT_AMBULATORY_CARE_PROVIDER_SITE_OTHER): Payer: Medicare Other | Admitting: Family Medicine

## 2017-03-27 VITALS — BP 118/80 | HR 100 | Temp 98.0°F | Resp 16 | Wt 213.0 lb

## 2017-03-27 DIAGNOSIS — R030 Elevated blood-pressure reading, without diagnosis of hypertension: Secondary | ICD-10-CM

## 2017-03-27 DIAGNOSIS — Z6831 Body mass index (BMI) 31.0-31.9, adult: Secondary | ICD-10-CM | POA: Diagnosis not present

## 2017-03-27 DIAGNOSIS — E669 Obesity, unspecified: Secondary | ICD-10-CM

## 2017-03-27 DIAGNOSIS — R5383 Other fatigue: Secondary | ICD-10-CM

## 2017-03-27 MED ORDER — TOPIRAMATE 50 MG PO TABS: 50.0000 mg | ORAL_TABLET | Freq: Every day | ORAL | 5 refills | Status: DC

## 2017-03-27 MED ORDER — PHENTERMINE HCL 15 MG PO CAPS: 15.0000 mg | ORAL_CAPSULE | ORAL | 5 refills | Status: DC

## 2017-03-27 MED ORDER — CYANOCOBALAMIN 1000 MCG/ML IJ SOLN
1000.0000 ug | Freq: Once | INTRAMUSCULAR | Status: AC
  Administered 2017-03-27: 1000 ug via INTRAMUSCULAR

## 2017-03-27 NOTE — Progress Notes (Signed)
Patient: Courtney Ross Female    DOB: 03/04/1956   61 y.o.   MRN: [id number] Visit Date: 03/27/2017  Today's Provider: Lelon Huh, MD   Chief Complaint  Patient presents with  . Hypertension   Subjective:    HPI  Patient is here concerning elevated blood pressure for 147/96. Was feeling jittery earlier this week and noted to have elevated BP 2 and 3 days. Denies any OTC medications, decongestants or stimulants. No dietary changes. Not feeling more stressed. She feels back to her baseline today    Allergies  Allergen Reactions  . Hydrocodone Itching  . Lisinopril Cough  . Mirtazapine     Weight gain   BP Readings from Last 3 Encounters:  03/27/17 118/80  02/02/17 (!) 146/83  01/30/17 120/70   She also requests refill from topiramate and low does phentermine to help loose weight. She took these in the past and reports they were effective.    Current Outpatient Prescriptions:  .  aspirin EC 81 MG tablet, Take 81 mg by mouth at bedtime., Disp: , Rfl:  .  buPROPion (WELLBUTRIN SR) 150 MG 12 hr tablet, Take 1 tablet (150 mg total) by mouth 2 (two) times daily., Disp: 60 tablet, Rfl: 6 .  Cholecalciferol (VITAMIN D3) 5000 UNITS CAPS, Take 5,000 Units by mouth daily. , Disp: , Rfl:  .  Cyanocobalamin (VITAMIN B-12) 500 MCG SUBL, Place 500 mcg under the tongue daily., Disp: , Rfl:  .  cyclobenzaprine (FLEXERIL) 10 MG tablet, Take 10 mg by mouth 3 (three) times daily as needed for muscle spasms., Disp: , Rfl:  .  desvenlafaxine (PRISTIQ) 100 MG 24 hr tablet, TAKE 1 TABLET EVERY DAY, Disp: 30 tablet, Rfl: 12 .  diazepam (VALIUM) 10 MG tablet, TAKE ONE TABLET BY MOUTH FOUR TIMES A DAY, Disp: 120 tablet, Rfl: 3 .  estradiol (ESTRACE) 1 MG tablet, TAKE 1 TABLET BY MOUTH DAILY, Disp: 90 tablet, Rfl: 4 .  linaclotide (LINZESS) 145 MCG CAPS capsule, Take 1 capsule (145 mcg total) by mouth daily., Disp: 30 capsule, Rfl: 12 .  LYRICA 75 MG capsule, TAKE 1 CAPSULE BY MOUTH 3  TIMES DAILY, Disp: 90 capsule, Rfl: 5 .  meloxicam (MOBIC) 15 MG tablet, Take 15 mg by mouth daily. , Disp: , Rfl:  .  Multiple Vitamin (MULTIVITAMIN WITH MINERALS) TABS tablet, Take 1 tablet by mouth daily., Disp: , Rfl:  .  mupirocin cream (BACTROBAN) 2 %, Apply 1 application topically 2 (two) times daily., Disp: 15 g, Rfl: 1 .  MYRBETRIQ 25 MG TB24 tablet, TAKE 1 TABLET BY MOUTH DAILY, Disp: 30 tablet, Rfl: 6 .  oxyCODONE (ROXICODONE) 15 MG immediate release tablet, Take 1-2 tablets (15-30 mg total) by mouth 4 (four) times daily as needed., Disp: 180 tablet, Rfl: 0 .  polyethylene glycol powder (GLYCOLAX/MIRALAX) powder, Take 17 g by mouth 2 (two) times daily as needed. (Patient taking differently: Take 17 g by mouth 2 (two) times daily as needed (FOR CONSTIPATION). ), Disp: 3350 g, Rfl: 2 .  pravastatin (PRAVACHOL) 40 MG tablet, TAKE 1 TABLET BY MOUTH DAILY, Disp: 30 tablet, Rfl: 11 .  RESTASIS 0.05 % ophthalmic emulsion, Apply 1 drop to eye 2 (two) times daily., Disp: , Rfl:  .  rOPINIRole (REQUIP) 1 MG tablet, Take 1 tablet (1 mg total) by mouth at bedtime., Disp: 30 tablet, Rfl: 6  Review of Systems  Constitutional: Negative for appetite change, chills, fatigue and fever.  Respiratory:  Negative for chest tightness and shortness of breath.   Cardiovascular: Negative for chest pain and palpitations.  Gastrointestinal: Negative for abdominal pain, nausea and vomiting.  Neurological: Negative for dizziness and weakness.    Social History  Substance Use Topics  . Smoking status: Never Smoker  . Smokeless tobacco: Never Used  . Alcohol use 0.0 oz/week     Comment: rare 1-2 drinks per year   Objective:   BP 118/80 (BP Location: Right Arm, Patient Position: Sitting, Cuff Size: Large)   Pulse 100   Temp 98 F (36.7 C) (Oral)   Resp 16   Wt 213 lb (96.6 kg)   SpO2 95%   BMI 32.39 kg/m  Vitals:   03/27/17 1145  BP: 118/80  Pulse: 100  Resp: 16  Temp: 98 F (36.7 C)  TempSrc:  Oral  SpO2: 95%  Weight: 213 lb (96.6 kg)     Physical Exam   General Appearance:    Alert, cooperative, no distress  Eyes:    PERRL, conjunctiva/corneas clear, EOM's intact       Lungs:     Clear to auscultation bilaterally, respirations unlabored  Heart:    Regular rate and rhythm  Neurologic:   Awake, alert, oriented x 3. No apparent focal neurological           defect.          Assessment & Plan:     1. Elevated blood pressure reading Home BP elevations as per HPI, which have normalized today. Counseled regarding low sodium diet. And call if home BP consistently elevated.   2. Class 1 obesity without serious comorbidity with body mass index (BMI) of 31.0 to 31.9 in adult, unspecified obesity type Restart - phentermine 15 MG capsule; Take 1 capsule (15 mg total) by mouth every morning.  Dispense: 30 capsule; Refill: 5 - topiramate (TOPAMAX) 50 MG tablet; Take 1 tablet (50 mg total) by mouth daily.  Dispense: 30 tablet; Refill: 5  3. Fatigue, unspecified type  - cyanocobalamin ((VITAMIN B-12)) injection 1,000 mcg; Inject 1 mL (1,000 mcg total) into the muscle once.       Lelon Huh, MD  [facility]

## 2017-04-20 ENCOUNTER — Other Ambulatory Visit: Payer: Self-pay | Admitting: Family Medicine

## 2017-04-20 MED ORDER — OXYCODONE HCL 15 MG PO TABS: 15.0000 mg | ORAL_TABLET | Freq: Four times a day (QID) | ORAL | 0 refills | Status: DC | PRN

## 2017-04-20 NOTE — Telephone Encounter (Signed)
Pt contacted office for refill request on the following medications:  oxyCODONE (ROXICODONE) 15 MG immediate release tablet  Last Rx: 03/17/17 LOV: 03/27/17  Please advise. Thanks TNP

## 2017-04-28 ENCOUNTER — Other Ambulatory Visit: Payer: Self-pay | Admitting: Family Medicine

## 2017-04-28 DIAGNOSIS — F324 Major depressive disorder, single episode, in partial remission: Secondary | ICD-10-CM

## 2017-04-29 ENCOUNTER — Other Ambulatory Visit: Payer: Self-pay

## 2017-04-29 MED ORDER — PREGABALIN 75 MG PO CAPS: 75.0000 mg | ORAL_CAPSULE | Freq: Three times a day (TID) | ORAL | 5 refills | Status: DC

## 2017-04-29 NOTE — Telephone Encounter (Signed)
Please call in Lyrica

## 2017-04-29 NOTE — Telephone Encounter (Signed)
Rx called in to pharmacy. 

## 2017-04-29 NOTE — Telephone Encounter (Signed)
Pharmacy requesting refills. Thanks!  

## 2017-05-11 ENCOUNTER — Other Ambulatory Visit: Payer: Self-pay | Admitting: Family Medicine

## 2017-05-11 DIAGNOSIS — Z1231 Encounter for screening mammogram for malignant neoplasm of breast: Secondary | ICD-10-CM

## 2017-05-25 ENCOUNTER — Telehealth: Payer: Self-pay | Admitting: Family Medicine

## 2017-05-25 NOTE — Telephone Encounter (Signed)
Pt contacted office for refill request on the following medications:  oxyCODONE (ROXICODONE) 15 MG immediate release tablet  [place]  Last Rx: 04/20/17 LOV: 03/27/17  Please advise. Thanks TNP

## 2017-05-26 MED ORDER — OXYCODONE HCL 15 MG PO TABS: 15.0000 mg | ORAL_TABLET | Freq: Four times a day (QID) | ORAL | 0 refills | Status: DC | PRN

## 2017-05-26 NOTE — Telephone Encounter (Signed)
Please advise patient that oxycodone prescription has been sent electronically to   [facility], [place] [facility] [address] [place] [address] Phone: [phone] Fax: [phone]

## 2017-05-26 NOTE — Telephone Encounter (Signed)
Advised patient as below.  

## 2017-06-04 ENCOUNTER — Ambulatory Visit
Admission: RE | Admit: 2017-06-04 | Discharge: 2017-06-04 | Disposition: A | Discharge status: Home / Self Care | Payer: Medicare Other | Source: Ambulatory Visit | Attending: Family Medicine | Admitting: Family Medicine

## 2017-06-04 DIAGNOSIS — Z1231 Encounter for screening mammogram for malignant neoplasm of breast: Secondary | ICD-10-CM

## 2017-06-09 ENCOUNTER — Ambulatory Visit (INDEPENDENT_AMBULATORY_CARE_PROVIDER_SITE_OTHER): Payer: Medicare Other | Admitting: Family Medicine

## 2017-06-09 ENCOUNTER — Ambulatory Visit (INDEPENDENT_AMBULATORY_CARE_PROVIDER_SITE_OTHER): Payer: Medicare Other

## 2017-06-09 VITALS — BP 124/82 | HR 60 | Temp 98.5°F | Ht 68.0 in | Wt 214.8 lb

## 2017-06-09 DIAGNOSIS — R5383 Other fatigue: Secondary | ICD-10-CM | POA: Diagnosis not present

## 2017-06-09 DIAGNOSIS — Z0000 Encounter for general adult medical examination without abnormal findings: Secondary | ICD-10-CM | POA: Diagnosis not present

## 2017-06-09 MED ORDER — CYANOCOBALAMIN 1000 MCG/ML IJ SOLN
1000.0000 ug | Freq: Once | INTRAMUSCULAR | Status: AC
  Administered 2017-06-09: 1000 ug via INTRAMUSCULAR

## 2017-06-09 NOTE — Progress Notes (Signed)
Subjective:   Courtney Ross is a 61 y.o. female who presents for Medicare Annual (Subsequent) preventive examination.  Review of Systems:  N/A  Cardiac Risk Factors include: advanced age (>41men, >62 women);dyslipidemia;hypertension;obesity (BMI >30kg/m2)     Objective:     Vitals: BP 124/82 (BP Location: Left Arm)   Pulse 60   Temp 98.5 F (36.9 C) (Oral)   Ht 5\' 8"  (1.727 m)   Wt 214 lb 12.8 oz (97.4 kg)   BMI 32.66 kg/m   Body mass index is 32.66 kg/m.  Advanced Directives 06/09/2017 08/25/2016 08/20/2016 07/01/2016 05/30/2016 05/08/2015 03/22/2015  Does Patient Have a Medical Advance Directive? No No No No No No No  Would patient like information on creating a medical advance directive? Yes (Inpatient - patient defers creating a medical advance directive at this time) No - Patient declined - Yes (MAU/Ambulatory/Procedural Areas - Information given) Yes (MAU/Ambulatory/Procedural Areas - Information given) No - patient declined information No - patient declined information    Tobacco Social History   Tobacco Use  Smoking Status Never Smoker  Smokeless Tobacco Never Used     Counseling given: Not Answered   Clinical Intake:  Pre-visit preparation completed: Yes  Pain : No/denies pain Pain Score: 0-No pain     Nutritional Status: BMI > 30  Obese Nutritional Risks: None Diabetes: No  How often do you need to have someone help you when you read instructions, pamphlets, or other written materials from your doctor or pharmacy?: 1 - Never  Interpreter Needed?: No  Information entered by :: [facility], LPN  Past Medical History:  Diagnosis Date  . Anxiety   . Back pain   . Constipation   . Degenerative disc disease, lumbar   . Depression   . Family history of adverse reaction to anesthesia    daughter nausea  . Fatty liver   . GERD (gastroesophageal reflux disease)   . Headache    migraines  . Headache, migraine 05/08/2015  . High cholesterol   .  Hypertension   . Lower back pain   . Polycystic ovarian disease   . PONV (postoperative nausea and vomiting)   . Restless leg syndrome   . Spasm 01/29/2009   Past Surgical History:  Procedure Laterality Date  . [place]  . APPENDECTOMY  1988  . ARTHRODESIS METATARSALPHALANGEAL JOINT (MTPJ) Left 12/14/2014   Procedure: ARTHRODESIS METATARSALPHALANGEAL JOINT (MTPJ) 1ST;  Surgeon: Albertine Patricia, DPM;  Location: [place];  Service: Podiatry;  Laterality: Left;  LMA  . BACK SURGERY  [phone]   x3  . BLADDER SUSPENSION  2006  . BREAST BIOPSY Left 1990's  . BREAST SURGERY  2005   lumpectomy  . CARPAL TUNNEL RELEASE Bilateral 02/26/07,05/21/07  . COLPORRHAPHY    . DILATION AND CURETTAGE OF UTERUS    . [place]  . FOOT SURGERY    . HAMMER TOE SURGERY Left 03/22/2015   Procedure: HAMMER TOE CORRECTION L 2ND AND 3RD ;  Surgeon: Albertine Patricia, DPM;  Location: [place];  Service: Podiatry;  Laterality: Left;  WITH LOCAL  . HERNIA REPAIR  1988  . NECK SURGERY  2006   fusion  . RECTOCELE REPAIR N/A 08/25/2016   Procedure: POSTERIOR REPAIR (RECTOCELE);  Surgeon: Brayton Mars, MD;  Location: [facility];  Service: Gynecology;  Laterality: N/A;  . SPINAL CORD STIMULATOR IMPLANT    . TONSILLECTOMY  1985   Family History  Problem Relation Age of Onset  . Diabetes Father   . Hypertension Father   . CAD Father   . Diabetes Sister        non-Insulin dependent diabetes mellitus  . Heart disease Brother   . Cancer Daughter        cancer of the cervix at age 69-18  . Heart disease Brother   . Breast cancer Paternal Aunt   . Colon cancer Paternal Uncle   . Ovarian cancer Neg Hx    Social History   Socioeconomic History  . Marital status: Legally Separated    Spouse name: None  . Number of children: 4  . Years of education: None  . Highest education level: 9th grade  Social Needs  . Financial resource strain:  Hard  . Food insecurity - worry: Sometimes true  . Food insecurity - inability: Sometimes true  . Transportation needs - medical: No  . Transportation needs - non-medical: No  Occupational History  . Occupation: disabled  Tobacco Use  . Smoking status: Never Smoker  . Smokeless tobacco: Never Used  Substance and Sexual Activity  . Alcohol use: No    Alcohol/week: 0.0 oz    Frequency: Never  . Drug use: No  . Sexual activity: Yes    Birth control/protection: Surgical  Other Topics Concern  . None  Social History Narrative   Pt is currently receiving assistance from [employer] and gets food stamps.     Outpatient Encounter Medications as of 06/09/2017  Medication Sig  . aspirin EC 81 MG tablet Take 81 mg by mouth at bedtime.  Marland Kitchen buPROPion (WELLBUTRIN SR) 150 MG 12 hr tablet TAKE 1 TABLET BY MOUTH TWO TIMES DAILY  . Cholecalciferol (VITAMIN D3) 5000 UNITS CAPS Take 5,000 Units by mouth daily.   . cyclobenzaprine (FLEXERIL) 10 MG tablet Take 10 mg by mouth 3 (three) times daily as needed for muscle spasms.  Marland Kitchen desvenlafaxine (PRISTIQ) 100 MG 24 hr tablet TAKE 1 TABLET EVERY DAY  . diazepam (VALIUM) 10 MG tablet TAKE ONE TABLET BY MOUTH FOUR TIMES A DAY  . estradiol (ESTRACE) 1 MG tablet TAKE 1 TABLET BY MOUTH DAILY  . linaclotide (LINZESS) 145 MCG CAPS capsule Take 1 capsule (145 mcg total) by mouth daily.  . meloxicam (MOBIC) 15 MG tablet Take 15 mg by mouth daily.   . Multiple Vitamin (MULTIVITAMIN WITH MINERALS) TABS tablet Take 1 tablet by mouth daily.  . mupirocin cream (BACTROBAN) 2 % Apply 1 application topically 2 (two) times daily.  Marland Kitchen oxyCODONE (ROXICODONE) 15 MG immediate release tablet Take 1-2 tablets (15-30 mg total) by mouth 4 (four) times daily as needed.  . phentermine 15 MG capsule Take 1 capsule (15 mg total) by mouth every morning.  . polyethylene glycol powder (GLYCOLAX/MIRALAX) powder Take 17 g by mouth 2 (two) times daily as needed. (Patient taking differently: Take 17  g by mouth 2 (two) times daily as needed (FOR CONSTIPATION). )  . pravastatin (PRAVACHOL) 40 MG tablet TAKE 1 TABLET BY MOUTH DAILY  . pregabalin (LYRICA) 75 MG capsule Take 1 capsule (75 mg total) 3 (three) times daily by mouth.  . RESTASIS 0.05 % ophthalmic emulsion Apply 1 drop to eye 2 (two) times daily.  Marland Kitchen rOPINIRole (REQUIP) 1 MG tablet Take 1 tablet (1 mg total) by mouth at bedtime.  . topiramate (TOPAMAX) 50 MG tablet Take 1 tablet (50 mg total) by mouth daily.  . [DISCONTINUED] Cyanocobalamin (VITAMIN B-12) 500 MCG SUBL Place 500  mcg under the tongue daily.  . [DISCONTINUED] MYRBETRIQ 25 MG TB24 tablet TAKE 1 TABLET BY MOUTH DAILY   No facility-administered encounter medications on file as of 06/09/2017.     Activities of Daily Living In your present state of health, do you have any difficulty performing the following activities: 06/09/2017 08/25/2016  Hearing? N N  Vision? N N  Comment - -  Difficulty concentrating or making decisions? N N  Walking or climbing stairs? Y Y  Comment due to pain from back -  Dressing or bathing? N N  Doing errands, shopping? N N  Preparing Food and eating ? N -  Using the Toilet? N -  In the past six months, have you accidently leaked urine? N -  Do you have problems with loss of bowel control? N -  Managing your Medications? N -  Managing your Finances? N -  Housekeeping or managing your Housekeeping? N -  Some recent data might be hidden    Patient Care Team: Birdie Sons, MD as PCP - General (Family Medicine) Glenna Fellows, MD as Attending Physician (Neurosurgery) Thelma Comp, [place] as Consulting Physician (Optometry) Defrancesco, Alanda Slim, MD as Consulting Physician (Obstetrics and Gynecology) Murrell Redden, MD as Consulting Physician (Urology) Milinda Pointer, MD as Referring Physician (Pain Medicine)    Assessment:   This is a routine wellness examination for Amsi.  Exercise Activities and Dietary  recommendations Current Exercise Habits: The patient does not participate in regular exercise at present, Exercise limited by: None identified  Goals    . Exercise 3x per week (30 min per time)     Recommend to start exercising 3 times a week for at least 30 minutes. Pt to start this in 2019.       Fall Risk Fall Risk  06/09/2017 05/30/2016 05/08/2015  Falls in the past year? No No No   Is the patient's home free of loose throw rugs in walkways, pet beds, electrical cords, etc?   yes      Grab bars in the bathroom? yes      Handrails on the stairs?   yes      Adequate lighting?   yes  Timed Get Up and Go performed: N/A  Depression Screen PHQ 2/9 Scores 06/09/2017 06/09/2017 05/30/2016 05/08/2015  PHQ - 2 Score 0 0 1 0  PHQ- 9 Score 1 - - -     Cognitive Function: Pt declined screening today.     6CIT Screen 05/30/2016  What Year? 0 points  What month? 0 points  What time? 0 points  Count back from 20 0 points  Months in reverse 0 points  Repeat phrase 4 points  Total Score 4    Immunization History  Administered Date(s) Administered  . Influenza-Unspecified 04/23/2013, 03/20/2017  . Pneumococcal Conjugate-13 05/11/2014  . Tdap 09/21/2015, 09/03/2016  . Zoster 01/13/2011    Qualifies for Shingles Vaccine? Due for Shingles vaccine. Declined my offer to administer today. Education has been provided regarding the importance of this vaccine. Pt has been advised to call her insurance company to determine her out of pocket expense. Advised she may also receive this vaccine at her local pharmacy or Health Dept. Verbalized acceptance and understanding.  Screening Tests Health Maintenance  Topic Date Due  . PAP SMEAR  04/11/2019  . MAMMOGRAM  06/05/2019  . COLONOSCOPY  01/13/2023  . TETANUS/TDAP  09/04/2026  . INFLUENZA VACCINE  Completed  . Hepatitis C Screening  Completed  .  HIV Screening  Completed    Cancer Screenings: Lung: Low Dose CT Chest recommended if Age  85-80 years, 30 pack-year currently smoking OR have quit w/in 15years. Patient does not qualify. Breast:  Up to date on Mammogram? Yes   Up to date of Bone Density/Dexa? N/A Colorectal: Up to date   Additional Screenings:  Hepatitis B/HIV/Syphillis: HIV completed, pt declines the others. Hepatitis C Screening: up to date     Plan:  I have personally reviewed and addressed the Medicare Annual Wellness questionnaire and have noted the following in the patient's chart:  A. Medical and social history B. Use of alcohol, tobacco or illicit drugs  C. Current medications and supplements D. Functional ability and status E.  Nutritional status F.  Physical activity G. Advance directives H. List of other physicians I.  Hospitalizations, surgeries, and ER visits in previous 12 months J.  [place] such as hearing and vision if needed, cognitive and depression L. Referrals and appointments - none  In addition, I have reviewed and discussed with patient certain preventive protocols, quality metrics, and best practice recommendations. A written personalized care plan for preventive services as well as general preventive health recommendations were provided to patient.  See attached scanned questionnaire for additional information.   Signed,  Fabio Neighbors, LPN Nurse Health Advisor   Nurse Recommendations: None. Pt scheduled B12 injection for today- FYI!

## 2017-06-09 NOTE — Patient Instructions (Signed)
Courtney Ross , Thank you for taking time to come for your Medicare Wellness Visit. I appreciate your ongoing commitment to your health goals. Please review the following plan we discussed and let me know if I can assist you in the future.   Screening recommendations/referrals: Colonoscopy: Up to date Mammogram: Up to date Bone Density: N/A Recommended yearly ophthalmology/optometry visit for glaucoma screening and checkup Recommended yearly dental visit for hygiene and checkup  Vaccinations: Influenza vaccine: Up to date Pneumococcal vaccine: Due at age 41 Tdap vaccine: Up to date Shingles vaccine: Received Zostavax 12/2010    Advanced directives: Advance directive discussed with you today. Even though you declined this today please call our office should you change your mind and we can give you the proper paperwork for you to fill out.  Conditions/risks identified: Obesity- recommend to start exercising 3 times a week for at least 30 minutes  Next appointment: None.  Preventive Care 40-64 Years, Female Preventive care refers to lifestyle choices and visits with your health care provider that can promote health and wellness. What does preventive care include?  A yearly physical exam. This is also called an annual well check.  Dental exams once or twice a year.  Routine eye exams. Ask your health care provider how often you should have your eyes checked.  Personal lifestyle choices, including:  Daily care of your teeth and gums.  Regular physical activity.  Eating a healthy diet.  Avoiding tobacco and drug use.  Limiting alcohol use.  Practicing safe sex.  Taking low-dose aspirin daily starting at age 24.  Taking vitamin and mineral supplements as recommended by your health care provider. What happens during an annual well check? The services and screenings done by your health care provider during your annual well check will depend on your age, overall health, lifestyle  risk factors, and family history of disease. Counseling  Your health care provider may ask you questions about your:  Alcohol use.  Tobacco use.  Drug use.  Emotional well-being.  Home and relationship well-being.  Sexual activity.  Eating habits.  Work and work Statistician.  Method of birth control.  Menstrual cycle.  Pregnancy history. Screening  You may have the following tests or measurements:  Height, weight, and BMI.  Blood pressure.  Lipid and cholesterol levels. These may be checked every 5 years, or more frequently if you are over 78 years old.  Skin check.  Lung cancer screening. You may have this screening every year starting at age 36 if you have a 30-pack-year history of smoking and currently smoke or have quit within the past 15 years.  Fecal occult blood test (FOBT) of the stool. You may have this test every year starting at age 84.  Flexible sigmoidoscopy or colonoscopy. You may have a sigmoidoscopy every 5 years or a colonoscopy every 10 years starting at age 71.  Hepatitis C blood test.  Hepatitis B blood test.  Sexually transmitted disease (STD) testing.  Diabetes screening. This is done by checking your blood sugar (glucose) after you have not eaten for a while (fasting). You may have this done every 1-3 years.  Mammogram. This may be done every 1-2 years. Talk to your health care provider about when you should start having regular mammograms. This may depend on whether you have a family history of breast cancer.  BRCA-related cancer screening. This may be done if you have a family history of breast, ovarian, tubal, or peritoneal cancers.  Pelvic exam and Pap  test. This may be done every 3 years starting at age 70. Starting at age 24, this may be done every 5 years if you have a Pap test in combination with an HPV test.  Bone density scan. This is done to screen for osteoporosis. You may have this scan if you are at high risk for  osteoporosis. Discuss your test results, treatment options, and if necessary, the need for more tests with your health care provider. Vaccines  Your health care provider may recommend certain vaccines, such as:  Influenza vaccine. This is recommended every year.  Tetanus, diphtheria, and acellular pertussis (Tdap, Td) vaccine. You may need a Td booster every 10 years.  Zoster vaccine. You may need this after age 46.  Pneumococcal 13-valent conjugate (PCV13) vaccine. You may need this if you have certain conditions and were not previously vaccinated.  Pneumococcal polysaccharide (PPSV23) vaccine. You may need one or two doses if you smoke cigarettes or if you have certain conditions. Talk to your health care provider about which screenings and vaccines you need and how often you need them. This information is not intended to replace advice given to you by your health care provider. Make sure you discuss any questions you have with your health care provider. Document Released: 07/06/2015 Document Revised: 02/27/2016 Document Reviewed: 04/10/2015 Elsevier Interactive Patient Education  2017 [place] Prevention in the Home Falls can cause injuries. They can happen to people of all ages. There are many things you can do to make your home safe and to help prevent falls. What can I do on the outside of my home?  Regularly fix the edges of walkways and driveways and fix any cracks.  Remove anything that might make you trip as you walk through a door, such as a raised step or threshold.  Trim any bushes or trees on the path to your home.  Use bright outdoor lighting.  Clear any walking paths of anything that might make someone trip, such as rocks or tools.  Regularly check to see if handrails are loose or broken. Make sure that both sides of any steps have handrails.  Any raised decks and porches should have guardrails on the edges.  Have any leaves, snow, or ice cleared  regularly.  Use sand or salt on walking paths during winter.  Clean up any spills in your garage right away. This includes oil or grease spills. What can I do in the bathroom?  Use night lights.  Install grab bars by the toilet and in the tub and shower. Do not use towel bars as grab bars.  Use non-skid mats or decals in the tub or shower.  If you need to sit down in the shower, use a plastic, non-slip stool.  Keep the floor dry. Clean up any water that spills on the floor as soon as it happens.  Remove soap buildup in the tub or shower regularly.  Attach bath mats securely with double-sided non-slip rug tape.  Do not have throw rugs and other things on the floor that can make you trip. What can I do in the bedroom?  Use night lights.  Make sure that you have a light by your bed that is easy to reach.  Do not use any sheets or blankets that are too big for your bed. They should not hang down onto the floor.  Have a firm chair that has side arms. You can use this for support while you get  dressed.  Do not have throw rugs and other things on the floor that can make you trip. What can I do in the kitchen?  Clean up any spills right away.  Avoid walking on wet floors.  Keep items that you use a lot in easy-to-reach places.  If you need to reach something above you, use a strong step stool that has a grab bar.  Keep electrical cords out of the way.  Do not use floor polish or wax that makes floors slippery. If you must use wax, use non-skid floor wax.  Do not have throw rugs and other things on the floor that can make you trip. What can I do with my stairs?  Do not leave any items on the stairs.  Make sure that there are handrails on both sides of the stairs and use them. Fix handrails that are broken or loose. Make sure that handrails are as long as the stairways.  Check any carpeting to make sure that it is firmly attached to the stairs. Fix any carpet that is loose  or worn.  Avoid having throw rugs at the top or bottom of the stairs. If you do have throw rugs, attach them to the floor with carpet tape.  Make sure that you have a light switch at the top of the stairs and the bottom of the stairs. If you do not have them, ask someone to add them for you. What else can I do to help prevent falls?  Wear shoes that:  Do not have high heels.  Have rubber bottoms.  Are comfortable and fit you well.  Are closed at the toe. Do not wear sandals.  If you use a stepladder:  Make sure that it is fully opened. Do not climb a closed stepladder.  Make sure that both sides of the stepladder are locked into place.  Ask someone to hold it for you, if possible.  Clearly mark and make sure that you can see:  Any grab bars or handrails.  First and last steps.  Where the edge of each step is.  Use tools that help you move around (mobility aids) if they are needed. These include:  Canes.  Walkers.  Scooters.  Crutches.  Turn on the lights when you go into a dark area. Replace any light bulbs as soon as they burn out.  Set up your furniture so you have a clear path. Avoid moving your furniture around.  If any of your floors are uneven, fix them.  If there are any pets around you, be aware of where they are.  Review your medicines with your doctor. Some medicines can make you feel dizzy. This can increase your chance of falling. Ask your doctor what other things that you can do to help prevent falls. This information is not intended to replace advice given to you by your health care provider. Make sure you discuss any questions you have with your health care provider. Document Released: 04/05/2009 Document Revised: 11/15/2015 Document Reviewed: 07/14/2014 Elsevier Interactive Patient Education  2017 [employer].

## 2017-06-24 ENCOUNTER — Other Ambulatory Visit: Payer: Self-pay | Admitting: Family Medicine

## 2017-06-24 NOTE — Telephone Encounter (Signed)
Patient needs refill on Oxycodone 

## 2017-06-25 MED ORDER — OXYCODONE HCL 15 MG PO TABS: 15.0000 mg | ORAL_TABLET | Freq: Four times a day (QID) | ORAL | 0 refills | Status: DC | PRN

## 2017-07-17 DIAGNOSIS — M47816 Spondylosis without myelopathy or radiculopathy, lumbar region: Secondary | ICD-10-CM | POA: Diagnosis not present

## 2017-07-22 ENCOUNTER — Other Ambulatory Visit: Payer: Self-pay | Admitting: Family Medicine

## 2017-07-22 MED ORDER — MUPIROCIN CALCIUM 2 % EX CREA: 1.0000 "application " | TOPICAL_CREAM | Freq: Two times a day (BID) | CUTANEOUS | 1 refills | Status: DC

## 2017-07-22 MED ORDER — OXYCODONE HCL 15 MG PO TABS: 15.0000 mg | ORAL_TABLET | Freq: Four times a day (QID) | ORAL | 0 refills | Status: DC | PRN

## 2017-07-22 NOTE — Telephone Encounter (Signed)
Pt contacted office for refill request on the following medications:  1. oxyCODONE (ROXICODONE) 15 MG immediate release tablet  2. mupirocin cream (BACTROBAN) 2 %  [facility]  Please advise. Thanks TNP

## 2017-07-23 NOTE — Telephone Encounter (Signed)
Patient advised.

## 2017-07-30 ENCOUNTER — Other Ambulatory Visit: Payer: Self-pay | Admitting: Family Medicine

## 2017-07-30 MED ORDER — DESVENLAFAXINE SUCCINATE ER 100 MG PO TB24: 100.0000 mg | ORAL_TABLET | Freq: Every day | ORAL | 12 refills | Status: DC

## 2017-07-30 NOTE — Telephone Encounter (Signed)
[facility] faxed a refill request for the following medication. Thanks CC  desvenlafaxine (PRISTIQ) 100 MG 24 hr tablet

## 2017-08-12 ENCOUNTER — Telehealth: Payer: Self-pay | Admitting: *Deleted

## 2017-08-12 MED ORDER — MIRABEGRON ER 25 MG PO TB24: 25.0000 mg | ORAL_TABLET | Freq: Every day | ORAL | 1 refills | Status: DC

## 2017-08-12 NOTE — Telephone Encounter (Signed)
Patient called and states she needs a refill of her Myrbetriq. Patient pharmacy is [facility] . Please advise. Thank you

## 2017-08-12 NOTE — Telephone Encounter (Signed)
LMTRC

## 2017-08-12 NOTE — Telephone Encounter (Signed)
Pt states she has been on myrbetriq 25mg  qd. Will send in refill.

## 2017-08-14 ENCOUNTER — Other Ambulatory Visit: Payer: Self-pay | Admitting: Family Medicine

## 2017-08-14 MED ORDER — ROPINIROLE HCL 1 MG PO TABS: 1.0000 mg | ORAL_TABLET | Freq: Every day | ORAL | 11 refills | Status: DC

## 2017-08-14 NOTE — Telephone Encounter (Signed)
[facility] faxed a refill request for the following medication. Thanks CC  rOPINIRole (REQUIP) 1 MG tablet

## 2017-08-20 ENCOUNTER — Other Ambulatory Visit: Payer: Self-pay | Admitting: Family Medicine

## 2017-08-20 NOTE — Telephone Encounter (Signed)
Pt contacted office for refill request on the following medications:  oxyCODONE (ROXICODONE) 15 MG immediate release tablet  [facility]  Last Rx: 07/22/17 LOV: 03/27/17 Please advise. Thanks TNP

## 2017-08-21 MED ORDER — OXYCODONE HCL 15 MG PO TABS: 15.0000 mg | ORAL_TABLET | Freq: Four times a day (QID) | ORAL | 0 refills | Status: DC | PRN

## 2017-08-28 ENCOUNTER — Other Ambulatory Visit: Payer: Self-pay | Admitting: Family Medicine

## 2017-09-11 ENCOUNTER — Encounter: Payer: Self-pay | Admitting: Obstetrics and Gynecology

## 2017-09-11 ENCOUNTER — Ambulatory Visit (INDEPENDENT_AMBULATORY_CARE_PROVIDER_SITE_OTHER): Payer: Medicare Other | Admitting: Obstetrics and Gynecology

## 2017-09-11 VITALS — BP 132/88 | HR 98 | Ht 68.0 in | Wt 213.5 lb

## 2017-09-11 DIAGNOSIS — E669 Obesity, unspecified: Secondary | ICD-10-CM

## 2017-09-11 MED ORDER — CYANOCOBALAMIN 1000 MCG/ML IJ SOLN: 1000.0000 ug | INTRAMUSCULAR | 1 refills | Status: DC

## 2017-09-11 NOTE — Progress Notes (Signed)
Subjective:  Courtney Ross is a 62 y.o. [id number] at Unknown being seen today for weight loss management- initial visit. Is currently exercising with cardio and water aerobics 3 times a week.   Patient reports General ROS: negative and reports previous weight loss attempts:low dose phentermine.  Onset was gradual over year(s) ago.     Pertinent medical history includes: chronic digestive disease, diabetes, eating disorder, anxiety and psychiatric illness.  Risk factors include: back pain, and exercise limits.  The patient has a surgical history [id number] fusion and partial hysterectomy, tonsilectomy, bilateral foot surgeries, appendectomy.     The following portions of the patient's history were reviewed and updated as appropriate: allergies, current medications, past family history, past medical history, past social history, past surgical history and problem list.   Objective:   Vitals:   09/11/17 1007  BP: 132/88  Pulse: 98  Weight: 213 lb 8 oz (96.8 kg)  Height: 5\' 8"  (1.727 m)    General:  Alert, oriented and cooperative. Patient is in no acute distress.  :   :   :   :   :   :   PE: Well groomed female in no current distress,   Mental Status: Normal mood and affect. Normal behavior. Normal judgment and thought content.   Current BMI: Body mass index is 32.46 kg/m.   Assessment and Plan:  Obesity  There are no diagnoses linked to this encounter.  Plan: low carb, High protein diet RX for adipex 37.5 mg daily and B12 1034mcg.ml monthly, to start now with first injection given at today's visit. Reviewed side-effects common to both medications and expected outcomes. Increase daily water intake to at least 8 bottle a day, every day.  Goal is to reduse weight by 10% by end of three months, and will re-evaluate then.  RTC in 4 weeks for Nurse visit to check weight & BP, and get next B12 injections.    Please refer to After Visit Summary for other counseling  recommendations.    [place], Talibah Colasurdo N, CNM   Temari Schooler [place], CNM      Consider the Low Glycemic Index Diet and 6 smaller meals daily .  This boosts your metabolism and regulates your sugars:   Use the protein bar by Atkins because they have lots of fiber in them  Find the low carb flatbreads, tortillas and pita breads for sandwiches:  Joseph's makes a pita bread and a flat bread , available at [facility] and [employer]; [place] makes a low carb flatbread available at [employer] and HT that is 9 net carbs and 100 cal [place] makes a low carb whole wheat tortilla available at [employer] most grocery stores with 6 net carbs and 210 cal  [place] yogurt can still have a lot of carbs .  Dannon Light N fit has 80 cal and 8 carbs

## 2017-09-11 NOTE — Patient Instructions (Addendum)
Plan: low carb, High protein diet RX for adipex 37.5 mg daily and B12 1076mcg.ml monthly, to start now with first injection given at today's visit. Reviewed side-effects common to both medications and expected outcomes. Increase daily water intake to at least 8 bottle a day, every day.  Goal is to reduse weight by 10% by end of three months, and will re-evaluate then.  Consider the Low Glycemic Index Diet and 6 smaller meals daily .  This boosts your metabolism and regulates your sugars:   Use the protein bar by Atkins because they have lots of fiber in them  Find the low carb flatbreads, tortillas and pita breads for sandwiches:  Joseph's makes a pita bread and a flat bread , available at [facility] and [employer]; [place] makes a low carb flatbread available at [employer] and HT that is 9 net carbs and 100 cal [place] makes a low carb whole wheat tortilla available at [employer] most grocery stores with 6 net carbs and 210 cal  [place] yogurt can still have lots of carbs. Dannon Light N Fit has 80 cals & 8 carbs.

## 2017-09-18 ENCOUNTER — Telehealth: Payer: Self-pay | Admitting: Family Medicine

## 2017-09-18 MED ORDER — OXYCODONE HCL 15 MG PO TABS: 15.0000 mg | ORAL_TABLET | Freq: Four times a day (QID) | ORAL | 0 refills | Status: DC | PRN

## 2017-09-18 NOTE — Telephone Encounter (Signed)
Patient requesting refill on her oxyCODONE (ROXICODONE) 15 MG immediate release tablet

## 2017-10-06 DIAGNOSIS — M47816 Spondylosis without myelopathy or radiculopathy, lumbar region: Secondary | ICD-10-CM | POA: Diagnosis not present

## 2017-10-12 ENCOUNTER — Encounter: Payer: Self-pay | Admitting: Obstetrics and Gynecology

## 2017-10-12 ENCOUNTER — Ambulatory Visit (INDEPENDENT_AMBULATORY_CARE_PROVIDER_SITE_OTHER): Payer: Medicare Other | Admitting: Obstetrics and Gynecology

## 2017-10-12 VITALS — BP 159/90 | HR 118 | Wt 199.5 lb

## 2017-10-12 DIAGNOSIS — E669 Obesity, unspecified: Secondary | ICD-10-CM | POA: Diagnosis not present

## 2017-10-12 MED ORDER — CYANOCOBALAMIN 1000 MCG/ML IJ SOLN
1000.0000 ug | Freq: Once | INTRAMUSCULAR | Status: AC
  Administered 2017-10-12: 1000 ug via INTRAMUSCULAR

## 2017-10-12 NOTE — Progress Notes (Signed)
Pt is here for wt, bp check, b-12 inj She is doing well, denies any s/e, pt is very happy lost 14lb this month!!!!  10/12/17 wt- 199.5lb 09/11/17 wt= 213lb

## 2017-10-22 ENCOUNTER — Other Ambulatory Visit: Payer: Self-pay | Admitting: Family Medicine

## 2017-10-22 NOTE — Telephone Encounter (Signed)
pt needs refill on her oxycodone 15mg   [facility]  Thanks teri

## 2017-10-23 MED ORDER — OXYCODONE HCL 15 MG PO TABS: 15.0000 mg | ORAL_TABLET | Freq: Four times a day (QID) | ORAL | 0 refills | Status: DC | PRN

## 2017-11-18 ENCOUNTER — Other Ambulatory Visit: Payer: Self-pay | Admitting: Family Medicine

## 2017-11-18 ENCOUNTER — Other Ambulatory Visit: Payer: Self-pay

## 2017-11-18 NOTE — Patient Outreach (Signed)
[facility]) Care Management  11/18/2017  ARIONA DESCHENE 02/01/1956 [id number]   Medication Adherence call to Mrs. Meegan Shanafelt left a message for patient to call back patient is due on Pravastatin 40 mg. Under [facility] Ins.   [place] Management Direct Dial [phone]  Fax [phone] [email or web address]

## 2017-11-18 NOTE — Telephone Encounter (Signed)
Pt contacted office for refill request on the following medications:  oxyCODONE (ROXICODONE) 15 MG immediate release tablet   [facility]  Last Rx: 10/23/17 LOV: 03/27/17 Please advise. Thanks TNP

## 2017-11-19 MED ORDER — OXYCODONE HCL 15 MG PO TABS: 15.0000 mg | ORAL_TABLET | Freq: Four times a day (QID) | ORAL | 0 refills | Status: DC | PRN

## 2017-11-23 ENCOUNTER — Encounter: Payer: Self-pay | Admitting: Obstetrics and Gynecology

## 2017-11-23 ENCOUNTER — Ambulatory Visit (INDEPENDENT_AMBULATORY_CARE_PROVIDER_SITE_OTHER): Payer: Medicare Other | Admitting: Obstetrics and Gynecology

## 2017-11-23 VITALS — BP 132/90 | HR 97 | Wt 188.4 lb

## 2017-11-23 DIAGNOSIS — E669 Obesity, unspecified: Secondary | ICD-10-CM

## 2017-11-23 NOTE — Progress Notes (Signed)
Pt is here for wt, bp check, b-12 inj She is doing well, denies any s/e, pt is getting her b-12 at her pharmacy   11/23/17 wt- 188.4lb 10/12/17 wt- 199lb

## 2017-12-03 ENCOUNTER — Encounter: Payer: Medicare Other | Admitting: Obstetrics and Gynecology

## 2017-12-03 ENCOUNTER — Other Ambulatory Visit: Payer: Self-pay

## 2017-12-03 ENCOUNTER — Other Ambulatory Visit: Payer: Self-pay | Admitting: Family Medicine

## 2017-12-03 NOTE — Patient Outreach (Signed)
[facility]) Care Management  12/03/2017  SHAINNA FAUX 13-Oct-1955 [id number]   Medication Adherence call to Mrs. Jackquelyn Sundberg left a message for patient to call back Mrs. Strada is showing past due on Pravastatin 40 mg. Under [facility] Ins.   [place] Management Direct Dial [phone]  Fax [phone] [email or web address]

## 2017-12-10 ENCOUNTER — Encounter: Payer: Self-pay | Admitting: Obstetrics and Gynecology

## 2017-12-10 ENCOUNTER — Ambulatory Visit (INDEPENDENT_AMBULATORY_CARE_PROVIDER_SITE_OTHER): Payer: Medicare Other | Admitting: Obstetrics and Gynecology

## 2017-12-10 VITALS — BP 158/85 | HR 101 | Ht 68.0 in | Wt 184.5 lb

## 2017-12-10 DIAGNOSIS — N941 Unspecified dyspareunia: Secondary | ICD-10-CM

## 2017-12-10 DIAGNOSIS — Z09 Encounter for follow-up examination after completed treatment for conditions other than malignant neoplasm: Secondary | ICD-10-CM

## 2017-12-10 DIAGNOSIS — G8929 Other chronic pain: Secondary | ICD-10-CM

## 2017-12-10 DIAGNOSIS — N8111 Cystocele, midline: Secondary | ICD-10-CM | POA: Diagnosis not present

## 2017-12-10 DIAGNOSIS — R102 Pelvic and perineal pain: Secondary | ICD-10-CM | POA: Diagnosis not present

## 2017-12-10 DIAGNOSIS — R1031 Right lower quadrant pain: Secondary | ICD-10-CM

## 2017-12-10 DIAGNOSIS — K644 Residual hemorrhoidal skin tags: Secondary | ICD-10-CM | POA: Diagnosis not present

## 2017-12-10 MED ORDER — MIRABEGRON ER 25 MG PO TB24: 25.0000 mg | ORAL_TABLET | Freq: Every day | ORAL | 1 refills | Status: DC

## 2017-12-10 NOTE — Patient Instructions (Signed)
1.  Continue using estradiol 1 mg daily 2.  Continue using lubricants for intercourse 3.  Return in 6 months for annual exam

## 2017-12-10 NOTE — Progress Notes (Signed)
Bladder wall have GYN ENCOUNTER NOTE  Subjective:       Courtney Ross is a 62 y.o. [id number] female is here for gynecologic evaluation of the following issues:  1.  Chronic right lower quadrant pain.   2.  Deep thrusting dyspareunia  Long history of chronic right lower quadrant pain; status post hysterectomy, status post herniorrhaphy (inguinal), and status post appendectomy; patient has a nerve stimulator implant.  Has chronic right-sided symptoms which have not worsened.  She has learned to live with this discomfort.  Patient is status post abdominal hysterectomy, and status post posterior colporrhaphy with enterocele ligation 1 year ago; postoperatively she had granulation tissue that was symptomatic and subsequently excised. At this time she is not experiencing any vaginal discharge or vaginal bleeding-since the excision of the vaginal granulation tissue.  However, she is experiencing deep thrusting dyspareunia with deep insertion during intercourse.  She has learned to manage this condition with controlling the depth of penetration.  She is not experiencing any vaginal bleeding post intercourse.  Bowel function is unchanged, having bowel frequency approximately every other day. Patient reports no significant bladder pathology.  Gynecologic History No LMP recorded. Patient has had a hysterectomy.  Obstetric History OB History  Gravida Para Term Preterm AB Living  7 4 4   3 4   SAB TAB Ectopic Multiple Live Births  2   1   4     # Outcome Date GA Lbr Len/2nd Weight Sex Delivery Anes PTL Lv  7 Term 1984   6 lb 6.4 oz (2.903 kg) F Vag-Spont   LIV  6 Term 1980   8 lb 4.8 oz (3.765 kg) M Vag-Spont   LIV  5 Term 1977   7 lb (3.175 kg) M Vag-Spont   LIV  4 Term 1972   7 lb 9.6 oz (3.447 kg) M Vag-Spont   LIV  3 Ectopic           2 SAB           1 SAB             Past Medical History:  Diagnosis Date  . Anxiety   . Back pain   . Constipation   . Degenerative disc disease, lumbar    . Depression   . Family history of adverse reaction to anesthesia    daughter nausea  . Fatty liver   . GERD (gastroesophageal reflux disease)   . Headache    migraines  . Headache, migraine 05/08/2015  . High cholesterol   . Hypertension   . Lower back pain   . Polycystic ovarian disease   . PONV (postoperative nausea and vomiting)   . Restless leg syndrome   . Spasm 01/29/2009    Past Surgical History:  Procedure Laterality Date  . [place]  . APPENDECTOMY  1988  . ARTHRODESIS METATARSALPHALANGEAL JOINT (MTPJ) Left 12/14/2014   Procedure: ARTHRODESIS METATARSALPHALANGEAL JOINT (MTPJ) 1ST;  Surgeon: Albertine Patricia, DPM;  Location: [facility];  Service: Podiatry;  Laterality: Left;  LMA  . BACK SURGERY  [phone]   x3  . BLADDER SUSPENSION  2006  . BREAST BIOPSY Left 1990's  . BREAST SURGERY  2005   lumpectomy  . CARPAL TUNNEL RELEASE Bilateral 02/26/07,05/21/07  . COLPORRHAPHY    . DILATION AND CURETTAGE OF UTERUS    . [place]  . FOOT SURGERY    . HAMMER TOE SURGERY Left 03/22/2015  Procedure: HAMMER TOE CORRECTION L 2ND AND 3RD ;  Surgeon: Albertine Patricia, DPM;  Location: [facility];  Service: Podiatry;  Laterality: Left;  WITH LOCAL  . HERNIA REPAIR  1988  . NECK SURGERY  2006   fusion  . RECTOCELE REPAIR N/A 08/25/2016   Procedure: POSTERIOR REPAIR (RECTOCELE);  Surgeon: Brayton Mars, MD;  Location: [facility];  Service: Gynecology;  Laterality: N/A;  . SPINAL CORD STIMULATOR IMPLANT    . TONSILLECTOMY  1985    Current Outpatient Medications on File Prior to Visit  Medication Sig Dispense Refill  . aspirin EC 81 MG tablet Take 81 mg by mouth at bedtime.    Marland Kitchen buPROPion (WELLBUTRIN SR) 150 MG 12 hr tablet TAKE 1 TABLET BY MOUTH TWO TIMES DAILY 60 tablet 11  . Cholecalciferol (VITAMIN D3) 5000 UNITS CAPS Take 5,000 Units by mouth daily.     . cyanocobalamin (,VITAMIN B-12,) 1000 MCG/ML  injection Inject 1 mL (1,000 mcg total) into the muscle every 30 (thirty) days. 10 mL 1  . cyclobenzaprine (FLEXERIL) 10 MG tablet Take 10 mg by mouth 3 (three) times daily as needed for muscle spasms.    . diazepam (VALIUM) 10 MG tablet TAKE 1 TABLET BY MOUTH 4 TIMES DAILY 120 tablet 3  . estradiol (ESTRACE) 1 MG tablet TAKE 1 TABLET BY MOUTH DAILY 90 tablet 4  . linaclotide (LINZESS) 145 MCG CAPS capsule Take 1 capsule (145 mcg total) by mouth daily. 30 capsule 12  . LYRICA 75 MG capsule TAKE 1 CAPSULE BY MOUTH THREE TIMES DAILY 90 capsule 5  . meloxicam (MOBIC) 15 MG tablet Take 15 mg by mouth daily.     . mirabegron ER (MYRBETRIQ) 25 MG TB24 tablet Take 1 tablet (25 mg total) by mouth daily. 90 tablet 1  . Multiple Vitamin (MULTIVITAMIN WITH MINERALS) TABS tablet Take 1 tablet by mouth daily.    . mupirocin cream (BACTROBAN) 2 % Apply 1 application topically 2 (two) times daily. 15 g 1  . oxyCODONE (ROXICODONE) 15 MG immediate release tablet Take 1-2 tablets (15-30 mg total) by mouth 4 (four) times daily as needed. 180 tablet 0  . phentermine 15 MG capsule Take 1 capsule (15 mg total) by mouth every morning. 30 capsule 5  . polyethylene glycol powder (GLYCOLAX/MIRALAX) powder Take 17 g by mouth 2 (two) times daily as needed. 3350 g 2  . RESTASIS 0.05 % ophthalmic emulsion Apply 1 drop to eye 2 (two) times daily.    Marland Kitchen rOPINIRole (REQUIP) 1 MG tablet Take 1 tablet (1 mg total) by mouth at bedtime. 30 tablet 11   No current facility-administered medications on file prior to visit.     Allergies  Allergen Reactions  . Hydrocodone Itching  . Lisinopril Cough  . Mirtazapine     Weight gain    Social History   Socioeconomic History  . Marital status: Legally Separated    Spouse name: Not on file  . Number of children: 4  . Years of education: Not on file  . Highest education level: 9th grade  Occupational History  . Occupation: disabled  Social Needs  . Financial resource strain:  Hard  . Food insecurity:    Worry: Sometimes true    Inability: Sometimes true  . Transportation needs:    Medical: No    Non-medical: No  Tobacco Use  . Smoking status: Never Smoker  . Smokeless tobacco: Never Used  Substance and Sexual Activity  . Alcohol use:  No    Alcohol/week: 0.0 oz    Frequency: Never  . Drug use: No  . Sexual activity: Yes    Birth control/protection: Surgical  Lifestyle  . Physical activity:    Days per week: Not on file    Minutes per session: Not on file  . Stress: Not at all  Relationships  . Social connections:    Talks on phone: Not on file    Gets together: Not on file    Attends religious service: Not on file    Active member of club or organization: Not on file    Attends meetings of clubs or organizations: Not on file    Relationship status: Not on file  . Intimate partner violence:    Fear of current or ex partner: No    Emotionally abused: No    Physically abused: No    Forced sexual activity: No  Other Topics Concern  . Not on file  Social History Narrative   Pt is currently receiving assistance from [employer] and gets food stamps.     Family History  Problem Relation Age of Onset  . Diabetes Father   . Hypertension Father   . CAD Father   . Diabetes Sister        non-Insulin dependent diabetes mellitus  . Heart disease Brother   . Cancer Daughter        cancer of the cervix at age 29-18  . Heart disease Brother   . Breast cancer Paternal Aunt   . Colon cancer Paternal Uncle   . Ovarian cancer Neg Hx     The following portions of the patient's history were reviewed and updated as appropriate: allergies, current medications, past family history, past medical history, past social history, past surgical history and problem list.  Review of Systems Review of Systems -comprehensive review of systems is negative except for that noted in the HPI  Objective:   BP (!) 158/85   Pulse (!) 101   Ht 5\' 8"  (1.727 m)   Wt 184 lb 8  oz (83.7 kg)   BMI 28.05 kg/m  CONSTITUTIONAL: Well-developed, well-nourished female in no acute distress.  HENT:  Normocephalic, atraumatic.  NECK: Normal range of motion, supple, no masses.  Normal thyroid.  SKIN: Skin is warm and dry. No rash noted. Not diaphoretic. No erythema. No pallor. [place]: Alert and oriented to person, place, and time. PSYCHIATRIC: Normal mood and affect. Normal behavior. Normal judgment and thought content. CARDIOVASCULAR:Not Examined RESPIRATORY: Not Examined BREASTS: Not Examined ABDOMEN: Soft, non distended; Non tender.  No Organomegaly. PELVIC:  External Genitalia: Normal  BUS: Normal  Vagina: First-degree cystocele; mild rectocele; apex of the vagina is well supported; good depth of vagina  Bimanual-no palpable masses; minimal tenderness at the vaginal cuff with appropriate postsurgical changes   Cervix: Surgically absent  Uterus: Surgically absent  Adnexa: Nonpalpable nontender  RV: External hemorrhoids, nonthrombosed; no perianal inflammation  Bladder: Nontender MUSCULOSKELETAL: Normal range of motion. No tenderness.  No cyanosis, clubbing, or edema.    Assessment:   1. Vaginal pain, improved post surgery  2. Chronic right groin pain, without recent exacerbation, secondary to multiple surgical procedures including placement of nerve stimulator  3. Status post posterior colporrhaphy with enterocele ligation  4. Cystocele, midline, first-degree, asymptomatic  5. Dyspareunia in female, deep thrusting  6. External hemorrhoids without complication     Plan:   1.  Continue estradiol 1 mg daily for ERT therapy 2.  Continue using lubricants  as needed for intercourse 3.  Modify intercourse activities to minimize deep thrusting activation of pelvic pain (avoid positions which exacerbates deep thrusting pain) 4.  Return in 6 months for follow-up  A total of 15 minutes were spent face-to-face with the patient during this encounter and over  half of that time dealt with counseling and coordination of care.   Brayton Mars, MD  Note: This dictation was prepared with Dragon dictation along with smaller phrase technology. Any transcriptional errors that result from this process are unintentional.

## 2017-12-11 ENCOUNTER — Other Ambulatory Visit: Payer: Self-pay | Admitting: Family Medicine

## 2017-12-11 MED ORDER — NALOXEGOL OXALATE 25 MG PO TABS: 25.0000 mg | ORAL_TABLET | Freq: Every day | ORAL | 3 refills | Status: DC

## 2017-12-11 NOTE — Progress Notes (Signed)
Patient here with daughter complaining of persistent constipation even when she takes two of th 145mg  Linzess. Try Movantik for OIC.

## 2017-12-14 ENCOUNTER — Other Ambulatory Visit: Payer: Self-pay | Admitting: Family Medicine

## 2017-12-21 ENCOUNTER — Other Ambulatory Visit: Payer: Self-pay | Admitting: Family Medicine

## 2017-12-21 MED ORDER — OXYCODONE HCL 15 MG PO TABS: 15.0000 mg | ORAL_TABLET | Freq: Four times a day (QID) | ORAL | 0 refills | Status: DC | PRN

## 2017-12-21 NOTE — Telephone Encounter (Signed)
Pt contacted office for refill request on the following medications:  oxyCODONE (ROXICODONE) 15 MG immediate release tablet   [facility]  Last Rx: 11/19/17 LOV: 03/27/17 Please advise. Thanks TNP

## 2017-12-23 ENCOUNTER — Encounter: Payer: Medicare Other | Admitting: Obstetrics and Gynecology

## 2017-12-30 ENCOUNTER — Encounter: Payer: Self-pay | Admitting: Obstetrics and Gynecology

## 2017-12-30 ENCOUNTER — Ambulatory Visit (INDEPENDENT_AMBULATORY_CARE_PROVIDER_SITE_OTHER): Payer: Medicare Other | Admitting: Obstetrics and Gynecology

## 2017-12-30 VITALS — BP 126/84 | HR 92 | Ht 68.0 in | Wt 178.8 lb

## 2017-12-30 DIAGNOSIS — Z6831 Body mass index (BMI) 31.0-31.9, adult: Secondary | ICD-10-CM

## 2017-12-30 DIAGNOSIS — E663 Overweight: Secondary | ICD-10-CM | POA: Diagnosis not present

## 2017-12-30 DIAGNOSIS — E669 Obesity, unspecified: Secondary | ICD-10-CM | POA: Diagnosis not present

## 2017-12-30 MED ORDER — PHENTERMINE HCL 15 MG PO CAPS: 15.0000 mg | ORAL_CAPSULE | ORAL | 2 refills | Status: DC

## 2017-12-30 NOTE — Progress Notes (Signed)
SUBJECTIVE:  62 y.o. here for follow-up weight loss visit, previously seen 4 weeks ago. Denies any concerns and feels like medication is working well with a 34 # weight loss since starting in March 2019. Desires to continue to 150lbs   OBJECTIVE:  BP 126/84   Pulse 92   Ht 5\' 8"  (1.727 m)   Wt 178 lb 12.8 oz (81.1 kg)   BMI 27.19 kg/m   Body mass index is 27.19 kg/m. Patient appears well. ASSESSMENT:  Overweight- responding well to weight loss plan PLAN:  To continue with current medications. B12 1054mcg/ml injection given RTC in 4 weeks as planned  Jazzlyn Huizenga [employer], CNM

## 2018-01-01 ENCOUNTER — Telehealth: Payer: Self-pay | Admitting: Family Medicine

## 2018-01-01 DIAGNOSIS — K5900 Constipation, unspecified: Secondary | ICD-10-CM

## 2018-01-01 MED ORDER — LUBIPROSTONE 24 MCG PO CAPS: 24.0000 ug | ORAL_CAPSULE | Freq: Two times a day (BID) | ORAL | 4 refills | Status: DC

## 2018-01-01 NOTE — Telephone Encounter (Signed)
-----   Message from Ola Spurr sent at 12/21/2017 11:42 AM EDT ----- Review incoming fax

## 2018-01-01 NOTE — Telephone Encounter (Signed)
Please advise patient Courtney Ross is not covered by insurance. They do cover amitiza. If she would like to try it instead can send prescription for 24mg  tablets, twice a day, #60, rf x 4

## 2018-01-01 NOTE — Telephone Encounter (Signed)
Patient advised and agrees to try Amitiza. Prescription sent into pharmacy.

## 2018-01-12 DIAGNOSIS — M47816 Spondylosis without myelopathy or radiculopathy, lumbar region: Secondary | ICD-10-CM | POA: Diagnosis not present

## 2018-01-12 DIAGNOSIS — M545 Low back pain: Secondary | ICD-10-CM | POA: Diagnosis not present

## 2018-01-13 ENCOUNTER — Telehealth: Payer: Self-pay | Admitting: Obstetrics and Gynecology

## 2018-01-13 ENCOUNTER — Other Ambulatory Visit: Payer: Self-pay | Admitting: Pharmacist

## 2018-01-13 ENCOUNTER — Other Ambulatory Visit: Payer: Self-pay | Admitting: Family Medicine

## 2018-01-13 ENCOUNTER — Other Ambulatory Visit: Payer: Self-pay | Admitting: *Deleted

## 2018-01-13 MED ORDER — CYANOCOBALAMIN 1000 MCG/ML IJ SOLN: 1000.0000 ug | INTRAMUSCULAR | 1 refills | Status: DC

## 2018-01-13 NOTE — Telephone Encounter (Signed)
Done-ac 

## 2018-01-13 NOTE — Telephone Encounter (Signed)
The patient called and stated that she needs a refill of her B12 injection sent to her pharmacy. [facility] on [address], The patient would like a call back to confirm and know when to pick up. Please advise.

## 2018-01-13 NOTE — Patient Outreach (Signed)
[facility]) Care Management  01/13/2018  Courtney Ross 1955/07/02 [id number]   Incoming call from [name] in response to the [facility] Medication Adherence Campaign. Speak with patient. HIPAA identifiers verified and verbal consent received.  Courtney Ross reports that she is no longer taking pravastatin. Reports that she discontinued this months ago after losing 30 pounds. Patient confirms that her PCP is aware. Note that per Epic chart review, pravastatin was discontinued off of her medication list in Epic.  Courtney Ross denies any medication questions/concerns at this time.  Will close pharmacy episode.  Harlow Asa, PharmD, [place] Management [phone]

## 2018-01-13 NOTE — Telephone Encounter (Signed)
The patient called and stated that she needs a refill of her B12 injection sent to her pharmacy. [facility] on [address], The patient would like a call back to confirm and know when to pick up. Please advise.

## 2018-01-14 ENCOUNTER — Other Ambulatory Visit: Payer: Self-pay | Admitting: Neurosurgery

## 2018-01-14 DIAGNOSIS — M546 Pain in thoracic spine: Secondary | ICD-10-CM

## 2018-01-18 ENCOUNTER — Ambulatory Visit
Admission: RE | Admit: 2018-01-18 | Discharge: 2018-01-18 | Disposition: A | Discharge status: Home / Self Care | Payer: Medicare Other | Source: Ambulatory Visit | Attending: Neurosurgery | Admitting: Neurosurgery

## 2018-01-18 ENCOUNTER — Other Ambulatory Visit: Payer: Self-pay | Admitting: Family Medicine

## 2018-01-18 DIAGNOSIS — M47814 Spondylosis without myelopathy or radiculopathy, thoracic region: Secondary | ICD-10-CM | POA: Insufficient documentation

## 2018-01-18 DIAGNOSIS — M546 Pain in thoracic spine: Secondary | ICD-10-CM | POA: Insufficient documentation

## 2018-01-18 NOTE — Telephone Encounter (Signed)
Pt contacted office for refill request on the following medications:  oxyCODONE (ROXICODONE) 15 MG immediate release tablet   [facility]  Last Rx: 12/21/17 LOV: 03/27/17 Please advise. Thanks TNP

## 2018-01-19 MED ORDER — OXYCODONE HCL 15 MG PO TABS: 15.0000 mg | ORAL_TABLET | Freq: Four times a day (QID) | ORAL | 0 refills | Status: DC | PRN

## 2018-01-25 ENCOUNTER — Ambulatory Visit (INDEPENDENT_AMBULATORY_CARE_PROVIDER_SITE_OTHER): Payer: Medicare Other | Admitting: Obstetrics and Gynecology

## 2018-01-25 VITALS — BP 133/76 | HR 85 | Wt 172.4 lb

## 2018-01-25 DIAGNOSIS — E663 Overweight: Secondary | ICD-10-CM

## 2018-01-25 NOTE — Progress Notes (Signed)
Pt presents for  Weight,B/P, B-12 injection. No side effects of medications-Phentermine or B-12. Weight loss __6__ lbs. Encourage eating healthy and exercise. Pt states pharmacist gives B12 inj.

## 2018-02-02 DIAGNOSIS — M41125 Adolescent idiopathic scoliosis, thoracolumbar region: Secondary | ICD-10-CM | POA: Diagnosis not present

## 2018-02-02 DIAGNOSIS — M47816 Spondylosis without myelopathy or radiculopathy, lumbar region: Secondary | ICD-10-CM | POA: Diagnosis not present

## 2018-02-09 ENCOUNTER — Other Ambulatory Visit: Payer: Self-pay | Admitting: Neurosurgery

## 2018-02-09 DIAGNOSIS — M41125 Adolescent idiopathic scoliosis, thoracolumbar region: Secondary | ICD-10-CM

## 2018-02-15 ENCOUNTER — Ambulatory Visit
Admission: RE | Admit: 2018-02-15 | Discharge: 2018-02-15 | Disposition: A | Discharge status: Home / Self Care | Payer: Medicare Other | Source: Ambulatory Visit | Attending: Neurosurgery | Admitting: Neurosurgery

## 2018-02-15 ENCOUNTER — Encounter: Payer: Self-pay | Admitting: Family Medicine

## 2018-02-15 DIAGNOSIS — M41125 Adolescent idiopathic scoliosis, thoracolumbar region: Secondary | ICD-10-CM

## 2018-02-15 DIAGNOSIS — M48061 Spinal stenosis, lumbar region without neurogenic claudication: Secondary | ICD-10-CM | POA: Insufficient documentation

## 2018-02-15 DIAGNOSIS — M5124 Other intervertebral disc displacement, thoracic region: Secondary | ICD-10-CM | POA: Diagnosis not present

## 2018-02-15 DIAGNOSIS — D1779 Benign lipomatous neoplasm of other sites: Secondary | ICD-10-CM | POA: Diagnosis not present

## 2018-02-15 DIAGNOSIS — M4804 Spinal stenosis, thoracic region: Secondary | ICD-10-CM | POA: Diagnosis not present

## 2018-02-15 DIAGNOSIS — M5126 Other intervertebral disc displacement, lumbar region: Secondary | ICD-10-CM | POA: Diagnosis not present

## 2018-02-16 ENCOUNTER — Ambulatory Visit: Admission: RE | Admit: 2018-02-16 | Payer: Medicare Other | Source: Ambulatory Visit

## 2018-02-18 ENCOUNTER — Other Ambulatory Visit: Payer: Self-pay

## 2018-02-18 ENCOUNTER — Telehealth: Payer: Self-pay | Admitting: *Deleted

## 2018-02-18 ENCOUNTER — Emergency Department
Admission: EM | Admit: 2018-02-18 | Discharge: 2018-02-18 | Disposition: A | Discharge status: Home / Self Care | Payer: Medicare Other | Attending: Emergency Medicine | Admitting: Emergency Medicine

## 2018-02-18 ENCOUNTER — Other Ambulatory Visit: Payer: Self-pay | Admitting: *Deleted

## 2018-02-18 ENCOUNTER — Emergency Department: Payer: Medicare Other

## 2018-02-18 ENCOUNTER — Encounter: Payer: Self-pay | Admitting: *Deleted

## 2018-02-18 DIAGNOSIS — Z7982 Long term (current) use of aspirin: Secondary | ICD-10-CM | POA: Insufficient documentation

## 2018-02-18 DIAGNOSIS — L03114 Cellulitis of left upper limb: Secondary | ICD-10-CM | POA: Diagnosis not present

## 2018-02-18 DIAGNOSIS — R079 Chest pain, unspecified: Secondary | ICD-10-CM | POA: Diagnosis not present

## 2018-02-18 DIAGNOSIS — W272XXA Contact with scissors, initial encounter: Secondary | ICD-10-CM | POA: Diagnosis not present

## 2018-02-18 DIAGNOSIS — R7989 Other specified abnormal findings of blood chemistry: Secondary | ICD-10-CM | POA: Diagnosis not present

## 2018-02-18 DIAGNOSIS — K5900 Constipation, unspecified: Secondary | ICD-10-CM | POA: Insufficient documentation

## 2018-02-18 DIAGNOSIS — R142 Eructation: Secondary | ICD-10-CM | POA: Diagnosis not present

## 2018-02-18 DIAGNOSIS — R110 Nausea: Secondary | ICD-10-CM | POA: Diagnosis not present

## 2018-02-18 DIAGNOSIS — R0789 Other chest pain: Secondary | ICD-10-CM | POA: Diagnosis not present

## 2018-02-18 DIAGNOSIS — E039 Hypothyroidism, unspecified: Secondary | ICD-10-CM | POA: Diagnosis not present

## 2018-02-18 DIAGNOSIS — Z79899 Other long term (current) drug therapy: Secondary | ICD-10-CM | POA: Insufficient documentation

## 2018-02-18 DIAGNOSIS — K8020 Calculus of gallbladder without cholecystitis without obstruction: Secondary | ICD-10-CM | POA: Diagnosis not present

## 2018-02-18 DIAGNOSIS — R202 Paresthesia of skin: Secondary | ICD-10-CM | POA: Insufficient documentation

## 2018-02-18 DIAGNOSIS — Y999 Unspecified external cause status: Secondary | ICD-10-CM | POA: Diagnosis not present

## 2018-02-18 DIAGNOSIS — S61432A Puncture wound without foreign body of left hand, initial encounter: Secondary | ICD-10-CM | POA: Insufficient documentation

## 2018-02-18 DIAGNOSIS — R945 Abnormal results of liver function studies: Secondary | ICD-10-CM | POA: Insufficient documentation

## 2018-02-18 DIAGNOSIS — Y9389 Activity, other specified: Secondary | ICD-10-CM | POA: Insufficient documentation

## 2018-02-18 DIAGNOSIS — Y929 Unspecified place or not applicable: Secondary | ICD-10-CM | POA: Diagnosis not present

## 2018-02-18 DIAGNOSIS — S6992XA Unspecified injury of left wrist, hand and finger(s), initial encounter: Secondary | ICD-10-CM | POA: Diagnosis present

## 2018-02-18 DIAGNOSIS — I10 Essential (primary) hypertension: Secondary | ICD-10-CM | POA: Insufficient documentation

## 2018-02-18 LAB — CBC
HCT: 39.1 % (ref 35.0–47.0)
Hemoglobin: 12.7 g/dL (ref 12.0–16.0)
MCH: 24.9 pg — ABNORMAL LOW (ref 26.0–34.0)
MCHC: 32.4 g/dL (ref 32.0–36.0)
MCV: 76.8 fL — ABNORMAL LOW (ref 80.0–100.0)
Platelets: 281 10*3/uL (ref 150–440)
RBC: 5.09 MIL/uL (ref 3.80–5.20)
RDW: 15 % — ABNORMAL HIGH (ref 11.5–14.5)
WBC: 7 10*3/uL (ref 3.6–11.0)

## 2018-02-18 LAB — LIPASE, BLOOD: Lipase: 30 U/L (ref 11–51)

## 2018-02-18 LAB — BASIC METABOLIC PANEL
Anion gap: 9 (ref 5–15)
BUN: 12 mg/dL (ref 8–23)
CO2: 25 mmol/L (ref 22–32)
Calcium: 8.9 mg/dL (ref 8.9–10.3)
Chloride: 103 mmol/L (ref 98–111)
Creatinine, Ser: 0.77 mg/dL (ref 0.44–1.00)
GFR calc Af Amer: >60 mL/min (ref >60)
GFR calc non Af Amer: >60 mL/min (ref >60)
Glucose, Bld: 98 mg/dL (ref 70–99)
Potassium: 3.9 mmol/L (ref 3.5–5.1)
Sodium: 137 mmol/L (ref 135–145)

## 2018-02-18 LAB — HEPATIC FUNCTION PANEL
ALT: 386 U/L — ABNORMAL HIGH (ref 0–44)
AST: 291 U/L — ABNORMAL HIGH (ref 15–41)
Albumin: 3.4 g/dL — ABNORMAL LOW (ref 3.5–5.0)
Alkaline Phosphatase: 382 U/L — ABNORMAL HIGH (ref 38–126)
Bilirubin, Direct: 0.3 mg/dL — ABNORMAL HIGH (ref 0.0–0.2)
Indirect Bilirubin: 0.7 mg/dL (ref 0.3–0.9)
Total Bilirubin: 1 mg/dL (ref 0.3–1.2)
Total Protein: 6.6 g/dL (ref 6.5–8.1)

## 2018-02-18 LAB — TROPONIN I: Troponin I: <0.03 ng/mL (ref <0.03)

## 2018-02-18 MED ORDER — AMOXICILLIN-POT CLAVULANATE 875-125 MG PO TABS
ORAL_TABLET | ORAL | Status: AC
  Administered 2018-02-18: 1 via ORAL
  Filled 2018-02-18: qty 1

## 2018-02-18 MED ORDER — ONDANSETRON 4 MG PO TBDP
ORAL_TABLET | ORAL | Status: AC
  Filled 2018-02-18: qty 1

## 2018-02-18 MED ORDER — AMOXICILLIN-POT CLAVULANATE 875-125 MG PO TABS: 1.0000 | ORAL_TABLET | Freq: Two times a day (BID) | ORAL | 0 refills | Status: AC

## 2018-02-18 MED ORDER — ONDANSETRON 4 MG PO TBDP: 4.0000 mg | ORAL_TABLET | Freq: Three times a day (TID) | ORAL | 0 refills | Status: DC | PRN

## 2018-02-18 MED ORDER — AMOXICILLIN-POT CLAVULANATE 875-125 MG PO TABS
1.0000 | ORAL_TABLET | Freq: Once | ORAL | Status: AC
  Administered 2018-02-18: 1 via ORAL

## 2018-02-18 MED ORDER — TETANUS-DIPHTH-ACELL PERTUSSIS 5-2.5-18.5 LF-MCG/0.5 IM SUSP: 0.5000 mL | Freq: Once | INTRAMUSCULAR | Status: DC

## 2018-02-18 MED ORDER — TETANUS-DIPHTH-ACELL PERTUSSIS 5-2.5-18.5 LF-MCG/0.5 IM SUSP
INTRAMUSCULAR | Status: AC
  Filled 2018-02-18: qty 0.5

## 2018-02-18 MED ORDER — OXYCODONE HCL 15 MG PO TABS: 15.0000 mg | ORAL_TABLET | Freq: Four times a day (QID) | ORAL | 0 refills | Status: DC | PRN

## 2018-02-18 MED ORDER — ONDANSETRON 4 MG PO TBDP
4.0000 mg | ORAL_TABLET | Freq: Once | ORAL | Status: AC
  Administered 2018-02-18: 4 mg via ORAL

## 2018-02-18 NOTE — Discharge Instructions (Signed)
As we discussed please follow-up with your primary care doctor and have your liver function test rechecked in 1 to 2 weeks.  Please avoid all alcohol and Tylenol products for the next several weeks.  Return to the emergency department for any development of fever or right upper quadrant abdominal pain.  Otherwise please also follow-up with your primary care doctor regarding your intermittent chest pain your work-up was largely normal regarding chest discomfort.  Please take your antibiotics as prescribed for the likely left hand infection for their entire course.  Return to the emergency department for any significant increase in swelling or discomfort.

## 2018-02-18 NOTE — Telephone Encounter (Signed)
Patient called office with complaints of almost constant nausea for about 2 weeks. Patient stated that 4 days ago she had sudden shoulder pain and slight shortness of breath. Patient stated these symptoms have reoccurred several times over the last 4 days. Advised patient to go to ER due to suddenness of symptoms. Patient stated that she will go to ER.

## 2018-02-18 NOTE — ED Provider Notes (Signed)
[facility] Emergency Department Provider Note  Time seen: 2:42 PM  I have reviewed the triage vital signs and the nursing notes.   HISTORY  Chief Complaint Chest Pain    HPI Courtney Ross is a 62 y.o. female with a past medical history of anxiety, chronic pain with an implantable pain control device, gastric reflux, fatty liver, hypertension, hyperlipidemia, presents to the emergency department for intermittent chest pain and nausea.  According to the patient over the past 2 weeks she has been intermittently nauseated with occasional pressure type pain in her lower chest.  States this has been ongoing intermittently over the past 2 weeks, none currently.  Also states over the past 2 weeks she has been belching a lot.  Denies any abdominal pain, or diarrhea.  Does have issues with constipation she usually takes MiraLAX and Colace.  No fever.  Patient called her primary care doctor who wanted her sent here for evaluation to make sure her heart was okay.  Patient denies any cardiac history has never seen a cardiologist.  As a secondary complaint the patient also accidentally stabbed herself in the left hand with a pair of scissors while attempting to open something yesterday.  Today she states redness to the area and pain to the area.   Past Medical History:  Diagnosis Date  . Anxiety   . Back pain   . Constipation   . Degenerative disc disease, lumbar   . Depression   . Family history of adverse reaction to anesthesia    daughter nausea  . Fatty liver   . GERD (gastroesophageal reflux disease)   . Headache    migraines  . Headache, migraine 05/08/2015  . High cholesterol   . Hypertension   . Lower back pain   . Polycystic ovarian disease   . PONV (postoperative nausea and vomiting)   . Restless leg syndrome   . Spasm 01/29/2009    Patient Active Problem List   Diagnosis Date Noted  . External hemorrhoids without complication [id number]  . Dyspareunia  in female 12/10/2017  . Vaginal pain 12/31/2016  . Rectocele 04/11/2016  . Cystocele, midline 04/11/2016  . Status post abdominal hysterectomy 04/10/2016  . Menopause 04/10/2016  . Essential hypertension 05/29/2015  . 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 Chronic pain of lower extremity (Right side) 05/09/2015  . Chronic lumbar radicular pain (Right side) 05/09/2015  . Chronic right groin pain 05/09/2015  . Complication of implanted electronic neurostimulator of spinal cord ([employer]) (battery depletion) 05/09/2015  . 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 HCV antibody positive 05/08/2015  . H/O adenomatous polyp of colon 05/08/2015  . Headache, migraine 05/08/2015  . Restless legs syndrome 05/08/2015  . Rotator cuff syndrome of shoulder and allied disorders 05/08/2015  . Hand tendinitis 05/08/2015  . Anxiety 12/12/2014  . Chronic pain associated with significant psychosocial dysfunction 11/22/2014  . 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 Atypical squamous cells of undetermined significance on cytologic smear of vagina (ASC-US) 11/22/2014  . Snapping thumb syndrome 11/22/2014  . Menopausal and perimenopausal disorder 12/03/2009  . Hernia, inguinal, unilateral 05/01/2009  . Dyslipidemia 02/25/2007  . Degeneration of intervertebral disc of lumbosacral region 08/19/2006  . Degeneration of lumbar or lumbosacral intervertebral disc 08/19/2006  . Major depressive disorder with single episode 04/16/2006  . Fatty liver 06/23/2005    Past Surgical History:  Procedure Laterality Date  . [place]  . APPENDECTOMY  1988  . ARTHRODESIS METATARSALPHALANGEAL JOINT (MTPJ) Left 12/14/2014   Procedure: ARTHRODESIS METATARSALPHALANGEAL JOINT (MTPJ) 1ST;  Surgeon: Albertine Patricia, DPM;  Location: [place];  Service: Podiatry;  Laterality: Left;  LMA  . BACK SURGERY  [phone]    x3  . BLADDER SUSPENSION  2006  . BREAST BIOPSY Left 1990's  . BREAST SURGERY  2005   lumpectomy  . CARPAL TUNNEL RELEASE Bilateral 02/26/07,05/21/07  . COLPORRHAPHY    . DILATION AND CURETTAGE OF UTERUS    . [place]  . FOOT SURGERY    . HAMMER TOE SURGERY Left 03/22/2015   Procedure: HAMMER TOE CORRECTION L 2ND AND 3RD ;  Surgeon: Albertine Patricia, DPM;  Location: [place];  Service: Podiatry;  Laterality: Left;  WITH LOCAL  . HERNIA REPAIR  1988  . NECK SURGERY  2006   fusion  . RECTOCELE REPAIR N/A 08/25/2016   Procedure: POSTERIOR REPAIR (RECTOCELE);  Surgeon: Brayton Mars, MD;  Location: [facility];  Service: Gynecology;  Laterality: N/A;  . SPINAL CORD STIMULATOR IMPLANT    . TONSILLECTOMY  1985    Prior to Admission medications   Medication Sig Start Date End Date Taking? Authorizing Provider  aspirin EC 81 MG tablet Take 81 mg by mouth at bedtime.    [provider]  buPROPion (WELLBUTRIN SR) 150 MG 12 hr tablet TAKE 1 TABLET BY MOUTH TWO TIMES DAILY 04/28/17   Birdie Sons, MD  Cholecalciferol (VITAMIN D3) 5000 UNITS CAPS Take 5,000 Units by mouth daily.     [provider]  cyanocobalamin (,VITAMIN B-12,) 1000 MCG/ML injection Inject 1 mL (1,000 mcg total) into the muscle every 30 (thirty) days. 01/13/18   Shambley, Melody N, CNM  cyclobenzaprine (FLEXERIL) 10 MG tablet Take 10 mg by mouth 3 (three) times daily as needed for muscle spasms.    [provider]  diazepam (VALIUM) 10 MG tablet Take 1 tablet (10 mg total) by mouth every 6 (six) hours as needed for anxiety. 12/14/17   Birdie Sons, MD  estradiol (ESTRACE) 1 MG tablet TAKE ONE TABLET BY MOUTH EVERY DAY 01/14/18   Birdie Sons, MD  linaclotide [facility]) 145 MCG CAPS capsule Take 1 capsule (145 mcg total) by mouth daily. 03/23/17   Birdie Sons, MD  lubiprostone (AMITIZA) 24 MCG capsule Take 1 capsule (24 mcg total) by mouth 2 (two) times daily  with a meal. 01/01/18   Birdie Sons, MD  LYRICA 75 MG capsule TAKE 1 CAPSULE BY MOUTH THREE TIMES DAILY 12/03/17   Birdie Sons, MD  meloxicam (MOBIC) 15 MG tablet Take 15 mg by mouth daily.  05/11/15   [provider]  mirabegron ER (MYRBETRIQ) 25 MG TB24 tablet Take 1 tablet (25 mg total) by mouth daily. 12/10/17   Defrancesco, Alanda Slim, MD  Multiple Vitamin (MULTIVITAMIN WITH MINERALS) TABS tablet Take 1 tablet by mouth daily.    [provider]  mupirocin cream (BACTROBAN) 2 % Apply 1 application topically 2 (two) times daily. Patient not taking: Reported on 12/30/2017 07/22/17   Birdie Sons, MD  oxyCODONE (ROXICODONE) 15 MG immediate release tablet Take 1-2 tablets (15-30 mg total) by mouth 4 (four) times daily as needed. 02/18/18   Birdie Sons, MD  phentermine 15 MG capsule Take 1 capsule (15 mg total) by mouth every morning. 12/30/17   Shambley, Melody N, CNM  polyethylene glycol powder (GLYCOLAX/MIRALAX) powder  Take 17 g by mouth 2 (two) times daily as needed. 07/04/16   Birdie Sons, MD  RESTASIS 0.05 % ophthalmic emulsion Apply 1 drop to eye 2 (two) times daily. 11/21/15   [provider]  rOPINIRole (REQUIP) 1 MG tablet Take 1 tablet (1 mg total) by mouth at bedtime. 08/14/17   Birdie Sons, MD    Allergies  Allergen Reactions  . Hydrocodone Itching  . Lisinopril Cough  . Mirtazapine     Weight gain    Family History  Problem Relation Age of Onset  . Diabetes Father   . Hypertension Father   . CAD Father   . Diabetes Sister        non-Insulin dependent diabetes mellitus  . Heart disease Brother   . Cancer Daughter        cancer of the cervix at age 11-18  . Heart disease Brother   . Breast cancer Paternal Aunt   . Colon cancer Paternal Uncle   . Ovarian cancer Neg Hx     Social History Social History   Tobacco Use  . Smoking status: Never Smoker  . Smokeless tobacco: Never Used  Substance Use Topics  . Alcohol use: No     Alcohol/week: 0.0 standard drinks    Frequency: Never  . Drug use: No    Review of Systems Constitutional: Negative for fever. Cardiovascular: Intermittent chest pain/pressure Respiratory: Negative for shortness of breath. Gastrointestinal: Negative for abdominal pain, vomiting and diarrhea.  Mild constipation.  Positive for belching. Genitourinary: Negative for urinary compaints Musculoskeletal: Puncture wounds to left hand where the patient excellently stepped herself with scissors yesterday. Skin: Redness swelling to left hand. Neurological: Negative for headache.  Patient was feeling tingling sensations in her left arm which have since resolved. All other ROS negative  ____________________________________________   PHYSICAL EXAM:  VITAL SIGNS: ED Triage Vitals  Enc Vitals Group     BP 02/18/18 1343 128/80     Pulse Rate 02/18/18 1343 86     Resp 02/18/18 1343 16     Temp 02/18/18 1343 98 F (36.7 C)     Temp Source 02/18/18 1343 Oral     SpO2 02/18/18 1343 100 %     Weight 02/18/18 1339 171 lb (77.6 kg)     Height 02/18/18 1339 5\' 8"  (1.727 m)     Head Circumference --      Peak Flow --      Pain Score 02/18/18 1339 4     Pain Loc --      Pain Edu? --      Excl. in [place]? --    Constitutional: Alert and oriented. Well appearing and in no distress. Eyes: Normal exam ENT   Head: Normocephalic and atraumatic.   Mouth/Throat: Mucous membranes are moist. Cardiovascular: Normal rate, regular rhythm. Respiratory: Normal respiratory effort without tachypnea nor retractions. Breath sounds are clear Gastrointestinal: Soft and nontender. No distention.   Musculoskeletal: Patient does have 2 small puncture wounds to the dorsal aspect of the left hand near the thumb.  Patient does have mild erythema and swelling surrounding this with mild tenderness to the area. Neurologic:  Normal speech and language. No gross focal neurologic deficits  Skin:  Skin is warm, dry and  intact.  Mild erythema around puncture site in left hand Psychiatric: Mood and affect are normal.   ____________________________________________    EKG  EKG reviewed and interpreted by myself shows normal sinus rhythm 86 bpm with a  narrow QRS, normal axis, normal intervals, no concerning ST changes.  ____________________________________________    RADIOLOGY  Chest x-ray negative  ____________________________________________   INITIAL IMPRESSION / ASSESSMENT AND PLAN / ED COURSE  Pertinent labs & imaging results that were available during my care of the patient were reviewed by me and considered in my medical decision making (see chart for details).  Patient presents to the emergency department for intermittent nausea and lower chest pressure over the past 2 weeks.  Differential would include ACS, pancreatitis, fatty liver or gallbladder dysfunction, gas pains, hiatal hernia.  Overall the patient appears very well, no distress and no discomfort currently.  Overall very normal physical exam besides in the left hand she does have 2 small puncture wounds with mild to moderate erythema surrounding the wound with tenderness mild edema.  Unknown last tetanus shot we will update in the emergency department, will place on antibiotics.  I discussed very strict return precautions for any significant swelling or pain in the hand otherwise she will follow-up with her doctor.  Patient's labs are resulted largely within normal limits including negative troponin reassuring chest x-ray and EKG.  As the pain is been ongoing for 2 weeks that this makes ACS very unlikely.  However we will add on hepatic function panel and lipase as a precaution.  Overall the patient appears well and anticipate likely discharge home with PCP follow-up.  Pharmacy states they have record of the patient receiving a tetanus shot in 2017.  Patient's work-up shows a normal lipase but she does have fairly significantly elevated  LFTs.  In reviewing the patient's records today.  Largely been normal in the past.  Given her elevated LFTs we will obtain a right upper quadrant ultrasound and continue to closely monitor.  Patient agreeable to plan of care.  Patient's ultrasound shows gallstones without signs of cholecystitis.  Patient has no right upper quadrant pain.  We will discharge home with general surgery follow-up to discuss possible elective cholecystectomy.  Also discussed PCP follow-up to have her LFTs rechecked.  Patient agreeable to plan of care.  Discussed return precautions for any right upper quadrant abdominal pain or development of fever.  ____________________________________________   FINAL CLINICAL IMPRESSION(S) / ED DIAGNOSES  Chest pain Puncture wound Elevated LFTs.   Harvest Dark, MD 02/18/18 1810

## 2018-02-18 NOTE — ED Triage Notes (Signed)
Pt to ED reporting intermittent nausea and chest pressure for over a week. No vomiting reported. Pain and numbness in left arm at this time as well as two puncture wounds on left hand where pt reports she stabbed herself by accident with scissors. Redness and swelling noted to the area. No SOB,lightheadedness or dizziness. No cardiac hx.

## 2018-02-18 NOTE — ED Notes (Signed)
Pt returned from US

## 2018-02-19 ENCOUNTER — Telehealth: Payer: Self-pay | Admitting: Family Medicine

## 2018-02-19 MED ORDER — FLUCONAZOLE 150 MG PO TABS: 150.0000 mg | ORAL_TABLET | Freq: Once | ORAL | 0 refills | Status: AC

## 2018-02-19 NOTE — Telephone Encounter (Signed)
Please advise 

## 2018-02-19 NOTE — Telephone Encounter (Signed)
Patient was advised.  

## 2018-02-19 NOTE — Telephone Encounter (Signed)
Pt states she would like a RX of Diflucan (x2) sent into [facility].  States she went to the ED yesterday and was given antibiotic.  States ED doctor was to give her Diflucan and forgot to.

## 2018-02-19 NOTE — Telephone Encounter (Signed)
Prescription has been sent.

## 2018-02-23 ENCOUNTER — Ambulatory Visit (INDEPENDENT_AMBULATORY_CARE_PROVIDER_SITE_OTHER): Payer: Medicare Other | Admitting: Obstetrics and Gynecology

## 2018-02-23 ENCOUNTER — Encounter: Payer: Self-pay | Admitting: Obstetrics and Gynecology

## 2018-02-23 VITALS — BP 140/86 | HR 95 | Ht 68.0 in | Wt 168.6 lb

## 2018-02-23 DIAGNOSIS — E663 Overweight: Secondary | ICD-10-CM

## 2018-02-23 MED ORDER — CYANOCOBALAMIN 1000 MCG/ML IJ SOLN
1000.0000 ug | Freq: Once | INTRAMUSCULAR | Status: AC
  Administered 2018-02-23: 1000 ug via INTRAMUSCULAR

## 2018-02-23 NOTE — Progress Notes (Signed)
Pt is here for wt, bp check, b-12 inj She is doing great!!! She is very happy with her wt loss, working hard, and exercising  02/23/18 wt- 168.6lb 12/30/17 wt- 178lb

## 2018-03-04 ENCOUNTER — Ambulatory Visit (INDEPENDENT_AMBULATORY_CARE_PROVIDER_SITE_OTHER): Payer: Medicare Other | Admitting: Surgery

## 2018-03-04 ENCOUNTER — Encounter: Payer: Self-pay | Admitting: Surgery

## 2018-03-04 ENCOUNTER — Other Ambulatory Visit
Admission: RE | Admit: 2018-03-04 | Discharge: 2018-03-04 | Disposition: A | Discharge status: Home / Self Care | Payer: Medicare Other | Source: Ambulatory Visit | Attending: Surgery | Admitting: Surgery

## 2018-03-04 ENCOUNTER — Telehealth: Payer: Self-pay | Admitting: *Deleted

## 2018-03-04 VITALS — BP 128/82 | HR 87 | Temp 98.1°F | Resp 18 | Ht 67.5 in | Wt 169.0 lb

## 2018-03-04 DIAGNOSIS — M25519 Pain in unspecified shoulder: Secondary | ICD-10-CM | POA: Insufficient documentation

## 2018-03-04 DIAGNOSIS — R1011 Right upper quadrant pain: Secondary | ICD-10-CM | POA: Diagnosis not present

## 2018-03-04 DIAGNOSIS — S93409A Sprain of unspecified ligament of unspecified ankle, initial encounter: Secondary | ICD-10-CM | POA: Insufficient documentation

## 2018-03-04 DIAGNOSIS — M7550 Bursitis of unspecified shoulder: Secondary | ICD-10-CM

## 2018-03-04 DIAGNOSIS — M719 Bursopathy, unspecified: Secondary | ICD-10-CM | POA: Insufficient documentation

## 2018-03-04 DIAGNOSIS — M5412 Radiculopathy, cervical region: Secondary | ICD-10-CM | POA: Insufficient documentation

## 2018-03-04 DIAGNOSIS — M7520 Bicipital tendinitis, unspecified shoulder: Secondary | ICD-10-CM | POA: Insufficient documentation

## 2018-03-04 LAB — COMPREHENSIVE METABOLIC PANEL
ALT: 23 U/L (ref 0–44)
AST: 23 U/L (ref 15–41)
Albumin: 3.9 g/dL (ref 3.5–5.0)
Alkaline Phosphatase: 118 U/L (ref 38–126)
Anion gap: 7 (ref 5–15)
BUN: 20 mg/dL (ref 8–23)
CO2: 27 mmol/L (ref 22–32)
Calcium: 9 mg/dL (ref 8.9–10.3)
Chloride: 107 mmol/L (ref 98–111)
Creatinine, Ser: 1.02 mg/dL — ABNORMAL HIGH (ref 0.44–1.00)
GFR calc Af Amer: >60 mL/min (ref >60)
GFR calc non Af Amer: 58 mL/min — ABNORMAL LOW (ref >60)
Glucose, Bld: 94 mg/dL (ref 70–99)
Potassium: 4.4 mmol/L (ref 3.5–5.1)
Sodium: 141 mmol/L (ref 135–145)
Total Bilirubin: 0.5 mg/dL (ref 0.3–1.2)
Total Protein: 7.4 g/dL (ref 6.5–8.1)

## 2018-03-04 NOTE — Telephone Encounter (Signed)
Patient was just seen today and was told to pick up some Prilosec. She stated that she went to the pharmacy to pick it up and they told her that if she gets a prescription for it then her insurance would pay for it. [facility].

## 2018-03-04 NOTE — Progress Notes (Signed)
Surgical Clinic History and Physical  Referring provider:  Birdie Sons, MD [address] [place], [place] [address]  HISTORY OF PRESENT ILLNESS (HPI):  62 y.o. female presents for evaluation of abdominal pain. Patient reports she began to experience epigastric abdominal pain after eating over the past month, but not specifically with fatty foods, and frequent belching over the same period of time with intermittent nausea without emesis. She also describes chronic severe GERD, for which she's only ever taken prn Tums, and chronic constipation, for which she takes Colace and prn Miralax. She was recently referred to [facility] ED by primary care physician for reported chest pain and underwent unremarkable cardiac workup. Patient otherwise reports +flatus, denies any recent sick contacts or fever/chills and says she only drinks bottled spring water due to contaminated/poor quality well water at home with high mineral content to which patient attributes her husband's kidney stones.  PAST MEDICAL HISTORY (PMH):  Past Medical History:  Diagnosis Date  . Anxiety   . Back pain   . Constipation   . Degenerative disc disease, lumbar   . Depression   . Family history of adverse reaction to anesthesia    daughter nausea  . Fatty liver   . GERD (gastroesophageal reflux disease)   . Headache    migraines  . Headache, migraine 05/08/2015  . High cholesterol   . Hypertension   . Lower back pain   . Polycystic ovarian disease   . PONV (postoperative nausea and vomiting)   . Restless leg syndrome   . Spasm 01/29/2009     PAST SURGICAL HISTORY [facility]):  Past Surgical History:  Procedure Laterality Date  . [place]  . APPENDECTOMY  1988  . ARTHRODESIS METATARSALPHALANGEAL JOINT (MTPJ) Left 12/14/2014   Procedure: ARTHRODESIS METATARSALPHALANGEAL JOINT (MTPJ) 1ST;  Surgeon: Albertine Patricia, DPM;  Location: [place];  Service: Podiatry;  Laterality: Left;   LMA  . BACK SURGERY  [phone]   x3  . BLADDER SUSPENSION  2006  . BREAST BIOPSY Left 1990's  . BREAST SURGERY  2005   lumpectomy  . CARPAL TUNNEL RELEASE Bilateral 02/26/07,05/21/07  . COLPORRHAPHY    . DILATION AND CURETTAGE OF UTERUS    . [place]  . FOOT SURGERY    . HAMMER TOE SURGERY Left 03/22/2015   Procedure: HAMMER TOE CORRECTION L 2ND AND 3RD ;  Surgeon: Albertine Patricia, DPM;  Location: [place];  Service: Podiatry;  Laterality: Left;  WITH LOCAL  . HERNIA REPAIR  1988  . NECK SURGERY  2006   fusion  . RECTOCELE REPAIR N/A 08/25/2016   Procedure: POSTERIOR REPAIR (RECTOCELE);  Surgeon: Brayton Mars, MD;  Location: [facility] ORS;  Service: Gynecology;  Laterality: N/A;  . SPINAL CORD STIMULATOR IMPLANT    . TONSILLECTOMY  1985     MEDICATIONS:  Prior to Admission medications   Medication Sig Start Date End Date Taking? Authorizing Provider  aspirin EC 81 MG tablet Take 81 mg by mouth at bedtime.   Yes [provider]  buPROPion (WELLBUTRIN SR) 150 MG 12 hr tablet TAKE 1 TABLET BY MOUTH TWO TIMES DAILY 04/28/17  Yes Birdie Sons, MD  Cholecalciferol (VITAMIN D3) 5000 UNITS CAPS Take 5,000 Units by mouth daily.    Yes [provider]  cyanocobalamin (,VITAMIN B-12,) 1000 MCG/ML injection Inject 1 mL (1,000 mcg total) into the muscle every 30 (thirty) days. 01/13/18  Yes Shambley,  Melody N, CNM  Cyanocobalamin (B-12 COMPLIANCE INJECTION IJ) cyanocobalamin (vit B-12) 1,000 mcg/mL injection solution  INJECT 1ML INTO MUSCLE EVERY 30 DAYS   Yes [provider]  cyclobenzaprine (FLEXERIL) 10 MG tablet Take 10 mg by mouth 3 (three) times daily as needed for muscle spasms.   Yes [provider]  diazepam (VALIUM) 10 MG tablet Take 1 tablet (10 mg total) by mouth every 6 (six) hours as needed for anxiety. 12/14/17  Yes Birdie Sons, MD  estradiol (ESTRACE) 1 MG tablet TAKE ONE TABLET BY MOUTH EVERY DAY  01/14/18  Yes Birdie Sons, MD  hydroquinone 4 % cream APPLY TO DARK SPOTS DAILY 01/04/18  Yes [provider]  linaclotide (LINZESS) 145 MCG CAPS capsule Take 1 capsule (145 mcg total) by mouth daily. 03/23/17  Yes Birdie Sons, MD  lubiprostone (AMITIZA) 24 MCG capsule Take 1 capsule (24 mcg total) by mouth 2 (two) times daily with a meal. 01/01/18  Yes Birdie Sons, MD  LYRICA 75 MG capsule TAKE 1 CAPSULE BY MOUTH THREE TIMES DAILY 12/03/17  Yes Birdie Sons, MD  meloxicam (MOBIC) 15 MG tablet Take 15 mg by mouth daily.  05/11/15  Yes [provider]  mirabegron ER (MYRBETRIQ) 25 MG TB24 tablet Take 1 tablet (25 mg total) by mouth daily. 12/10/17  Yes Defrancesco, Alanda Slim, MD  Multiple Vitamin (MULTIVITAMIN WITH MINERALS) TABS tablet Take 1 tablet by mouth daily.   Yes [provider]  mupirocin cream (BACTROBAN) 2 % Apply 1 application topically 2 (two) times daily. 07/22/17  Yes Birdie Sons, MD  ondansetron (ZOFRAN ODT) 4 MG disintegrating tablet Take 1 tablet (4 mg total) by mouth every 8 (eight) hours as needed for nausea or vomiting. 02/18/18  Yes Harvest Dark, MD  oxyCODONE (ROXICODONE) 15 MG immediate release tablet Take 1-2 tablets (15-30 mg total) by mouth 4 (four) times daily as needed. 02/18/18  Yes Birdie Sons, MD  phentermine 15 MG capsule Take 1 capsule (15 mg total) by mouth every morning. 12/30/17  Yes Shambley, Melody N, CNM  RESTASIS 0.05 % ophthalmic emulsion Apply 1 drop to eye 2 (two) times daily. 11/21/15  Yes [provider]  rOPINIRole (REQUIP) 1 MG tablet Take 1 tablet (1 mg total) by mouth at bedtime. 08/14/17  Yes Birdie Sons, MD     ALLERGIES:  Allergies  Allergen Reactions  . Hydrocodone Itching  . Lisinopril Cough  . Mirtazapine     Weight gain     SOCIAL HISTORY:  Social History   Socioeconomic History  . Marital status: Legally Separated    Spouse name: Not on file  . Number of children: 4   . Years of education: Not on file  . Highest education level: 9th grade  Occupational History  . Occupation: disabled  Social Needs  . Financial resource strain: Hard  . Food insecurity:    Worry: Sometimes true    Inability: Sometimes true  . Transportation needs:    Medical: No    Non-medical: No  Tobacco Use  . Smoking status: Never Smoker  . Smokeless tobacco: Never Used  Substance and Sexual Activity  . Alcohol use: No    Alcohol/week: 0.0 standard drinks    Frequency: Never  . Drug use: No  . Sexual activity: Yes    Birth control/protection: Surgical  Lifestyle  . Physical activity:    Days per week: Not on file    Minutes per session:  Not on file  . Stress: Not at all  Relationships  . Social connections:    Talks on phone: Not on file    Gets together: Not on file    Attends religious service: Not on file    Active member of club or organization: Not on file    Attends meetings of clubs or organizations: Not on file    Relationship status: Not on file  . Intimate partner violence:    Fear of current or ex partner: No    Emotionally abused: No    Physically abused: No    Forced sexual activity: No  Other Topics Concern  . Not on file  Social History Narrative   Pt is currently receiving assistance from [employer] and gets food stamps.     The patient currently resides (home / rehab facility / nursing home): Home The patient normally is (ambulatory / bedbound): Ambulatory  FAMILY HISTORY:  Family History  Problem Relation Age of Onset  . Diabetes Father   . Hypertension Father   . CAD Father   . Diabetes Sister        non-Insulin dependent diabetes mellitus  . Heart disease Brother   . Cancer Daughter        cancer of the cervix at age 35-18  . Heart disease Brother   . Breast cancer Paternal Aunt   . Colon cancer Paternal Uncle   . Ovarian cancer Neg Hx     Otherwise negative/non-contributory.  REVIEW OF SYSTEMS:  Constitutional: denies any other  weight loss, fever, chills, or sweats  Eyes: denies any other vision changes, history of eye injury  ENT: denies sore throat, hearing problems  Respiratory: denies shortness of breath, wheezing  Cardiovascular: denies chest pain, palpitations  Gastrointestinal: abdominal pain, N/V, and bowel function as per HPI Musculoskeletal: chronic lower back pain, denies any other joint pains or cramps  Skin: Denies any other rashes or skin discolorations Neurological: denies any other headache, dizziness, weakness  Psychiatric: Denies any other depression, anxiety   All other review of systems were otherwise negative   VITAL SIGNS:  BP 128/82   Pulse 87   Temp 98.1 F (36.7 C) (Temporal)   Resp 18   Ht 5' 7.5" (1.715 m)   Wt 169 lb (76.7 kg)   SpO2 98%   BMI 26.08 kg/m   PHYSICAL EXAM:  Constitutional:  -- Normal body habitus  -- Awake, alert, and oriented x3  Eyes:  -- Pupils equally round and reactive to light  -- No scleral icterus  Ear, nose, throat:  -- No jugular venous distension -- No nasal drainage, bleeding Pulmonary:  -- No crackles  -- Equal breath sounds bilaterally -- Breathing non-labored at rest Cardiovascular:  -- S1, S2 present  -- No pericardial rubs  Gastrointestinal:  -- Abdomen soft, mild epigastric tenderness to palpation, non-distended, no guarding/rebound  -- No abdominal masses appreciated, pulsatile or otherwise  Musculoskeletal and Integumentary:  -- Wounds or skin discoloration: None appreciated -- Extremities: B/L UE and LE FROM, hands and feet warm, no edema  Neurologic:  -- Motor function: Intact and symmetric -- Sensation: Intact and symmetric  Labs:  CBC Latest Ref Rng & Units 02/18/2018 08/26/2016 08/20/2016  WBC 3.6 - 11.0 K/uL 7.0 - 6.2  Hemoglobin 12.0 - 16.0 g/dL 12.7 9.9(L) 13.1  Hematocrit 35.0 - 47.0 % 39.1 - 38.8  Platelets 150 - 440 K/uL 281 - 331   CMP Latest Ref Rng & Units 03/04/2018 02/18/2018  01/30/2017  Glucose 70 - 99 mg/dL  94 98 95  BUN 8 - 23 mg/dL 20 12 15   Creatinine 0.44 - 1.00 mg/dL 1.02(H) 0.77 0.82  Sodium 135 - 145 mmol/L 141 137 139  Potassium 3.5 - 5.1 mmol/L 4.4 3.9 4.3  Chloride 98 - 111 mmol/L 107 103 101  CO2 22 - 32 mmol/L 27 25 24   Calcium 8.9 - 10.3 mg/dL 9.0 8.9 9.1  Total Protein 6.5 - 8.1 g/dL 7.4 6.6 6.6  Total Bilirubin 0.3 - 1.2 mg/dL 0.5 1.0 0.3  Alkaline Phos 38 - 126 U/L 118 382(H) 110  AST 15 - 41 U/L 23 291(H) 18  ALT 0 - 44 U/L 23 386(H) 18    Imaging studies:  Limited RUQ Abdominal Ultrasound (02/18/2018) Multiple punctate echogenic gallstones. No gallbladder wall thickening or pericholecystic fluid. No sonographic Murphy sign elicited. Common bile duct diameter measures 5 mm.  Assessment/Plan:  62 y.o. female with post-prandial abdominal pain x 1 month and cholelithiasis with elevated transaminases and alkaline phosphatase, but bilirubin WNL, complicated by co-morbidities including untreated frequent severe GERD with increased belching along with abdominal pain, HTN, hypercholesterolemia, high-dose narcotics for chronic lower back pain, chronic constipation, generalized anxiety disorder, major depression disorder, and restless legs syndrome.   - repeat CMP  - Prilosec once daily, not prn  - Keep log of foods that exacerbate pain  - discussed cholecystectomy for symptomatic cholelithiasis  - instructed to call if any questions or concerns  - return to clinic in 2 weeks  All of the above recommendations were discussed with the patient, and all of patient's questions were answered to her expressed satisfaction.  Thank you for the opportunity to participate in this patient's care.  -- Marilynne Drivers Rosana Hoes, MD, [place]: [facility] - Partnering for exceptional care. Office: [phone]

## 2018-03-04 NOTE — Patient Instructions (Addendum)
Please stop by the lab today for blood work..    You may try Prilosec for your Heartburn symptoms.   Please keep a diary of your diet and be sure to note if you have any pain or discomfort afterwards. Please hold NSAIDS at this time and try tylenol instead.   Please see your follow up appointment listed below.    Low-Fat Diet for Gallbladder Conditions A low-fat diet can be helpful if you have pancreatitis or a gallbladder condition. With these conditions, your pancreas and gallbladder have trouble digesting fats. A healthy eating plan with less fat will help rest your pancreas and gallbladder and reduce your symptoms. WHAT DO I NEED TO KNOW ABOUT THIS DIET?  Eat a low-fat diet.  Reduce your fat intake to less than 20-30% of your total daily calories. This is less than 50-60 g of fat per day.  Remember that you need some fat in your diet. Ask your dietician what your daily goal should be.  Choose nonfat and low-fat healthy foods. Look for the words "nonfat," "low fat," or "fat free."  As a guide, look on the label and choose foods with less than 3 g of fat per serving. Eat only one serving.  Avoid alcohol.  Do not smoke. If you need help quitting, talk with your health care provider.  Eat small frequent meals instead of three large heavy meals. WHAT FOODS CAN I EAT? Grains Include healthy grains and starches such as potatoes, wheat bread, fiber-rich cereal, and brown rice. Choose whole grain options whenever possible. In adults, whole grains should account for 45-65% of your daily calories.  Fruits and Vegetables Eat plenty of fruits and vegetables. Fresh fruits and vegetables add fiber to your diet. Meats and Other Protein Sources Eat lean meat such as chicken and pork. Trim any fat off of meat before cooking it. Eggs, fish, and beans are other sources of protein. In adults, these foods should account for 10-35% of your daily calories. Dairy Choose low-fat milk and dairy  options. Dairy includes fat and protein, as well as calcium.  Fats and Oils Limit high-fat foods such as fried foods, sweets, baked goods, sugary drinks.  Other Creamy sauces and condiments, such as mayonnaise, can add extra fat. Think about whether or not you need to use them, or use smaller amounts or low fat options. WHAT FOODS ARE NOT RECOMMENDED?  High fat foods, such as:  [employer].  Ice cream.  [place] toast.  Sweet rolls.  Pizza.  Cheese bread.  Foods covered with batter, butter, creamy sauces, or cheese.  Fried foods.  Sugary drinks and desserts.  Foods that cause gas or bloating   This information is not intended to replace advice given to you by your health care provider. Make sure you discuss any questions you have with your health care provider.   Document Released: 06/14/2013 Document Reviewed: 06/14/2013 Elsevier Interactive Patient Education [employer].      Cholelithiasis Cholelithiasis is also called "gallstones." It is a kind of gallbladder disease. The gallbladder is an organ that stores a liquid (bile) that helps you digest fat. Gallstones may not cause symptoms (may be silent gallstones) until they cause a blockage, and then they can cause pain (gallbladder attack). Follow these instructions at home:  Take over-the-counter and prescription medicines only as told by your doctor.  Stay at a healthy weight.  Eat healthy foods. This includes: ? Eating fewer fatty foods, like fried foods. ? Eating fewer refined  carbs (refined carbohydrates). Refined carbs are breads and grains that are highly processed, like white bread and white rice. Instead, choose whole grains like whole-wheat bread and brown rice. ? Eating more fiber. Almonds, fresh fruit, and beans are healthy sources of fiber.  Keep all follow-up visits as told by your doctor. This is important. Contact a doctor if:  You have sudden pain in the upper right side of your belly  (abdomen). Pain might spread to your right shoulder or your chest. This may be a sign of a gallbladder attack.  You feel sick to your stomach (are nauseous).  You throw up (vomit).  You have been diagnosed with gallstones that have no symptoms and you get: ? Belly pain. ? Discomfort, burning, or fullness in the upper part of your belly (indigestion). Get help right away if:  You have sudden pain in the upper right side of your belly, and it lasts for more than 2 hours.  You have belly pain that lasts for more than 5 hours.  You have a fever or chills.  You keep feeling sick to your stomach or you keep throwing up.  Your skin or the whites of your eyes turn yellow (jaundice).  You have dark-colored pee (urine).  You have light-colored poop (stool). Summary  Cholelithiasis is also called "gallstones."  The gallbladder is an organ that stores a liquid (bile) that helps you digest fat.  Silent gallstones are gallstones that do not cause symptoms.  A gallbladder attack may cause sudden pain in the upper right side of your belly. Pain might spread to your right shoulder or your chest. If this happens, contact your doctor.  If you have sudden pain in the upper right side of your belly that lasts for more than 2 hours, get help right away. This information is not intended to replace advice given to you by your health care provider. Make sure you discuss any questions you have with your health care provider. Document Released: 11/26/2007 Document Revised: 02/24/2016 Document Reviewed: 02/24/2016 Elsevier Interactive Patient Education  2017 [employer].

## 2018-03-05 MED ORDER — OMEPRAZOLE 40 MG PO CPDR: 40.0000 mg | DELAYED_RELEASE_CAPSULE | Freq: Every day | ORAL | 1 refills | Status: DC

## 2018-03-05 NOTE — Telephone Encounter (Signed)
Patient's prescription was sent to [facility]. Patient was notified.

## 2018-03-10 ENCOUNTER — Encounter: Payer: Self-pay | Admitting: Surgery

## 2018-03-22 ENCOUNTER — Other Ambulatory Visit: Payer: Self-pay | Admitting: Family Medicine

## 2018-03-22 MED ORDER — OXYCODONE HCL 15 MG PO TABS: 15.0000 mg | ORAL_TABLET | Freq: Four times a day (QID) | ORAL | 0 refills | Status: DC | PRN

## 2018-03-22 NOTE — Telephone Encounter (Signed)
Needs refill on oxycodone 15 mg. Sent to [facility]

## 2018-03-23 ENCOUNTER — Ambulatory Visit: Payer: Self-pay | Admitting: Surgery

## 2018-04-06 ENCOUNTER — Encounter: Payer: Medicare Other | Admitting: Obstetrics and Gynecology

## 2018-04-13 ENCOUNTER — Other Ambulatory Visit: Payer: Self-pay | Admitting: Surgery

## 2018-04-13 ENCOUNTER — Other Ambulatory Visit: Payer: Self-pay | Admitting: Family Medicine

## 2018-04-15 ENCOUNTER — Other Ambulatory Visit: Payer: Self-pay | Admitting: Family Medicine

## 2018-04-15 MED ORDER — OXYCODONE HCL 15 MG PO TABS: 15.0000 mg | ORAL_TABLET | Freq: Four times a day (QID) | ORAL | 0 refills | Status: DC | PRN

## 2018-04-15 NOTE — Telephone Encounter (Signed)
Pt needs refill on   Oxycodone 15mg   [facility]  CB#  [phone]  Thanks Con Memos

## 2018-04-19 DIAGNOSIS — H5213 Myopia, bilateral: Secondary | ICD-10-CM | POA: Diagnosis not present

## 2018-04-19 DIAGNOSIS — H04123 Dry eye syndrome of bilateral lacrimal glands: Secondary | ICD-10-CM | POA: Diagnosis not present

## 2018-04-22 ENCOUNTER — Ambulatory Visit: Payer: Medicare Other | Admitting: General Surgery

## 2018-04-27 ENCOUNTER — Ambulatory Visit (INDEPENDENT_AMBULATORY_CARE_PROVIDER_SITE_OTHER): Payer: Medicare Other | Admitting: General Surgery

## 2018-04-27 ENCOUNTER — Other Ambulatory Visit: Payer: Self-pay

## 2018-04-27 ENCOUNTER — Encounter: Payer: Self-pay | Admitting: General Surgery

## 2018-04-27 VITALS — BP 144/84 | HR 80 | Temp 98.3°F | Wt 166.0 lb

## 2018-04-27 DIAGNOSIS — K8020 Calculus of gallbladder without cholecystitis without obstruction: Secondary | ICD-10-CM | POA: Diagnosis not present

## 2018-04-27 DIAGNOSIS — R1011 Right upper quadrant pain: Secondary | ICD-10-CM | POA: Diagnosis not present

## 2018-04-27 NOTE — Progress Notes (Signed)
Patient ID: Courtney Ross, female   DOB: 14-Feb-1956, 62 y.o.   MRN: [id number]  Chief Complaint  Patient presents with  . Follow-up    Cholelithiasis    HPI Courtney Ross is a 62 y.o. female for an evaluation of abdominal pain on/off for a year. Patient reports she began to experience epigastric abdominal pain after eating over the past two months, but not specifically with fatty foods, and frequent belching over the same period of time with intermittent nausea with vomiting. Patient denies diarrhea due to IBS but does suffers from constipation. Patient was taking Linzess that worked for two years and then it stopped working. Patient was then given Amitiza since [place] was not approved by her insurance but it caused her to feel nauseated.   HPI  Past Medical History:  Diagnosis Date  . Anxiety   . Back pain   . Constipation   . Degenerative disc disease, lumbar   . Depression   . Family history of adverse reaction to anesthesia    daughter nausea  . Fatty liver   . GERD (gastroesophageal reflux disease)   . H/O spinal fusion   . Headache    migraines  . Headache, migraine 05/08/2015  . High cholesterol   . Hypertension   . Lower back pain   . Polycystic ovarian disease   . PONV (postoperative nausea and vomiting)   . Restless leg syndrome   . Spasm 01/29/2009    Past Surgical History:  Procedure Laterality Date  . [place]      [place]  . APPENDECTOMY  1988  . ARTHRODESIS METATARSALPHALANGEAL JOINT (MTPJ) Left 12/14/2014   Procedure: ARTHRODESIS METATARSALPHALANGEAL JOINT (MTPJ) 1ST;  Surgeon: Albertine Patricia, DPM;  Location: [place];  Service: Podiatry;  Laterality: Left;  LMA  . BACK SURGERY  [phone]   x3  . BLADDER SUSPENSION  2006  . BREAST BIOPSY Left 1990's  . BREAST SURGERY  2005   lumpectomy  . CARPAL TUNNEL RELEASE Bilateral 02/26/07,05/21/07  . COLPORRHAPHY    . DILATION AND CURETTAGE OF UTERUS    . [place]  . FOOT SURGERY    . HAMMER TOE SURGERY Left 03/22/2015   Procedure: HAMMER TOE CORRECTION L 2ND AND 3RD ;  Surgeon: Albertine Patricia, DPM;  Location: [place];  Service: Podiatry;  Laterality: Left;  WITH LOCAL  . HERNIA REPAIR  1988  . NECK SURGERY  2006   fusion  . RECTOCELE REPAIR N/A 08/25/2016   Procedure: POSTERIOR REPAIR (RECTOCELE);  Surgeon: Brayton Mars, MD;  Location: [facility];  Service: Gynecology;  Laterality: N/A;  . SPINAL CORD STIMULATOR IMPLANT    . TONSILLECTOMY  1985    Family History  Problem Relation Age of Onset  . Diabetes Father   . Hypertension Father   . CAD Father   . Diabetes Sister        non-Insulin dependent diabetes mellitus  . Heart disease Brother   . Cancer Daughter        cancer of the cervix at age 61-18  . Heart disease Brother   . Breast cancer Paternal Aunt   . Colon cancer Paternal Uncle   . Ovarian cancer Neg Hx     Social History Social History   Tobacco Use  . Smoking status: Never Smoker  . Smokeless tobacco: Never Used  Substance Use Topics  . Alcohol use: No    Alcohol/week: 0.0 standard drinks  Frequency: Never  . Drug use: No    Allergies  Allergen Reactions  . Hydrocodone Itching  . Lisinopril Cough  . Mirtazapine     Weight gain  . Other Nausea And Vomiting    general anesthesia      Review of Systems Review of Systems  Blood pressure (!) 144/84, pulse 80, temperature 98.3 F (36.8 C), temperature source Skin, weight 166 lb (75.3 kg).  Physical Exam Physical Exam  Constitutional: She is oriented to person, place, and time. She appears well-developed and well-nourished.  Cardiovascular: Normal rate, regular rhythm and normal heart sounds.  Pulmonary/Chest: Effort normal and breath sounds normal.      Abdominal: Soft. Bowel sounds are normal. There is no tenderness.    Neurological: She is alert and oriented to person, place, and time.  Skin: Skin is warm and dry.   Psychiatric: She has a normal mood and affect. Her behavior is normal. Judgment and thought content normal.    Data Reviewed Abdominal ultrasound of February 18, 2018 shows innumerable gallstones without gallbladder wall thickening.  Common bile duct 5 mm. Laboratory studies of February 18, 2018 showed elevation of the serum AST and ALT as well as alkaline phosphatase.  Normal total bilirubin at 1.0, mild elevation of the direct bilirubin is 0.3.  CBC of the same date showed mild microcytosis with a MCV of 76.8.  Hemoglobin 12.7.  White blood cell count 7000.  Platelet count 281,000.  Repeat liver function studies dated March 04, 2018 showed complete normalization of the liver function studies and a total bilirubin of 0.5.  Assessment    Right upper quadrant pain and belching, likely related to biliary colic.    Plan    The patient was made aware that while some of her symptoms may be related to her biliary tract, some may not.  No guarantee of complete relief of symptoms has been promised.  The potential for increased stool frequency with cholecystectomy was reviewed, and this may be helpful considering she experiences IBS with constipation.   Laparoscopic Cholecystectomy with Intraoperative Cholangiogram. The procedure, including it's potential risks and complications (including but not limited to infection, bleeding, injury to intra-abdominal organs or bile ducts, bile leak, poor cosmetic result, sepsis and death) were discussed with the patient in detail. Non-operative options, including their inherent risks (acute calculous cholecystitis with possible choledocholithiasis or gallstone pancreatitis, with the risk of ascending cholangitis, sepsis, and death) were discussed as well. The patient expressed and understanding of what we discussed and wishes to proceed with laparoscopic cholecystectomy. The patient further understands that if it is technically not possible, or it is unsafe to  proceed laparoscopically, that I will convert to an open cholecystectomy.    Patient will schedule for a laparoscopic cholecystectomy. HPI, Physical Exam, Assessment and Plan have been scribed under the direction and in the presence of Robert Bellow, MD. Wayna Chalet, CMA  The patient is scheduled for surgery at [facility] with Dr Bary Castilla on 05/03/18.  She will pre admit at the hospital. She is aware of date and instructions.   I have completed the exam and reviewed the above documentation for accuracy and completeness.  I agree with the above.  Haematologist has been used and any errors in dictation or transcription are unintentional.  Hervey Ard, M.D., F.A.C.S. will pre admit at the hospital. She is aware of date and instructions.   Forest Gleason  04/28/2018, 11:45 AM

## 2018-04-27 NOTE — Patient Instructions (Addendum)
The patient is scheduled for surgery at [facility] with Dr Bary Castilla on 05/03/18. She will pre admit at the hospital. She is aware of date and instructions.    Laparoscopic Cholecystectomy, Care After This sheet gives you information about how to care for yourself after your procedure. Your doctor may also give you more specific instructions. If you have problems or questions, contact your doctor. Follow these instructions at home: Care for cuts from surgery (incisions)   Follow instructions from your doctor about how to take care of your cuts from surgery. Make sure you: ? Wash your hands with soap and water before you change your bandage (dressing). If you cannot use soap and water, use hand sanitizer. ? Change your bandage as told by your doctor. ? Leave stitches (sutures), skin glue, or skin tape (adhesive) strips in place. They may need to stay in place for 2 weeks or longer. If tape strips get loose and curl up, you may trim the loose edges. Do not remove tape strips completely unless your doctor says it is okay.  Do not take baths, swim, or use a hot tub until your doctor says it is okay. Ask your doctor if you can take showers. You may only be allowed to take sponge baths for bathing.  Check your surgical cut area every day for signs of infection. Check for: ? More redness, swelling, or pain. ? More fluid or blood. ? Warmth. ? Pus or a bad smell. Activity  Do not drive or use heavy machinery while taking prescription pain medicine.  Do not lift anything that is heavier than 10 lb (4.5 kg) until your doctor says it is okay.  Do not play contact sports until your doctor says it is okay.  Do not drive for 24 hours if you were given a medicine to help you relax (sedative).  Rest as needed. Do not return to work or school until your doctor says it is okay. General instructions  Take over-the-counter and prescription medicines only as told by your doctor.  To prevent or treat  constipation while you are taking prescription pain medicine, your doctor may recommend that you: ? Drink enough fluid to keep your pee (urine) clear or pale yellow. ? Take over-the-counter or prescription medicines. ? Eat foods that are high in fiber, such as fresh fruits and vegetables, whole grains, and beans. ? Limit foods that are high in fat and processed sugars, such as fried and sweet foods. Contact a doctor if:  You develop a rash.  You have more redness, swelling, or pain around your surgical cuts.  You have more fluid or blood coming from your surgical cuts.  Your surgical cuts feel warm to the touch.  You have pus or a bad smell coming from your surgical cuts.  You have a fever.  One or more of your surgical cuts breaks open. Get help right away if:  You have trouble breathing.  You have chest pain.  You have pain that is getting worse in your shoulders.  You faint or feel dizzy when you stand.  You have very bad pain in your belly (abdomen).  You are sick to your stomach (nauseous) for more than one day.  You have throwing up (vomiting) that lasts for more than one day.  You have leg pain. This information is not intended to replace advice given to you by your health care provider. Make sure you discuss any questions you have with your health care provider. Document Released:  03/18/2008 Document Revised: 12/29/2015 Document Reviewed: 11/26/2015 Elsevier Interactive Patient Education  [employer].

## 2018-04-28 ENCOUNTER — Telehealth: Payer: Self-pay | Admitting: *Deleted

## 2018-04-28 DIAGNOSIS — R1011 Right upper quadrant pain: Secondary | ICD-10-CM | POA: Insufficient documentation

## 2018-04-28 NOTE — Telephone Encounter (Signed)
Patient contacted today and notified of pre-admission appointment date and time.   Surgery scheduled with Dr. Bary Castilla for 05-03-18 at [facility].   The patient is aware to call the office should she have further questions.

## 2018-04-29 ENCOUNTER — Encounter: Payer: Medicare Other | Admitting: Obstetrics and Gynecology

## 2018-04-29 ENCOUNTER — Ambulatory Visit: Payer: Medicare Other | Admitting: Obstetrics and Gynecology

## 2018-04-30 ENCOUNTER — Other Ambulatory Visit: Payer: Self-pay

## 2018-04-30 ENCOUNTER — Encounter
Admission: RE | Admit: 2018-04-30 | Discharge: 2018-04-30 | Disposition: A | Discharge status: Home / Self Care | Payer: Medicare Other | Source: Ambulatory Visit | Attending: General Surgery | Admitting: General Surgery

## 2018-04-30 NOTE — Patient Instructions (Signed)
Your procedure is scheduled on: Mon. 11/11 Report to Day Surgery. At 7:30.  Remember: Instructions that are not followed completely may result in serious medical risk,  up to and including death, or upon the discretion of your surgeon and anesthesiologist your  surgery may need to be rescheduled.     _X__ 1. Do not eat food after midnight the night before your procedure.                 No gum chewing or hard candies. You may drink clear liquids up to 2 hours                 before you are scheduled to arrive for your surgery- DO not drink clear                 liquids within 2 hours of the start of your surgery.                 Clear Liquids include:  water, apple juice without pulp, clear carbohydrate                 drink such as Clearfast of Gatorade, Black Coffee or Tea (Do not add                 anything to coffee or tea).  __X__2.  On the morning of surgery brush your teeth with toothpaste and water, you                may rinse your mouth with mouthwash if you wish.  Do not swallow any toothpaste of mouthwash.     _X__ 3.  No Alcohol for 24 hours before or after surgery.   _X__ 4.  Do Not Smoke or use e-cigarettes For 24 Hours Prior to Your Surgery.                 Do not use any chewable tobacco products for at least 6 hours prior to                 surgery.  ____  5.  Bring all medications with you on the day of surgery if instructed.   __x__  6.  Notify your doctor if there is any change in your medical condition      (cold, fever, infections).     Do not wear jewelry, make-up, hairpins, clips or nail polish. Do not wear lotions, powders, or perfumes. You may wear deodorant. Do not shave 48 hours prior to surgery. Men may shave face and neck. Do not bring valuables to the hospital.    [facility] is not responsible for any belongings or valuables.  Contacts, dentures or bridgework may not be worn into surgery. Leave your suitcase in the car.  After surgery it may be brought to your room. For patients admitted to the hospital, discharge time is determined by your treatment team.   Patients discharged the day of surgery will not be allowed to drive home.   Please read over the following fact sheets that you were given:    __x__ Take these medicines the morning of surgery with A SIP OF WATER:    1.buPROPion (WELLBUTRIN SR) 150 MG 12 hr tablet   2. cyclobenzaprine (FLEXERIL) 10 MG tablet if needed  3. diazepam (VALIUM) 10 MG tablet if needed  4.LYRICA 75 MG capsule  5.omeprazole (PRILOSEC) 40 MG capsule take extra dose the night before and the morning of surgery  6.oxyCODONE (  ROXICODONE) 15 MG immediate release tablet  ____ Fleet Enema (as directed)   ____ Use CHG Soap as directed  ____ Use inhalers on the day of surgery  ____ Stop metformin 2 days prior to surgery    ____ Take 1/2 of usual insulin dose the night before surgery. No insulin the morning          of surgery.   __x__ Stopped aspirin already  __x__ May continue Meloxicam per Dr. Bary Castilla   ____ Stop supplements until after surgery.    ____ Bring C-Pap to the hospital.

## 2018-04-30 NOTE — Pre-Procedure Instructions (Signed)
Was instructed to turn off her spinal cord stimulator before the procedure.

## 2018-05-03 ENCOUNTER — Ambulatory Visit: Payer: Medicare Other | Admitting: Certified Registered Nurse Anesthetist

## 2018-05-03 ENCOUNTER — Encounter
Admission: RE | Disposition: A | Discharge status: Home / Self Care | Payer: Self-pay | Source: Ambulatory Visit | Attending: General Surgery

## 2018-05-03 ENCOUNTER — Encounter: Payer: Self-pay | Admitting: *Deleted

## 2018-05-03 ENCOUNTER — Ambulatory Visit
Admission: RE | Admit: 2018-05-03 | Discharge: 2018-05-03 | Disposition: A | Discharge status: Home / Self Care | Payer: Medicare Other | Source: Ambulatory Visit | Attending: General Surgery | Admitting: General Surgery

## 2018-05-03 ENCOUNTER — Other Ambulatory Visit: Payer: Self-pay

## 2018-05-03 ENCOUNTER — Ambulatory Visit: Payer: Medicare Other

## 2018-05-03 DIAGNOSIS — Z8249 Family history of ischemic heart disease and other diseases of the circulatory system: Secondary | ICD-10-CM | POA: Insufficient documentation

## 2018-05-03 DIAGNOSIS — K5900 Constipation, unspecified: Secondary | ICD-10-CM | POA: Insufficient documentation

## 2018-05-03 DIAGNOSIS — K8020 Calculus of gallbladder without cholecystitis without obstruction: Secondary | ICD-10-CM | POA: Diagnosis not present

## 2018-05-03 DIAGNOSIS — E7800 Pure hypercholesterolemia, unspecified: Secondary | ICD-10-CM | POA: Insufficient documentation

## 2018-05-03 DIAGNOSIS — Z791 Long term (current) use of non-steroidal anti-inflammatories (NSAID): Secondary | ICD-10-CM | POA: Insufficient documentation

## 2018-05-03 DIAGNOSIS — M1990 Unspecified osteoarthritis, unspecified site: Secondary | ICD-10-CM | POA: Diagnosis not present

## 2018-05-03 DIAGNOSIS — Z7951 Long term (current) use of inhaled steroids: Secondary | ICD-10-CM | POA: Diagnosis not present

## 2018-05-03 DIAGNOSIS — K219 Gastro-esophageal reflux disease without esophagitis: Secondary | ICD-10-CM | POA: Diagnosis not present

## 2018-05-03 DIAGNOSIS — Z79899 Other long term (current) drug therapy: Secondary | ICD-10-CM | POA: Diagnosis not present

## 2018-05-03 DIAGNOSIS — G2581 Restless legs syndrome: Secondary | ICD-10-CM | POA: Diagnosis not present

## 2018-05-03 DIAGNOSIS — K8010 Calculus of gallbladder with chronic cholecystitis without obstruction: Secondary | ICD-10-CM | POA: Insufficient documentation

## 2018-05-03 DIAGNOSIS — Z419 Encounter for procedure for purposes other than remedying health state, unspecified: Secondary | ICD-10-CM

## 2018-05-03 DIAGNOSIS — K829 Disease of gallbladder, unspecified: Secondary | ICD-10-CM | POA: Diagnosis not present

## 2018-05-03 DIAGNOSIS — G43909 Migraine, unspecified, not intractable, without status migrainosus: Secondary | ICD-10-CM | POA: Diagnosis not present

## 2018-05-03 DIAGNOSIS — I10 Essential (primary) hypertension: Secondary | ICD-10-CM | POA: Insufficient documentation

## 2018-05-03 DIAGNOSIS — F419 Anxiety disorder, unspecified: Secondary | ICD-10-CM | POA: Insufficient documentation

## 2018-05-03 DIAGNOSIS — Z969 Presence of functional implant, unspecified: Secondary | ICD-10-CM | POA: Insufficient documentation

## 2018-05-03 DIAGNOSIS — F329 Major depressive disorder, single episode, unspecified: Secondary | ICD-10-CM | POA: Diagnosis not present

## 2018-05-03 DIAGNOSIS — M5136 Other intervertebral disc degeneration, lumbar region: Secondary | ICD-10-CM | POA: Diagnosis not present

## 2018-05-03 DIAGNOSIS — R1011 Right upper quadrant pain: Secondary | ICD-10-CM

## 2018-05-03 DIAGNOSIS — Z981 Arthrodesis status: Secondary | ICD-10-CM | POA: Diagnosis not present

## 2018-05-03 DIAGNOSIS — Z79890 Hormone replacement therapy (postmenopausal): Secondary | ICD-10-CM | POA: Diagnosis not present

## 2018-05-03 HISTORY — PX: CHOLECYSTECTOMY: SHX55

## 2018-05-03 SURGERY — LAPAROSCOPIC CHOLECYSTECTOMY WITH INTRAOPERATIVE CHOLANGIOGRAM
Anesthesia: General

## 2018-05-03 MED ORDER — LACTATED RINGERS IV SOLN
INTRAVENOUS | Status: DC
  Administered 2018-05-03 (×2): via INTRAVENOUS

## 2018-05-03 MED ORDER — DEXAMETHASONE SODIUM PHOSPHATE 10 MG/ML IJ SOLN
INTRAMUSCULAR | Status: DC | PRN
  Administered 2018-05-03: 5 mg via INTRAVENOUS

## 2018-05-03 MED ORDER — PROMETHAZINE HCL 25 MG/ML IJ SOLN: 6.2500 mg | INTRAMUSCULAR | Status: DC | PRN

## 2018-05-03 MED ORDER — LIDOCAINE HCL 4 % MT SOLN
OROMUCOSAL | Status: DC | PRN
  Administered 2018-05-03: 4 mL via TOPICAL

## 2018-05-03 MED ORDER — SODIUM CHLORIDE 0.9 % IV SOLN
INTRAVENOUS | Status: DC | PRN
  Administered 2018-05-03: 40 mL

## 2018-05-03 MED ORDER — PROPOFOL 10 MG/ML IV BOLUS
INTRAVENOUS | Status: DC | PRN
  Administered 2018-05-03: 150 mg via INTRAVENOUS

## 2018-05-03 MED ORDER — PHENYLEPHRINE HCL 10 MG/ML IJ SOLN
INTRAMUSCULAR | Status: DC | PRN
  Administered 2018-05-03 (×2): 100 ug via INTRAVENOUS
  Administered 2018-05-03: 200 ug via INTRAVENOUS

## 2018-05-03 MED ORDER — KETOROLAC TROMETHAMINE 30 MG/ML IJ SOLN
INTRAMUSCULAR | Status: DC | PRN
  Administered 2018-05-03: 30 mg via INTRAVENOUS

## 2018-05-03 MED ORDER — FENTANYL CITRATE (PF) 100 MCG/2ML IJ SOLN
INTRAMUSCULAR | Status: DC | PRN
  Administered 2018-05-03: 100 ug via INTRAVENOUS
  Administered 2018-05-03: 50 ug via INTRAVENOUS

## 2018-05-03 MED ORDER — FENTANYL CITRATE (PF) 100 MCG/2ML IJ SOLN
INTRAMUSCULAR | Status: AC
  Filled 2018-05-03: qty 2

## 2018-05-03 MED ORDER — ONDANSETRON HCL 4 MG/2ML IJ SOLN
INTRAMUSCULAR | Status: AC
  Filled 2018-05-03: qty 2

## 2018-05-03 MED ORDER — PROPOFOL 10 MG/ML IV BOLUS
INTRAVENOUS | Status: AC
  Filled 2018-05-03: qty 20

## 2018-05-03 MED ORDER — LIDOCAINE HCL (PF) 2 % IJ SOLN
INTRAMUSCULAR | Status: AC
  Filled 2018-05-03: qty 10

## 2018-05-03 MED ORDER — KETOROLAC TROMETHAMINE 30 MG/ML IJ SOLN
INTRAMUSCULAR | Status: AC
  Filled 2018-05-03: qty 1

## 2018-05-03 MED ORDER — LIDOCAINE HCL (CARDIAC) PF 100 MG/5ML IV SOSY
PREFILLED_SYRINGE | INTRAVENOUS | Status: DC | PRN
  Administered 2018-05-03: 60 mg via INTRAVENOUS

## 2018-05-03 MED ORDER — GABAPENTIN 300 MG PO CAPS
300.0000 mg | ORAL_CAPSULE | ORAL | Status: AC
  Administered 2018-05-03: 300 mg via ORAL

## 2018-05-03 MED ORDER — ONDANSETRON HCL 4 MG/2ML IJ SOLN
INTRAMUSCULAR | Status: DC | PRN
  Administered 2018-05-03: 4 mg via INTRAVENOUS

## 2018-05-03 MED ORDER — SUGAMMADEX SODIUM 200 MG/2ML IV SOLN
INTRAVENOUS | Status: DC | PRN
  Administered 2018-05-03: 150 mg via INTRAVENOUS

## 2018-05-03 MED ORDER — FENTANYL CITRATE (PF) 100 MCG/2ML IJ SOLN
25.0000 ug | INTRAMUSCULAR | Status: DC | PRN
  Administered 2018-05-03 (×3): 50 ug via INTRAVENOUS

## 2018-05-03 MED ORDER — DEXAMETHASONE SODIUM PHOSPHATE 10 MG/ML IJ SOLN
INTRAMUSCULAR | Status: AC
  Filled 2018-05-03: qty 1

## 2018-05-03 MED ORDER — ACETAMINOPHEN 10 MG/ML IV SOLN
INTRAVENOUS | Status: DC | PRN
  Administered 2018-05-03: 1000 mg via INTRAVENOUS

## 2018-05-03 MED ORDER — SUGAMMADEX SODIUM 200 MG/2ML IV SOLN
INTRAVENOUS | Status: AC
  Filled 2018-05-03: qty 2

## 2018-05-03 MED ORDER — FENTANYL CITRATE (PF) 100 MCG/2ML IJ SOLN
INTRAMUSCULAR | Status: AC
  Administered 2018-05-03: 50 ug via INTRAVENOUS
  Filled 2018-05-03: qty 2

## 2018-05-03 MED ORDER — ROCURONIUM BROMIDE 100 MG/10ML IV SOLN
INTRAVENOUS | Status: DC | PRN
  Administered 2018-05-03: 50 mg via INTRAVENOUS
  Administered 2018-05-03: 20 mg via INTRAVENOUS

## 2018-05-03 MED ORDER — ROCURONIUM BROMIDE 50 MG/5ML IV SOLN
INTRAVENOUS | Status: AC
  Filled 2018-05-03: qty 1

## 2018-05-03 MED ORDER — FENTANYL CITRATE (PF) 250 MCG/5ML IJ SOLN
INTRAMUSCULAR | Status: AC
  Filled 2018-05-03: qty 5

## 2018-05-03 MED ORDER — MIDAZOLAM HCL 2 MG/2ML IJ SOLN
INTRAMUSCULAR | Status: AC
  Filled 2018-05-03: qty 2

## 2018-05-03 MED ORDER — BUPIVACAINE-EPINEPHRINE 0.5% -1:200000 IJ SOLN
INTRAMUSCULAR | Status: DC | PRN
  Administered 2018-05-03: 30 mL

## 2018-05-03 MED ORDER — MIDAZOLAM HCL 2 MG/2ML IJ SOLN
INTRAMUSCULAR | Status: DC | PRN
  Administered 2018-05-03: 2 mg via INTRAVENOUS

## 2018-05-03 MED ORDER — GABAPENTIN 300 MG PO CAPS
ORAL_CAPSULE | ORAL | Status: AC
  Administered 2018-05-03: 300 mg via ORAL
  Filled 2018-05-03: qty 1

## 2018-05-03 MED ORDER — SODIUM CHLORIDE (PF) 0.9 % IJ SOLN
INTRAMUSCULAR | Status: AC
  Filled 2018-05-03: qty 50

## 2018-05-03 SURGICAL SUPPLY — 51 items
APPLIER CLIP ROT 10 11.4 M/L (STAPLE) ×2
APR CLP MED LRG 11.4X10 (STAPLE) ×1
BAG SPEC RTRVL LRG 6X4 10 (ENDOMECHANICALS)
BLADE SURG 11 STRL SS SAFETY (MISCELLANEOUS) ×2 IMPLANT
CANISTER SUCT 1200ML W/VALVE (MISCELLANEOUS) ×2 IMPLANT
CANNULA DILATOR 10 W/SLV (CANNULA) ×2 IMPLANT
CANNULA DILATOR 5 W/SLV (CANNULA) ×4 IMPLANT
CATH CHOLANG 76X19 KUMAR (CATHETERS) ×2 IMPLANT
CHLORAPREP W/TINT 26ML (MISCELLANEOUS) ×2 IMPLANT
CLEANER CAUTERY TIP 5X5 PAD (MISCELLANEOUS) IMPLANT
CLIP APPLIE ROT 10 11.4 M/L (STAPLE) ×1 IMPLANT
CONRAY 60ML FOR OR (MISCELLANEOUS) ×2 IMPLANT
COVER WAND RF STERILE (DRAPES) ×1 IMPLANT
DISSECTOR KITTNER STICK (MISCELLANEOUS) ×1 IMPLANT
DISSECTORS/KITTNER STICK (MISCELLANEOUS)
DRAPE SHEET LG 3/4 BI-LAMINATE (DRAPES) ×3 IMPLANT
DRSG TEGADERM 2-3/8X2-3/4 SM (GAUZE/BANDAGES/DRESSINGS) ×8 IMPLANT
DRSG TELFA 4X3 1S NADH ST (GAUZE/BANDAGES/DRESSINGS) ×2 IMPLANT
ELECT REM PT RETURN 9FT ADLT (ELECTROSURGICAL) ×2
ELECTRODE REM PT RTRN 9FT ADLT (ELECTROSURGICAL) ×1 IMPLANT
GLOVE BIO SURGEON STRL SZ7.5 (GLOVE) ×3 IMPLANT
GLOVE INDICATOR 8.0 STRL GRN (GLOVE) ×3 IMPLANT
GOWN STRL REUS W/ TWL LRG LVL3 (GOWN DISPOSABLE) ×3 IMPLANT
GOWN STRL REUS W/TWL LRG LVL3 (GOWN DISPOSABLE) ×4
IRRIGATION STRYKERFLOW (MISCELLANEOUS) ×1 IMPLANT
IRRIGATOR STRYKERFLOW (MISCELLANEOUS) ×2
IV LACTATED RINGERS 1000ML (IV SOLUTION) ×2 IMPLANT
KIT TURNOVER KIT A (KITS) ×2 IMPLANT
LABEL OR SOLS (LABEL) ×2 IMPLANT
NDL INSUFF ACCESS 14 VERSASTEP (NEEDLE) ×2 IMPLANT
NEEDLE HYPO 22GX1.5 SAFETY (NEEDLE) ×1 IMPLANT
NS IRRIG 500ML POUR BTL (IV SOLUTION) ×2 IMPLANT
PACK LAP CHOLECYSTECTOMY (MISCELLANEOUS) ×2 IMPLANT
PAD CLEANER CAUTERY TIP 5X5 (MISCELLANEOUS) ×1
PENCIL ELECTRO HAND CTR (MISCELLANEOUS) ×1 IMPLANT
POUCH SPECIMEN RETRIEVAL 10MM (ENDOMECHANICALS) IMPLANT
SCISSORS METZENBAUM CVD 33 (INSTRUMENTS) ×2 IMPLANT
STRIP CLOSURE SKIN 1/2X4 (GAUZE/BANDAGES/DRESSINGS) ×2 IMPLANT
SUT PROLENE 2 0 SH DA (SUTURE) ×1 IMPLANT
SUT VIC AB 0 CT2 27 (SUTURE) ×1 IMPLANT
SUT VIC AB 2-0 SH 27 (SUTURE) ×2
SUT VIC AB 2-0 SH 27XBRD (SUTURE) IMPLANT
SUT VIC AB 4-0 FS2 27 (SUTURE) ×2 IMPLANT
SUT VIC AB 4-0 SH 27 (SUTURE)
SUT VIC AB 4-0 SH 27XANBCTRL (SUTURE) IMPLANT
SWABSTK COMLB BENZOIN TINCTURE (MISCELLANEOUS) ×2 IMPLANT
SYR 10ML LL (SYRINGE) ×1 IMPLANT
TROCAR XCEL BLUNT TIP 100MML (ENDOMECHANICALS) ×1 IMPLANT
TROCAR XCEL NON-BLD 11X100MML (ENDOMECHANICALS) ×2 IMPLANT
TUBING INSUFFLATION (TUBING) ×2 IMPLANT
WATER STERILE IRR 1000ML POUR (IV SOLUTION) ×2 IMPLANT

## 2018-05-03 NOTE — Op Note (Signed)
Preoperative diagnosis: Cholelithiasis, chronic right upper quadrant pain.  Postoperative diagnosis: Same.  Operative procedure: Laparoscopic cholecystectomy with intraoperative cholangiograms.  Operating Surgeon: Hervey Ard, MD.  Anesthesia: General endotracheal, Marcaine 0.5% with 1 to 200,000 units of epinephrine, 30 cc.  Estimated blood loss: 2 cc.  Clinical note: This 62 year old woman has had upper abdominal pain with radiation to the right superior posterior shoulder.  There is some dietary component.  Ultrasound showed multiple stones.  No gallbladder wall thickening.  Given options for management she desired to proceed to cholecystectomy.  Operative note: The patient underwent general endotracheal anesthesia without difficulty.  The abdomen was cleansed with ChloraPrep and draped.  Attempts to place a varies needle through a trans-umbilical incision were unsuccessful with poor insufflation flows.  It was elected to convert to [facility] technique.  Multiple thick folds of umbilical skin were divided superiorly to allow exposure of the midline fascia.  This was opened and the peritoneum grasped.  The undersurface was clear.  A 2-0 Prolene baseball suture was placed in the fascial opening.  The Hassan cannula was placed in the abdomen insufflated at 10 mmHg pressure.  Inspection showed no evidence of injury from prior varies needle passages.  The patient was placed in reverse Trendelenburg position and rolled to the left.  An 11 mm XL port was placed in the epigastrium.  Inspection from the site showed no evidence of injury from initial port placement.  The fluoroscopy unit was used to identify the location of the patient's nerve stimulator in the right lower quadrant to be sure leads were not violated by the lateral ports.  The lateral ports were then placed and under direct vision easily 3 fingerbreadths away from the generator and well away from the leads.  The gallbladder showed minimal  changes of chronic cholecystitis, some prominent adipose tissue and thickening near the neck of the gallbladder.  The gallbladder was placed on cephalad traction.  The neck of the gallbladder was cleared.  A Kumar clamp was placed.  Fluoroscopic cholangiograms were completed using 40 cc of one half strength Conray 60.  This showed prompt filling of the cystic duct and common bile duct with delayed flow through the ampulla into the duodenum.  In spite of rotating the patient several times it was not possible to fill the hepatic duct.  The critical view of safety was obtained showing only 2 structures entering the gallbladder at the neck.  These were both doubly clipped and divided.  The gallbladder was removed from the liver bed making use of a cautery dissection.  He was delivered through the umbilical port site.  The abdomen was reinsufflated and good hemostasis was noted.  The right upper quadrant was irrigated with lactated Ringer solution.  The lateral ports were removed under direct vision as was the epigastric port.  The fascia at the umbilical area was closed with the previously placed baseball suture.  The adipose layer was approximated with a 2-0 Vicryl suture.  Skin incisions were closed with 4-0 Vicryl subcuticular sutures.  Benzoin and Steri-Strips followed by Telfa and Tegaderm dressings were applied.  The patient tolerated the procedure well and was taken to recovery room in stable condition.

## 2018-05-03 NOTE — Discharge Instructions (Addendum)
Laparoscopic Cholecystectomy, Care After This sheet gives you information about how to care for yourself after your procedure. Your doctor may also give you more specific instructions. If you have problems or questions, contact your doctor. Follow these instructions at home: Care for cuts from surgery (incisions)   Follow instructions from your doctor about how to take care of your cuts from surgery. Make sure you: ? Wash your hands with soap and water before you change your bandage (dressing). If you cannot use soap and water, use hand sanitizer. ? Change your bandage as told by your doctor. ? Leave stitches (sutures), skin glue, or skin tape (adhesive) strips in place. They may need to stay in place for 2 weeks or longer. If tape strips get loose and curl up, you may trim the loose edges. Do not remove tape strips completely unless your doctor says it is okay.  Do not take baths, swim, or use a hot tub until your doctor says it is okay. Ask your doctor if you can take showers. You may only be allowed to take sponge baths for bathing.  Check your surgical cut area every day for signs of infection. Check for: ? More redness, swelling, or pain. ? More fluid or blood. ? Warmth. ? Pus or a bad smell. Activity  Do not drive or use heavy machinery while taking prescription pain medicine.  Do not lift anything that is heavier than 10 lb (4.5 kg) until your doctor says it is okay.  Do not play contact sports until your doctor says it is okay.  Do not drive for 24 hours if you were given a medicine to help you relax (sedative).  Rest as needed. Do not return to work or school until your doctor says it is okay. General instructions  Take over-the-counter and prescription medicines only as told by your doctor.  To prevent or treat constipation while you are taking prescription pain medicine, your doctor may recommend that you: ? Drink enough fluid to keep your pee (urine) clear or pale  yellow. ? Take over-the-counter or prescription medicines. ? Eat foods that are high in fiber, such as fresh fruits and vegetables, whole grains, and beans. ? Limit foods that are high in fat and processed sugars, such as fried and sweet foods. Contact a doctor if:  You develop a rash.  You have more redness, swelling, or pain around your surgical cuts.  You have more fluid or blood coming from your surgical cuts.  Your surgical cuts feel warm to the touch.  You have pus or a bad smell coming from your surgical cuts.  You have a fever.  One or more of your surgical cuts breaks open. Get help right away if:  You have trouble breathing.  You have chest pain.  You have pain that is getting worse in your shoulders.  You faint or feel dizzy when you stand.  You have very bad pain in your belly (abdomen).  You are sick to your stomach (nauseous) for more than one day.  You have throwing up (vomiting) that lasts for more than one day.  You have leg pain. This information is not intended to replace advice given to you by your health care provider. Make sure you discuss any questions you have with your health care provider. Document Released: 03/18/2008 Document Revised: 12/29/2015 Document Reviewed: 11/26/2015 Elsevier Interactive Patient Education  2018 [place]   1) The drugs that you were  given will stay in your system until tomorrow so for the next 24 hours you should not:  A) Drive an automobile B) Make any legal decisions C) Drink any alcoholic beverage   2) You may resume regular meals tomorrow.  Today it is better to start with liquids and gradually work up to solid foods.  You may eat anything you prefer, but it is better to start with liquids, then soup and crackers, and gradually work up to solid foods.   3) Please notify your doctor immediately if you have any unusual bleeding, trouble breathing, redness  and pain at the surgery site, drainage, fever, or pain not relieved by medication.    4) Additional Instructions:        Please contact your physician with any problems or Same Day Surgery at [phone], Monday through Friday 6 am to 4 pm, or [place] at [facility] number at [phone].

## 2018-05-03 NOTE — Transfer of Care (Signed)
Immediate Anesthesia Transfer of Care Note  Patient: Courtney Ross  Procedure(s) Performed: LAPAROSCOPIC CHOLECYSTECTOMY WITH INTRAOPERATIVE CHOLANGIOGRAM (N/A )  Patient Location: PACU  Anesthesia Type:General  Level of Consciousness: sedated  Airway & Oxygen Therapy: Patient Spontanous Breathing and Patient connected to face mask oxygen  Post-op Assessment: Report given to RN and Post -op Vital signs reviewed and stable  Post vital signs: Reviewed and stable  Last Vitals:  Vitals Value Taken Time  BP 114/65 05/03/2018 10:28 AM  Temp 36.8 C 05/03/2018 10:28 AM  Pulse 84 05/03/2018 10:30 AM  Resp 18 05/03/2018 10:30 AM  SpO2 100 % 05/03/2018 10:30 AM  Vitals shown include unvalidated device data.  Last Pain:  Vitals:   05/03/18 0805  TempSrc: Tympanic  PainSc: 0-No pain         Complications: No apparent anesthesia complications

## 2018-05-03 NOTE — Anesthesia Post-op Follow-up Note (Signed)
Anesthesia QCDR form completed.        

## 2018-05-03 NOTE — Anesthesia Procedure Notes (Signed)
Procedure Name: Intubation Date/Time: 05/03/2018 9:01 AM Performed by: Eben Burow, CRNA Pre-anesthesia Checklist: Patient identified, Emergency Drugs available, Suction available, Patient being monitored and Timeout performed Patient Re-evaluated:Patient Re-evaluated prior to induction Oxygen Delivery Method: Circle system utilized Preoxygenation: Pre-oxygenation with 100% oxygen Induction Type: IV induction Ventilation: Mask ventilation without difficulty Laryngoscope Size: Miller and 2 Grade View: Grade I Tube type: Oral Tube size: 7.0 mm Number of attempts: 1 Airway Equipment and Method: Stylet and LTA kit utilized Placement Confirmation: ETT inserted through vocal cords under direct vision,  positive ETCO2 and breath sounds checked- equal and bilateral Secured at: 20 cm Tube secured with: Tape Dental Injury: Teeth and Oropharynx as per pre-operative assessment

## 2018-05-03 NOTE — Anesthesia Preprocedure Evaluation (Signed)
Anesthesia Evaluation  Patient identified by MRN, date of birth, ID band Patient awake    Reviewed: Allergy & Precautions, H&P , Patient's Chart, lab work & pertinent test results  History of Anesthesia Complications (+) PONV, Family history of anesthesia reaction and history of anesthetic complications  Airway Mallampati: II  TM Distance: >3 FB Neck ROM: full    Dental  (+) Edentulous Upper, Missing, Partial Lower, Dental Advidsory Given, Upper Dentures   Pulmonary neg pulmonary ROS, former smoker,           Cardiovascular Exercise Tolerance: Good hypertension, Pt. on medications (-) angina(-) CAD, (-) Past MI, (-) Cardiac Stents and (-) CABG Normal cardiovascular exam(-) dysrhythmias (-) Valvular Problems/Murmurs     Neuro/Psych  Headaches, neg Seizures PSYCHIATRIC DISORDERS Anxiety Depression    GI/Hepatic negative GI ROS, Neg liver ROS, GERD  Medicated and Controlled,  Endo/Other  neg diabetesHypothyroidism   Renal/GU   Female GU complaint     Musculoskeletal  (+) Arthritis , Osteoarthritis,    Abdominal   Peds negative pediatric ROS (+)  Hematology negative hematology ROS (+)   Anesthesia Other Findings Past Medical History: No date: Anxiety No date: Back pain No date: Constipation No date: Degenerative disc disease, lumbar No date: Depression No date: Family history of adverse reaction to anesthes*     Comment: daughter nausea No date: Fatty liver No date: GERD (gastroesophageal reflux disease) No date: Headache     Comment: migraines 05/08/2015: Headache, migraine No date: High cholesterol No date: Hypertension No date: Lower back pain No date: Polycystic ovarian disease No date: PONV (postoperative nausea and vomiting) No date: Restless leg syndrome 01/29/2009: Spasm  Reproductive/Obstetrics negative OB ROS                             Anesthesia Physical  Anesthesia  Plan  ASA: II  Anesthesia Plan: General   Post-op Pain Management:    Induction: Intravenous  PONV Risk Score and Plan: 4 or greater and Ondansetron, Dexamethasone, Midazolam, Promethazine and Treatment may vary due to age or medical condition  Airway Management Planned: Oral ETT  Additional Equipment:   Intra-op Plan:   Post-operative Plan: Extubation in OR  Informed Consent: I have reviewed the patients History and Physical, chart, labs and discussed the procedure including the risks, benefits and alternatives for the proposed anesthesia with the patient or authorized representative who has indicated his/her understanding and acceptance.     Plan Discussed with: CRNA  Anesthesia Plan Comments:         Anesthesia Quick Evaluation

## 2018-05-03 NOTE — H&P (Signed)
No change in clinical condition or exam. For cholecystectomy.

## 2018-05-04 DIAGNOSIS — M4125 Other idiopathic scoliosis, thoracolumbar region: Secondary | ICD-10-CM | POA: Diagnosis not present

## 2018-05-04 DIAGNOSIS — M47816 Spondylosis without myelopathy or radiculopathy, lumbar region: Secondary | ICD-10-CM | POA: Diagnosis not present

## 2018-05-04 DIAGNOSIS — M41126 Adolescent idiopathic scoliosis, lumbar region: Secondary | ICD-10-CM | POA: Diagnosis not present

## 2018-05-04 LAB — SURGICAL PATHOLOGY

## 2018-05-04 NOTE — Anesthesia Postprocedure Evaluation (Signed)
Anesthesia Post Note  Patient: Courtney Ross  Procedure(s) Performed: LAPAROSCOPIC CHOLECYSTECTOMY WITH INTRAOPERATIVE CHOLANGIOGRAM (N/A )  Patient location during evaluation: PACU Anesthesia Type: General Level of consciousness: awake and alert Pain management: pain level controlled Vital Signs Assessment: post-procedure vital signs reviewed and stable Respiratory status: spontaneous breathing, nonlabored ventilation, respiratory function stable and patient connected to nasal cannula oxygen Cardiovascular status: blood pressure returned to baseline and stable Postop Assessment: no apparent nausea or vomiting Anesthetic complications: no     Last Vitals:  Vitals:   05/03/18 1146 05/03/18 1310  BP: 110/74 108/62  Pulse: 77 73  Resp: 18 17  Temp: 36.8 C   SpO2: 100% 95%    Last Pain:  Vitals:   05/03/18 1310  TempSrc:   PainSc: 2                  Martha Clan

## 2018-05-06 ENCOUNTER — Telehealth: Payer: Self-pay | Admitting: *Deleted

## 2018-05-06 MED ORDER — ONDANSETRON 4 MG PO TBDP: 4.0000 mg | ORAL_TABLET | Freq: Three times a day (TID) | ORAL | 0 refills | Status: DC | PRN

## 2018-05-06 NOTE — Telephone Encounter (Signed)
Patient called and stated that she is nauseous from having surgery on 05/03/18 and wanted to know if she can get some Zofran called in to help with nauseous.

## 2018-05-06 NOTE — Telephone Encounter (Signed)
She states she has a lot of gas and feels nauseated, no vomiting. Courtney Ross are moving normal now after a dose of laxatives. Appetite seems to be "ok". Pain is doing well, not using much pain medication.

## 2018-05-06 NOTE — Telephone Encounter (Signed)
Zofran ODT #[id number] sent to pharmacy per Dr Bary Castilla, pt aware.

## 2018-05-11 DIAGNOSIS — M41125 Adolescent idiopathic scoliosis, thoracolumbar region: Secondary | ICD-10-CM | POA: Diagnosis not present

## 2018-05-11 DIAGNOSIS — M47816 Spondylosis without myelopathy or radiculopathy, lumbar region: Secondary | ICD-10-CM | POA: Diagnosis not present

## 2018-05-13 ENCOUNTER — Ambulatory Visit (INDEPENDENT_AMBULATORY_CARE_PROVIDER_SITE_OTHER): Payer: Medicare Other | Admitting: General Surgery

## 2018-05-13 ENCOUNTER — Other Ambulatory Visit: Payer: Self-pay | Admitting: Family Medicine

## 2018-05-13 ENCOUNTER — Other Ambulatory Visit: Payer: Self-pay

## 2018-05-13 ENCOUNTER — Encounter: Payer: Self-pay | Admitting: General Surgery

## 2018-05-13 VITALS — BP 134/82 | HR 89 | Temp 97.5°F | Resp 12 | Ht 68.0 in | Wt 166.0 lb

## 2018-05-13 DIAGNOSIS — K8020 Calculus of gallbladder without cholecystitis without obstruction: Secondary | ICD-10-CM

## 2018-05-13 DIAGNOSIS — F324 Major depressive disorder, single episode, in partial remission: Secondary | ICD-10-CM

## 2018-05-13 NOTE — Progress Notes (Signed)
Patient ID: EDDIS Ross, female   DOB: [birth year], 62 y.o.   MRN: [id number]  Chief Complaint  Patient presents with  . Routine Post Op    laparoscopic cholecystectomy    HPI Courtney Ross is a 62 y.o. female.  Here for postoperative visit, laparoscopic cholecystectomy on 05-03-18. She states she is doing well and bowels moving regular with the use of miralax and stool softeners.  The patient reports it may be time for a follow-up colonoscopy.  She is unclear as to when her last study was completed at [facility].   HPI  Past Medical History:  Diagnosis Date  . Anxiety   . Back pain   . Colon polyp   . Constipation   . Degenerative disc disease, lumbar   . Depression   . Family history of adverse reaction to anesthesia    daughter nausea  . Fatty liver   . GERD (gastroesophageal reflux disease)   . H/O spinal fusion   . Headache    migraines  . Headache, migraine 05/08/2015  . High cholesterol   . Hypertension   . Lower back pain   . Polycystic ovarian disease   . PONV (postoperative nausea and vomiting)   . Restless leg syndrome   . Spasm 01/29/2009    Past Surgical History:  Procedure Laterality Date  . [place]  . APPENDECTOMY  1988  . ARTHRODESIS METATARSALPHALANGEAL JOINT (MTPJ) Left 12/14/2014   Procedure: ARTHRODESIS METATARSALPHALANGEAL JOINT (MTPJ) 1ST;  Surgeon: Albertine Patricia, DPM;  Location: [place];  Service: Podiatry;  Laterality: Left;  LMA  . BACK SURGERY  [phone]   x3  . BLADDER SUSPENSION  2006  . BREAST BIOPSY Left 1990's  . BREAST SURGERY  2005   lumpectomy  . CARPAL TUNNEL RELEASE Bilateral 02/26/07,05/21/07  . CHOLECYSTECTOMY N/A 05/03/2018   Procedure: LAPAROSCOPIC CHOLECYSTECTOMY WITH INTRAOPERATIVE CHOLANGIOGRAM;  Surgeon: Robert Bellow, MD;  Location: [facility];  Service: General;  Laterality: N/A;  . COLONOSCOPY    . COLPORRHAPHY    . DILATION AND CURETTAGE OF UTERUS    .  [place]  . FOOT SURGERY    . HAMMER TOE SURGERY Left 03/22/2015   Procedure: HAMMER TOE CORRECTION L 2ND AND 3RD ;  Surgeon: Albertine Patricia, DPM;  Location: [place];  Service: Podiatry;  Laterality: Left;  WITH LOCAL  . HERNIA REPAIR Right 1988   inguinal  . NECK SURGERY  2006   fusion  . RECTOCELE REPAIR N/A 08/25/2016   Procedure: POSTERIOR REPAIR (RECTOCELE);  Surgeon: Brayton Mars, MD;  Location: [facility];  Service: Gynecology;  Laterality: N/A;  . SPINAL CORD STIMULATOR IMPLANT    . TONSILLECTOMY  1985    Family History  Problem Relation Age of Onset  . Diabetes Father   . Hypertension Father   . CAD Father   . Diabetes Sister        non-Insulin dependent diabetes mellitus  . Heart disease Brother   . Cancer Daughter        cancer of the cervix at age 30-18  . Heart disease Brother   . Breast cancer Paternal Aunt   . Colon cancer Paternal Uncle   . Ovarian cancer Neg Hx     Social History Social History   Tobacco Use  . Smoking status: Never Smoker  . Smokeless tobacco: Never Used  Substance Use Topics  . Alcohol use: No  Alcohol/week: 0.0 standard drinks    Frequency: Never  . Drug use: No    Allergies  Allergen Reactions  . Hydrocodone Itching  . Lisinopril Cough  . Mirtazapine     Weight gain  . Other Nausea And Vomiting    general anesthesia    Current Outpatient Medications  Medication Sig Dispense Refill  . AMITIZA 24 MCG capsule TAKE 1 CAPSULE BY MOUTH TWICE DAILY WITHA MEAL 60 capsule 4  . aspirin EC 81 MG tablet Take 81 mg by mouth at bedtime.    Marland Kitchen buPROPion (WELLBUTRIN SR) 150 MG 12 hr tablet Take 1 tablet (150 mg total) by mouth 2 (two) times daily. 60 tablet 11  . Calcium Carbonate (CALCI-CHEW PO) Take 2 tablets by mouth daily.    . Cholecalciferol (VITAMIN D3) 5000 UNITS CAPS Take 5,000 Units by mouth daily.     . Cyanocobalamin (B-12 COMPLIANCE INJECTION IJ) Inject 1 Dose as directed every 30  (thirty) days.    . cyclobenzaprine (FLEXERIL) 10 MG tablet Take 10 mg by mouth 3 (three) times daily as needed for muscle spasms.    . diazepam (VALIUM) 10 MG tablet Take 1 tablet (10 mg total) by mouth every 6 (six) hours as needed for anxiety. 120 tablet 4  . docusate sodium (COLACE) 100 MG capsule Take 100 mg by mouth daily.    Marland Kitchen estradiol (ESTRACE) 1 MG tablet TAKE ONE TABLET BY MOUTH EVERY DAY 90 tablet 1  . fluticasone (FLONASE) 50 MCG/ACT nasal spray Place 1 spray into both nostrils daily as needed for allergies or rhinitis.    . hydroquinone 4 % cream Apply 1 application topically daily as needed (dark spots).   1  . meloxicam (MOBIC) 15 MG tablet Take 15 mg by mouth at bedtime.     . Multiple Vitamin (MULTIVITAMIN WITH MINERALS) TABS tablet Take 1 tablet by mouth daily.    . mupirocin cream (BACTROBAN) 2 % Apply 1 application topically 2 (two) times daily. (Patient taking differently: Apply 1 application topically 2 (two) times daily as needed (sores in nose). ) 15 g 1  . omeprazole (PRILOSEC) 40 MG capsule Take 1 capsule (40 mg total) by mouth daily. 30 capsule 1  . ondansetron (ZOFRAN-ODT) 4 MG disintegrating tablet Take 4 mg by mouth every 8 (eight) hours as needed for nausea or vomiting.    . ondansetron (ZOFRAN-ODT) 4 MG disintegrating tablet Take 1 tablet (4 mg total) by mouth every 8 (eight) hours as needed for nausea or vomiting. 12 tablet 0  . oxyCODONE (ROXICODONE) 15 MG immediate release tablet Take 1-2 tablets (15-30 mg total) by mouth 4 (four) times daily as needed. (Patient taking differently: Take 15-30 mg by mouth 4 (four) times daily as needed for pain. ) 180 tablet 0  . phentermine 15 MG capsule Take 1 capsule (15 mg total) by mouth every morning. 30 capsule 2  . polyethylene glycol (MIRALAX / GLYCOLAX) packet Take 17 g by mouth daily as needed for mild constipation.     . pregabalin (LYRICA) 75 MG capsule TAKE 1 CAPSULE BY MOUTH 3 TIMES DAILY 90 capsule 4  . RESTASIS  0.05 % ophthalmic emulsion Apply 1 drop to eye 2 (two) times daily.    Marland Kitchen rOPINIRole (REQUIP) 1 MG tablet Take 1 tablet (1 mg total) by mouth at bedtime. 30 tablet 11   No current facility-administered medications for this visit.     Review of Systems Review of Systems  Constitutional: Negative.   Respiratory:  Negative.   Cardiovascular: Negative.     Blood pressure 134/82, pulse 89, temperature (!) 97.5 F (36.4 C), temperature source Skin, resp. rate 12, height 5\' 8"  (1.727 m), weight 166 lb (75.3 kg), SpO2 98 %.  Physical Exam Physical Exam  Constitutional: She is oriented to person, place, and time. She appears well-developed and well-nourished.  Abdominal: Soft. Normal appearance.    Neurological: She is alert and oriented to person, place, and time.  Skin: Skin is warm and dry.  Psychiatric: Her behavior is normal.    Data Reviewed A. GALLBLADDER, CHOLECYSTECTOMY:  - CHRONIC CHOLECYSTITIS, CHOLESTEROLOSIS AND CHOLELITHIASIS.   Assessment    Doing well post cholecystectomy.  Candidate for screening colonoscopy.    Plan     We will request records from [facility] as to timing for her next colonoscopy.  Colonoscopy with possible biopsy/polypectomy prn: Information regarding the procedure, including its potential risks and complications (including but not limited to perforation of the bowel, which may require emergency surgery to repair, and bleeding) was verbally given to the patient. Educational information regarding lower intestinal endoscopy was given to the patient. Written instructions for how to complete the bowel prep using Miralax were provided. The importance of drinking ample fluids to avoid dehydration as a result of the prep emphasized.    HPI, Physical Exam, Assessment and Plan have been scribed under the direction and in the presence of Robert Bellow, MD. Karie Fetch, RN  I have completed the exam and reviewed the above documentation for accuracy  and completeness.  I agree with the above.  Haematologist has been used and any errors in dictation or transcription are unintentional.  Hervey Ard, M.D., F.A.C.S.  Courtney Ross 05/13/2018, 9:16 PM

## 2018-05-13 NOTE — Patient Instructions (Addendum)
The patient is aware to call back for any questions or new concerns. Obtain copy of colonoscopy   Colonoscopy, Adult A colonoscopy is an exam to look at the entire large intestine. During the exam, a lubricated, bendable tube is inserted into the anus and then passed into the rectum, colon, and other parts of the large intestine. A colonoscopy is often done as a part of normal colorectal screening or in response to certain symptoms, such as anemia, persistent diarrhea, abdominal pain, and blood in the stool. The exam can help screen for and diagnose medical problems, including:  Tumors.  Polyps.  Inflammation.  Areas of bleeding.  Tell a health care provider about:  Any allergies you have.  All medicines you are taking, including vitamins, herbs, eye drops, creams, and over-the-counter medicines.  Any problems you or family members have had with anesthetic medicines.  Any blood disorders you have.  Any surgeries you have had.  Any medical conditions you have.  Any problems you have had passing stool. What are the risks? Generally, this is a safe procedure. However, problems may occur, including:  Bleeding.  A tear in the intestine.  A reaction to medicines given during the exam.  Infection (rare).  What happens before the procedure? Eating and drinking restrictions Follow instructions from your health care provider about eating and drinking, which may include:  A few days before the procedure - follow a low-fiber diet. Avoid nuts, seeds, dried fruit, raw fruits, and vegetables.  1-3 days before the procedure - follow a clear liquid diet. Drink only clear liquids, such as clear broth or bouillon, black coffee or tea, clear juice, clear soft drinks or sports drinks, gelatin dessert, and popsicles. Avoid any liquids that contain red or purple dye.  On the day of the procedure - do not eat or drink anything during the 2 hours before the procedure, or within the time  period that your health care provider recommends.  Bowel prep If you were prescribed an oral bowel prep to clean out your colon:  Take it as told by your health care provider. Starting the day before your procedure, you will need to drink a large amount of medicated liquid. The liquid will cause you to have multiple loose stools until your stool is almost clear or light green.  If your skin or anus gets irritated from diarrhea, you may use these to relieve the irritation: ? Medicated wipes, such as adult wet wipes with aloe and vitamin E. ? A skin soothing-product like petroleum jelly.  If you vomit while drinking the bowel prep, take a break for up to 60 minutes and then begin the bowel prep again. If vomiting continues and you cannot take the bowel prep without vomiting, call your health care provider.  General instructions  Ask your health care provider about changing or stopping your regular medicines. This is especially important if you are taking diabetes medicines or blood thinners.  Plan to have someone take you home from the hospital or clinic. What happens during the procedure?  An IV tube may be inserted into one of your veins.  You will be given medicine to help you relax (sedative).  To reduce your risk of infection: ? Your health care team will wash or sanitize their hands. ? Your anal area will be washed with soap.  You will be asked to lie on your side with your knees bent.  Your health care provider will lubricate a long, thin, flexible tube. The  tube will have a camera and a light on the end.  The tube will be inserted into your anus.  The tube will be gently eased through your rectum and colon.  Air will be delivered into your colon to keep it open. You may feel some pressure or cramping.  The camera will be used to take images during the procedure.  A small tissue sample may be removed from your body to be examined under a microscope (biopsy). If any  potential problems are found, the tissue will be sent to a lab for testing.  If small polyps are found, your health care provider may remove them and have them checked for cancer cells.  The tube that was inserted into your anus will be slowly removed. The procedure may vary among health care providers and hospitals. What happens after the procedure?  Your blood pressure, heart rate, breathing rate, and blood oxygen level will be monitored until the medicines you were given have worn off.  Do not drive for 24 hours after the exam.  You may have a small amount of blood in your stool.  You may pass gas and have mild abdominal cramping or bloating due to the air that was used to inflate your colon during the exam.  It is up to you to get the results of your procedure. Ask your health care provider, or the department performing the procedure, when your results will be ready. This information is not intended to replace advice given to you by your health care provider. Make sure you discuss any questions you have with your health care provider. Document Released: 06/06/2000 Document Revised: 04/09/2016 Document Reviewed: 08/21/2015 Elsevier Interactive Patient Education  2018 [employer].

## 2018-05-19 ENCOUNTER — Other Ambulatory Visit: Payer: Self-pay | Admitting: Family Medicine

## 2018-05-19 MED ORDER — OXYCODONE HCL 15 MG PO TABS: 15.0000 mg | ORAL_TABLET | Freq: Four times a day (QID) | ORAL | 0 refills | Status: DC | PRN

## 2018-05-19 NOTE — Telephone Encounter (Signed)
Patient needs refill on Oxycodone 15 mg.  Sent to [facility].

## 2018-05-25 ENCOUNTER — Encounter: Payer: Medicare Other | Admitting: Obstetrics and Gynecology

## 2018-05-28 ENCOUNTER — Encounter: Payer: Self-pay | Admitting: Obstetrics and Gynecology

## 2018-05-31 ENCOUNTER — Ambulatory Visit (INDEPENDENT_AMBULATORY_CARE_PROVIDER_SITE_OTHER): Payer: Medicare Other | Admitting: Obstetrics and Gynecology

## 2018-05-31 ENCOUNTER — Encounter: Payer: Self-pay | Admitting: Obstetrics and Gynecology

## 2018-05-31 DIAGNOSIS — E669 Obesity, unspecified: Secondary | ICD-10-CM

## 2018-05-31 DIAGNOSIS — Z6831 Body mass index (BMI) 31.0-31.9, adult: Secondary | ICD-10-CM

## 2018-05-31 MED ORDER — PHENTERMINE HCL 15 MG PO CAPS: 15.0000 mg | ORAL_CAPSULE | ORAL | 2 refills | Status: DC

## 2018-05-31 NOTE — Progress Notes (Signed)
  Here for weight check and needs refill on phentermine 15mg  capsules. Doing well and desires continuing until getting BMI <23.  Weight 163lb 14oz today.  Previous weight 172lbs.   Waist 35.5 inches  Medication refilled and pharmacists gives B12 injection.   Melody Shambley,CNM

## 2018-06-07 NOTE — Progress Notes (Signed)
ANNUAL PREVENTATIVE CARE GYN  ENCOUNTER NOTE  Subjective:       Courtney Ross is a 62 y.o. [phone] female here for a routine annual gynecologic exam.  Current complaints: None  [name] is status post TAH.  She is status post posterior colporrhaphy with enterocele ligation.  Bowel function is notable for IBS symptomatology, but is stable.  Bladder function is normal.  She is not experiencing any significant vasomotor symptoms on ERT-estradiol 1 mg a day.  She is taking calcium with vitamin D supplementation. This year [name] has had her gallbladder out.  She is going to [facility] for evaluation and management of scoliosis this spring.   Gynecologic History No LMP recorded. Patient has had a hysterectomy. Contraception: Status post TAH Last Pap: 04/10/2016 neg Last mammogram: 06/04/2017. Results were: Negative Birads 1   Obstetric History OB History  Gravida Para Term Preterm AB Living  7 4 4   3 4   SAB TAB Ectopic Multiple Live Births  2   1   4     # Outcome Date GA Lbr Len/2nd Weight Sex Delivery Anes PTL Lv  7 Term 1984   6 lb 6.4 oz (2.903 kg) F Vag-Spont   LIV  6 Term 1980   8 lb 4.8 oz (3.765 kg) M Vag-Spont   LIV  5 Term 1977   7 lb (3.175 kg) M Vag-Spont   LIV  4 Term 1972   7 lb 9.6 oz (3.447 kg) M Vag-Spont   LIV  3 Ectopic           2 SAB           1 SAB             Past Medical History:  Diagnosis Date  . Anxiety   . Back pain   . Colon polyp   . Constipation   . Degenerative disc disease, lumbar   . Depression   . Family history of adverse reaction to anesthesia    daughter nausea  . Fatty liver   . GERD (gastroesophageal reflux disease)   . H/O spinal fusion   . Headache    migraines  . Headache, migraine 05/08/2015  . High cholesterol   . Hypertension   . Lower back pain   . Polycystic ovarian disease   . PONV (postoperative nausea and vomiting)   . Restless leg syndrome   . Spasm 01/29/2009    Past Surgical History:  Procedure Laterality  Date  . [place]  . APPENDECTOMY  1988  . ARTHRODESIS METATARSALPHALANGEAL JOINT (MTPJ) Left 12/14/2014   Procedure: ARTHRODESIS METATARSALPHALANGEAL JOINT (MTPJ) 1ST;  Surgeon: Albertine Patricia, DPM;  Location: [place];  Service: Podiatry;  Laterality: Left;  LMA  . BACK SURGERY  [id number]   x3  . BLADDER SUSPENSION  2006  . BREAST BIOPSY Left 1990's  . BREAST SURGERY  2005   lumpectomy  . CARPAL TUNNEL RELEASE Bilateral 02/26/07,05/21/07  . CHOLECYSTECTOMY N/A 05/03/2018   Procedure: LAPAROSCOPIC CHOLECYSTECTOMY WITH INTRAOPERATIVE CHOLANGIOGRAM;  Surgeon: Robert Bellow, MD;  Location: [facility];  Service: General;  Laterality: N/A;  . COLONOSCOPY    . COLPORRHAPHY    . DILATION AND CURETTAGE OF UTERUS    . [place]  . FOOT SURGERY    . HAMMER TOE SURGERY Left 03/22/2015   Procedure: HAMMER TOE CORRECTION L 2ND AND 3RD ;  Surgeon: Albertine Patricia, DPM;  Location: [facility];  Service: Podiatry;  Laterality: Left;  WITH LOCAL  . HERNIA REPAIR Right 1988   inguinal  . NECK SURGERY  2006   fusion  . RECTOCELE REPAIR N/A 08/25/2016   Procedure: POSTERIOR REPAIR (RECTOCELE);  Surgeon: Brayton Mars, MD;  Location: [facility];  Service: Gynecology;  Laterality: N/A;  . SPINAL CORD STIMULATOR IMPLANT    . TONSILLECTOMY  1985    Current Outpatient Medications on File Prior to Visit  Medication Sig Dispense Refill  . AMITIZA 24 MCG capsule TAKE 1 CAPSULE BY MOUTH TWICE DAILY WITHA MEAL 60 capsule 4  . aspirin EC 81 MG tablet Take 81 mg by mouth at bedtime.    Marland Kitchen buPROPion (WELLBUTRIN SR) 150 MG 12 hr tablet Take 1 tablet (150 mg total) by mouth 2 (two) times daily. 60 tablet 11  . Calcium Carbonate (CALCI-CHEW PO) Take 2 tablets by mouth daily.    . Cholecalciferol (VITAMIN D3) 5000 UNITS CAPS Take 5,000 Units by mouth daily.     . Cyanocobalamin (B-12 COMPLIANCE INJECTION IJ) Inject 1 Dose as directed every 30  (thirty) days.    . cyclobenzaprine (FLEXERIL) 10 MG tablet Take 10 mg by mouth 3 (three) times daily as needed for muscle spasms.    . diazepam (VALIUM) 10 MG tablet Take 1 tablet (10 mg total) by mouth every 6 (six) hours as needed for anxiety. 120 tablet 4  . docusate sodium (COLACE) 100 MG capsule Take 100 mg by mouth daily.    Marland Kitchen estradiol (ESTRACE) 1 MG tablet TAKE ONE TABLET BY MOUTH EVERY DAY 90 tablet 1  . fluticasone (FLONASE) 50 MCG/ACT nasal spray Place 1 spray into both nostrils daily as needed for allergies or rhinitis.    . hydroquinone 4 % cream Apply 1 application topically daily as needed (dark spots).   1  . meloxicam (MOBIC) 15 MG tablet Take 15 mg by mouth at bedtime.     . Multiple Vitamin (MULTIVITAMIN WITH MINERALS) TABS tablet Take 1 tablet by mouth daily.    . mupirocin cream (BACTROBAN) 2 % Apply 1 application topically 2 (two) times daily. (Patient taking differently: Apply 1 application topically 2 (two) times daily as needed (sores in nose). ) 15 g 1  . omeprazole (PRILOSEC) 40 MG capsule Take 1 capsule (40 mg total) by mouth daily. 30 capsule 1  . ondansetron (ZOFRAN-ODT) 4 MG disintegrating tablet Take 4 mg by mouth every 8 (eight) hours as needed for nausea or vomiting.    . ondansetron (ZOFRAN-ODT) 4 MG disintegrating tablet Take 1 tablet (4 mg total) by mouth every 8 (eight) hours as needed for nausea or vomiting. 12 tablet 0  . oxyCODONE (ROXICODONE) 15 MG immediate release tablet Take 1-2 tablets (15-30 mg total) by mouth 4 (four) times daily as needed. 180 tablet 0  . phentermine 15 MG capsule Take 1 capsule (15 mg total) by mouth every morning. 30 capsule 2  . polyethylene glycol (MIRALAX / GLYCOLAX) packet Take 17 g by mouth daily as needed for mild constipation.     . pregabalin (LYRICA) 75 MG capsule TAKE 1 CAPSULE BY MOUTH 3 TIMES DAILY 90 capsule 4  . RESTASIS 0.05 % ophthalmic emulsion Apply 1 drop to eye 2 (two) times daily.    Marland Kitchen rOPINIRole (REQUIP) 1 MG  tablet Take 1 tablet (1 mg total) by mouth at bedtime. 30 tablet 11   No current facility-administered medications on file prior to visit.     Allergies  Allergen Reactions  . Hydrocodone Itching  . Lisinopril Cough  . Mirtazapine     Weight gain  . Other Nausea And Vomiting    general anesthesia    Social History   Socioeconomic History  . Marital status: Legally Separated    Spouse name: Not on file  . Number of children: 4  . Years of education: Not on file  . Highest education level: 9th grade  Occupational History  . Occupation: disabled  Social Needs  . Financial resource strain: Hard  . Food insecurity:    Worry: Sometimes true    Inability: Sometimes true  . Transportation needs:    Medical: No    Non-medical: No  Tobacco Use  . Smoking status: Never Smoker  . Smokeless tobacco: Never Used  Substance and Sexual Activity  . Alcohol use: No    Alcohol/week: 0.0 standard drinks    Frequency: Never  . Drug use: No  . Sexual activity: Yes    Birth control/protection: Surgical  Lifestyle  . Physical activity:    Days per week: Not on file    Minutes per session: Not on file  . Stress: Not at all  Relationships  . Social connections:    Talks on phone: Not on file    Gets together: Not on file    Attends religious service: Not on file    Active member of club or organization: Not on file    Attends meetings of clubs or organizations: Not on file    Relationship status: Not on file  . Intimate partner violence:    Fear of current or ex partner: No    Emotionally abused: No    Physically abused: No    Forced sexual activity: No  Other Topics Concern  . Not on file  Social History Narrative   Pt is currently receiving assistance from [employer] and gets food stamps.     Family History  Problem Relation Age of Onset  . Diabetes Father   . Hypertension Father   . CAD Father   . Diabetes Sister        non-Insulin dependent diabetes mellitus  . Heart  disease Brother   . Cancer Daughter        cancer of the cervix at age 11-18  . Heart disease Brother   . Breast cancer Paternal Aunt   . Colon cancer Paternal Uncle   . Ovarian cancer Neg Hx     The following portions of the patient's history were reviewed and updated as appropriate: allergies, current medications, past family history, past medical history, past social history, past surgical history and problem list.  Review of Systems Review of Systems  Constitutional: Negative.   Respiratory: Negative.        Mild right flank discomfort-sharp pain with inspiration  Cardiovascular: Negative.   Gastrointestinal: Negative.   Genitourinary: Negative.   Musculoskeletal: Negative.   Skin: Negative.   Neurological: Negative.   Psychiatric/Behavioral: Negative.   All other systems reviewed and are negative.   Objective:   BP (!) 149/91   Pulse 85   Ht 5\' 8"  (1.727 m)   Wt 165 lb 3.2 oz (74.9 kg)   BMI 25.12 kg/m  CONSTITUTIONAL: Well-developed, well-nourished female in no acute distress.  PSYCHIATRIC: Normal mood and affect. Normal behavior. Normal judgment and thought content. [place]: Alert and oriented to person, place, and time. Normal muscle tone coordination. No cranial nerve deficit noted. HENT:  Normocephalic, atraumatic, External right and left  ear normal.  EYES: Conjunctivae and EOM are normal. No scleral icterus.  NECK: Normal range of motion, supple, no masses.  Normal thyroid.  SKIN: Skin is warm and dry. No rash noted. Not diaphoretic. No erythema. No pallor. CARDIOVASCULAR: Normal heart rate noted, regular rhythm, no murmur. RESPIRATORY: Clear to auscultation bilaterally. Effort and breath sounds normal, no problems with respiration noted.  Respiratory rate is normal. BREASTS: Symmetric in size. No masses, skin changes, nipple drainage, or lymphadenopathy. ABDOMEN: Soft, normal bowel sounds, no distention noted.  No tenderness, rebound or guarding.   Laparoscopic gallbladder incisions are well-healed.  No hernias. BLADDER: Normal PELVIC:  External Genitalia: Normal  BUS: Normal  Vagina: Normal estrogen effect  Cervix: Surgically absent  Uterus: Surgically absent  Adnexa: Nonpalpable and nontender  RV:  MUSCULOSKELETAL: Normal range of motion. No tenderness.  No cyanosis, clubbing, or edema.  2+ distal pulses. LYMPHATIC: No Axillary, Supraclavicular, or Inguinal Adenopathy.    Assessment:   Annual gynecologic examination 62 y.o. Contraception: TAH BMI: 25 IBS Scoliosis, undergoing work-up Status post laparoscopic cholecystectomy Menopausal, asymptomatic on ERT  Plan:  PAP: Due 2020 Mammogram: ordered  Stool Guaiac Testing:    Labs: PCP Routine preventative health maintenance measures emphasized. Refill estradiol 1 mg daily for 1 year Continue with calcium and vitamin D supplementation twice daily Return to [place], [place] acting as scribe for Dr. Enzo Bi. I have reviewed, updated, and concur with the information scribed. Brayton Mars, MD   Note: This dictation was prepared with Dragon dictation along with smaller phrase technology. Any transcriptional errors that result from this process are unintentional.

## 2018-06-08 DIAGNOSIS — H524 Presbyopia: Secondary | ICD-10-CM | POA: Diagnosis not present

## 2018-06-10 ENCOUNTER — Ambulatory Visit (INDEPENDENT_AMBULATORY_CARE_PROVIDER_SITE_OTHER): Payer: Medicare Other | Admitting: Obstetrics and Gynecology

## 2018-06-10 ENCOUNTER — Encounter: Payer: Self-pay | Admitting: Obstetrics and Gynecology

## 2018-06-10 VITALS — BP 149/91 | HR 85 | Ht 68.0 in | Wt 165.2 lb

## 2018-06-10 DIAGNOSIS — Z1239 Encounter for other screening for malignant neoplasm of breast: Secondary | ICD-10-CM

## 2018-06-10 DIAGNOSIS — Z780 Asymptomatic menopausal state: Secondary | ICD-10-CM | POA: Diagnosis not present

## 2018-06-10 DIAGNOSIS — Z09 Encounter for follow-up examination after completed treatment for conditions other than malignant neoplasm: Secondary | ICD-10-CM

## 2018-06-10 DIAGNOSIS — K644 Residual hemorrhoidal skin tags: Secondary | ICD-10-CM

## 2018-06-10 DIAGNOSIS — Z90710 Acquired absence of both cervix and uterus: Secondary | ICD-10-CM

## 2018-06-10 DIAGNOSIS — Z01419 Encounter for gynecological examination (general) (routine) without abnormal findings: Secondary | ICD-10-CM

## 2018-06-10 MED ORDER — ESTRADIOL 1 MG PO TABS: 1.0000 mg | ORAL_TABLET | Freq: Every day | ORAL | 1 refills | Status: DC

## 2018-06-10 NOTE — Patient Instructions (Signed)
1.  Pap smear is not done today.  Next Pap smear is due 2020. 2.  Mammogram is ordered. 3.  Stool guaiac card testing is ordered for colon cancer screening 4.  Screening labs are through primary care. 5.  Continue with healthy eating and exercise. 6.  Continue with calcium and vitamin D supplementation twice a day 7.  Estradiol is refilled for 1 year - 1 mg daily 8.  Return in 1 year for annual exam-[facility] Maintenance for Postmenopausal Women Menopause is a normal process in which your reproductive ability comes to an end. This process happens gradually over a span of months to years, usually between the ages of 12 and 27. Menopause is complete when you have missed 12 consecutive menstrual periods. It is important to talk with your health care provider about some of the most common conditions that affect postmenopausal women, such as heart disease, cancer, and bone loss (osteoporosis). Adopting a healthy lifestyle and getting preventive care can help to promote your health and wellness. Those actions can also lower your chances of developing some of these common conditions. What should I know about menopause? During menopause, you may experience a number of symptoms, such as:  Moderate-to-severe hot flashes.  Night sweats.  Decrease in sex drive.  Mood swings.  Headaches.  Tiredness.  Irritability.  Memory problems.  Insomnia. Choosing to treat or not to treat menopausal changes is an individual decision that you make with your health care provider. What should I know about hormone replacement therapy and supplements? Hormone therapy products are effective for treating symptoms that are associated with menopause, such as hot flashes and night sweats. Hormone replacement carries certain risks, especially as you become older. If you are thinking about using estrogen or estrogen with progestin treatments, discuss the benefits and risks with your health care  provider. What should I know about heart disease and stroke? Heart disease, heart attack, and stroke become more likely as you age. This may be due, in part, to the hormonal changes that your body experiences during menopause. These can affect how your body processes dietary fats, triglycerides, and cholesterol. Heart attack and stroke are both medical emergencies. There are many things that you can do to help prevent heart disease and stroke:  Have your blood pressure checked at least every 1-2 years. High blood pressure causes heart disease and increases the risk of stroke.  If you are 48-62 years old, ask your health care provider if you should take aspirin to prevent a heart attack or a stroke.  Do not use any tobacco products, including cigarettes, chewing tobacco, or electronic cigarettes. If you need help quitting, ask your health care provider.  It is important to eat a healthy diet and maintain a healthy weight. ? Be sure to include plenty of vegetables, fruits, low-fat dairy products, and lean protein. ? Avoid eating foods that are high in solid fats, added sugars, or salt (sodium).  Get regular exercise. This is one of the most important things that you can do for your health. ? Try to exercise for at least 150 minutes each week. The type of exercise that you do should increase your heart rate and make you sweat. This is known as moderate-intensity exercise. ? Try to do strengthening exercises at least twice each week. Do these in addition to the moderate-intensity exercise.  Know your numbers.Ask your health care provider to check your cholesterol and your blood glucose. Continue to have your blood  tested as directed by your health care provider.  What should I know about cancer screening? There are several types of cancer. Take the following steps to reduce your risk and to catch any cancer development as early as possible. Breast Cancer  Practice breast self-awareness. ? This  means understanding how your breasts normally appear and feel. ? It also means doing regular breast self-exams. Let your health care provider know about any changes, no matter how small.  If you are 65 or older, have a clinician do a breast exam (clinical breast exam or CBE) every year. Depending on your age, family history, and medical history, it may be recommended that you also have a yearly breast X-ray (mammogram).  If you have a family history of breast cancer, talk with your health care provider about genetic screening.  If you are at high risk for breast cancer, talk with your health care provider about having an MRI and a mammogram every year.  Breast cancer (BRCA) gene test is recommended for women who have family members with BRCA-related cancers. Results of the assessment will determine the need for genetic counseling and BRCA1 and for BRCA2 testing. BRCA-related cancers include these types: ? Breast. This occurs in males or females. ? Ovarian. ? Tubal. This may also be called fallopian tube cancer. ? Cancer of the abdominal or pelvic lining (peritoneal cancer). ? Prostate. ? Pancreatic. Cervical, Uterine, and Ovarian Cancer Your health care provider may recommend that you be screened regularly for cancer of the pelvic organs. These include your ovaries, uterus, and vagina. This screening involves a pelvic exam, which includes checking for microscopic changes to the surface of your cervix (Pap test).  For women ages 21-65, health care providers may recommend a pelvic exam and a Pap test every three years. For women ages 39-65, they may recommend the Pap test and pelvic exam, combined with testing for human papilloma virus (HPV), every five years. Some types of HPV increase your risk of cervical cancer. Testing for HPV may also be done on women of any age who have unclear Pap test results.  Other health care providers may not recommend any screening for nonpregnant women who are  considered low risk for pelvic cancer and have no symptoms. Ask your health care provider if a screening pelvic exam is right for you.  If you have had past treatment for cervical cancer or a condition that could lead to cancer, you need Pap tests and screening for cancer for at least 20 years after your treatment. If Pap tests have been discontinued for you, your risk factors (such as having a new sexual partner) need to be reassessed to determine if you should start having screenings again. Some women have medical problems that increase the chance of getting cervical cancer. In these cases, your health care provider may recommend that you have screening and Pap tests more often.  If you have a family history of uterine cancer or ovarian cancer, talk with your health care provider about genetic screening.  If you have vaginal bleeding after reaching menopause, tell your health care provider.  There are currently no reliable tests available to screen for ovarian cancer. Lung Cancer Lung cancer screening is recommended for adults 61-8 years old who are at high risk for lung cancer because of a history of smoking. A yearly low-dose CT scan of the lungs is recommended if you:  Currently smoke.  Have a history of at least 30 pack-years of smoking and you currently  smoke or have quit within the past 15 years. A pack-year is smoking an average of one pack of cigarettes per day for one year. Yearly screening should:  Continue until it has been 15 years since you quit.  Stop if you develop a health problem that would prevent you from having lung cancer treatment. Colorectal Cancer  This type of cancer can be detected and can often be prevented.  Routine colorectal cancer screening usually begins at age 70 and continues through age 44.  If you have risk factors for colon cancer, your health care provider may recommend that you be screened at an earlier age.  If you have a family history of  colorectal cancer, talk with your health care provider about genetic screening.  Your health care provider may also recommend using home test kits to check for hidden blood in your stool.  A small camera at the end of a tube can be used to examine your colon directly (sigmoidoscopy or colonoscopy). This is done to check for the earliest forms of colorectal cancer.  Direct examination of the colon should be repeated every 5-10 years until age 4. However, if early forms of precancerous polyps or small growths are found or if you have a family history or genetic risk for colorectal cancer, you may need to be screened more often. Skin Cancer  Check your skin from head to toe regularly.  Monitor any moles. Be sure to tell your health care provider: ? About any new moles or changes in moles, especially if there is a change in a mole's shape or color. ? If you have a mole that is larger than the size of a pencil eraser.  If any of your family members has a history of skin cancer, especially at a young age, talk with your health care provider about genetic screening.  Always use sunscreen. Apply sunscreen liberally and repeatedly throughout the day.  Whenever you are outside, protect yourself by wearing long sleeves, pants, a wide-brimmed hat, and sunglasses. What should I know about osteoporosis? Osteoporosis is a condition in which bone destruction happens more quickly than new bone creation. After menopause, you may be at an increased risk for osteoporosis. To help prevent osteoporosis or the bone fractures that can happen because of osteoporosis, the following is recommended:  If you are 73-84 years old, get at least 1,000 mg of calcium and at least 600 mg of vitamin D per day.  If you are older than age 51 but younger than age 44, get at least 1,200 mg of calcium and at least 600 mg of vitamin D per day.  If you are older than age 28, get at least 1,200 mg of calcium and at least 800 mg of  vitamin D per day. Smoking and excessive alcohol intake increase the risk of osteoporosis. Eat foods that are rich in calcium and vitamin D, and do weight-bearing exercises several times each week as directed by your health care provider. What should I know about how menopause affects my mental health? Depression may occur at any age, but it is more common as you become older. Common symptoms of depression include:  Low or sad mood.  Changes in sleep patterns.  Changes in appetite or eating patterns.  Feeling an overall lack of motivation or enjoyment of activities that you previously enjoyed.  Frequent crying spells. Talk with your health care provider if you think that you are experiencing depression. What should I know about immunizations? It is  important that you get and maintain your immunizations. These include:  Tetanus, diphtheria, and pertussis (Tdap) booster vaccine.  Influenza every year before the flu season begins.  Pneumonia vaccine.  Shingles vaccine. Your health care provider may also recommend other immunizations. This information is not intended to replace advice given to you by your health care provider. Make sure you discuss any questions you have with your health care provider. Document Released: 08/01/2005 Document Revised: 12/28/2015 Document Reviewed: 03/13/2015 Elsevier Interactive Patient Education  2019 [employer].

## 2018-06-11 ENCOUNTER — Other Ambulatory Visit: Payer: Self-pay | Admitting: Family Medicine

## 2018-06-14 ENCOUNTER — Other Ambulatory Visit: Payer: Self-pay | Admitting: Family Medicine

## 2018-06-19 ENCOUNTER — Other Ambulatory Visit: Payer: Self-pay | Admitting: Family Medicine

## 2018-06-19 NOTE — Telephone Encounter (Signed)
Patient is requesting refill on Oxycodone 15 mg. sent [facility]

## 2018-06-21 MED ORDER — OXYCODONE HCL 15 MG PO TABS: 15.0000 mg | ORAL_TABLET | Freq: Four times a day (QID) | ORAL | 0 refills | Status: DC | PRN

## 2018-06-21 NOTE — Telephone Encounter (Signed)
Looks like patient last office visit was 04/16/2017. Please advise.

## 2018-06-22 ENCOUNTER — Ambulatory Visit
Admission: RE | Admit: 2018-06-22 | Discharge: 2018-06-22 | Disposition: A | Discharge status: Home / Self Care | Payer: Medicare Other | Source: Ambulatory Visit | Attending: Obstetrics and Gynecology | Admitting: Obstetrics and Gynecology

## 2018-06-22 DIAGNOSIS — Z1231 Encounter for screening mammogram for malignant neoplasm of breast: Secondary | ICD-10-CM | POA: Diagnosis not present

## 2018-06-22 DIAGNOSIS — Z1239 Encounter for other screening for malignant neoplasm of breast: Secondary | ICD-10-CM

## 2018-06-28 ENCOUNTER — Encounter: Payer: Self-pay | Admitting: Physical Therapy

## 2018-06-28 ENCOUNTER — Ambulatory Visit: Payer: Medicare Other | Attending: Neurosurgery | Admitting: Physical Therapy

## 2018-06-28 ENCOUNTER — Other Ambulatory Visit: Payer: Self-pay

## 2018-06-28 DIAGNOSIS — G8929 Other chronic pain: Secondary | ICD-10-CM | POA: Diagnosis not present

## 2018-06-28 DIAGNOSIS — M25551 Pain in right hip: Secondary | ICD-10-CM | POA: Insufficient documentation

## 2018-06-28 DIAGNOSIS — M5441 Lumbago with sciatica, right side: Secondary | ICD-10-CM | POA: Insufficient documentation

## 2018-06-28 DIAGNOSIS — R293 Abnormal posture: Secondary | ICD-10-CM | POA: Diagnosis not present

## 2018-06-28 DIAGNOSIS — R202 Paresthesia of skin: Secondary | ICD-10-CM | POA: Diagnosis not present

## 2018-06-28 DIAGNOSIS — M6281 Muscle weakness (generalized): Secondary | ICD-10-CM | POA: Diagnosis not present

## 2018-06-28 NOTE — Therapy (Signed)
[facility] [address] [place], [address] Phone: [phone]   Fax:  [phone]  Physical Therapy Evaluation  Patient Details  Name: Courtney Ross MRN: [id number] Date of Birth: Jun 17, 1956 Referring Provider (PT):  Juanetta Beets Elayne Snare, MD.    Encounter Date: 06/28/2018  PT End of Session - 06/28/18 1953    Visit Number  1    Number of Visits  16    Date for PT Re-Evaluation  08/23/18    Authorization Type  [employer]    Authorization Time Period  Current cert period: 10/21/256 - 08/23/2018    Authorization - Visit Number  1    Authorization - Number of Visits  10    PT Start Time  1430    PT Stop Time  1530    PT Time Calculation (min)  60 min    Activity Tolerance  Patient tolerated treatment well;Patient limited by pain    Behavior During Therapy  [facility] for tasks assessed/performed       Past Medical History:  Diagnosis Date  . Anxiety   . Back pain   . Colon polyp   . Constipation   . Degenerative disc disease, lumbar   . Depression   . Family history of adverse reaction to anesthesia    daughter nausea  . Fatty liver   . GERD (gastroesophageal reflux disease)   . H/O spinal fusion   . Headache    migraines  . Headache, migraine 05/08/2015  . High cholesterol   . Hypertension   . Lower back pain   . Polycystic ovarian disease   . PONV (postoperative nausea and vomiting)   . Restless leg syndrome   . Spasm 01/29/2009    Past Surgical History:  Procedure Laterality Date  . [place]BD[place]ST[place]CTOMY      [place]  . APPENDECTOMY  1988  . ARTHRODESIS METATARSALPHALANGEAL JOINT (MTPJ) Left 12/14/2014   Procedure: ARTHRODESIS METATARSALPHALANGEAL JOINT (MTPJ) 1ST;  Surgeon: Albertine Patricia, DPM;  Location: [place];  Service: Podiatry;  Laterality: Left;  LMA  . BACK SURGERY  [phone]   x3  . BLADDER SUSPENSION  2006  . BREAST BIOPSY Left 1990's   core [place]  . BREAST SURGERY  2005    lumpectomy  . CARPAL TUNNEL RELEASE Bilateral 02/26/07,05/21/07  . CHOLECYSTECTOMY N/A 05/03/2018   Procedure: LAPAROSCOPIC CHOLECYSTECTOMY WITH INTRAOPERATIVE CHOLANGIOGRAM;  Surgeon: Robert Bellow, MD;  Location: [facility];  Service: General;  Laterality: N/A;  . COLONOSCOPY    . COLPORRHAPHY    . DILATION AND CURETTAGE OF UTERUS    . [place]  . FOOT SURGERY    . HAMMER TOE SURGERY Left 03/22/2015   Procedure: HAMMER TOE CORRECTION L 2ND AND 3RD ;  Surgeon: Albertine Patricia, DPM;  Location: [place];  Service: Podiatry;  Laterality: Left;  WITH LOCAL  . HERNIA REPAIR Right 1988   inguinal  . NECK SURGERY  2006   fusion  . RECTOCELE REPAIR N/A 08/25/2016   Procedure: POSTERIOR REPAIR (RECTOCELE);  Surgeon: Brayton Mars, MD;  Location: [facility];  Service: Gynecology;  Laterality: N/A;  . SPINAL CORD STIMULATOR IMPLANT    . TONSILLECTOMY  1985    There were no vitals filed for this visit.   Subjective Assessment - 06/28/18 1502    Subjective  Patient reports she has a history of adolescent idiopathic scoliosis that was not identified as a child. She  has had prior back and neck surgeries for this condition. She has noticed her pain worsening in the past couple of years. She started walking with her daughter in March 2019. Got up to 5 miles a day, now down to 1 mile a day due to weather. Walking seems to help, sitting is worse. She was referred to a physician at [facility] because Dr. Carloyn Manner is retiring. That physician referred her to PT first for aquatics, then return for myelogram (she cannot do an MRI due to having a nerve stimulator). She also needs a bone density test to see if her bones would be able to receive rods that the surgeon wants to place.  The plan is to progress to surgery for rod placement if possible.She currently has a lifting restriction that she cannot remember but is not allowed to pick up her 39 month old grandson. She has ordered a  corset-looking brace, that helps some. Exercise: walking 1-5 miles per day, going to [facility] and exercising independently in pool 3x per week for 1 hour (has stopped since thanksgiving due to business of holidays).    Pertinent History  Patient is a 63 y.o. female who presents to outpatient physical therapy with a referral for medical diagnosis adolescent idiopathic scoliosis of lumbar region with request to include aquatic therapy in her POC. This patient's chief complaints consist of pain, paresthesia, stiffness, weakness, decreased function leading to the following functional deficits: difficulty with basic ADLs, IADLs, walking, sitting, standing, lifting. Relevant past medical history and comorbidities: Does have a history of lumbar surgery (1993 for ruptured disc, spinal fusion in back and neck last surgery about 10 years ago). History of migraines (now rare).  History of inguinal nerve pain induced by surgery for atopic pregnancy that lead to right groin pain and associated with spinal cord stimulator implant, history of "aflicted feet" (bilateral bunionectomy), bilateral carpal tunnel release. Denies history of cancer, diabetes, heart problems, lung problems, seizures.     Limitations  Sitting;Lifting;Standing;House hold activities;Other (comment)   limiting her ability to lift her grandchildren, riding in the car and traveling, spending time with family, home and yard care. Sleeps through the night with sleep medication.   How long can you sit comfortably?  <20 min (leaning away from right side)    How long can you stand comfortably?  <15 min    How long can you walk comfortably?  < 1-5 miles (may be over 2 times)    Diagnostic tests  Lumbar and Thoracic CT scan WO contrast 02/15/2018 (see report for detail, 33 degrees of dextroscoliosis and 56 degress of kyphosis in thoracic spine and  21 degree levoscoliosis  mentiones right foraminal stenosis due to bne fragment at L5-5, lipoma of the filum terminale  unchanged).    Patient Stated Goals  "wants to have more movement without pain, more core strength, and more cardio"    Currently in Pain?  Yes    Pain Score  4     Pain Location  Back    Pain Orientation  Right;Lower    Pain Descriptors / Indicators  Burning;Tingling;Numbness   "hard pain" at left lumbar region   Pain Type  Chronic pain    Pain Radiating Towards  right lumbar spine and glute (constant), and sometimes in right hip, right groin, lateral leg to foot (intermitant)    Pain Onset  More than a month ago    Pain Frequency  Constant    Aggravating Factors   prolonged sitting or  standing, riding.     Pain Relieving Factors  walking (especially with something to hold on to buggy), laying down, taking medication.       SUBJECTIVE: HISTORY:  Patient is a 63 y.o. female who presents to outpatient physical therapy with a referral for medical diagnosis adolescent idiopathic scoliosis of lumbar region with request to include aquatic therapy in her POC. This patient's chief complaints consist of pain, paresthesia, stiffness, weakness, decreased function leading to the following functional deficits: difficulty with basic ADLs, IADLs, walking, sitting, standing, lifting. Relevant past medical history and comorbidities: Does have a history of lumbar surgery (1993 for ruptured disc, spinal fusion in back and neck last surgery about 10 years ago). History of migraines (now rare).  History of inguinal nerve pain induced by surgery for atopic pregnancy that lead to right groin pain and associated with spinal cord stimulator implant, history of "aflicted feet" (bilateral bunionectomy), bilateral carpal tunnel release. Denies history of cancer, diabetes, heart problems, lung problems, seizures.   Patient reports she has a history of adolescent idiopathic scoliosis that was not identified as a child. She has had prior back and neck surgeries for this condition. She has noticed her pain worsening in the  past couple of years. She started walking with her daughter in March 2019. Got up to 5 miles a day, now down to 1 mile a day due to weather. Walking seems to help, sitting is worse. She was referred to a physician at [facility] because Dr. Carloyn Manner is retiring. That physician referred her to PT first for aquatics, then return for myelogram (she cannot do an MRI due to having a nerve stimulator). She also needs a bone density test to see if her bones would be able to receive rods that the surgeon wants to place.  The plan is to progress to surgery for rod placement if possible.She currently has a lifting restriction that she cannot remember but is not allowed to pick up her 43 month old grandson. She has ordered a corset-looking brace, that helps some. Exercise: walking 1-5 miles per day, going to [facility] and exercising independently in pool 3x per week for 1 hour (has stopped since thanksgiving due to business of holidays).   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 Hobbies: spending time with 28 year old daughter, church, traveling. PLOF: independent, working full time.  Current Level of Function: limiting her ability to lift her grandchildren, riding in the car and traveling, spending time with family, home and yard care. Sleeps through the night with sleep medication.  PAIN Location: right lumbar spine and glute (constant), and sometimes in right hip, right groin, lateral leg to foot (intermitant) Nature: "hard pain" at left lumbar region. Burning in groin, numbness and tingling down leg. Pain Scale:  Patient reports current pain as 4/10 (after taking all her meds), at best 2/10, at worst 10/10 (last was yesterday after sitting in church) Paresthesia: left leg to foot Aggravating factors: prolonged sitting or standing, riding.  Easing factors: walking (especially with something to hold on to  buggy), laying down, taking medication.   [facility] PT Assessment - 06/28/18 0001      Assessment   Medical Diagnosis  Adolescent idiopathic scoliosis of lumbar region    Referring Provider (PT)   Juanetta Beets, Elayne Snare, MD.     Next MD Visit  Around Aug 03, 2018    Prior  Therapy  [facility] for low back pain with improvement several years ago      Precautions   Precautions  Other (comment)    Precaution Comments  lifting precautions (do not lift 41 month old)      Restrictions   Weight Bearing Restrictions  No      Balance Screen   Has the patient fallen in the past 6 months  No    Has the patient had a decrease in activity level because of a fear of falling?   No    Is the patient reluctant to leave their home because of a fear of falling?   No      Prior Function   Level of Independence  Independent    Vocation  On disability    Leisure  spending time with 47 year old daughter, church, traveling.      Cognition   Overall Cognitive Status  Within Functional Limits for tasks assessed      Observation/Other Assessments   Observations  See note from 06/28/2018 for latest objective data    Focus on Therapeutic Outcomes (FOTO)   40         OBJECTIVE: OBSERVATION/INSPECTION: Patient presents with rounded shoulders, flatten of lumbar lordosis, hyperkyphosis of upper thoracic spine with forward head and rounded shoulders. Spinal curves consistent with AIS noted with right thoracic convexity and sided rib hump, left lumbar convexity. Right iliac crest slightly higher than left.  NEUROLOGICAL: Dermatomes: diminished to light touch L3-S1 on right compared to left except for L4. S2 diminished to light touch bilaterally.  Myotomes: right S2 diminished, eversion ratcheting Reflexes:  - Quadriceps reflex (L4): R = 1+, L = 2+. - Achilles reflex (S1): R = absent, L = 2+. Upper Motor Neuron Screen: Babinski, Hoffman's, and Clonus (ankle) negative bilaterally.   SPINE MOTION Lumbar AROM:   *Indicates pain - Flexion: = mid shins, reproduced leg pain/tingling. - Extension: = 25 ERP. - Rotation: R = WFL, L = WFL, right pain. - Side Flexion: R = 25 painful on R, L = 50 painful R.  PERIPHERAL JOINT MOTION (AROM/PROM in degrees):  *Indicates pain Hip   - Left hip WFL but provokes left lumbar pain with end range overpressure.   - Right hip WFL except painful in most motions, limited extension, limited IR. Knee - Flexion: B WFL.  - Extension: B WFL Ankle: grossly WFL   STRENGTH:  *Indicates pain Hip  - Flexion: R = 3/5, L = 4/5. - Extension: R = 3/5 (motion restricted), L = 3/5 (painful to lift). - Abduction: R = 3/5, L = 4+/5. Knee - Ext: R = 4/5, L = 55. - Flex: R = 2/5, L = 4/5. Ankle (seated position) - Able to heel and toe walk.  - Eversion: R = 3/5; L = 4+/5  REPEATED MOTIONS TESTING: - Repeated lumbar extension in standing during = ERP in back; after = improved ROM. - static lying prone, peripheralizing, no worse but discontinued due to peripherlizing symptoms.   SPECIAL TESTS: Straight leg raise (SLR): R = postive, L = negative. Slump: R = positive, L = negative Sidelying Slump: R = positive (not sensitive to sensitizing maneuver) FABER: R = positive for back pain, confounded for hip by femoral nerve pain, L = negative  ACCESSORY MOTION:  - Tender to CPA along lumbar and thoracic spine, worst at base of lumbar spine.   PALPATION: - TTP over anterior right groin most notably.  FUNCTIONAL MOBILITY: -  Bed mobility: supine <> sit and rolling I with some discomfort.  - Transfers: sit <> stand I with some discomfort. - Gait: I for community and household distances, antalgic gait at times favoring right. - Stairs: not observed -  Objective measurements completed on examination: See above findings.   TREATMENT:  Therapeutic exercise: to centralize symptoms and improve ROM and strength required for successful completion of functional activities.  -  Education on diagnosis, prognosis, POC, anatomy and physiology of current condition.  - Education on HEP including handout  - Trial of seated posture correction with lumbar roll.  - Repeated lumbar extension in standing during = ERP in back; after = improved ROM.  HOME EXERCISE PROGRAM Access Code: [email or web address]  URL: [email or web address]  Date: 06/28/2018  Prepared by: Rosita Kea   Exercises  Standing Lumbar Extension - 10-15 reps - 1 sets - 1 second hold - 4x daily  Standing Lumbar Extension with Counter - 10-15 reps - 1 sets - 1 second hold - 4x daily  Seated Correct Posture    Patient response to treatment:  Pt tolerated treatment well. Pt was able to complete all exercises with minimal to no lasting increase in pain or discomfort. Pt required cuing for proper technique and to facilitate improved neuromuscular control, strength, range of motion, and functional ability.   PT Education - 06/28/18 1952    Education Details  - Education on [employer] including handout. - Education on diagnosis, prognosis, POC, anatomy and physiology of current condition.. Cuing for exercise purpose and form.     Person(s) Educated  Patient    Methods  Explanation;Demonstration;Tactile cues;Verbal cues;Handout    Comprehension  Verbalized understanding;Returned demonstration       PT Short Term Goals - 06/28/18 2003      PT SHORT TERM GOAL #1   Title  Be independent with initial home exercise program for self-management of symptoms.    Baseline  initial program provided at IE    Time  3    Period  Weeks    Status  New    Target Date  07/26/18        PT Long Term Goals - 06/28/18 2004      PT LONG TERM GOAL #1   Title  Be independent with a long-term home exercise program for self-management of symptoms.     Baseline  initial HEP provided at IE    Time  8    Period  Weeks    Status  New    Target Date  08/23/18      PT LONG TERM GOAL #2   Title  Demonstrate improved FOTO score  by 10 units to demonstrate improvement in overall condition and self-reported functional ability.     Baseline  FOTO = 40 (06/28/2018)    Time  8    Period  Weeks    Status  New    Target Date  08/23/18      PT LONG TERM GOAL #3   Title  Reduce pain with functional activities to equal or less than 3/10 to allow patient to complete usual activities including ADLs, IADLs, and social engagement with less difficulty.     Baseline  gets up to 10/10    Time  8    Period  Weeks    Status  New    Target Date  08/23/18      PT LONG TERM GOAL #4   Title  Improve lumbar extension AROM by 50% to  allow patient to complete valued activities with less difficulty.     Baseline  25% of normal (06/28/2018)    Time  8    Period  Weeks    Status  New    Target Date  08/23/18      PT LONG TERM GOAL #5   Title  Patient will demonstrate B LE strength 4+/5 to demonstrate functional strength for independent gait, increased standing tolerance, reaching, lifting, carrying and increased ADL ability.    Baseline  see objective exam    Time  8    Period  Weeks    Status  New    Target Date  08/23/18             Plan - 06/28/18 1617    Clinical Impression Statement  Patient is a 63 y.o. female referred to outpatient physical therapy with a medical diagnosis of Adolescent idiopathic scoliosis of lumbar region who presents with signs and symptoms consistent with low back pain with right sided radiculopathy.    History and Personal Factors relevant to plan of care:  Does have a history of lumbar surgery (1993 for ruptured disc, spinal fusion in back and neck last surgery about 10 years ago). History of migraines (now rare).  History of inguinal nerve pain induced by surgery for atopic pregnancy that lead to right groin pain and associated with spinal cord stimulator implant, history of "aflicted feet" (bilateral bunionectomy), bilateral carpal tunnel release.    Clinical Presentation  Evolving    Clinical  Presentation due to:  patient's condition has been gradually worsening and goal is to prepare for surgery    Clinical Decision Making  Moderate    Rehab Potential  Fair    Clinical Impairments Affecting Rehab Potential  (+) motivation, history of self-management and preservation of function with long term condition; (-) complexity of condition, chronic nature of condition, multiple comorbidities    PT Frequency  2x / week    PT Duration  8 weeks    PT Treatment/Interventions  Aquatic Therapy;ADLs/Self Care Home Management;Biofeedback;Cryotherapy;Moist Heat;Electrical Stimulation;Gait training;Stair training;Functional mobility training;Therapeutic activities;Therapeutic exercise;Balance training;Neuromuscular re-education;Patient/family education;Manual techniques;Passive range of motion;Dry needling;Joint Manipulations   joint mobilizations grades I-V   PT Next Visit Plan  Assess response to HEP and modify. Progress exercises as tolerated. If on land consider Schroth based exercises. Aquatic consider assigning aquatic HEP as pt likes to go to pool on her own.     PT Home Exercise Plan  Medbridge Access Code: [email or web address]     Consulted and Agree with Plan of Care  Patient      Patient will benefit from skilled therapeutic intervention in order to improve the following deficits and impairments:  Abnormal gait, Decreased balance, Decreased endurance, Decreased mobility, Difficulty walking, Hypomobility, Increased muscle spasms, Impaired sensation, Decreased knowledge of precautions, Decreased range of motion, Decreased activity tolerance, Impaired perceived functional ability, Decreased strength, Impaired flexibility, Postural dysfunction, Pain  Visit Diagnosis: Chronic bilateral low back pain with right-sided sciatica  Paresthesia of skin  Muscle weakness (generalized)  Abnormal posture  Pain in right hip     Problem List Patient Active Problem List   Diagnosis Date Noted  . Gallstones  05/13/2018  . Right upper quadrant abdominal pain 04/28/2018  . Disorder of bursae of shoulder region 03/04/2018  . Bicipital tenosynovitis 03/04/2018  . Cervical radiculitis 03/04/2018  . Depression 03/04/2018  . Shoulder pain 03/04/2018  . Sprain of ankle 03/04/2018  . External hemorrhoids without complication 24/58/0998  .  Dyspareunia in female 12/10/2017  . Vaginal pain 12/31/2016  . Rectocele 04/11/2016  . Cystocele, midline 04/11/2016  . Status post abdominal hysterectomy 04/10/2016  . Menopause 04/10/2016  . Essential hypertension 05/29/2015  . 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 Chronic pain of lower extremity (Right side) 05/09/2015  . Chronic lumbar radicular pain (Right side) 05/09/2015  . Chronic right groin pain 05/09/2015  . Complication of implanted electronic neurostimulator of spinal cord ([place]) (battery depletion) 05/09/2015  . 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 HCV antibody positive 05/08/2015  . H/O adenomatous polyp of colon 05/08/2015  . Headache, migraine 05/08/2015  . Restless legs syndrome 05/08/2015  . Rotator cuff syndrome of shoulder and allied disorders 05/08/2015  . Hand tendinitis 05/08/2015  . Anxiety 12/12/2014  . Chronic pain associated with significant psychosocial dysfunction 11/22/2014  . 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 Atypical squamous cells of undetermined significance on cytologic smear of vagina (ASC-US) 11/22/2014  . Snapping thumb syndrome 11/22/2014  . Systemic lupus erythematosus (SLE) inhibitor ([place]) 11/22/2014  . Chronic pain syndrome 11/22/2014  . Disorder of skin and subcutaneous tissue 11/22/2014  . Encounter for routine gynecological examination 11/22/2014  . History of frequent ear infections in childhood 11/22/2014  . Urinary tract infection 11/22/2014  . Menopausal and perimenopausal disorder 12/03/2009  . Hernia, inguinal,  unilateral 05/01/2009  . Spasm of muscle 01/29/2009  . Dyslipidemia 02/25/2007  . Mixed hyperlipidemia 02/25/2007  . Degeneration of intervertebral disc of lumbosacral region 08/19/2006  . Degeneration of lumbar or lumbosacral intervertebral disc 08/19/2006  . Major depressive disorder with single episode 04/16/2006  . Fatty liver 06/23/2005    Nancy Nordmann, PT, DPT 06/28/2018, 8:08 PM  [facility] [address] [place], [address] Phone: [phone]   Fax:  [phone]  Name: Courtney Ross MRN: [id number] Date of Birth: 01-01-1956

## 2018-06-30 ENCOUNTER — Ambulatory Visit (INDEPENDENT_AMBULATORY_CARE_PROVIDER_SITE_OTHER): Payer: Medicare Other

## 2018-06-30 ENCOUNTER — Other Ambulatory Visit: Payer: Self-pay | Admitting: Family Medicine

## 2018-06-30 VITALS — BP 132/72 | HR 80 | Temp 98.3°F | Ht 68.0 in | Wt 167.6 lb

## 2018-06-30 DIAGNOSIS — Z0000 Encounter for general adult medical examination without abnormal findings: Secondary | ICD-10-CM

## 2018-06-30 DIAGNOSIS — Z1211 Encounter for screening for malignant neoplasm of colon: Secondary | ICD-10-CM

## 2018-06-30 DIAGNOSIS — Z86010 Personal history of colonic polyps: Secondary | ICD-10-CM

## 2018-06-30 NOTE — Progress Notes (Signed)
Subjective:   Courtney Ross is a 63 y.o. female who presents for Medicare Annual (Subsequent) preventive examination.  Review of Systems:  N/A  Cardiac Risk Factors include: advanced age (>81men, >82 women)     Objective:     Vitals: BP 132/72 (BP Location: Right Arm)   Pulse 80   Temp 98.3 F (36.8 C) (Oral)   Ht 5\' 8"  (1.727 m)   Wt 167 lb 9.6 oz (76 kg)   BMI 25.48 kg/m   Body mass index is 25.48 kg/m.  Advanced Directives 06/30/2018 06/28/2018 04/30/2018 02/18/2018 06/09/2017 08/25/2016 08/20/2016  Does Patient Have a Medical Advance Directive? Yes Yes Yes No No No No  Type of Paramedic of [place];Living will Healthcare Power of [place];Living will - - - -  Does patient want to make changes to medical advance directive? - No - Patient declined No - Patient declined - - - -  Copy of [place] in Chart? No - copy requested No - copy requested Yes - validated most recent copy scanned in chart (See row information) - - - -  Would patient like information on creating a medical advance directive? - - - No - Patient declined Yes (Inpatient - patient defers creating a medical advance directive at this time) No - Patient declined -    Tobacco Social History   Tobacco Use  Smoking Status Never Smoker  Smokeless Tobacco Never Used     Counseling given: Not Answered   Clinical Intake:  Pre-visit preparation completed: Yes  Pain : 0-10 Pain Score: 3  Pain Type: Chronic pain Pain Location: Back Pain Orientation: Lower Pain Descriptors / Indicators: Aching, Radiating, Tingling Pain Frequency: Constant     Nutritional Status: BMI 25 -29 Overweight Nutritional Risks: None Diabetes: No  How often do you need to have someone help you when you read instructions, pamphlets, or other written materials from your doctor or pharmacy?: 1 - Never  Interpreter Needed?: No  Information entered by ::  [facility], LPN  Past Medical History:  Diagnosis Date  . Anxiety   . Back pain   . Colon polyp   . Constipation   . Degenerative disc disease, lumbar   . Depression   . Family history of adverse reaction to anesthesia    daughter nausea  . Fatty liver   . GERD (gastroesophageal reflux disease)   . H/O spinal fusion   . Headache    migraines  . Headache, migraine 05/08/2015  . High cholesterol   . Hypertension   . Lower back pain   . Polycystic ovarian disease   . PONV (postoperative nausea and vomiting)   . Restless leg syndrome   . Spasm 01/29/2009   Past Surgical History:  Procedure Laterality Date  . [place]  . APPENDECTOMY  1988  . ARTHRODESIS METATARSALPHALANGEAL JOINT (MTPJ) Left 12/14/2014   Procedure: ARTHRODESIS METATARSALPHALANGEAL JOINT (MTPJ) 1ST;  Surgeon: Albertine Patricia, DPM;  Location: [facility];  Service: Podiatry;  Laterality: Left;  LMA  . BACK SURGERY  [phone]   x3  . BLADDER SUSPENSION  2006  . BREAST BIOPSY Left 1990's   core [place]  . BREAST SURGERY  2005   lumpectomy  . CARPAL TUNNEL RELEASE Bilateral 02/26/07,05/21/07  . CHOLECYSTECTOMY N/A 05/03/2018   Procedure: LAPAROSCOPIC CHOLECYSTECTOMY WITH INTRAOPERATIVE CHOLANGIOGRAM;  Surgeon: Robert Bellow, MD;  Location: [facility];  Service: General;  Laterality:  N/A;  . COLONOSCOPY    . COLPORRHAPHY    . DILATION AND CURETTAGE OF UTERUS    . [place]  . FOOT SURGERY    . HAMMER TOE SURGERY Left 03/22/2015   Procedure: HAMMER TOE CORRECTION L 2ND AND 3RD ;  Surgeon: Albertine Patricia, DPM;  Location: [facility];  Service: Podiatry;  Laterality: Left;  WITH LOCAL  . HERNIA REPAIR Right 1988   inguinal  . NECK SURGERY  2006   fusion  . RECTOCELE REPAIR N/A 08/25/2016   Procedure: POSTERIOR REPAIR (RECTOCELE);  Surgeon: Brayton Mars, MD;  Location: [facility];  Service: Gynecology;  Laterality: N/A;  . SPINAL CORD  STIMULATOR IMPLANT    . TONSILLECTOMY  1985   Family History  Problem Relation Age of Onset  . Diabetes Father   . Hypertension Father   . CAD Father   . Diabetes Sister        non-Insulin dependent diabetes mellitus  . Heart disease Brother   . Cancer Daughter        cancer of the cervix at age 42-18  . Heart disease Brother   . Breast cancer Paternal Aunt   . Colon cancer Paternal Uncle   . Ovarian cancer Neg Hx    Social History   Socioeconomic History  . Marital status: Legally Separated    Spouse name: Not on file  . Number of children: 4  . Years of education: Not on file  . Highest education level: 9th grade  Occupational History  . Occupation: disabled  Social Needs  . Financial resource strain: Somewhat hard  . Food insecurity:    Worry: Sometimes true    Inability: Sometimes true  . Transportation needs:    Medical: No    Non-medical: No  Tobacco Use  . Smoking status: Never Smoker  . Smokeless tobacco: Never Used  Substance and Sexual Activity  . Alcohol use: No    Alcohol/week: 0.0 standard drinks    Frequency: Never  . Drug use: No  . Sexual activity: Yes    Birth control/protection: Surgical  Lifestyle  . Physical activity:    Days per week: 3 days    Minutes per session: 40 min  . Stress: To some extent  Relationships  . Social connections:    Talks on phone: Patient refused    Gets together: Patient refused    Attends religious service: Patient refused    Active member of club or organization: Patient refused    Attends meetings of clubs or organizations: Patient refused    Relationship status: Patient refused  Other Topics Concern  . Not on file  Social History Narrative   Pt is currently receiving assistance from [employer] and gets food stamps.     Outpatient Encounter Medications as of 06/30/2018  Medication Sig  . AMITIZA 24 MCG capsule TAKE 1 CAPSULE BY MOUTH TWICE DAILY WITHA MEAL  . aspirin EC 81 MG tablet Take 81 mg by mouth at  bedtime.  Marland Kitchen buPROPion (WELLBUTRIN SR) 150 MG 12 hr tablet Take 1 tablet (150 mg total) by mouth 2 (two) times daily.  . Calcium Carbonate (CALCI-CHEW PO) Take 2 tablets by mouth daily.  . Cholecalciferol (VITAMIN D3) 5000 UNITS CAPS Take 5,000 Units by mouth daily.   . Cyanocobalamin (B-12 COMPLIANCE INJECTION IJ) Inject 1 Dose as directed every 30 (thirty) days.  . cyclobenzaprine (FLEXERIL) 10 MG tablet Take 10 mg by mouth 3 (three) times daily  as needed for muscle spasms.  . diazepam (VALIUM) 10 MG tablet TAKE 1 TABLET BY MOUTH EVERY 6 HOURS AS NEEDED FOR ANXIETY  . docusate sodium (COLACE) 100 MG capsule Take 100 mg by mouth daily.  Marland Kitchen estradiol (ESTRACE) 1 MG tablet Take 1 tablet (1 mg total) by mouth daily.  . fluticasone (FLONASE) 50 MCG/ACT nasal spray Place 1 spray into both nostrils daily as needed for allergies or rhinitis.  . hydroquinone 4 % cream Apply 1 application topically daily as needed (dark spots).   . meloxicam (MOBIC) 15 MG tablet Take 15 mg by mouth at bedtime.   . Multiple Vitamin (MULTIVITAMIN WITH MINERALS) TABS tablet Take 1 tablet by mouth daily.  . mupirocin cream (BACTROBAN) 2 % Apply to affected aread twice daily as needed.  Marland Kitchen omeprazole (PRILOSEC) 40 MG capsule Take 1 capsule (40 mg total) by mouth daily.  . ondansetron (ZOFRAN-ODT) 4 MG disintegrating tablet Take 1 tablet (4 mg total) by mouth every 8 (eight) hours as needed for nausea or vomiting.  Marland Kitchen oxyCODONE (ROXICODONE) 15 MG immediate release tablet Take 1-2 tablets (15-30 mg total) by mouth 4 (four) times daily as needed.  . phentermine 15 MG capsule Take 1 capsule (15 mg total) by mouth every morning.  . polyethylene glycol (MIRALAX / GLYCOLAX) packet Take 17 g by mouth daily as needed for mild constipation.   . pregabalin (LYRICA) 75 MG capsule TAKE 1 CAPSULE BY MOUTH 3 TIMES DAILY  . RESTASIS 0.05 % ophthalmic emulsion Apply 1 drop to eye 2 (two) times daily.  Marland Kitchen rOPINIRole (REQUIP) 1 MG tablet TAKE 1  TABLET AT BEDTIME   No facility-administered encounter medications on file as of 06/30/2018.     Activities of Daily Living In your present state of health, do you have any difficulty performing the following activities: 06/30/2018 04/30/2018  Hearing? N N  Vision? Y N  Comment Due to cataracts on both eyes.  -  Difficulty concentrating or making decisions? N N  Walking or climbing stairs? N N  Dressing or bathing? N N  Doing errands, shopping? N N  Preparing Food and eating ? N -  Using the Toilet? N -  In the past six months, have you accidently leaked urine? N -  Do you have problems with loss of bowel control? N -  Managing your Medications? N -  Managing your Finances? N -  Housekeeping or managing your Housekeeping? N -  Some recent data might be hidden    Patient Care Team: Birdie Sons, MD as PCP - General (Family Medicine) Glenna Fellows, MD as Attending Physician (Neurosurgery) Milinda Pointer, MD as Referring Physician (Pain Medicine) Leandrew Koyanagi, MD as Referring Physician (Ophthalmology) Joylene Igo, CNM as Midwife (Obstetrics and Gynecology) Cline Cools, MD (Inactive) as Referring Physician (Neurosurgery) Robert Bellow, MD (General Surgery)    Assessment:   This is a routine wellness examination for Jamia.  Exercise Activities and Dietary recommendations Current Exercise Habits: Home exercise routine, Type of exercise: walking, Time (Minutes): 45, Frequency (Times/Week): 3(twice daily), Weekly Exercise (Minutes/Week): 135, Intensity: Moderate, Exercise limited by: orthopedic condition(s)  Goals    . DIET - REDUCE SUGAR INTAKE     Recommend to cut back on desserts and sugar is diet.     . Exercise 3x per week (30 min per time)     Recommend to start exercising 3 times a week for at least 30 minutes. Pt to start this in 2019.    Marland Kitchen  Increase water intake     Starting 05/30/16, I will continue drinking 5 glasses of water a day.         Fall Risk Fall Risk  06/30/2018 05/13/2018 04/27/2018 06/09/2017 05/30/2016  Falls in the past year? 0 0 0 No No  Number falls in past yr: 0 - - - -  Injury with Fall? 0 - - - -  Follow up - Falls evaluation completed - - -   FALL RISK PREVENTION PERTAINING TO THE HOME:  Any stairs in or around the home WITH handrails? No  Home free of loose throw rugs in walkways, pet beds, electrical cords, etc? Yes  Adequate lighting in your home to reduce risk of falls? Yes   ASSISTIVE DEVICES UTILIZED TO PREVENT FALLS:  Life alert? No  Use of a cane, walker or w/c? No  Grab bars in the bathroom? Yes  Shower chair or bench in shower? No  Elevated toilet seat or a handicapped toilet? No    TIMED UP AND GO:  Was the test performed? No .     Depression Screen PHQ 2/9 Scores 06/30/2018 06/30/2018 06/09/2017 06/09/2017  PHQ - 2 Score 0 0 0 0  PHQ- 9 Score 2 - 1 -     Cognitive Function     6CIT Screen 06/30/2018 05/30/2016  What Year? 0 points 0 points  What month? 0 points 0 points  What time? 0 points 0 points  Count back from 20 0 points 0 points  Months in reverse 0 points 0 points  Repeat phrase 0 points 4 points  Total Score 0 4    Immunization History  Administered Date(s) Administered  . Influenza Inj Mdck Quad Pf 04/10/2018  . Influenza Split 07/20/2007  . Influenza-Unspecified 04/23/2013, 03/20/2017, 04/10/2018  . Pneumococcal Conjugate-13 05/11/2014  . Tdap 09/21/2015, 09/03/2016  . Zoster 01/13/2011    Qualifies for Shingles Vaccine? Yes  Zostavax completed 01/13/11. Due for Shingrix. Education has been provided regarding the importance of this vaccine. Pt has been advised to call insurance company to determine out of pocket expense. Advised may also receive vaccine at local pharmacy or Health Dept. Verbalized acceptance and understanding.  Tdap: Up to date  Flu Vaccine: Up to date    Screening Tests Health Maintenance  Topic Date Due  . COLONOSCOPY   01/12/2018  . PAP SMEAR-Modifier  04/11/2019  . MAMMOGRAM  06/22/2020  . TETANUS/TDAP  09/04/2026  . INFLUENZA VACCINE  Completed  . Hepatitis C Screening  Completed  . HIV Screening  Completed    Cancer Screenings:  Colorectal Screening: Completed 01/12/13. Repeat every 5 years; Referral to GI placed today. Pt aware the office will call re: appt.  Mammogram: Completed 06/03/17.   Lung Cancer Screening: (Low Dose CT Chest recommended if Age 55-80 years, 30 pack-year currently smoking OR have quit w/in 15years.) does not qualify.    Additional Screening:  Hepatitis C Screening: Up to date  Vision Screening: Recommended annual ophthalmology exams for early detection of glaucoma and other disorders of the eye.  Dental Screening: Recommended annual dental exams for proper oral hygiene  Community Resource Referral:  CRR required this visit?  No       Plan:  I have personally reviewed and addressed the Medicare Annual Wellness questionnaire and have noted the following in the patient's chart:  A. Medical and social history B. Use of alcohol, tobacco or illicit drugs  C. Current medications and supplements D. Functional ability and status E.  Nutritional status F.  Physical activity G. Advance directives H. List of other physicians I.  Hospitalizations, surgeries, and ER visits in previous 12 months J.  [place] such as hearing and vision if needed, cognitive and depression L. Referrals and appointments - none  In addition, I have reviewed and discussed with patient certain preventive protocols, quality metrics, and best practice recommendations. A written personalized care plan for preventive services as well as general preventive health recommendations were provided to patient.  See attached scanned questionnaire for additional information.   Signed,  Fabio Neighbors, LPN Nurse Health Advisor   Nurse Recommendations: None.

## 2018-06-30 NOTE — Patient Instructions (Addendum)
Courtney Ross , Thank you for taking time to come for your Medicare Wellness Visit. I appreciate your ongoing commitment to your health goals. Please review the following plan we discussed and let me know if I can assist you in the future.   Screening recommendations/referrals: Colonoscopy: Referral sent today; Pt aware office will contact her to set up apt. Mammogram: Up to date, due 05/2019 Bone Density: Not required until age 64. Recommended yearly ophthalmology/optometry visit for glaucoma screening and checkup Recommended yearly dental visit for hygiene and checkup  Vaccinations: Influenza vaccine: Up to date Pneumococcal vaccine: Not required until age 73. Tdap vaccine: Up to date, due 08/2026 Shingles vaccine: Pt declines today.     Advanced directives: Please bring a copy of your POA (Power of Attorney) and/or Living Will to your next appointment.   Conditions/risks identified: Recommend to cut back on desserts and sugar is diet.   Next appointment: 07/07/18 @ 10:00 AM with Dr Caryn Section.   Preventive Care 40-64 Years, Female Preventive care refers to lifestyle choices and visits with your health care provider that can promote health and wellness. What does preventive care include?  A yearly physical exam. This is also called an annual well check.  Dental exams once or twice a year.  Routine eye exams. Ask your health care provider how often you should have your eyes checked.  Personal lifestyle choices, including:  Daily care of your teeth and gums.  Regular physical activity.  Eating a healthy diet.  Avoiding tobacco and drug use.  Limiting alcohol use.  Practicing safe sex.  Taking low-dose aspirin daily starting at age 66.  Taking vitamin and mineral supplements as recommended by your health care provider. What happens during an annual well check? The services and screenings done by your health care provider during your annual well check will depend on your age,  overall health, lifestyle risk factors, and family history of disease. Counseling  Your health care provider may ask you questions about your:  Alcohol use.  Tobacco use.  Drug use.  Emotional well-being.  Home and relationship well-being.  Sexual activity.  Eating habits.  Work and work Statistician.  Method of birth control.  Menstrual cycle.  Pregnancy history. Screening  You may have the following tests or measurements:  Height, weight, and BMI.  Blood pressure.  Lipid and cholesterol levels. These may be checked every 5 years, or more frequently if you are over 9 years old.  Skin check.  Lung cancer screening. You may have this screening every year starting at age 48 if you have a 30-pack-year history of smoking and currently smoke or have quit within the past 15 years.  Fecal occult blood test (FOBT) of the stool. You may have this test every year starting at age 29.  Flexible sigmoidoscopy or colonoscopy. You may have a sigmoidoscopy every 5 years or a colonoscopy every 10 years starting at age 31.  Hepatitis C blood test.  Hepatitis B blood test.  Sexually transmitted disease (STD) testing.  Diabetes screening. This is done by checking your blood sugar (glucose) after you have not eaten for a while (fasting). You may have this done every 1-3 years.  Mammogram. This may be done every 1-2 years. Talk to your health care provider about when you should start having regular mammograms. This may depend on whether you have a family history of breast cancer.  BRCA-related cancer screening. This may be done if you have a family history of breast, ovarian, tubal,  or peritoneal cancers.  Pelvic exam and Pap test. This may be done every 3 years starting at age 10. Starting at age 30, this may be done every 5 years if you have a Pap test in combination with an HPV test.  Bone density scan. This is done to screen for osteoporosis. You may have this scan if you are at  high risk for osteoporosis. Discuss your test results, treatment options, and if necessary, the need for more tests with your health care provider. Vaccines  Your health care provider may recommend certain vaccines, such as:  Influenza vaccine. This is recommended every year.  Tetanus, diphtheria, and acellular pertussis (Tdap, Td) vaccine. You may need a Td booster every 10 years.  Zoster vaccine. You may need this after age 28.  Pneumococcal 13-valent conjugate (PCV13) vaccine. You may need this if you have certain conditions and were not previously vaccinated.  Pneumococcal polysaccharide (PPSV23) vaccine. You may need one or two doses if you smoke cigarettes or if you have certain conditions. Talk to your health care provider about which screenings and vaccines you need and how often you need them. This information is not intended to replace advice given to you by your health care provider. Make sure you discuss any questions you have with your health care provider. Document Released: 07/06/2015 Document Revised: 02/27/2016 Document Reviewed: 04/10/2015 Elsevier Interactive Patient Education  2017 [place] Prevention in the Home Falls can cause injuries. They can happen to people of all ages. There are many things you can do to make your home safe and to help prevent falls. What can I do on the outside of my home?  Regularly fix the edges of walkways and driveways and fix any cracks.  Remove anything that might make you trip as you walk through a door, such as a raised step or threshold.  Trim any bushes or trees on the path to your home.  Use bright outdoor lighting.  Clear any walking paths of anything that might make someone trip, such as rocks or tools.  Regularly check to see if handrails are loose or broken. Make sure that both sides of any steps have handrails.  Any raised decks and porches should have guardrails on the edges.  Have any leaves, snow, or  ice cleared regularly.  Use sand or salt on walking paths during winter.  Clean up any spills in your garage right away. This includes oil or grease spills. What can I do in the bathroom?  Use night lights.  Install grab bars by the toilet and in the tub and shower. Do not use towel bars as grab bars.  Use non-skid mats or decals in the tub or shower.  If you need to sit down in the shower, use a plastic, non-slip stool.  Keep the floor dry. Clean up any water that spills on the floor as soon as it happens.  Remove soap buildup in the tub or shower regularly.  Attach bath mats securely with double-sided non-slip rug tape.  Do not have throw rugs and other things on the floor that can make you trip. What can I do in the bedroom?  Use night lights.  Make sure that you have a light by your bed that is easy to reach.  Do not use any sheets or blankets that are too big for your bed. They should not hang down onto the floor.  Have a firm chair that has side arms. You  can use this for support while you get dressed.  Do not have throw rugs and other things on the floor that can make you trip. What can I do in the kitchen?  Clean up any spills right away.  Avoid walking on wet floors.  Keep items that you use a lot in easy-to-reach places.  If you need to reach something above you, use a strong step stool that has a grab bar.  Keep electrical cords out of the way.  Do not use floor polish or wax that makes floors slippery. If you must use wax, use non-skid floor wax.  Do not have throw rugs and other things on the floor that can make you trip. What can I do with my stairs?  Do not leave any items on the stairs.  Make sure that there are handrails on both sides of the stairs and use them. Fix handrails that are broken or loose. Make sure that handrails are as long as the stairways.  Check any carpeting to make sure that it is firmly attached to the stairs. Fix any carpet  that is loose or worn.  Avoid having throw rugs at the top or bottom of the stairs. If you do have throw rugs, attach them to the floor with carpet tape.  Make sure that you have a light switch at the top of the stairs and the bottom of the stairs. If you do not have them, ask someone to add them for you. What else can I do to help prevent falls?  Wear shoes that:  Do not have high heels.  Have rubber bottoms.  Are comfortable and fit you well.  Are closed at the toe. Do not wear sandals.  If you use a stepladder:  Make sure that it is fully opened. Do not climb a closed stepladder.  Make sure that both sides of the stepladder are locked into place.  Ask someone to hold it for you, if possible.  Clearly mark and make sure that you can see:  Any grab bars or handrails.  First and last steps.  Where the edge of each step is.  Use tools that help you move around (mobility aids) if they are needed. These include:  Canes.  Walkers.  Scooters.  Crutches.  Turn on the lights when you go into a dark area. Replace any light bulbs as soon as they burn out.  Set up your furniture so you have a clear path. Avoid moving your furniture around.  If any of your floors are uneven, fix them.  If there are any pets around you, be aware of where they are.  Review your medicines with your doctor. Some medicines can make you feel dizzy. This can increase your chance of falling. Ask your doctor what other things that you can do to help prevent falls. This information is not intended to replace advice given to you by your health care provider. Make sure you discuss any questions you have with your health care provider. Document Released: 04/05/2009 Document Revised: 11/15/2015 Document Reviewed: 07/14/2014 Elsevier Interactive Patient Education  2017 [employer].

## 2018-06-30 NOTE — Progress Notes (Signed)
re

## 2018-07-01 ENCOUNTER — Other Ambulatory Visit: Payer: Self-pay

## 2018-07-01 ENCOUNTER — Ambulatory Visit: Payer: Medicare Other

## 2018-07-01 DIAGNOSIS — G8929 Other chronic pain: Secondary | ICD-10-CM

## 2018-07-01 DIAGNOSIS — R293 Abnormal posture: Secondary | ICD-10-CM | POA: Diagnosis not present

## 2018-07-01 DIAGNOSIS — M6281 Muscle weakness (generalized): Secondary | ICD-10-CM

## 2018-07-01 DIAGNOSIS — R202 Paresthesia of skin: Secondary | ICD-10-CM | POA: Diagnosis not present

## 2018-07-01 DIAGNOSIS — M25551 Pain in right hip: Secondary | ICD-10-CM | POA: Diagnosis not present

## 2018-07-01 DIAGNOSIS — M5441 Lumbago with sciatica, right side: Secondary | ICD-10-CM | POA: Diagnosis not present

## 2018-07-01 NOTE — Therapy (Signed)
[place] MAIN [facility] SERVICES [address] [place], [place], [address] Phone: [phone]   Fax:  [phone]  Physical Therapy Treatment  Patient Details  Name: Courtney Ross MRN: [id number] Date of Birth: Jul 03, 1955 Referring Provider (PT):  Juanetta Beets Elayne Snare, MD.    Encounter Date: 07/01/2018  PT End of Session - 07/01/18 1156    Visit Number  2    Number of Visits  16    Date for PT Re-Evaluation  08/23/18    Authorization Type  UHC    Authorization Time Period  Current cert period: 01/21/8298 - 08/23/2018    Authorization - Visit Number  2    Authorization - Number of Visits  10    PT Start Time  1050    PT Stop Time  1145    PT Time Calculation (min)  55 min    Activity Tolerance  Patient tolerated treatment well;Patient limited by pain    Behavior During Therapy  [facility] for tasks assessed/performed       Past Medical History:  Diagnosis Date  . Anxiety   . Back pain   . Colon polyp   . Constipation   . Degenerative disc disease, lumbar   . Depression   . Family history of adverse reaction to anesthesia    daughter nausea  . Fatty liver   . GERD (gastroesophageal reflux disease)   . H/O spinal fusion   . Headache    migraines  . Headache, migraine 05/08/2015  . High cholesterol   . Hypertension   . Lower back pain   . Polycystic ovarian disease   . PONV (postoperative nausea and vomiting)   . Restless leg syndrome   . Spasm 01/29/2009    Past Surgical History:  Procedure Laterality Date  . [place]  . [place]ENDECTOMY  1988  . ARTHRODESIS METATARSALPHALANGEAL JOINT (MTPJ) Left 12/14/2014   Procedure: ARTHRODESIS METATARSALPHALANGEAL JOINT (MTPJ) 1ST;  Surgeon: Albertine Patricia, DPM;  Location: [place];  Service: Podiatry;  Laterality: Left;  LMA  . BACK SURGERY  [phone]   x3  . BLADDER SUSPENSION  2006  . BREAST BIOPSY Left 1990's   core Sankar  . BREAST SURGERY  2005    lumpectomy  . CARPAL TUNNEL RELEASE Bilateral 02/26/07,05/21/07  . CHOLECYSTECTOMY N/A 05/03/2018   Procedure: LAPAROSCOPIC CHOLECYSTECTOMY WITH INTRAOPERATIVE CHOLANGIOGRAM;  Surgeon: Robert Bellow, MD;  Location: [facility];  Service: General;  Laterality: N/A;  . COLONOSCOPY    . COLPORRHAPHY    . DILATION AND CURETTAGE OF UTERUS    . [place]  . FOOT SURGERY    . HAMMER TOE SURGERY Left 03/22/2015   Procedure: HAMMER TOE CORRECTION L 2ND AND 3RD ;  Surgeon: Albertine Patricia, DPM;  Location: [place];  Service: Podiatry;  Laterality: Left;  WITH LOCAL  . HERNIA REPAIR Right 1988   inguinal  . NECK SURGERY  2006   fusion  . RECTOCELE REPAIR N/A 08/25/2016   Procedure: POSTERIOR REPAIR (RECTOCELE);  Surgeon: Brayton Mars, MD;  Location: [facility];  Service: Gynecology;  Laterality: N/A;  . SPINAL CORD STIMULATOR IMPLANT    . TONSILLECTOMY  1985    There were no vitals filed for this visit.  Subjective Assessment - 07/01/18 1153    Subjective  Pt reports moderate discomfort through R LB that travels down RLE through foot especially as pain intensifies with sitting or  standing still.     Pertinent History  Patient is a 63 y.o. female who presents to outpatient physical therapy with a referral for medical diagnosis adolescent idiopathic scoliosis of lumbar region with request to include aquatic therapy in her POC. This patient's chief complaints consist of pain, paresthesia, stiffness, weakness, decreased function leading to the following functional deficits: difficulty with basic ADLs, IADLs, walking, sitting, standing, lifting. Relevant past medical history and comorbidities: Does have a history of lumbar surgery (1993 for ruptured disc, spinal fusion in back and neck last surgery about 10 years ago). History of migraines (now rare).  History of inguinal nerve pain induced by surgery for atopic pregnancy that lead to right groin pain and associated with  spinal cord stimulator implant, history of "aflicted feet" (bilateral bunionectomy), bilateral carpal tunnel release. Denies history of cancer, diabetes, heart problems, lung problems, seizures.       Enters/exits via ramp  Ambulation, warm up with blue dumbbells  4 L fwd  4 L side  Core with LE strength, 20x ea  Hip abd/add  Hip flex/ext  Minisquat  Fwd walk with thumb up arm drag  Core with UE strength, red dumbbells  Wall sit, 20x ea   Triceps press downs   Sh abd/add   Sh flex/ext   Sh horiz abd/add  Modified planks at bench  Fwd 3 x 30 sec  Side, B 3 x 20 sec  Fwd walk with thumb up arm drag  Active stretch, 3x ea  Hamstrings/gastroc and hip flexor/quad stretch  L flank stand stretch  LB center, slight R, Center, L stretch                           PT Education - 07/01/18 1155    Education Details  Properties and benefits of water as it applies to exercise and posture/ambulation. Core stabilization, planks, active stretching    Person(s) Educated  Patient    Methods  Explanation;Demonstration;Tactile cues;Verbal cues    Comprehension  Verbalized understanding;Returned demonstration;Verbal cues required;Tactile cues required       PT Short Term Goals - 06/28/18 2003      PT SHORT TERM GOAL #1   Title  Be independent with initial home exercise program for self-management of symptoms.    Baseline  initial program provided at IE    Time  3    Period  Weeks    Status  New    Target Date  07/26/18        PT Long Term Goals - 06/28/18 2004      PT LONG TERM GOAL #1   Title  Be independent with a long-term home exercise program for self-management of symptoms.     Baseline  initial HEP provided at IE    Time  8    Period  Weeks    Status  New    Target Date  08/23/18      PT LONG TERM GOAL #2   Title  Demonstrate improved FOTO score by 10 units to demonstrate improvement in overall condition and self-reported functional ability.      Baseline  FOTO = 40 (06/28/2018)    Time  8    Period  Weeks    Status  New    Target Date  08/23/18      PT LONG TERM GOAL #3   Title  Reduce pain with functional activities to equal or less than 3/10 to allow  patient to complete usual activities including ADLs, IADLs, and social engagement with less difficulty.     Baseline  gets up to 10/10    Time  8    Period  Weeks    Status  New    Target Date  08/23/18      PT LONG TERM GOAL #4   Title  Improve lumbar extension AROM by 50% to allow patient to complete valued activities with less difficulty.     Baseline  25% of normal (06/28/2018)    Time  8    Period  Weeks    Status  New    Target Date  08/23/18      PT LONG TERM GOAL #5   Title  Patient will demonstrate B LE strength 4+/5 to demonstrate functional strength for independent gait, increased standing tolerance, reaching, lifting, carrying and increased ADL ability.    Baseline  see objective exam    Time  8    Period  Weeks    Status  New    Target Date  08/23/18            Plan - 07/01/18 1157    Clinical Impression Statement  Tolerated session well with focus on core stabilization/strength toward neutral spine as well as greater stretch to L back/flank. Continue this focus to decrease pain symptoms and promote postural balances.     Rehab Potential  Fair    Clinical Impairments Affecting Rehab Potential  (+) motivation, history of self-management and preservation of function with long term condition; (-) complexity of condition, chronic nature of condition, multiple comorbidities    PT Frequency  2x / week    PT Duration  8 weeks    PT Treatment/Interventions  Aquatic Therapy;ADLs/Self Care Home Management;Biofeedback;Cryotherapy;Moist Heat;Electrical Stimulation;Gait training;Stair training;Functional mobility training;Therapeutic activities;Therapeutic exercise;Balance training;Neuromuscular re-education;Patient/family education;Manual techniques;Passive range of  motion;Dry needling;Joint Manipulations   joint mobilizations grades I-V   PT Next Visit Plan  Assess response to HEP and modify. Progress exercises as tolerated. If on land consider Schroth based exercises. Aquatic consider assigning aquatic HEP as pt likes to go to pool on her own.     PT Home Exercise Plan  Medbridge Access Code: [email or web address]     Consulted and Agree with Plan of Care  Patient       Patient will benefit from skilled therapeutic intervention in order to improve the following deficits and impairments:  Abnormal gait, Decreased balance, Decreased endurance, Decreased mobility, Difficulty walking, Hypomobility, Increased muscle spasms, Impaired sensation, Decreased knowledge of precautions, Decreased range of motion, Decreased activity tolerance, Impaired perceived functional ability, Decreased strength, Impaired flexibility, Postural dysfunction, Pain  Visit Diagnosis: Chronic bilateral low back pain with right-sided sciatica  Paresthesia of skin  Muscle weakness (generalized)  Abnormal posture  Pain in right hip     Problem List Patient Active Problem List   Diagnosis Date Noted  . Gallstones 05/13/2018  . Right upper quadrant abdominal pain 04/28/2018  . Disorder of bursae of shoulder region 03/04/2018  . Bicipital tenosynovitis 03/04/2018  . Cervical radiculitis 03/04/2018  . Depression 03/04/2018  . Shoulder pain 03/04/2018  . Sprain of ankle 03/04/2018  . External hemorrhoids without complication 48/18/5631  . Dyspareunia in female 12/10/2017  . Vaginal pain 12/31/2016  . Rectocele 04/11/2016  . Cystocele, midline 04/11/2016  . Status post abdominal hysterectomy 04/10/2016  . Menopause 04/10/2016  . Essential hypertension 05/29/2015  . 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 Chronic pain of lower extremity (  Right side) 05/09/2015  . Chronic lumbar radicular pain (Right side) 05/09/2015  . Chronic right groin pain 05/09/2015  . Complication of  implanted electronic neurostimulator of spinal cord ([place]) (battery depletion) 05/09/2015  . 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 HCV antibody positive 05/08/2015  . H/O adenomatous polyp of colon 05/08/2015  . Headache, migraine 05/08/2015  . Restless legs syndrome 05/08/2015  . Rotator cuff syndrome of shoulder and allied disorders 05/08/2015  . Hand tendinitis 05/08/2015  . Anxiety 12/12/2014  . Chronic pain associated with significant psychosocial dysfunction 11/22/2014  . 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 Atypical squamous cells of undetermined significance on cytologic smear of vagina (ASC-US) 11/22/2014  . Snapping thumb syndrome 11/22/2014  . Systemic lupus erythematosus (SLE) inhibitor ([place]) 11/22/2014  . Chronic pain syndrome 11/22/2014  . Disorder of skin and subcutaneous tissue 11/22/2014  . Encounter for routine gynecological examination 11/22/2014  . History of frequent ear infections in childhood 11/22/2014  . Urinary tract infection 11/22/2014  . Menopausal and perimenopausal disorder 12/03/2009  . Hernia, inguinal, unilateral 05/01/2009  . Spasm of muscle 01/29/2009  . Dyslipidemia 02/25/2007  . Mixed hyperlipidemia 02/25/2007  . Degeneration of intervertebral disc of lumbosacral region 08/19/2006  . Degeneration of lumbar or lumbosacral intervertebral disc 08/19/2006  . Major depressive disorder with single episode 04/16/2006  . Fatty liver 06/23/2005    Larae Grooms 07/01/2018, 12:00 PM  [place] MAIN [facility] SERVICES [address] [place], [place], [address] Phone: [phone]   Fax:  [phone]  Name: Courtney Ross MRN: [id number] Date of Birth: [birth year]

## 2018-07-06 ENCOUNTER — Ambulatory Visit: Payer: Medicare Other

## 2018-07-06 DIAGNOSIS — R202 Paresthesia of skin: Secondary | ICD-10-CM | POA: Diagnosis not present

## 2018-07-06 DIAGNOSIS — G8929 Other chronic pain: Secondary | ICD-10-CM | POA: Diagnosis not present

## 2018-07-06 DIAGNOSIS — R293 Abnormal posture: Secondary | ICD-10-CM | POA: Diagnosis not present

## 2018-07-06 DIAGNOSIS — M25551 Pain in right hip: Secondary | ICD-10-CM | POA: Diagnosis not present

## 2018-07-06 DIAGNOSIS — M5441 Lumbago with sciatica, right side: Secondary | ICD-10-CM | POA: Diagnosis not present

## 2018-07-06 DIAGNOSIS — M6281 Muscle weakness (generalized): Secondary | ICD-10-CM | POA: Diagnosis not present

## 2018-07-06 NOTE — Therapy (Signed)
[place] MAIN [facility] SERVICES [address] [place], [place], [address] Phone: [phone]   Fax:  [phone]  Physical Therapy Treatment  Patient Details  Name: Courtney Ross MRN: [id number] Date of Birth: 05-12-1956 Referring Provider (PT):  Juanetta Beets Elayne Snare, MD.    Encounter Date: 07/06/2018  PT End of Session - 07/06/18 1138    Visit Number  3    Number of Visits  16    Date for PT Re-Evaluation  08/23/18    Authorization Type  UHC    Authorization Time Period  Current cert period: 12/28/4126 - 08/23/2018    Authorization - Visit Number  3    Authorization - Number of Visits  10    PT Start Time  0805    PT Stop Time  0900    PT Time Calculation (min)  55 min    Activity Tolerance  Patient tolerated treatment well;No increased pain    Behavior During Therapy  WFL for tasks assessed/performed       Past Medical History:  Diagnosis Date  . Anxiety   . Back pain   . Colon polyp   . Constipation   . Degenerative disc disease, lumbar   . Depression   . Family history of adverse reaction to anesthesia    daughter nausea  . Fatty liver   . GERD (gastroesophageal reflux disease)   . H/O spinal fusion   . Headache    migraines  . Headache, migraine 05/08/2015  . High cholesterol   . Hypertension   . Lower back pain   . Polycystic ovarian disease   . PONV (postoperative nausea and vomiting)   . Restless leg syndrome   . Spasm 01/29/2009    Past Surgical History:  Procedure Laterality Date  . [place]      [place]  . APPENDECTOMY  1988  . ARTHRODESIS METATARSALPHALANGEAL JOINT (MTPJ) Left 12/14/2014   Procedure: ARTHRODESIS METATARSALPHALANGEAL JOINT (MTPJ) 1ST;  Surgeon: Albertine Patricia, DPM;  Location: [place];  Service: Podiatry;  Laterality: Left;  LMA  . BACK SURGERY  [phone]   x3  . BLADDER SUSPENSION  2006  . BREAST BIOPSY Left 1990's   core Sankar  . BREAST SURGERY  2005   lumpectomy  .  CARPAL TUNNEL RELEASE Bilateral 02/26/07,05/21/07  . CHOLECYSTECTOMY N/A 05/03/2018   Procedure: LAPAROSCOPIC CHOLECYSTECTOMY WITH INTRAOPERATIVE CHOLANGIOGRAM;  Surgeon: Robert Bellow, MD;  Location: [facility];  Service: General;  Laterality: N/A;  . COLONOSCOPY    . COLPORRHAPHY    . DILATION AND CURETTAGE OF UTERUS    . [place]  . FOOT SURGERY    . HAMMER TOE SURGERY Left 03/22/2015   Procedure: HAMMER TOE CORRECTION L 2ND AND 3RD ;  Surgeon: Albertine Patricia, DPM;  [facility] [place];  Service: Podiatry;  Laterality: Left;  WITH LOCAL  . HERNIA REPAIR Right 1988   inguinal  . NECK SURGERY  2006   fusion  . RECTOCELE REPAIR N/A 08/25/2016   Procedure: POSTERIOR REPAIR (RECTOCELE);  Surgeon: Brayton Mars, MD;  Location: [facility];  Service: Gynecology;  Laterality: N/A;  . SPINAL CORD STIMULATOR IMPLANT    . TONSILLECTOMY  1985    There were no vitals filed for this visit.  Subjective Assessment - 07/06/18 1135    Subjective  Pt reports no changes in pain.  No increased pain after last therapy session.  Reports pain is improved with water  walking as she feels the water helps keep her upright unlike wallking on land or when she has to push a grocery cart or stroller.    Pertinent History  Patient is a 63 y.o. female who presents to outpatient physical therapy with a referral for medical diagnosis adolescent idiopathic scoliosis of lumbar region with request to include aquatic therapy in her POC. This patient's chief complaints consist of pain, paresthesia, stiffness, weakness, decreased function leading to the following functional deficits: difficulty with basic ADLs, IADLs, walking, sitting, standing, lifting. Relevant past medical history and comorbidities: Does have a history of lumbar surgery (1993 for ruptured disc, spinal fusion in back and neck last surgery about 10 years ago). History of migraines (now rare).  History of inguinal nerve pain  induced by surgery for atopic pregnancy that lead to right groin pain and associated with spinal cord stimulator implant, history of "aflicted feet" (bilateral bunionectomy), bilateral carpal tunnel release. Denies history of cancer, diabetes, heart problems, lung problems, seizures.     Pain Score  3     Pain Location  Back    Pain Orientation  Lower    Pain Descriptors / Indicators  Aching    Pain Onset  More than a month ago    Pain Frequency  Constant    Aggravating Factors   sitting and standing or pushing a stroller or shopping care    Pain Relieving Factors  walking in water, laying on left side with pillow between her knees.     Aquatic Therapy: Enters and exits via ramp.  Ambulation warm up 4 L fwd 4 L side  Core with LE strength x 20 Hip ab/add Hip flex/ext  Fwd walk with thumb drag 1 lap  Core with BUE strength, red dumbbells Wall sit x 20 each Tricep press down shld ab/add shld flex/ext shld horiz ab/add  Fwd walk with thumb drag 1 lap  Modified planks at bench Fwd 3 x 30 Side B 3 x 30  Fwd walk with thumb drag 2 L  Active Stretch 3 x  Hamstring/gastroc  Hip flex/ext LB center  After session pt walked on her own in the water x 30 minutes "It just feels good to walk in the water."  No charge for independent gait.     PT Education - 07/06/18 1137    Education Details  exercise techniques    Person(s) Educated  Patient    Methods  Explanation;Demonstration;Tactile cues;Verbal cues    Comprehension  Verbalized understanding;Returned demonstration;Need further instruction       PT Short Term Goals - 06/28/18 2003      PT SHORT TERM GOAL #1   Title  Be independent with initial home exercise program for self-management of symptoms.    Baseline  initial program provided at IE    Time  3    Period  Weeks    Status  New    Target Date  07/26/18        PT Long Term Goals - 06/28/18 2004      PT LONG TERM GOAL #1   Title  Be independent with a  long-term home exercise program for self-management of symptoms.     Baseline  initial HEP provided at IE    Time  8    Period  Weeks    Status  New    Target Date  08/23/18      PT LONG TERM GOAL #2   Title  Demonstrate improved FOTO score  by 10 units to demonstrate improvement in overall condition and self-reported functional ability.     Baseline  FOTO = 40 (06/28/2018)    Time  8    Period  Weeks    Status  New    Target Date  08/23/18      PT LONG TERM GOAL #3   Title  Reduce pain with functional activities to equal or less than 3/10 to allow patient to complete usual activities including ADLs, IADLs, and social engagement with less difficulty.     Baseline  gets up to 10/10    Time  8    Period  Weeks    Status  New    Target Date  08/23/18      PT LONG TERM GOAL #4   Title  Improve lumbar extension AROM by 50% to allow patient to complete valued activities with less difficulty.     Baseline  25% of normal (06/28/2018)    Time  8    Period  Weeks    Status  New    Target Date  08/23/18      PT LONG TERM GOAL #5   Title  Patient will demonstrate B LE strength 4+/5 to demonstrate functional strength for independent gait, increased standing tolerance, reaching, lifting, carrying and increased ADL ability.    Baseline  see objective exam    Time  8    Period  Weeks    Status  New    Target Date  08/23/18            Plan - 07/06/18 1139    Clinical Impression Statement  Tolerated session well.  Needed moderate verbal cues for proper techniuques for exercises and stretching.    Clinical Decision Making  Moderate    Rehab Potential  Fair    Clinical Impairments Affecting Rehab Potential  (+) motivation, history of self-management and preservation of function with long term condition; (-) complexity of condition, chronic nature of condition, multiple comorbidities    PT Frequency  2x / week    PT Duration  8 weeks    PT Treatment/Interventions  Aquatic Therapy;ADLs/Self  Care Home Management;Biofeedback;Cryotherapy;Moist Heat;Electrical Stimulation;Gait training;Stair training;Functional mobility training;Therapeutic activities;Therapeutic exercise;Balance training;Neuromuscular re-education;Patient/family education;Manual techniques;Passive range of motion;Dry needling;Joint Manipulations    PT Next Visit Plan  Assess response to HEP and modify. Progress exercises as tolerated. If on land consider Schroth based exercises. Aquatic consider assigning aquatic HEP as pt likes to go to pool on her own.     PT Home Exercise Plan  Medbridge Access Code: [email or web address]     Consulted and Agree with Plan of Care  Patient       Patient will benefit from skilled therapeutic intervention in order to improve the following deficits and impairments:  Abnormal gait, Decreased balance, Decreased endurance, Decreased mobility, Difficulty walking, Hypomobility, Increased muscle spasms, Impaired sensation, Decreased knowledge of precautions, Decreased range of motion, Decreased activity tolerance, Impaired perceived functional ability, Decreased strength, Impaired flexibility, Postural dysfunction, Pain  Visit Diagnosis: Chronic bilateral low back pain with right-sided sciatica  Paresthesia of skin  Muscle weakness (generalized)  Abnormal posture  Pain in right hip     Problem List Patient Active Problem List   Diagnosis Date Noted  . Gallstones 05/13/2018  . Right upper quadrant abdominal pain 04/28/2018  . Disorder of bursae of shoulder region 03/04/2018  . Bicipital tenosynovitis 03/04/2018  . Cervical radiculitis 03/04/2018  . Depression 03/04/2018  . Shoulder pain 03/04/2018  .  Sprain of ankle 03/04/2018  . External hemorrhoids without complication 42/68/3419  . Dyspareunia in female 12/10/2017  . Vaginal pain 12/31/2016  . Rectocele 04/11/2016  . Cystocele, midline 04/11/2016  . Status post abdominal hysterectomy 04/10/2016  . Menopause 04/10/2016  . Essential  hypertension 05/29/2015  . 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 Chronic pain of lower extremity (Right side) 05/09/2015  . Chronic lumbar radicular pain (Right side) 05/09/2015  . Chronic right groin pain 05/09/2015  . Complication of implanted electronic neurostimulator of spinal cord ([place]) (battery depletion) 05/09/2015  . 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 HCV antibody positive 05/08/2015  . H/O adenomatous polyp of colon 05/08/2015  . Headache, migraine 05/08/2015  . Restless legs syndrome 05/08/2015  . Rotator cuff syndrome of shoulder and allied disorders 05/08/2015  . Hand tendinitis 05/08/2015  . Anxiety 12/12/2014  . Chronic pain associated with significant psychosocial dysfunction 11/22/2014  . 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 Atypical squamous cells of undetermined significance on cytologic smear of vagina (ASC-US) 11/22/2014  . Snapping thumb syndrome 11/22/2014  . Systemic lupus erythematosus (SLE) inhibitor ([place]) 11/22/2014  . Chronic pain syndrome 11/22/2014  . Disorder of skin and subcutaneous tissue 11/22/2014  . Encounter for routine gynecological examination 11/22/2014  . History of frequent ear infections in childhood 11/22/2014  . Urinary tract infection 11/22/2014  . Menopausal and perimenopausal disorder 12/03/2009  . Hernia, inguinal, unilateral 05/01/2009  . Spasm of muscle 01/29/2009  . Dyslipidemia 02/25/2007  . Mixed hyperlipidemia 02/25/2007  . Degeneration of intervertebral disc of lumbosacral region 08/19/2006  . Degeneration of lumbar or lumbosacral intervertebral disc 08/19/2006  . Major depressive disorder with single episode 04/16/2006  . Fatty liver 06/23/2005   Chesley Noon, PTA 07/06/18, 11:46 AM  [place] MAIN [facility] SERVICES [address] [place], [place], [address] Phone: [phone]   Fax:   [phone]  Name: Courtney Ross MRN: [id number] Date of Birth: 06-Apr-1956

## 2018-07-07 ENCOUNTER — Encounter: Payer: Self-pay | Admitting: Family Medicine

## 2018-07-07 ENCOUNTER — Ambulatory Visit (INDEPENDENT_AMBULATORY_CARE_PROVIDER_SITE_OTHER): Payer: Medicare Other | Admitting: Family Medicine

## 2018-07-07 VITALS — BP 103/69 | HR 68 | Temp 97.7°F | Resp 16 | Ht 68.0 in | Wt 168.0 lb

## 2018-07-07 DIAGNOSIS — Z0000 Encounter for general adult medical examination without abnormal findings: Secondary | ICD-10-CM | POA: Diagnosis not present

## 2018-07-07 DIAGNOSIS — E785 Hyperlipidemia, unspecified: Secondary | ICD-10-CM

## 2018-07-07 DIAGNOSIS — Z79899 Other long term (current) drug therapy: Secondary | ICD-10-CM

## 2018-07-07 DIAGNOSIS — D6862 Lupus anticoagulant syndrome: Secondary | ICD-10-CM

## 2018-07-07 DIAGNOSIS — F324 Major depressive disorder, single episode, in partial remission: Secondary | ICD-10-CM | POA: Diagnosis not present

## 2018-07-07 DIAGNOSIS — E038 Other specified hypothyroidism: Secondary | ICD-10-CM

## 2018-07-07 DIAGNOSIS — F1190 Opioid use, unspecified, uncomplicated: Secondary | ICD-10-CM

## 2018-07-07 DIAGNOSIS — E065 Other chronic thyroiditis: Secondary | ICD-10-CM

## 2018-07-07 DIAGNOSIS — Z86010 Personal history of colonic polyps: Secondary | ICD-10-CM

## 2018-07-07 DIAGNOSIS — G2581 Restless legs syndrome: Secondary | ICD-10-CM | POA: Diagnosis not present

## 2018-07-07 DIAGNOSIS — I10 Essential (primary) hypertension: Secondary | ICD-10-CM

## 2018-07-07 DIAGNOSIS — Z860101 Personal history of adenomatous and serrated colon polyps: Secondary | ICD-10-CM

## 2018-07-07 MED ORDER — BUPROPION HCL ER (SR) 150 MG PO TB12: 150.0000 mg | ORAL_TABLET | Freq: Three times a day (TID) | ORAL | 3 refills | Status: DC

## 2018-07-07 NOTE — Progress Notes (Signed)
Patient: Courtney Ross, Female    DOB: 1956/04/18, 63 y.o.   MRN: [id number] Visit Date: 07/07/2018  Today's Provider: Lelon Huh, MD   Chief Complaint  Patient presents with  . Annual Exam  . Depression  . Pain   Subjective:     Complete Physical Courtney Ross is a 63 y.o. female. She feels fairly well. She reports exercising daily. She reports she is sleeping poorly.  -----------------------------------------------------------  Follow up for Major Depression:  The patient was last seen for this several months ago. Changes made at last visit include none.  She reports good compliance with treatment. She feels that condition is Improved. She feels the wellbutrin is helping, but would like to go up a bit on the dose.  She is not having side effects.    ------------------------------------------------------------------------------------   Follow up for Chronic Pain:  The patient was last seen for this several months ago.  Changes made at last visit include none.  She reports good compliance with treatment. She feels that condition is Unchanged. She is not having side effects.   ------------------------------------------------------------------------------------   Follow up for Elevated blood pressure:  The patient was last seen for this 1 years ago. Changes made at last visit include none. Patient was counseled to follow a low sodium diet.  She reports good compliance with treatment. She feels that condition is Improved. She is not having side effects.   ------------------------------------------------------------------------------------    Review of Systems  Constitutional: Negative for chills, fatigue and fever.  HENT: Positive for congestion and sinus pressure. Negative for ear pain, rhinorrhea, sneezing and sore throat.   Eyes: Negative.  Negative for pain and redness.  Respiratory: Negative for cough, shortness of breath and wheezing.    Cardiovascular: Negative for chest pain and leg swelling.  Gastrointestinal: Negative for abdominal pain, blood in stool, constipation, diarrhea and nausea.  Endocrine: Negative for polydipsia and polyphagia.  Genitourinary: Negative.  Negative for dysuria, flank pain, hematuria, pelvic pain, vaginal bleeding and vaginal discharge.  Musculoskeletal: Positive for back pain and neck pain. Negative for arthralgias, gait problem and joint swelling.  Skin: Negative for rash.  Neurological: Positive for headaches. Negative for dizziness, tremors, seizures, weakness, light-headedness and numbness.  Hematological: Negative for adenopathy.  Psychiatric/Behavioral: Negative.  Negative for behavioral problems, confusion and dysphoric mood. The patient is not nervous/anxious and is not hyperactive.     Social History   Socioeconomic History  . Marital status: Legally Separated    Spouse name: Not on file  . Number of children: 4  . Years of education: Not on file  . Highest education level: 9th grade  Occupational History  . Occupation: disabled  Social Needs  . Financial resource strain: Somewhat hard  . Food insecurity:    Worry: Sometimes true    Inability: Sometimes true  . Transportation needs:    Medical: No    Non-medical: No  Tobacco Use  . Smoking status: Never Smoker  . Smokeless tobacco: Never Used  Substance and Sexual Activity  . Alcohol use: No    Alcohol/week: 0.0 standard drinks    Frequency: Never  . Drug use: No  . Sexual activity: Yes    Birth control/protection: Surgical  Lifestyle  . Physical activity:    Days per week: 3 days    Minutes per session: 40 min  . Stress: To some extent  Relationships  . Social connections:    Talks on phone: Patient refused  Gets together: Patient refused    Attends religious service: Patient refused    Active member of club or organization: Patient refused    Attends meetings of clubs or organizations: Patient refused     Relationship status: Patient refused  . Intimate partner violence:    Fear of current or ex partner: No    Emotionally abused: No    Physically abused: No    Forced sexual activity: No  Other Topics Concern  . Not on file  Social History Narrative   Pt is currently receiving assistance from [employer] and gets food stamps.     Past Medical History:  Diagnosis Date  . Anxiety   . Back pain   . Colon polyp   . Constipation   . Degenerative disc disease, lumbar   . Depression   . Family history of adverse reaction to anesthesia    daughter nausea  . Fatty liver   . GERD (gastroesophageal reflux disease)   . H/O spinal fusion   . Headache    migraines  . Headache, migraine 05/08/2015  . High cholesterol   . Hypertension   . Lower back pain   . Polycystic ovarian disease   . PONV (postoperative nausea and vomiting)   . Restless leg syndrome   . Spasm 01/29/2009     Patient Active Problem List   Diagnosis Date Noted  . Gallstones 05/13/2018  . Right upper quadrant abdominal pain 04/28/2018  . Disorder of bursae of shoulder region 03/04/2018  . Bicipital tenosynovitis 03/04/2018  . Cervical radiculitis 03/04/2018  . Depression 03/04/2018  . Shoulder pain 03/04/2018  . Sprain of ankle 03/04/2018  . External hemorrhoids without complication [id number]/[id number]  . Dyspareunia in female 12/10/2017  . Vaginal pain 12/31/2016  . Rectocele 04/11/2016  . Cystocele, midline 04/11/2016  . Status post abdominal hysterectomy 04/10/2016  . Menopause 04/10/2016  . Essential hypertension 05/29/2015  . 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 Chronic pain of lower extremity (Right side) 05/09/2015  . Chronic lumbar radicular pain (Right side) 05/09/2015  . Chronic right groin pain 05/09/2015  . Complication of implanted electronic neurostimulator of spinal cord ([place]) (battery depletion) 05/09/2015  . 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  HCV antibody positive 05/08/2015  . H/O adenomatous polyp of colon 05/08/2015  . Headache, migraine 05/08/2015  . Restless legs syndrome 05/08/2015  . Rotator cuff syndrome of shoulder and allied disorders 05/08/2015  . Hand tendinitis 05/08/2015  . Anxiety 12/12/2014  . Chronic pain associated with significant psychosocial dysfunction 11/22/2014  . 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 Atypical squamous cells of undetermined significance on cytologic smear of vagina (ASC-US) 11/22/2014  . Snapping thumb syndrome 11/22/2014  . Systemic lupus erythematosus (SLE) inhibitor ([place]) 11/22/2014  . Chronic pain syndrome 11/22/2014  . Disorder of skin and subcutaneous tissue 11/22/2014  . Encounter for routine gynecological examination 11/22/2014  . History of frequent ear infections in childhood 11/22/2014  . Urinary tract infection 11/22/2014  . Menopausal and perimenopausal disorder 12/03/2009  . Hernia, inguinal, unilateral 05/01/2009  . Spasm of muscle 01/29/2009  . Dyslipidemia 02/25/2007  . Mixed hyperlipidemia 02/25/2007  . Degeneration of intervertebral disc of lumbosacral region 08/19/2006  . Degeneration of lumbar or lumbosacral intervertebral disc 08/19/2006  . Major depressive disorder with single episode 04/16/2006  . Fatty liver 06/23/2005    Past Surgical History:  Procedure Laterality Date  . [place]  . APPENDECTOMY  1988  . ARTHRODESIS METATARSALPHALANGEAL JOINT (MTPJ) Left 12/14/2014   Procedure: ARTHRODESIS METATARSALPHALANGEAL JOINT (MTPJ) 1ST;  Surgeon: Albertine Patricia, DPM;  Location: [facility];  Service: Podiatry;  Laterality: Left;  LMA  . BACK SURGERY  [phone]   x3  . BLADDER SUSPENSION  2006  . BREAST BIOPSY Left 1990's   core [place]  . BREAST SURGERY  2005   lumpectomy  . CARPAL TUNNEL RELEASE Bilateral 02/26/07,05/21/07  . CHOLECYSTECTOMY N/A 05/03/2018   Procedure: LAPAROSCOPIC  CHOLECYSTECTOMY WITH INTRAOPERATIVE CHOLANGIOGRAM;  Surgeon: Robert Bellow, MD;  Location: [facility];  Service: General;  Laterality: N/A;  . COLONOSCOPY    . COLPORRHAPHY    . DILATION AND CURETTAGE OF UTERUS    . [place]  . FOOT SURGERY    . HAMMER TOE SURGERY Left 03/22/2015   Procedure: HAMMER TOE CORRECTION L 2ND AND 3RD ;  Surgeon: Albertine Patricia, DPM;  Location: [facility];  Service: Podiatry;  Laterality: Left;  WITH LOCAL  . HERNIA REPAIR Right 1988   inguinal  . NECK SURGERY  2006   fusion  . RECTOCELE REPAIR N/A 08/25/2016   Procedure: POSTERIOR REPAIR (RECTOCELE);  Surgeon: Brayton Mars, MD;  Location: [facility];  Service: Gynecology;  Laterality: N/A;  . SPINAL CORD STIMULATOR IMPLANT    . TONSILLECTOMY  1985    Her family history includes Breast cancer in her paternal aunt; CAD in her father; Cancer in her daughter; Colon cancer in her paternal uncle; Diabetes in her father and sister; Heart disease in her brother and brother; Hypertension in her father. There is no history of Ovarian cancer.      Current Outpatient Medications:  .  AMITIZA 24 MCG capsule, TAKE 1 CAPSULE BY MOUTH TWICE DAILY WITHA MEAL, Disp: 60 capsule, Rfl: 4 .  aspirin EC 81 MG tablet, Take 81 mg by mouth at bedtime., Disp: , Rfl:  .  buPROPion (WELLBUTRIN SR) 150 MG 12 hr tablet, Take 1 tablet (150 mg total) by mouth 2 (two) times daily., Disp: 60 tablet, Rfl: 11 .  Calcium Carbonate (CALCI-CHEW PO), Take 2 tablets by mouth daily., Disp: , Rfl:  .  Cholecalciferol (VITAMIN D3) 5000 UNITS CAPS, Take 5,000 Units by mouth daily. , Disp: , Rfl:  .  Cyanocobalamin (B-12 COMPLIANCE INJECTION IJ), Inject 1 Dose as directed every 30 (thirty) days., Disp: , Rfl:  .  cyclobenzaprine (FLEXERIL) 10 MG tablet, Take 10 mg by mouth 3 (three) times daily as needed for muscle spasms., Disp: , Rfl:  .  diazepam (VALIUM) 10 MG tablet, TAKE 1 TABLET BY MOUTH EVERY 6 HOURS AS NEEDED  FOR ANXIETY, Disp: 120 tablet, Rfl: 5 .  docusate sodium (COLACE) 100 MG capsule, Take 100 mg by mouth daily., Disp: , Rfl:  .  estradiol (ESTRACE) 1 MG tablet, Take 1 tablet (1 mg total) by mouth daily., Disp: 90 tablet, Rfl: 1 .  fluticasone (FLONASE) 50 MCG/ACT nasal spray, Place 1 spray into both nostrils daily as needed for allergies or rhinitis., Disp: , Rfl:  .  hydroquinone 4 % cream, Apply 1 application topically daily as needed (dark spots). , Disp: , Rfl: 1 .  meloxicam (MOBIC) 15 MG tablet, Take 15 mg by mouth at bedtime. , Disp: , Rfl:  .  Multiple Vitamin (MULTIVITAMIN WITH MINERALS) TABS tablet, Take 1 tablet by mouth daily., Disp: , Rfl:  .  mupirocin cream (BACTROBAN) 2 %, Apply to  affected aread twice daily as needed., Disp: 15 g, Rfl: 2 .  omeprazole (PRILOSEC) 40 MG capsule, Take 1 capsule (40 mg total) by mouth daily., Disp: 30 capsule, Rfl: 1 .  ondansetron (ZOFRAN-ODT) 4 MG disintegrating tablet, Take 1 tablet (4 mg total) by mouth every 8 (eight) hours as needed for nausea or vomiting., Disp: 12 tablet, Rfl: 0 .  oxyCODONE (ROXICODONE) 15 MG immediate release tablet, Take 1-2 tablets (15-30 mg total) by mouth 4 (four) times daily as needed., Disp: 180 tablet, Rfl: 0 .  phentermine 15 MG capsule, Take 1 capsule (15 mg total) by mouth every morning., Disp: 30 capsule, Rfl: 2 .  polyethylene glycol (MIRALAX / GLYCOLAX) packet, Take 17 g by mouth daily as needed for mild constipation. , Disp: , Rfl:  .  pregabalin (LYRICA) 75 MG capsule, TAKE 1 CAPSULE BY MOUTH 3 TIMES DAILY, Disp: 90 capsule, Rfl: 4 .  RESTASIS 0.05 % ophthalmic emulsion, Apply 1 drop to eye 2 (two) times daily., Disp: , Rfl:  .  rOPINIRole (REQUIP) 1 MG tablet, TAKE 1 TABLET AT BEDTIME, Disp: 30 tablet, Rfl: 11  Patient Care Team: Birdie Sons, MD as PCP - General (Family Medicine) Glenna Fellows, MD as Attending Physician (Neurosurgery) Milinda Pointer, MD as Referring Physician (Pain  Medicine) Leandrew Koyanagi, MD as Referring Physician (Ophthalmology) Joylene Igo, CNM as Midwife (Obstetrics and Gynecology) Cline Cools, MD (Inactive) as Referring Physician (Neurosurgery) Robert Bellow, MD (General Surgery)     Objective:   Vitals: BP 103/69 (BP Location: Right Arm, Patient Position: Sitting, Cuff Size: Large)   Pulse 68   Temp 97.7 F (36.5 C) (Oral)   Resp 16   Ht 5\' 8"  (1.727 m)   Wt 168 lb (76.2 kg)   SpO2 97% Comment: room air  BMI 25.54 kg/m   Physical Exam   General Appearance:    Alert, cooperative, no distress, appears stated age  Head:    Normocephalic, without obvious abnormality, atraumatic  Eyes:    PERRL, conjunctiva/corneas clear, EOM's intact, fundi    benign, both eyes  Ears:    Normal TM's and external ear canals, both ears  Nose:   Nares normal, septum midline, mucosa normal, no drainage    or sinus tenderness  Throat:   Lips, mucosa, and tongue normal; teeth and gums normal  Neck:   Supple, symmetrical, trachea midline, no adenopathy;    thyroid:  no enlargement/tenderness/nodules; no carotid   bruit or JVD  Back:     Symmetric, no curvature, ROM normal, no CVA tenderness  Lungs:     Clear to auscultation bilaterally, respirations unlabored  Chest Wall:    No tenderness or deformity   Heart:    Regular rate and rhythm, S1 and S2 normal, no murmur, rub   or gallop  Breast Exam:    deferred  Abdomen:     Soft, non-tender, bowel sounds active all four quadrants,    no masses, no organomegaly  Pelvic:    deferred  Extremities:   Extremities normal, atraumatic, no cyanosis or edema  Pulses:   2+ and symmetric all extremities  Skin:   Skin color, texture, turgor normal, no rashes or lesions  Lymph nodes:   Cervical, supraclavicular, and axillary nodes normal  Neurologic:   CNII-XII intact, normal strength, sensation and reflexes    throughout    Activities of Daily Living In your present state of health,  do you have any difficulty performing the following activities:  07/07/2018 06/30/2018  Hearing? N N  Vision? Y Y  Comment - Due to cataracts on both eyes.   Difficulty concentrating or making decisions? N N  Walking or climbing stairs? Y N  Dressing or bathing? N N  Doing errands, shopping? N N  Preparing Food and eating ? - N  Using the Toilet? - N  In the past six months, have you accidently leaked urine? - N  Do you have problems with loss of bowel control? - N  Managing your Medications? - N  Managing your Finances? - N  Housekeeping or managing your Housekeeping? - N  Some recent data might be hidden    Fall Risk Assessment Fall Risk  07/07/2018 06/30/2018 05/13/2018 04/27/2018 06/09/2017  Falls in the past year? 0 0 0 0 No  Number falls in past yr: 0 0 - - -  Injury with Fall? 0 0 - - -  Follow up Falls evaluation completed - Falls evaluation completed - -     Depression Screen PHQ 2/9 Scores 07/07/2018 06/30/2018 06/30/2018 06/09/2017  PHQ - 2 Score 0 0 0 0  PHQ- 9 Score 4 2 - 1    6CIT Screen 06/30/2018  What Year? 0 points  What month? 0 points  What time? 0 points  Count back from 20 0 points  Months in reverse 0 points  Repeat phrase 0 points  Total Score 0      Assessment & Plan:    Annual Physical Reviewed patient's Family Medical History Reviewed and updated list of patient's medical providers Assessment of cognitive impairment was done Assessed patient's functional ability Established a written schedule for health screening [place] Completed and Reviewed  Exercise Activities and Dietary recommendations Goals    . DIET - REDUCE SUGAR INTAKE     Recommend to cut back on desserts and sugar is diet.     . Exercise 3x per week (30 min per time)     Recommend to start exercising 3 times a week for at least 30 minutes. Pt to start this in 2019.    . Increase water intake     Starting 05/30/16, I will continue drinking 5 glasses of water a  day.        Immunization History  Administered Date(s) Administered  . Influenza Inj Mdck Quad Pf 04/10/2018  . Influenza Split 07/20/2007  . Influenza-Unspecified 04/23/2013, 03/20/2017, 04/10/2018  . Pneumococcal Conjugate-13 05/11/2014  . Tdap 09/21/2015, 09/03/2016  . Zoster 01/13/2011    Health Maintenance  Topic Date Due  . COLONOSCOPY  01/12/2018  . PAP SMEAR-Modifier  04/11/2019  . MAMMOGRAM  06/22/2020  . TETANUS/TDAP  09/04/2026  . INFLUENZA VACCINE  Completed  . Hepatitis C Screening  Completed  . HIV Screening  Completed     Discussed health benefits of physical activity, and encouraged her to engage in regular exercise appropriate for her age and condition.    ------------------------------------------------------------------------------------------------------------  1. Annual physical exam Generally doing well, pap, pelvic and breast exam deferred to Gyn  2. Major depressive disorder with single episode, in partial remission ([place]) Better on bupropion, but not to goal. Will increase to 3 tablet daily.  - buPROPion (WELLBUTRIN SR) 150 MG 12 hr tablet; Take 1 tablet (150 mg total) by mouth 3 (three) times daily.  Dispense: 90 tablet; Refill: 3  3. . Essential hypertension Currently well controlled with diet and weight loss.   4. H/O adenomatous polyp of colon She states she is  scheduled to follow up with Dr. Bary Castilla for colonoscopy.   5. Restless legs syndrome Stable on ropinarole   6. Chronic, continuous use of opioids Stable. She is anticipating following up with new neurosurgeon at [facility] since Dr. Carloyn Manner is retiring.  - Pain Mgt [place] ([facility]), Ur  7. High risk medication use  - Pain Mgt [facility] ([facility]), Ur   9. Hypothyroidism   - TSH  10. Dyslipidemia Diet controlled.  - Comprehensive metabolic panel - Lipid panel   Lelon Huh, MD  [facility]

## 2018-07-07 NOTE — Patient Instructions (Signed)
.   Please bring all of your medications to every appointment so we can make sure that our medication list is the same as yours.   

## 2018-07-08 ENCOUNTER — Ambulatory Visit: Payer: Medicare Other

## 2018-07-08 LAB — PAIN MGT SCRN (14 DRUGS), UR
Amphetamine Scrn, Ur: NEGATIVE ng/mL
BARBITURATE SCREEN URINE: NEGATIVE ng/mL
BENZODIAZEPINE SCREEN, URINE: POSITIVE ng/mL — AB
Buprenorphine, Urine: NEGATIVE ng/mL
CANNABINOIDS UR QL SCN: NEGATIVE ng/mL
Cocaine (Metab) Scrn, Ur: NEGATIVE ng/mL
Creatinine(Crt), U: 165 mg/dL (ref 20.0–300.0)
Fentanyl, Urine: NEGATIVE pg/mL
Meperidine Screen, Urine: NEGATIVE ng/mL
Methadone Screen, Urine: NEGATIVE ng/mL
OXYCODONE+OXYMORPHONE UR QL SCN: POSITIVE ng/mL — AB
Opiate Scrn, Ur: POSITIVE ng/mL — AB
Ph of Urine: 5.6 (ref 4.5–8.9)
Phencyclidine Qn, Ur: NEGATIVE ng/mL
Propoxyphene Scrn, Ur: NEGATIVE ng/mL
Tramadol Screen, Urine: NEGATIVE ng/mL

## 2018-07-08 LAB — SPECIMEN STATUS REPORT

## 2018-07-12 DIAGNOSIS — E038 Other specified hypothyroidism: Secondary | ICD-10-CM | POA: Diagnosis not present

## 2018-07-12 DIAGNOSIS — E785 Hyperlipidemia, unspecified: Secondary | ICD-10-CM | POA: Diagnosis not present

## 2018-07-13 ENCOUNTER — Other Ambulatory Visit: Payer: Self-pay

## 2018-07-13 ENCOUNTER — Ambulatory Visit: Payer: Medicare Other

## 2018-07-13 DIAGNOSIS — R202 Paresthesia of skin: Secondary | ICD-10-CM | POA: Diagnosis not present

## 2018-07-13 DIAGNOSIS — R293 Abnormal posture: Secondary | ICD-10-CM

## 2018-07-13 DIAGNOSIS — M25551 Pain in right hip: Secondary | ICD-10-CM

## 2018-07-13 DIAGNOSIS — G8929 Other chronic pain: Secondary | ICD-10-CM | POA: Diagnosis not present

## 2018-07-13 DIAGNOSIS — M5441 Lumbago with sciatica, right side: Principal | ICD-10-CM

## 2018-07-13 DIAGNOSIS — M6281 Muscle weakness (generalized): Secondary | ICD-10-CM | POA: Diagnosis not present

## 2018-07-13 LAB — COMPREHENSIVE METABOLIC PANEL
ALT: 32 IU/L (ref 0–32)
AST: 18 IU/L (ref 0–40)
Albumin/Globulin Ratio: 1.5 (ref 1.2–2.2)
Albumin: 4.1 g/dL (ref 3.8–4.8)
Alkaline Phosphatase: 116 IU/L (ref 39–117)
BUN/Creatinine Ratio: 17 (ref 12–28)
BUN: 15 mg/dL (ref 8–27)
Bilirubin Total: 0.4 mg/dL (ref 0.0–1.2)
CO2: 20 mmol/L (ref 20–29)
Calcium: 9.4 mg/dL (ref 8.7–10.3)
Chloride: 104 mmol/L (ref 96–106)
Creatinine, Ser: 0.9 mg/dL (ref 0.57–1.00)
GFR calc Af Amer: 79 mL/min/{1.73_m2} (ref >59)
GFR calc non Af Amer: 68 mL/min/{1.73_m2} (ref >59)
Globulin, Total: 2.7 g/dL (ref 1.5–4.5)
Glucose: 83 mg/dL (ref 65–99)
Potassium: 5 mmol/L (ref 3.5–5.2)
Sodium: 143 mmol/L (ref 134–144)
Total Protein: 6.8 g/dL (ref 6.0–8.5)

## 2018-07-13 LAB — LIPID PANEL
Chol/HDL Ratio: 3.8 ratio (ref 0.0–4.4)
Cholesterol, Total: 267 mg/dL — ABNORMAL HIGH (ref 100–199)
HDL: 71 mg/dL (ref >39)
LDL Calculated: 172 mg/dL — ABNORMAL HIGH (ref 0–99)
Triglycerides: 118 mg/dL (ref 0–149)
VLDL Cholesterol Cal: 24 mg/dL (ref 5–40)

## 2018-07-13 LAB — TSH: TSH: 3.5 u[IU]/mL (ref 0.450–4.500)

## 2018-07-13 NOTE — Therapy (Signed)
[place] MAIN [facility] SERVICES [address] [place], [place], [address] Phone: [phone]   Fax:  [phone]  Physical Therapy Treatment  Patient Details  Name: Courtney Ross MRN: [id number] Date of Birth: Apr 13, 1956 Referring Provider (PT):  Juanetta Beets Elayne Snare, MD.    Encounter Date: 07/13/2018  PT End of Session - 07/13/18 0925    Visit Number  4    Number of Visits  16    Date for PT Re-Evaluation  08/23/18    Authorization Type  UHC    Authorization Time Period  Current cert period: 10/23/9765 - 08/23/2018    Authorization - Visit Number  4    Authorization - Number of Visits  10    PT Start Time  0815    PT Stop Time  0915    PT Time Calculation (min)  60 min    Activity Tolerance  Patient tolerated treatment well    Behavior During Therapy  [facility] for tasks assessed/performed       Past Medical History:  Diagnosis Date  . Anxiety   . Back pain   . Colon polyp   . Constipation   . Degenerative disc disease, lumbar   . Depression   . Family history of adverse reaction to anesthesia    daughter nausea  . Fatty liver   . GERD (gastroesophageal reflux disease)   . H/O spinal fusion   . Headache    migraines  . Headache, migraine 05/08/2015  . High cholesterol   . Hypertension   . Lower back pain   . Polycystic ovarian disease   . PONV (postoperative nausea and vomiting)   . Restless leg syndrome   . Spasm 01/29/2009    Past Surgical History:  Procedure Laterality Date  . [place]  . APPENDECTOMY  1988  . ARTHRODESIS METATARSALPHALANGEAL JOINT (MTPJ) Left 12/14/2014   Procedure: ARTHRODESIS METATARSALPHALANGEAL JOINT (MTPJ) 1ST;  Surgeon: Albertine Patricia, DPM;  Location: [place];  Service: Podiatry;  Laterality: Left;  LMA  . BACK SURGERY  [phone]   x3  . BLADDER SUSPENSION  2006  . BREAST BIOPSY Left 1990's   core Sankar  . BREAST SURGERY  2005   lumpectomy  . CARPAL TUNNEL  RELEASE Bilateral 02/26/07,05/21/07  . CHOLECYSTECTOMY N/A 05/03/2018   Procedure: LAPAROSCOPIC CHOLECYSTECTOMY WITH INTRAOPERATIVE CHOLANGIOGRAM;  Surgeon: Robert Bellow, MD;  Location: [facility];  Service: General;  Laterality: N/A;  . COLONOSCOPY    . COLPORRHAPHY    . DILATION AND CURETTAGE OF UTERUS    . [place]  . FOOT SURGERY    . HAMMER TOE SURGERY Left 03/22/2015   Procedure: HAMMER TOE CORRECTION L 2ND AND 3RD ;  Surgeon: Albertine Patricia, DPM;  Location: [place];  Service: Podiatry;  Laterality: Left;  WITH LOCAL  . HERNIA REPAIR Right 1988   inguinal  . NECK SURGERY  2006   fusion  . RECTOCELE REPAIR N/A 08/25/2016   Procedure: POSTERIOR REPAIR (RECTOCELE);  Surgeon: Brayton Mars, MD;  Location: [facility];  Service: Gynecology;  Laterality: N/A;  . SPINAL CORD STIMULATOR IMPLANT    . TONSILLECTOMY  1985    There were no vitals filed for this visit.  Subjective Assessment - 07/13/18 0921    Subjective  Pt reports continued pain in back and RLE at 4/10. Pt states she is trying to incorporate proper sh (scapular) posture with walking/activity.  Pertinent History  Patient is a 63 y.o. female who presents to outpatient physical therapy with a referral for medical diagnosis adolescent idiopathic scoliosis of lumbar region with request to include aquatic therapy in her POC. This patient's chief complaints consist of pain, paresthesia, stiffness, weakness, decreased function leading to the following functional deficits: difficulty with basic ADLs, IADLs, walking, sitting, standing, lifting. Relevant past medical history and comorbidities: Does have a history of lumbar surgery (1993 for ruptured disc, spinal fusion in back and neck last surgery about 10 years ago). History of migraines (now rare).  History of inguinal nerve pain induced by surgery for atopic pregnancy that lead to right groin pain and associated with spinal cord stimulator  implant, history of "aflicted feet" (bilateral bunionectomy), bilateral carpal tunnel release. Denies history of cancer, diabetes, heart problems, lung problems, seizures.        Enters/exits via ramp  Walking warm up, postural cues required initially  Fwd 4 L  Side step 2 L  Side step with minisquat 2 L (cues required)  SLS with ball toss/catch, B 20x ea (cues, Min guard)  Core with UE strength in wall sit  Red dumbbells, 20x ea   Triceps press down with scap depression   Sh abd/add    Sh flex/ext   Sh horizontal abd/add   Modified high planks (bench) (VC/TC required)  Fwd, 3 x 3 sec   Side, B 2 x 20 sec each (increased cues for scap depression)  Stretching, 3x each  L flank at rail, overhead reach  LB center, L with gentle sweeps                        PT Education - 07/13/18 0924    Education Details  continue sh/scapular positioning with ambulation/exercise. Back and L flank stretching. Planks. Core stabilization.     Person(s) Educated  Patient    Methods  Explanation;Demonstration;Tactile cues;Verbal cues    Comprehension  Verbalized understanding;Verbal cues required;Tactile cues required       PT Short Term Goals - 06/28/18 2003      PT SHORT TERM GOAL #1   Title  Be independent with initial home exercise program for self-management of symptoms.    Baseline  initial program provided at IE    Time  3    Period  Weeks    Status  New    Target Date  07/26/18        PT Long Term Goals - 06/28/18 2004      PT LONG TERM GOAL #1   Title  Be independent with a long-term home exercise program for self-management of symptoms.     Baseline  initial HEP provided at IE    Time  8    Period  Weeks    Status  New    Target Date  08/23/18      PT LONG TERM GOAL #2   Title  Demonstrate improved FOTO score by 10 units to demonstrate improvement in overall condition and self-reported functional ability.     Baseline  FOTO = 40 (06/28/2018)     Time  8    Period  Weeks    Status  New    Target Date  08/23/18      PT LONG TERM GOAL #3   Title  Reduce pain with functional activities to equal or less than 3/10 to allow patient to complete usual activities including ADLs, IADLs, and social engagement with  less difficulty.     Baseline  gets up to 10/10    Time  8    Period  Weeks    Status  New    Target Date  08/23/18      PT LONG TERM GOAL #4   Title  Improve lumbar extension AROM by 50% to allow patient to complete valued activities with less difficulty.     Baseline  25% of normal (06/28/2018)    Time  8    Period  Weeks    Status  New    Target Date  08/23/18      PT LONG TERM GOAL #5   Title  Patient will demonstrate B LE strength 4+/5 to demonstrate functional strength for independent gait, increased standing tolerance, reaching, lifting, carrying and increased ADL ability.    Baseline  see objective exam    Time  8    Period  Weeks    Status  New    Target Date  08/23/18            Plan - 07/13/18 6270    Clinical Impression Statement  Pt continues to require verbal and tactile cueing with R sh/scapular stabilization, but able to demonstrate learning and corrective positioning with cues. Progressed length of plank holds by 5 seconds each this session. Improved core stabilization with work in wall sit with UE strength as well as improved scapular stabilization with min cues. Added SLS core/hip stabilization for strength and balance. Also added sidestep with mini squat in warm up walking and LB stretching with sweeps. Continue to progress core and scapular stabilization for improved muscular balance for spine support.     Rehab Potential  Fair    Clinical Impairments Affecting Rehab Potential  (+) motivation, history of self-management and preservation of function with long term condition; (-) complexity of condition, chronic nature of condition, multiple comorbidities    PT Frequency  2x / week    PT Duration  8  weeks    PT Treatment/Interventions  Aquatic Therapy;ADLs/Self Care Home Management;Biofeedback;Cryotherapy;Moist Heat;Electrical Stimulation;Gait training;Stair training;Functional mobility training;Therapeutic activities;Therapeutic exercise;Balance training;Neuromuscular re-education;Patient/family education;Manual techniques;Passive range of motion;Dry needling;Joint Manipulations    PT Next Visit Plan  Assess response to HEP and modify. Progress exercises as tolerated. If on land consider Schroth based exercises. Aquatic consider assigning aquatic HEP as pt likes to go to pool on her own.     PT Home Exercise Plan  Medbridge Access Code: [email or web address]     Consulted and Agree with Plan of Care  Patient       Patient will benefit from skilled therapeutic intervention in order to improve the following deficits and impairments:  Abnormal gait, Decreased balance, Decreased endurance, Decreased mobility, Difficulty walking, Hypomobility, Increased muscle spasms, Impaired sensation, Decreased knowledge of precautions, Decreased range of motion, Decreased activity tolerance, Impaired perceived functional ability, Decreased strength, Impaired flexibility, Postural dysfunction, Pain  Visit Diagnosis: Chronic bilateral low back pain with right-sided sciatica  Paresthesia of skin  Muscle weakness (generalized)  Abnormal posture  Pain in right hip     Problem List Patient Active Problem List   Diagnosis Date Noted  . Chronic, continuous use of opioids 07/07/2018  . Gallstones 05/13/2018  . Right upper quadrant abdominal pain 04/28/2018  . Disorder of bursae of shoulder region 03/04/2018  . Bicipital tenosynovitis 03/04/2018  . Cervical radiculitis 03/04/2018  . Shoulder pain 03/04/2018  . External hemorrhoids without complication 82/99/3716  . Dyspareunia in female 12/10/2017  . Rectocele  04/11/2016  . Cystocele, midline 04/11/2016  . Status post abdominal hysterectomy 04/10/2016  .  Menopause 04/10/2016  . Essential hypertension 05/29/2015  . 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 Chronic pain of lower extremity (Right side) 05/09/2015  . Chronic lumbar radicular pain (Right side) 05/09/2015  . Chronic right groin pain 05/09/2015  . Complication of implanted electronic neurostimulator of spinal cord ([place]) (battery depletion) 05/09/2015  . 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 HCV antibody positive 05/08/2015  . H/O adenomatous polyp of colon 05/08/2015  . Headache, migraine 05/08/2015  . Restless legs syndrome 05/08/2015  . Rotator cuff syndrome of shoulder and allied disorders 05/08/2015  . Hand tendinitis 05/08/2015  . Anxiety 12/12/2014  . Chronic pain associated with significant psychosocial dysfunction 11/22/2014  . 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 Atypical squamous cells of undetermined significance on cytologic smear of vagina (ASC-US) 11/22/2014  . Snapping thumb syndrome 11/22/2014  . Chronic pain syndrome 11/22/2014  . Disorder of skin and subcutaneous tissue 11/22/2014  . Hernia, inguinal, unilateral 05/01/2009  . Dyslipidemia 02/25/2007  . Degeneration of intervertebral disc of lumbosacral region 08/19/2006  . Degeneration of lumbar or lumbosacral intervertebral disc 08/19/2006  . Major depressive disorder with single episode, in partial remission ([place]) 04/16/2006  . Fatty liver 06/23/2005    Larae Grooms 07/13/2018, 9:31 AM  [place] MAIN [facility] SERVICES [address] [place], [place], [address] Phone: [phone]   Fax:  [phone]  Name: DNYA HICKLE MRN: [id number] Date of Birth: 1956-05-24

## 2018-07-14 ENCOUNTER — Telehealth: Payer: Self-pay

## 2018-07-14 DIAGNOSIS — E7800 Pure hypercholesterolemia, unspecified: Secondary | ICD-10-CM

## 2018-07-14 MED ORDER — ROSUVASTATIN CALCIUM 10 MG PO TABS: 10.0000 mg | ORAL_TABLET | Freq: Every day | ORAL | 3 refills | Status: DC

## 2018-07-14 NOTE — Telephone Encounter (Signed)
-----   Message from Birdie Sons, MD sent at 07/13/2018  7:50 PM EST ----- Cholesterol is very high agaoin at 267. Need to start back on cholesterol medication. Recommend rosuvastatin 10mg  daily, #30, rf x 3. Follow up o.v. in 3 months.

## 2018-07-14 NOTE — Telephone Encounter (Signed)
Patient advised as directed below. Sent medication to [facility] pharmacy. Patient scheduled her 3 month follow-up appointment.

## 2018-07-15 ENCOUNTER — Ambulatory Visit: Payer: Medicare Other

## 2018-07-15 ENCOUNTER — Other Ambulatory Visit: Payer: Self-pay

## 2018-07-15 DIAGNOSIS — R202 Paresthesia of skin: Secondary | ICD-10-CM | POA: Diagnosis not present

## 2018-07-15 DIAGNOSIS — M5441 Lumbago with sciatica, right side: Secondary | ICD-10-CM | POA: Diagnosis not present

## 2018-07-15 DIAGNOSIS — G8929 Other chronic pain: Secondary | ICD-10-CM

## 2018-07-15 DIAGNOSIS — M25551 Pain in right hip: Secondary | ICD-10-CM

## 2018-07-15 DIAGNOSIS — M6281 Muscle weakness (generalized): Secondary | ICD-10-CM | POA: Diagnosis not present

## 2018-07-15 DIAGNOSIS — R293 Abnormal posture: Secondary | ICD-10-CM

## 2018-07-15 NOTE — Therapy (Signed)
[place] MAIN [facility] SERVICES [address] [place], [place], [address] Phone: [phone]   Fax:  [phone]  Physical Therapy Treatment  Patient Details  Name: Courtney Ross MRN: [id number] Date of Birth: 06-29-1955 Referring Provider (PT):  Juanetta Beets Elayne Snare, MD.    Encounter Date: 07/15/2018  PT End of Session - 07/15/18 1257    Visit Number  5    Number of Visits  16    Date for PT Re-Evaluation  08/23/18    Authorization Type  UHC    Authorization Time Period  Current cert period: 1/0/2585 - 08/23/2018    Authorization - Visit Number  5    Authorization - Number of Visits  10    PT Start Time  2778    PT Stop Time  1220    PT Time Calculation (min)  45 min    Activity Tolerance  Patient tolerated treatment well    Behavior During Therapy  [facility] for tasks assessed/performed       Past Medical History:  Diagnosis Date  . Anxiety   . Back pain   . Colon polyp   . Constipation   . Degenerative disc disease, lumbar   . Depression   . Family history of adverse reaction to anesthesia    daughter nausea  . Fatty liver   . GERD (gastroesophageal reflux disease)   . H/O spinal fusion   . Headache    migraines  . Headache, migraine 05/08/2015  . High cholesterol   . Hypertension   . Lower back pain   . Polycystic ovarian disease   . PONV (postoperative nausea and vomiting)   . Restless leg syndrome   . Spasm 01/29/2009    Past Surgical History:  Procedure Laterality Date  . [place]BD[place]CT[place]      [place]  . APPENDECTOMY  1988  . ARTHRODESIS METATARSALPHALANGEAL JOINT (MTPJ) Left 12/14/2014   Procedure: ARTHRODESIS METATARSALPHALANGEAL JOINT (MTPJ) 1ST;  Surgeon: Albertine Patricia, DPM;  Location: [place];  Service: Podiatry;  Laterality: Left;  LMA  . BACK SURGERY  [phone]   x3  . BLADDER SUSPENSION  2006  . BREAST BIOPSY Left 1990's   core Sankar  . BREAST SURGERY  2005   lumpectomy  . CARPAL TUNNEL  RELEASE Bilateral 02/26/07,05/21/07  . CHOLECYSTECTOMY N/A 05/03/2018   Procedure: LAPAROSCOPIC CHOLECYSTECTOMY WITH INTRAOPERATIVE CHOLANGIOGRAM;  Surgeon: Robert Bellow, MD;  Location: [facility];  Service: General;  Laterality: N/A;  . COLONOSCOPY    . COLPORRHAPHY    . DILATION AND CURETTAGE OF UTERUS    . [place]  . FOOT SURGERY    . HAMMER TOE SURGERY Left 03/22/2015   Procedure: HAMMER TOE CORRECTION L 2ND AND 3RD ;  Surgeon: Albertine Patricia, DPM;  Location: [place];  Service: Podiatry;  Laterality: Left;  WITH LOCAL  . HERNIA REPAIR Right 1988   inguinal  . NECK SURGERY  2006   fusion  . RECTOCELE REPAIR N/A 08/25/2016   Procedure: POSTERIOR REPAIR (RECTOCELE);  Surgeon: Brayton Mars, MD;  Location: [facility];  Service: Gynecology;  Laterality: N/A;  . SPINAL CORD STIMULATOR IMPLANT    . TONSILLECTOMY  1985    There were no vitals filed for this visit.  Subjective Assessment - 07/15/18 1242    Subjective  Pt reports tolerating last session well with only mild "work" feeling in low thoracic (about low traps) in the evening and resolved by  morn. Pt states she went to the gym this morning for "slow walking" on a treadmill. Pt does not know speed, but states she walked 2 miles in approximately 1 and a half hours (45 min mile or 1.33 miles per hour). Denies increased pain     Pertinent History  Patient is a 63 y.o. female who presents to outpatient physical therapy with a referral for medical diagnosis adolescent idiopathic scoliosis of lumbar region with request to include aquatic therapy in her POC. This patient's chief complaints consist of pain, paresthesia, stiffness, weakness, decreased function leading to the following functional deficits: difficulty with basic ADLs, IADLs, walking, sitting, standing, lifting. Relevant past medical history and comorbidities: Does have a history of lumbar surgery (1993 for ruptured disc, spinal fusion in back  and neck last surgery about 10 years ago). History of migraines (now rare).  History of inguinal nerve pain induced by surgery for atopic pregnancy that lead to right groin pain and associated with spinal cord stimulator implant, history of "aflicted feet" (bilateral bunionectomy), bilateral carpal tunnel release. Denies history of cancer, diabetes, heart problems, lung problems, seizures.        Ambulation warm up  4 L fwd  2 L side  2 L side with squat (cues for technique, 4 point sequence for correct form)  Core with LE strength, 2# wts, 25x ea  Hip abd/add  Hip flex/ext  Squats (no rail so improved tech/balance)  Modified high plank  2# ankle wts   B hip ext, 2 x 10 (rest in between B set)   Modified side planks (verbal/tactile cues)  R: 1 x 30 sec; 2 x 20 sec  L : 3 x 20 sec  Active stretching, 4x ea   L flank, standing  LB center, L with manual sweeps whole back paraspinals/superior glutes   Water walking 50% weight bearing, mild increased speed 10 min                      PT Education - 07/15/18 1255    Education Details  Progression with ankle wts for LE/core strength with stand hip exercises and hip ext in mod high plank. L flank stretch at home in R sidelying with folded pillow under R ribcage.     Person(s) Educated  Patient    Methods  Explanation;Demonstration    Comprehension  Verbalized understanding;Returned demonstration       PT Short Term Goals - 06/28/18 2003      PT SHORT TERM GOAL #1   Title  Be independent with initial home exercise program for self-management of symptoms.    Baseline  initial program provided at IE    Time  3    Period  Weeks    Status  New    Target Date  07/26/18        PT Long Term Goals - 06/28/18 2004      PT LONG TERM GOAL #1   Title  Be independent with a long-term home exercise program for self-management of symptoms.     Baseline  initial HEP provided at IE    Time  8    Period  Weeks     Status  New    Target Date  08/23/18      PT LONG TERM GOAL #2   Title  Demonstrate improved FOTO score by 10 units to demonstrate improvement in overall condition and self-reported functional ability.     Baseline  FOTO =  40 (06/28/2018)    Time  8    Period  Weeks    Status  New    Target Date  08/23/18      PT LONG TERM GOAL #3   Title  Reduce pain with functional activities to equal or less than 3/10 to allow patient to complete usual activities including ADLs, IADLs, and social engagement with less difficulty.     Baseline  gets up to 10/10    Time  8    Period  Weeks    Status  New    Target Date  08/23/18      PT LONG TERM GOAL #4   Title  Improve lumbar extension AROM by 50% to allow patient to complete valued activities with less difficulty.     Baseline  25% of normal (06/28/2018)    Time  8    Period  Weeks    Status  New    Target Date  08/23/18      PT LONG TERM GOAL #5   Title  Patient will demonstrate B LE strength 4+/5 to demonstrate functional strength for independent gait, increased standing tolerance, reaching, lifting, carrying and increased ADL ability.    Baseline  see objective exam    Time  8    Period  Weeks    Status  New    Target Date  08/23/18            Plan - 07/15/18 1257    Clinical Impression Statement  Pt tolerated session and progressions well. Demonstrating improved scapular stability with planks and able to perform longer holds; finds correct posture/technique with decreased cues. Addition of 2# wts challenge core with stand exercises, but good performance with verbal cues to brace core. Continue to progress strength for improved mucscular balance throughout back and strengthen core/LEs.     Rehab Potential  Fair    Clinical Impairments Affecting Rehab Potential  (+) motivation, history of self-management and preservation of function with long term condition; (-) complexity of condition, chronic nature of condition, multiple comorbidities     PT Frequency  2x / week    PT Duration  8 weeks    PT Treatment/Interventions  Aquatic Therapy;ADLs/Self Care Home Management;Biofeedback;Cryotherapy;Moist Heat;Electrical Stimulation;Gait training;Stair training;Functional mobility training;Therapeutic activities;Therapeutic exercise;Balance training;Neuromuscular re-education;Patient/family education;Manual techniques;Passive range of motion;Dry needling;Joint Manipulations    PT Next Visit Plan  Assess response to HEP and modify. Progress exercises as tolerated. If on land consider Schroth based exercises. Aquatic consider assigning aquatic HEP as pt likes to go to pool on her own.     PT Home Exercise Plan  Medbridge Access Code: [email or web address]     Consulted and Agree with Plan of Care  Patient       Patient will benefit from skilled therapeutic intervention in order to improve the following deficits and impairments:  Abnormal gait, Decreased balance, Decreased endurance, Decreased mobility, Difficulty walking, Hypomobility, Increased muscle spasms, Impaired sensation, Decreased knowledge of precautions, Decreased range of motion, Decreased activity tolerance, Impaired perceived functional ability, Decreased strength, Impaired flexibility, Postural dysfunction, Pain  Visit Diagnosis: Chronic bilateral low back pain with right-sided sciatica  Paresthesia of skin  Muscle weakness (generalized)  Abnormal posture  Pain in right hip     Problem List Patient Active Problem List   Diagnosis Date Noted  . Chronic, continuous use of opioids 07/07/2018  . Gallstones 05/13/2018  . Right upper quadrant abdominal pain 04/28/2018  . Disorder of bursae of shoulder region 03/04/2018  .  Bicipital tenosynovitis 03/04/2018  . Cervical radiculitis 03/04/2018  . Shoulder pain 03/04/2018  . External hemorrhoids without complication 97/67/3419  . Dyspareunia in female 12/10/2017  . Rectocele 04/11/2016  . Cystocele, midline 04/11/2016  . Status  post abdominal hysterectomy 04/10/2016  . Menopause 04/10/2016  . Essential hypertension 05/29/2015  . 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 Chronic pain of lower extremity (Right side) 05/09/2015  . Chronic lumbar radicular pain (Right side) 05/09/2015  . Chronic right groin pain 05/09/2015  . Complication of implanted electronic neurostimulator of spinal cord ([place]) (battery depletion) 05/09/2015  . 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 HCV antibody positive 05/08/2015  . H/O adenomatous polyp of colon 05/08/2015  . Headache, migraine 05/08/2015  . Restless legs syndrome 05/08/2015  . Rotator cuff syndrome of shoulder and allied disorders 05/08/2015  . Hand tendinitis 05/08/2015  . Anxiety 12/12/2014  . Chronic pain associated with significant psychosocial dysfunction 11/22/2014  . 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 Atypical squamous cells of undetermined significance on cytologic smear of vagina (ASC-US) 11/22/2014  . Snapping thumb syndrome 11/22/2014  . Chronic pain syndrome 11/22/2014  . Disorder of skin and subcutaneous tissue 11/22/2014  . Hernia, inguinal, unilateral 05/01/2009  . Dyslipidemia 02/25/2007  . Degeneration of intervertebral disc of lumbosacral region 08/19/2006  . Degeneration of lumbar or lumbosacral intervertebral disc 08/19/2006  . Major depressive disorder with single episode, in partial remission ([place]) 04/16/2006  . Fatty liver 06/23/2005    Larae Grooms 07/15/2018, 1:01 PM  [place] MAIN [facility] SERVICES [address] [place], [place], [address] Phone: [phone]   Fax:  [phone]  Name: TRELLIS GUIRGUIS MRN: [id number] Date of Birth: 1956/01/19

## 2018-07-19 ENCOUNTER — Ambulatory Visit: Payer: Medicare Other | Admitting: Physical Therapy

## 2018-07-19 ENCOUNTER — Encounter: Payer: Self-pay | Admitting: Physical Therapy

## 2018-07-19 DIAGNOSIS — M25551 Pain in right hip: Secondary | ICD-10-CM | POA: Diagnosis not present

## 2018-07-19 DIAGNOSIS — R293 Abnormal posture: Secondary | ICD-10-CM

## 2018-07-19 DIAGNOSIS — G8929 Other chronic pain: Secondary | ICD-10-CM | POA: Diagnosis not present

## 2018-07-19 DIAGNOSIS — M5441 Lumbago with sciatica, right side: Secondary | ICD-10-CM | POA: Diagnosis not present

## 2018-07-19 DIAGNOSIS — R202 Paresthesia of skin: Secondary | ICD-10-CM

## 2018-07-19 DIAGNOSIS — M6281 Muscle weakness (generalized): Secondary | ICD-10-CM | POA: Diagnosis not present

## 2018-07-19 NOTE — Therapy (Signed)
[facility] [address], [place], [address] Phone: [phone]   Fax:  [phone]  Physical Therapy Treatment  Patient Details  Name: Courtney Ross MRN: [id number] Date of Birth: [birth year] Referring Provider (PT):  Juanetta Beets Elayne Snare, MD.    Encounter Date: 07/19/2018  PT End of Session - 07/20/18 1941    Visit Number  6    Number of Visits  16    Date for PT Re-Evaluation  08/23/18    Authorization Type  [employer] reporting period from 06/28/2018    Authorization Time Period  Current cert period: 11/29/6293 - 08/23/2018    Authorization - Visit Number  6    Authorization - Number of Visits  10    PT Start Time  1120    PT Stop Time  1205    PT Time Calculation (min)  45 min    Activity Tolerance  Patient tolerated treatment well    Behavior During Therapy  [facility] for tasks assessed/performed       Past Medical History:  Diagnosis Date  . Anxiety   . Back pain   . Colon polyp   . Constipation   . Degenerative disc disease, lumbar   . Depression   . Family history of adverse reaction to anesthesia    daughter nausea  . Fatty liver   . GERD (gastroesophageal reflux disease)   . H/O spinal fusion   . Headache    migraines  . Headache, migraine 05/08/2015  . High cholesterol   . Hypertension   . Lower back pain   . Polycystic ovarian disease   . PONV (postoperative nausea and vomiting)   . Restless leg syndrome   . Spasm 01/29/2009    Past Surgical History:  Procedure Laterality Date  . [place]  . APPENDECTOMY  1988  . ARTHRODESIS METATARSALPHALANGEAL JOINT (MTPJ) Left 12/14/2014   Procedure: ARTHRODESIS METATARSALPHALANGEAL JOINT (MTPJ) 1ST;  Surgeon: Albertine Patricia, DPM;  Location: [place];  Service: Podiatry;  Laterality: Left;  LMA  . BACK SURGERY  [phone]   x3  . BLADDER SUSPENSION  2006  . BREAST BIOPSY Left 1990's   core [place]  . BREAST SURGERY   2005   lumpectomy  . CARPAL TUNNEL RELEASE Bilateral 02/26/07,05/21/07  . CHOLECYSTECTOMY N/A 05/03/2018   Procedure: LAPAROSCOPIC CHOLECYSTECTOMY WITH INTRAOPERATIVE CHOLANGIOGRAM;  Surgeon: Robert Bellow, MD;  Location: [facility];  Service: General;  Laterality: N/A;  . COLONOSCOPY    . COLPORRHAPHY    . DILATION AND CURETTAGE OF UTERUS    . [place]  . FOOT SURGERY    . HAMMER TOE SURGERY Left 03/22/2015   Procedure: HAMMER TOE CORRECTION L 2ND AND 3RD ;  Surgeon: Albertine Patricia, DPM;  Location: [place];  Service: Podiatry;  Laterality: Left;  WITH LOCAL  . HERNIA REPAIR Right 1988   inguinal  . NECK SURGERY  2006   fusion  . RECTOCELE REPAIR N/A 08/25/2016   Procedure: POSTERIOR REPAIR (RECTOCELE);  Surgeon: Brayton Mars, MD;  Location: [facility];  Service: Gynecology;  Laterality: N/A;  . SPINAL CORD STIMULATOR IMPLANT    . TONSILLECTOMY  1985    There were no vitals filed for this visit.   OBJECTIVE: OBSERVATION/INSPECTION: Patient presents with rounded shoulders, flatten of lumbar lordosis, hyperkyphosis of upper thoracic spine with forward head and rounded shoulders. Spinal curves consistent with AIS  noted with right thoracic convexity and sided rib hump, left lumbar convexity. Right iliac crest slightly higher than left.  NEUROLOGICAL: Dermatomes: diminished to light touch L3-S1 on right compared to left except for L4. S2 diminished to light touch bilaterally.  Myotomes: right S2 diminished, eversion ratcheting Reflexes:   Quadriceps reflex (L4): R = 1+, L = 2+.  Achilles reflex (S1): R = absent, L = 2+. Upper Motor Neuron Screen: Babinski, Hoffman's, and Clonus (ankle) negative bilaterally.   SPINE MOTION Lumbar AROM:  *Indicates pain  Flexion: = low mid shins, reproduced leg pain/tingling.  Extension: = 25% ERP.  Rotation: R = WFL, L = WFL, right pain.  Side Flexion: R = 50 painful on R, L = 40 painful  R.  PERIPHERAL JOINT MOTION (AROM/PROM in degrees):  *Indicates pain Hip          Left hip WFL but provokes left lumbar pain with end range overpressure.    Right hip WFL except painful in most motions, limited extension, limited IR. Knee  Flexion: B WFL.   Extension: B WFL Ankle: grossly WFL   STRENGTH:  *Indicates pain Hip         Flexion: R = 3/5* in back, L = 4/5.  Extension: R = 4/5 (motion restricted), L = 4/5 (painful to lift).  Abduction: R = 3+/5, L = 4+/5. Knee  Ext: R = 4-/5, L = 55.  Flex: R = 4/5, L = 4+/5.  Ankle (seated position)  Able to heel and toe walk.   Eversion: R = 3/5; L = 4+/5  REPEATED MOTIONS TESTING: - Repeated lumbar extension in standing during = ERP in back; after = improved ROM.  ACCESSORY MOTION:   Tender to CPA along lumbar and thoracic spine, worst at base of lumbar spine.   PALPATION:  TTP over anterior right groin most notably.  FUNCTIONAL MOBILITY:  Bed mobility: supine <> sit and rolling I with some discomfort.   Transfers: sit <> stand I with some discomfort.  Gait: I for community and household distances, antalgic gait at times favoring right.  Stairs: not observed   Objective measurements completed on examination: See above findings.   TREATMENT:  Therapeutic exercise: to centralize symptoms and improve ROM and strength required for successful completion of functional activities.  - Treadmill level at self selected speed with no grade with SBA for safety. For improved lower extremity mobility, muscular endurance, and weightbearing activity tolerance; and to induce the analgesic effect of aerobic exercise, stimulate improved joint nutrition, and prepare body structures and systems for following interventions. x 5 minutes during subjective exam.  - examination to assess progress (see above) - Education on diagnosis, prognosis, POC, anatomy and physiology of current condition.  - auto-elongation seated  on theraball bracing shoulder girdle into sticks pressing into floor with continual cuing and feedback from clinician to find neutral lumbar curve, then elongate spine into good posture, and use breath and scapulothoracic musculature to improve posture and strengthen in lengthened position. Several minutes to learn and discuss technique, followed by more time practicing maintaining tension at all the proper places while breathing. Inspired by Schroth exercises for scoliosis.   Manual therapy: to reduce pain and tissue tension, improve range of motion, neuromodulation, in order to promote improved ability to complete functional activities. - pt prone: STM to posterior thoracic and lumbar paraspinals and surrounding musculature to decrease tension and improve pain. Pt reported mild relief following.    HOME EXERCISE PROGRAM Access Code: [email or web address]  URL: [email or web address]  Date: 06/28/2018  Prepared by: Rosita Kea   Exercises   Standing Lumbar Extension - 10-15 reps - 1 sets - 1 second hold - 4x daily   Standing Lumbar Extension with Counter - 10-15 reps - 1 sets - 1 second hold - 4x daily   Seated Correct Posture   Patient response to treatment:  Pt tolerated treatment well. She was noted for tension in the right thoracic and left lumbar region musculature but had minimal response to STM to those regions. She was able to start demonstrating improved postural awareness with autoelongation exercise but required constant cuing and did develop some tolerable  discomfort in the right lumbar region during. Pt required cuing for proper technique and to facilitate improved neuromuscular control, strength, range of motion, and functional ability.      PT Education - 07/20/18 1941    Education Details   Exercise purpose/form. Self management techniques. Education on diagnosis, prognosis, POC, anatomy and physiology of current condition    Person(s) Educated  Patient    Methods   Explanation;Demonstration;Tactile cues;Verbal cues    Comprehension  Verbalized understanding;Returned demonstration       PT Short Term Goals - 06/28/18 2003      PT SHORT TERM GOAL #1   Title  Be independent with initial home exercise program for self-management of symptoms.    Baseline  initial program provided at IE    Time  3    Period  Weeks    Status  New    Target Date  07/26/18        PT Long Term Goals - 06/28/18 2004      PT LONG TERM GOAL #1   Title  Be independent with a long-term home exercise program for self-management of symptoms.     Baseline  initial HEP provided at IE    Time  8    Period  Weeks    Status  New    Target Date  08/23/18      PT LONG TERM GOAL #2   Title  Demonstrate improved FOTO score by 10 units to demonstrate improvement in overall condition and self-reported functional ability.     Baseline  FOTO = 40 (06/28/2018)    Time  8    Period  Weeks    Status  New    Target Date  08/23/18      PT LONG TERM GOAL #3   Title  Reduce pain with functional activities to equal or less than 3/10 to allow patient to complete usual activities including ADLs, IADLs, and social engagement with less difficulty.     Baseline  gets up to 10/10    Time  8    Period  Weeks    Status  New    Target Date  08/23/18      PT LONG TERM GOAL #4   Title  Improve lumbar extension AROM by 50% to allow patient to complete valued activities with less difficulty.     Baseline  25% of normal (06/28/2018)    Time  8    Period  Weeks    Status  New    Target Date  08/23/18      PT LONG TERM GOAL #5   Title  Patient will demonstrate B LE strength 4+/5 to demonstrate functional strength for independent gait, increased standing tolerance, reaching, lifting, carrying and increased ADL ability.    Baseline  see objective exam  Time  8    Period  Weeks    Status  New    Target Date  08/23/18            Plan - 07/20/18 1953    Clinical Impression Statement   Patient has attended 6 physical therapy sessions and is overall making progress towards goals at this point. She is reporting some concerning jerking in her legs but reports her physician is aware and has a treatment plan that includes continued physical therapy. Patient is demonstrating improvements in strength and activity tolerance but continues to struggle with prolonged sitting. Patient is a 63 y.o. female referred to outpatient physical therapy with a medical diagnosis of Adolescent idiopathic scoliosis of lumbar region who presents with signs and symptoms consistent with low back pain with right sided radiculopathy. She demonstrates impairments including pain, paresthesia, stiffness, weakness, decreased function leading to the following functional deficits: difficulty with basic ADLs, IADLs, walking, sitting, standing, lifting. Patient would benefit from continued skilled physical therapy interventions to work towards stated goals and maximal possible function.     Rehab Potential  Fair    Clinical Impairments Affecting Rehab Potential  (+) motivation, history of self-management and preservation of function with long term condition; (-) complexity of condition, chronic nature of condition, multiple comorbidities    PT Frequency  2x / week    PT Duration  8 weeks    PT Treatment/Interventions  Aquatic Therapy;ADLs/Self Care Home Management;Biofeedback;Cryotherapy;Moist Heat;Electrical Stimulation;Gait training;Stair training;Functional mobility training;Therapeutic activities;Therapeutic exercise;Balance training;Neuromuscular re-education;Patient/family education;Manual techniques;Passive range of motion;Dry needling;Joint Manipulations    PT Next Visit Plan  Assess response to HEP and modify. Progress exercises as tolerated. If on land consider Schroth based exercises. Aquatic consider assigning aquatic HEP as pt likes to go to pool on her own.     PT Home Exercise Plan  Medbridge Access Code:  [email or web address]     Consulted and Agree with Plan of Care  Patient       Patient will benefit from skilled therapeutic intervention in order to improve the following deficits and impairments:  Abnormal gait, Decreased balance, Decreased endurance, Decreased mobility, Difficulty walking, Hypomobility, Increased muscle spasms, Impaired sensation, Decreased knowledge of precautions, Decreased range of motion, Decreased activity tolerance, Impaired perceived functional ability, Decreased strength, Impaired flexibility, Postural dysfunction, Pain  Visit Diagnosis: Chronic bilateral low back pain with right-sided sciatica  Paresthesia of skin  Muscle weakness (generalized)  Abnormal posture  Pain in right hip     Problem List Patient Active Problem List   Diagnosis Date Noted  . Chronic, continuous use of opioids 07/07/2018  . Gallstones 05/13/2018  . Right upper quadrant abdominal pain 04/28/2018  . Disorder of bursae of shoulder region 03/04/2018  . Bicipital tenosynovitis 03/04/2018  . Cervical radiculitis 03/04/2018  . Shoulder pain 03/04/2018  . External hemorrhoids without complication 76/81/1572  . Dyspareunia in female 12/10/2017  . Rectocele 04/11/2016  . Cystocele, midline 04/11/2016  . Status post abdominal hysterectomy 04/10/2016  . Menopause 04/10/2016  . Essential hypertension 05/29/2015  . 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 Chronic pain of lower extremity (Right side) 05/09/2015  . Chronic lumbar radicular pain (Right side) 05/09/2015  . Chronic right groin pain 05/09/2015  . Complication of implanted electronic neurostimulator of spinal cord ([place]) (battery depletion) 05/09/2015  . 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 HCV antibody positive 05/08/2015  . H/O adenomatous polyp of colon 05/08/2015  . Headache, migraine 05/08/2015  .  Restless legs syndrome 05/08/2015  . Rotator cuff syndrome of shoulder and  allied disorders 05/08/2015  . Hand tendinitis 05/08/2015  . Anxiety 12/12/2014  . Chronic pain associated with significant psychosocial dysfunction 11/22/2014  . 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 Atypical squamous cells of undetermined significance on cytologic smear of vagina (ASC-US) 11/22/2014  . Snapping thumb syndrome 11/22/2014  . Chronic pain syndrome 11/22/2014  . Disorder of skin and subcutaneous tissue 11/22/2014  . Hernia, inguinal, unilateral 05/01/2009  . Dyslipidemia 02/25/2007  . Degeneration of intervertebral disc of lumbosacral region 08/19/2006  . Degeneration of lumbar or lumbosacral intervertebral disc 08/19/2006  . Major depressive disorder with single episode, in partial remission ([place]) 04/16/2006  . Fatty liver 06/23/2005    Nancy Nordmann, PT, DPT 07/20/2018, 7:55 PM  [facility] [address], [place], [address] Phone: [phone]   Fax:  [phone]  Name: Courtney Ross MRN: [id number] Date of Birth: 08-09-1955

## 2018-07-21 ENCOUNTER — Other Ambulatory Visit: Payer: Self-pay

## 2018-07-21 MED ORDER — OXYCODONE HCL 15 MG PO TABS: 15.0000 mg | ORAL_TABLET | Freq: Four times a day (QID) | ORAL | 0 refills | Status: DC | PRN

## 2018-07-21 MED ORDER — POLYETHYLENE GLYCOL 3350 17 G PO PACK: 17.0000 g | PACK | Freq: Two times a day (BID) | ORAL | 1 refills | Status: DC | PRN

## 2018-07-21 NOTE — Telephone Encounter (Signed)
Patient calling requesting refills for the following medication oxyCODONE (ROXICODONE) 15 MG  polyethylene glycol (MIRALAX / GLYCOLAX) packet   Pharmacy:[facility]

## 2018-07-22 ENCOUNTER — Ambulatory Visit: Payer: Medicare Other

## 2018-07-22 ENCOUNTER — Other Ambulatory Visit: Payer: Self-pay

## 2018-07-22 DIAGNOSIS — R293 Abnormal posture: Secondary | ICD-10-CM

## 2018-07-22 DIAGNOSIS — M5441 Lumbago with sciatica, right side: Principal | ICD-10-CM

## 2018-07-22 DIAGNOSIS — G8929 Other chronic pain: Secondary | ICD-10-CM

## 2018-07-22 DIAGNOSIS — M6281 Muscle weakness (generalized): Secondary | ICD-10-CM | POA: Diagnosis not present

## 2018-07-22 DIAGNOSIS — M25551 Pain in right hip: Secondary | ICD-10-CM | POA: Diagnosis not present

## 2018-07-22 DIAGNOSIS — R202 Paresthesia of skin: Secondary | ICD-10-CM

## 2018-07-22 NOTE — Therapy (Signed)
[place] MAIN [facility] SERVICES [address] [place], [place], [address] Phone: [phone]   Fax:  [phone]  Physical Therapy Treatment  Patient Details  Name: Courtney Ross MRN: [id number] Date of Birth: 1955-10-19 Referring Provider (PT):  Juanetta Beets Elayne Snare, MD.    Encounter Date: 07/22/2018  PT End of Session - 07/22/18 1216    Visit Number  7    Number of Visits  16    Date for PT Re-Evaluation  08/23/18    Authorization Type  UHC reporting period from 06/28/2018    Authorization Time Period  Current cert period: 10/21/6999 - 08/23/2018    Authorization - Visit Number  7    Authorization - Number of Visits  10    PT Start Time  1100    PT Stop Time  1150    PT Time Calculation (min)  50 min    Activity Tolerance  Patient tolerated treatment well    Behavior During Therapy  [facility] for tasks assessed/performed       Past Medical History:  Diagnosis Date  . Anxiety   . Back pain   . Colon polyp   . Constipation   . Degenerative disc disease, lumbar   . Depression   . Family history of adverse reaction to anesthesia    daughter nausea  . Fatty liver   . GERD (gastroesophageal reflux disease)   . H/O spinal fusion   . Headache    migraines  . Headache, migraine 05/08/2015  . High cholesterol   . Hypertension   . Lower back pain   . Polycystic ovarian disease   . PONV (postoperative nausea and vomiting)   . Restless leg syndrome   . Spasm 01/29/2009    Past Surgical History:  Procedure Laterality Date  . [place]  . APPENDECTOMY  1988  . ARTHRODESIS METATARSALPHALANGEAL JOINT (MTPJ) Left 12/14/2014   Procedure: ARTHRODESIS METATARSALPHALANGEAL JOINT (MTPJ) 1ST;  Surgeon: Albertine Patricia, DPM;  Location: [place];  Service: Podiatry;  Laterality: Left;  LMA  . BACK SURGERY  [phone]   x3  . BLADDER SUSPENSION  2006  . BREAST BIOPSY Left 1990's   core Sankar  . BREAST SURGERY  2005    lumpectomy  . CARPAL TUNNEL RELEASE Bilateral 02/26/07,05/21/07  . CHOLECYSTECTOMY N/A 05/03/2018   Procedure: LAPAROSCOPIC CHOLECYSTECTOMY WITH INTRAOPERATIVE CHOLANGIOGRAM;  Surgeon: Robert Bellow, MD;  Location: [facility];  Service: General;  Laterality: N/A;  . COLONOSCOPY    . COLPORRHAPHY    . DILATION AND CURETTAGE OF UTERUS    . [place]  . FOOT SURGERY    . HAMMER TOE SURGERY Left 03/22/2015   Procedure: HAMMER TOE CORRECTION L 2ND AND 3RD ;  Surgeon: Albertine Patricia, DPM;  Location: [place];  Service: Podiatry;  Laterality: Left;  WITH LOCAL  . HERNIA REPAIR Right 1988   inguinal  . NECK SURGERY  2006   fusion  . RECTOCELE REPAIR N/A 08/25/2016   Procedure: POSTERIOR REPAIR (RECTOCELE);  Surgeon: Brayton Mars, MD;  Location: [facility];  Service: Gynecology;  Laterality: N/A;  . SPINAL CORD STIMULATOR IMPLANT    . TONSILLECTOMY  1985    There were no vitals filed for this visit.  Subjective Assessment - 07/22/18 1213    Subjective  Pt reports feeling better overall throughout back; current 4/10 pain. Pt notes increaesed pain on R mid back while  relaxing back watching t.v with propped with pillows. Pt describes set up; no arm support.     Pertinent History  Patient is a 63 y.o. female who presents to outpatient physical therapy with a referral for medical diagnosis adolescent idiopathic scoliosis of lumbar region with request to include aquatic therapy in her POC. This patient's chief complaints consist of pain, paresthesia, stiffness, weakness, decreased function leading to the following functional deficits: difficulty with basic ADLs, IADLs, walking, sitting, standing, lifting. Relevant past medical history and comorbidities: Does have a history of lumbar surgery (1993 for ruptured disc, spinal fusion in back and neck last surgery about 10 years ago). History of migraines (now rare).  History of inguinal nerve pain induced by surgery for  atopic pregnancy that lead to right groin pain and associated with spinal cord stimulator implant, history of "aflicted feet" (bilateral bunionectomy), bilateral carpal tunnel release. Denies history of cancer, diabetes, heart problems, lung problems, seizures.       Enters/exits via steps   Warm up ambulation  4 L fwd  2 L side  2 L side with squat (verbal cues, but much improved)  Mod plank with posterior LE strengthening, 2# ankle wts  Hip ext, B 2 x 10  Hip abd, B 2 x 10    Lift and scoot across bench 1 L  Core with LE work at rail, 2# ankle wts, 20x ea  B hip abd/add  B hip flex/ext  Ipsilateral step up with contralateral hip ext, 2# ankle wts (verbal/tactile cues)  B 20x ea  Active stretching, 3x each  LB center, R, center, L with gentle paraspinal sweeps  L flank overhead reach                         PT Education - 07/22/18 1222    Education Details  Lift and scoot activity across bench for scapular strength/stability.        PT Short Term Goals - 06/28/18 2003      PT SHORT TERM GOAL #1   Title  Be independent with initial home exercise program for self-management of symptoms.    Baseline  initial program provided at IE    Time  3    Period  Weeks    Status  New    Target Date  07/26/18        PT Long Term Goals - 06/28/18 2004      PT LONG TERM GOAL #1   Title  Be independent with a long-term home exercise program for self-management of symptoms.     Baseline  initial HEP provided at IE    Time  8    Period  Weeks    Status  New    Target Date  08/23/18      PT LONG TERM GOAL #2   Title  Demonstrate improved FOTO score by 10 units to demonstrate improvement in overall condition and self-reported functional ability.     Baseline  FOTO = 40 (06/28/2018)    Time  8    Period  Weeks    Status  New    Target Date  08/23/18      PT LONG TERM GOAL #3   Title  Reduce pain with functional activities to equal or less than 3/10 to  allow patient to complete usual activities including ADLs, IADLs, and social engagement with less difficulty.     Baseline  gets up to 10/10  Time  8    Period  Weeks    Status  New    Target Date  08/23/18      PT LONG TERM GOAL #4   Title  Improve lumbar extension AROM by 50% to allow patient to complete valued activities with less difficulty.     Baseline  25% of normal (06/28/2018)    Time  8    Period  Weeks    Status  New    Target Date  08/23/18      PT LONG TERM GOAL #5   Title  Patient will demonstrate B LE strength 4+/5 to demonstrate functional strength for independent gait, increased standing tolerance, reaching, lifting, carrying and increased ADL ability.    Baseline  see objective exam    Time  8    Period  Weeks    Status  New    Target Date  08/23/18            Plan - 07/22/18 1217    Clinical Impression Statement  Pt educated in using RUE support in bed when reclined on pillow to counteract R curvature and prevent increased muscle guarding. Overalll, R mid throracic and upper back musulature looking less prominent and L side with greater stability as noted by scapular posturing and control. Pt performed upper body lift and scoot well. Continued progress in modified plank position. RLE weakness apparent with decreased eccentric control and hip ext with step activity. Continue to progress strength in core and scapular stability, LEs and improve flexibility in L upper/mid back. Continued work toward muscle balance.     Rehab Potential  Fair    Clinical Impairments Affecting Rehab Potential  (+) motivation, history of self-management and preservation of function with long term condition; (-) complexity of condition, chronic nature of condition, multiple comorbidities    PT Frequency  2x / week    PT Duration  8 weeks    PT Treatment/Interventions  Aquatic Therapy;ADLs/Self Care Home Management;Biofeedback;Cryotherapy;Moist Heat;Electrical Stimulation;Gait  training;Stair training;Functional mobility training;Therapeutic activities;Therapeutic exercise;Balance training;Neuromuscular re-education;Patient/family education;Manual techniques;Passive range of motion;Dry needling;Joint Manipulations    PT Next Visit Plan  Assess response to HEP and modify. Progress exercises as tolerated. If on land consider Schroth based exercises. Aquatic consider assigning aquatic HEP as pt likes to go to pool on her own.     PT Home Exercise Plan  Medbridge Access Code: [email or web address]     Consulted and Agree with Plan of Care  Patient       Patient will benefit from skilled therapeutic intervention in order to improve the following deficits and impairments:  Abnormal gait, Decreased balance, Decreased endurance, Decreased mobility, Difficulty walking, Hypomobility, Increased muscle spasms, Impaired sensation, Decreased knowledge of precautions, Decreased range of motion, Decreased activity tolerance, Impaired perceived functional ability, Decreased strength, Impaired flexibility, Postural dysfunction, Pain  Visit Diagnosis: Chronic bilateral low back pain with right-sided sciatica  Paresthesia of skin  Muscle weakness (generalized)  Abnormal posture  Pain in right hip     Problem List Patient Active Problem List   Diagnosis Date Noted  . Chronic, continuous use of opioids 07/07/2018  . Gallstones 05/13/2018  . Right upper quadrant abdominal pain 04/28/2018  . Disorder of bursae of shoulder region 03/04/2018  . Bicipital tenosynovitis 03/04/2018  . Cervical radiculitis 03/04/2018  . Shoulder pain 03/04/2018  . External hemorrhoids without complication 17/51/0258  . Dyspareunia in female 12/10/2017  . Rectocele 04/11/2016  . Cystocele, midline 04/11/2016  . Status post abdominal  hysterectomy 04/10/2016  . Menopause 04/10/2016  . Essential hypertension 05/29/2015  . 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 Chronic pain of lower extremity (Right side)  05/09/2015  . Chronic lumbar radicular pain (Right side) 05/09/2015  . Chronic right groin pain 05/09/2015  . Complication of implanted electronic neurostimulator of spinal cord ([place]) (battery depletion) 05/09/2015  . 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 HCV antibody positive 05/08/2015  . H/O adenomatous polyp of colon 05/08/2015  . Headache, migraine 05/08/2015  . Restless legs syndrome 05/08/2015  . Rotator cuff syndrome of shoulder and allied disorders 05/08/2015  . Hand tendinitis 05/08/2015  . Anxiety 12/12/2014  . Chronic pain associated with significant psychosocial dysfunction 11/22/2014  . 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 Atypical squamous cells of undetermined significance on cytologic smear of vagina (ASC-US) 11/22/2014  . Snapping thumb syndrome 11/22/2014  . Chronic pain syndrome 11/22/2014  . Disorder of skin and subcutaneous tissue 11/22/2014  . Hernia, inguinal, unilateral 05/01/2009  . Dyslipidemia 02/25/2007  . Degeneration of intervertebral disc of lumbosacral region 08/19/2006  . Degeneration of lumbar or lumbosacral intervertebral disc 08/19/2006  . Major depressive disorder with single episode, in partial remission ([place]) 04/16/2006  . Fatty liver 06/23/2005    Larae Grooms 07/22/2018, 12:24 PM  [place] MAIN [facility] SERVICES [address] [place], [place], [address] Phone: [phone]   Fax:  [phone]  Name: HARMONEE TOZER MRN: [id number] Date of Birth: Dec 02, 1955

## 2018-07-27 ENCOUNTER — Ambulatory Visit: Payer: Medicare Other | Attending: Neurosurgery

## 2018-07-27 ENCOUNTER — Other Ambulatory Visit: Payer: Self-pay

## 2018-07-27 DIAGNOSIS — R202 Paresthesia of skin: Secondary | ICD-10-CM | POA: Insufficient documentation

## 2018-07-27 DIAGNOSIS — G8929 Other chronic pain: Secondary | ICD-10-CM | POA: Insufficient documentation

## 2018-07-27 DIAGNOSIS — R293 Abnormal posture: Secondary | ICD-10-CM | POA: Insufficient documentation

## 2018-07-27 DIAGNOSIS — M25551 Pain in right hip: Secondary | ICD-10-CM | POA: Diagnosis not present

## 2018-07-27 DIAGNOSIS — M6281 Muscle weakness (generalized): Secondary | ICD-10-CM | POA: Insufficient documentation

## 2018-07-27 DIAGNOSIS — M5441 Lumbago with sciatica, right side: Secondary | ICD-10-CM | POA: Diagnosis not present

## 2018-07-27 NOTE — Therapy (Signed)
[place] MAIN [facility] SERVICES [address] [place], [place], [address] Phone: [phone]   Fax:  [phone]  Physical Therapy Treatment  Patient Details  Name: Courtney Ross MRN: [id number] Date of Birth: 23-May-1956 Referring Provider (PT):  Juanetta Beets Elayne Snare, MD.    Encounter Date: 07/27/2018  PT End of Session - 07/27/18 1715    Visit Number  8    Number of Visits  16    Date for PT Re-Evaluation  08/23/18    Authorization Type  UHC reporting period from 06/28/2018    Authorization Time Period  Current cert period: 0/0/9381 - 08/23/2018    Authorization - Visit Number  8    Authorization - Number of Visits  10    PT Start Time  0855    PT Stop Time  0940    PT Time Calculation (min)  45 min    Activity Tolerance  Patient tolerated treatment well    Behavior During Therapy  [facility] for tasks assessed/performed       Past Medical History:  Diagnosis Date  . Anxiety   . Back pain   . Colon polyp   . Constipation   . Degenerative disc disease, lumbar   . Depression   . Family history of adverse reaction to anesthesia    daughter nausea  . Fatty liver   . GERD (gastroesophageal reflux disease)   . H/O spinal fusion   . Headache    migraines  . Headache, migraine 05/08/2015  . High cholesterol   . Hypertension   . Lower back pain   . Polycystic ovarian disease   . PONV (postoperative nausea and vomiting)   . Restless leg syndrome   . Spasm 01/29/2009    Past Surgical History:  Procedure Laterality Date  . [place]      [place]  . APPENDECTOMY  1988  . ARTHRODESIS METATARSALPHALANGEAL JOINT (MTPJ) Left 12/14/2014   Procedure: ARTHRODESIS METATARSALPHALANGEAL JOINT (MTPJ) 1ST;  Surgeon: Albertine Patricia, DPM;  Location: [place];  Service: Podiatry;  Laterality: Left;  LMA  . BACK SURGERY  [phone]   x3  . BLADDER SUSPENSION  2006  . BREAST BIOPSY Left 1990's   core Sankar  . BREAST SURGERY  2005    lumpectomy  . CARPAL TUNNEL RELEASE Bilateral 02/26/07,05/21/07  . CHOLECYSTECTOMY N/A 05/03/2018   Procedure: LAPAROSCOPIC CHOLECYSTECTOMY WITH INTRAOPERATIVE CHOLANGIOGRAM;  Surgeon: Robert Bellow, MD;  Location: [facility];  Service: General;  Laterality: N/A;  . COLONOSCOPY    . COLPORRHAPHY    . DILATION AND CURETTAGE OF UTERUS    . [place]  . FOOT SURGERY    . HAMMER TOE SURGERY Left 03/22/2015   Procedure: HAMMER TOE CORRECTION L 2ND AND 3RD ;  Surgeon: Albertine Patricia, DPM;  Location: [place];  Service: Podiatry;  Laterality: Left;  WITH LOCAL  . HERNIA REPAIR Right 1988   inguinal  . NECK SURGERY  2006   fusion  . RECTOCELE REPAIR N/A 08/25/2016   Procedure: POSTERIOR REPAIR (RECTOCELE);  Surgeon: Brayton Mars, MD;  Location: [facility];  Service: Gynecology;  Laterality: N/A;  . SPINAL CORD STIMULATOR IMPLANT    . TONSILLECTOMY  1985    There were no vitals filed for this visit.  Subjective Assessment - 07/27/18 1710    Subjective  Pt reports no change in symptoms this date. Continues to work on positioning and stretches to decrease muscle tightness/guarding  in [place] back as educated.     Pertinent History  Patient is a 63 y.o. female who presents to outpatient physical therapy with a referral for medical diagnosis adolescent idiopathic scoliosis of lumbar region with request to include aquatic therapy in her POC. This patient's chief complaints consist of pain, paresthesia, stiffness, weakness, decreased function leading to the following functional deficits: difficulty with basic ADLs, IADLs, walking, sitting, standing, lifting. Relevant past medical history and comorbidities: Does have a history of lumbar surgery (1993 for ruptured disc, spinal fusion in back and neck last surgery about 10 years ago). History of migraines (now rare).  History of inguinal nerve pain induced by surgery for atopic pregnancy that lead to right groin pain and  associated with spinal cord stimulator implant, history of "aflicted feet" (bilateral bunionectomy), bilateral carpal tunnel release. Denies history of cancer, diabetes, heart problems, lung problems, seizures.       Ambulation warm up  4 L fwd  2 L side   2 L side with squat  Core with UE work  Scientist, physiological, 30x ea   Sh abd/add   Sh flex/ext   Sh horiz abd/add  Power walk with Mitts  Fwd 6 L  Mod high plank, 2# ankle wts  Hip ext, B 2 x 10  B hip abd (simultaneous), 2 x 10   Mod high side plank, 2# ankle wts with small starfish  B 2 x 30 seconds  L sh horizontal abd with scapular depression  in neutral squat work ing position; red dumbell, 20x  Active stretching, 3 x ea  L flank  LB center, R, center, L with gentle paraspinal sweeps                         PT Education - 07/27/18 1712    Education Details  Fwd power walk with Mitts; addition of 2# wts to plank work. Side plank with small star position    Person(s) Educated  Patient    Methods  Explanation;Demonstration    Comprehension  Verbalized understanding;Verbal cues required;Tactile cues required       PT Short Term Goals - 06/28/18 2003      PT SHORT TERM GOAL #1   Title  Be independent with initial home exercise program for self-management of symptoms.    Baseline  initial program provided at IE    Time  3    Period  Weeks    Status  New    Target Date  07/26/18        PT Long Term Goals - 06/28/18 2004      PT LONG TERM GOAL #1   Title  Be independent with a long-term home exercise program for self-management of symptoms.     Baseline  initial HEP provided at IE    Time  8    Period  Weeks    Status  New    Target Date  08/23/18      PT LONG TERM GOAL #2   Title  Demonstrate improved FOTO score by 10 units to demonstrate improvement in overall condition and self-reported functional ability.     Baseline  FOTO = 40 (06/28/2018)    Time  8    Period  Weeks    Status  New     Target Date  08/23/18      PT LONG TERM GOAL #3   Title  Reduce pain with functional activities to equal or less  than 3/10 to allow patient to complete usual activities including ADLs, IADLs, and social engagement with less difficulty.     Baseline  gets up to 10/10    Time  8    Period  Weeks    Status  New    Target Date  08/23/18      PT LONG TERM GOAL #4   Title  Improve lumbar extension AROM by 50% to allow patient to complete valued activities with less difficulty.     Baseline  25% of normal (06/28/2018)    Time  8    Period  Weeks    Status  New    Target Date  08/23/18      PT LONG TERM GOAL #5   Title  Patient will demonstrate B LE strength 4+/5 to demonstrate functional strength for independent gait, increased standing tolerance, reaching, lifting, carrying and increased ADL ability.    Baseline  see objective exam    Time  8    Period  Weeks    Status  New    Target Date  08/23/18            Plan - 07/27/18 1715    Clinical Impression Statement  Pt tolerated progression well; side plank challenging in star position, but able to perform with tactile cues to depress scapula, as pt will activate upper traps and rhomboids initially. Progressing well. Power walk demonstrated with good back alignment; continue to encourage bracing at core. Gentle back sweeps demonstrate quick skin flare in color without pain. Discussed massage through upper back/back for self care. Pt notes she has a massage gift certificate to book.     Rehab Potential  Fair    Clinical Impairments Affecting Rehab Potential  (+) motivation, history of self-management and preservation of function with long term condition; (-) complexity of condition, chronic nature of condition, multiple comorbidities    PT Frequency  2x / week    PT Duration  8 weeks    PT Treatment/Interventions  Aquatic Therapy;ADLs/Self Care Home Management;Biofeedback;Cryotherapy;Moist Heat;Electrical Stimulation;Gait training;Stair  training;Functional mobility training;Therapeutic activities;Therapeutic exercise;Balance training;Neuromuscular re-education;Patient/family education;Manual techniques;Passive range of motion;Dry needling;Joint Manipulations    PT Next Visit Plan  Assess response to HEP and modify. Progress exercises as tolerated. If on land consider Schroth based exercises. Aquatic consider assigning aquatic HEP as pt likes to go to pool on her own.     PT Home Exercise Plan  Medbridge Access Code: [email or web address]     Consulted and Agree with Plan of Care  Patient       Patient will benefit from skilled therapeutic intervention in order to improve the following deficits and impairments:  Abnormal gait, Decreased balance, Decreased endurance, Decreased mobility, Difficulty walking, Hypomobility, Increased muscle spasms, Impaired sensation, Decreased knowledge of precautions, Decreased range of motion, Decreased activity tolerance, Impaired perceived functional ability, Decreased strength, Impaired flexibility, Postural dysfunction, Pain  Visit Diagnosis: Chronic bilateral low back pain with right-sided sciatica  Paresthesia of skin  Muscle weakness (generalized)  Abnormal posture  Pain in right hip     Problem List Patient Active Problem List   Diagnosis Date Noted  . Chronic, continuous use of opioids 07/07/2018  . Gallstones 05/13/2018  . Right upper quadrant abdominal pain 04/28/2018  . Disorder of bursae of shoulder region 03/04/2018  . Bicipital tenosynovitis 03/04/2018  . Cervical radiculitis 03/04/2018  . Shoulder pain 03/04/2018  . External hemorrhoids without complication 95/28/4132  . Dyspareunia in female 12/10/2017  . Rectocele 04/11/2016  .  Cystocele, midline 04/11/2016  . Status post abdominal hysterectomy 04/10/2016  . Menopause 04/10/2016  . Essential hypertension 05/29/2015  . 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 Chronic pain of lower extremity (Right side) 05/09/2015  .  Chronic lumbar radicular pain (Right side) 05/09/2015  . Chronic right groin pain 05/09/2015  . Complication of implanted electronic neurostimulator of spinal cord ([place]) (battery depletion) 05/09/2015  . 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 HCV antibody positive 05/08/2015  . H/O adenomatous polyp of colon 05/08/2015  . Headache, migraine 05/08/2015  . Restless legs syndrome 05/08/2015  . Rotator cuff syndrome of shoulder and allied disorders 05/08/2015  . Hand tendinitis 05/08/2015  . Anxiety 12/12/2014  . Chronic pain associated with significant psychosocial dysfunction 11/22/2014  . 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 Atypical squamous cells of undetermined significance on cytologic smear of vagina (ASC-US) 11/22/2014  . Snapping thumb syndrome 11/22/2014  . Chronic pain syndrome 11/22/2014  . Disorder of skin and subcutaneous tissue 11/22/2014  . Hernia, inguinal, unilateral 05/01/2009  . Dyslipidemia 02/25/2007  . Degeneration of intervertebral disc of lumbosacral region 08/19/2006  . Degeneration of lumbar or lumbosacral intervertebral disc 08/19/2006  . Major depressive disorder with single episode, in partial remission ([place]) 04/16/2006  . Fatty liver 06/23/2005    Courtney Ross 07/27/2018, 5:21 PM  [place] MAIN [facility] SERVICES [address] [place], [place], [address] Phone: [phone]   Fax:  [phone]  Name: Courtney Ross MRN: [id number] Date of Birth: 1955/08/15

## 2018-07-29 ENCOUNTER — Ambulatory Visit: Payer: Medicare Other

## 2018-08-03 DIAGNOSIS — Z981 Arthrodesis status: Secondary | ICD-10-CM | POA: Diagnosis not present

## 2018-08-03 DIAGNOSIS — M4125 Other idiopathic scoliosis, thoracolumbar region: Secondary | ICD-10-CM | POA: Diagnosis not present

## 2018-08-05 ENCOUNTER — Ambulatory Visit: Payer: Medicare Other | Admitting: General Surgery

## 2018-08-05 ENCOUNTER — Telehealth: Payer: Self-pay | Admitting: *Deleted

## 2018-08-05 ENCOUNTER — Ambulatory Visit: Payer: Medicare Other

## 2018-08-05 NOTE — Telephone Encounter (Signed)
Patient cancelled appointment scheduled for 08-05-18 with Dr. Bary Castilla via the automated reminder system.   Message left for patient to call the office back to reschedule.

## 2018-08-09 ENCOUNTER — Encounter: Payer: Self-pay | Admitting: *Deleted

## 2018-08-10 ENCOUNTER — Other Ambulatory Visit: Payer: Self-pay

## 2018-08-10 ENCOUNTER — Ambulatory Visit: Payer: Medicare Other

## 2018-08-10 DIAGNOSIS — R202 Paresthesia of skin: Secondary | ICD-10-CM

## 2018-08-10 DIAGNOSIS — M47816 Spondylosis without myelopathy or radiculopathy, lumbar region: Secondary | ICD-10-CM | POA: Diagnosis not present

## 2018-08-10 DIAGNOSIS — M6281 Muscle weakness (generalized): Secondary | ICD-10-CM | POA: Diagnosis not present

## 2018-08-10 DIAGNOSIS — R293 Abnormal posture: Secondary | ICD-10-CM | POA: Diagnosis not present

## 2018-08-10 DIAGNOSIS — M25551 Pain in right hip: Secondary | ICD-10-CM | POA: Diagnosis not present

## 2018-08-10 DIAGNOSIS — M5441 Lumbago with sciatica, right side: Secondary | ICD-10-CM | POA: Diagnosis not present

## 2018-08-10 DIAGNOSIS — M41125 Adolescent idiopathic scoliosis, thoracolumbar region: Secondary | ICD-10-CM | POA: Diagnosis not present

## 2018-08-10 DIAGNOSIS — G8929 Other chronic pain: Secondary | ICD-10-CM

## 2018-08-10 NOTE — Therapy (Signed)
[place] MAIN [facility] SERVICES [address] [place], [place], [address] Phone: [phone]   Fax:  [phone]  Physical Therapy Treatment  Patient Details  Name: Courtney Ross MRN: [id number] Date of Birth: 1955-07-01 Referring Provider (PT):  Juanetta Beets Elayne Snare, MD.    Encounter Date: 08/10/2018  PT End of Session - 08/10/18 1013    Visit Number  9    Number of Visits  16    Date for PT Re-Evaluation  08/23/18    Authorization Type  UHC reporting period from 06/28/2018    Authorization Time Period  Current cert period: 2/0/9470 - 08/23/2018    Authorization - Visit Number  9    Authorization - Number of Visits  10    PT Start Time  0900    PT Stop Time  1005    PT Time Calculation (min)  65 min    Activity Tolerance  Patient tolerated treatment well    Behavior During Therapy  [facility] for tasks assessed/performed       Past Medical History:  Diagnosis Date  . Anxiety   . Back pain   . Colon polyp   . Constipation   . Degenerative disc disease, lumbar   . Depression   . Family history of adverse reaction to anesthesia    daughter nausea  . Fatty liver   . GERD (gastroesophageal reflux disease)   . H/O spinal fusion   . Headache    migraines  . Headache, migraine 05/08/2015  . High cholesterol   . Hypertension   . Lower back pain   . Polycystic ovarian disease   . PONV (postoperative nausea and vomiting)   . Restless leg syndrome   . Spasm 01/29/2009    Past Surgical History:  Procedure Laterality Date  . [place]  . APPENDECTOMY  1988  . ARTHRODESIS METATARSALPHALANGEAL JOINT (MTPJ) Left 12/14/2014   Procedure: ARTHRODESIS METATARSALPHALANGEAL JOINT (MTPJ) 1ST;  Surgeon: Albertine Patricia, DPM;  Location: [place];  Service: Podiatry;  Laterality: Left;  LMA  . BACK SURGERY  [phone]   x3  . BLADDER SUSPENSION  2006  . BREAST BIOPSY Left 1990's   core Sankar  . BREAST SURGERY  2005   lumpectomy  . CARPAL TUNNEL RELEASE Bilateral 02/26/07,05/21/07  . CHOLECYSTECTOMY N/A 05/03/2018   Procedure: LAPAROSCOPIC CHOLECYSTECTOMY WITH INTRAOPERATIVE CHOLANGIOGRAM;  Surgeon: Robert Bellow, MD;  Location: [facility];  Service: General;  Laterality: N/A;  . COLONOSCOPY    . COLPORRHAPHY    . DILATION AND CURETTAGE OF UTERUS    . [place]  . FOOT SURGERY    . HAMMER TOE SURGERY Left 03/22/2015   Procedure: HAMMER TOE CORRECTION L 2ND AND 3RD ;  Surgeon: Albertine Patricia, DPM;  Location: [place];  Service: Podiatry;  Laterality: Left;  WITH LOCAL  . HERNIA REPAIR Right 1988   inguinal  . NECK SURGERY  2006   fusion  . RECTOCELE REPAIR N/A 08/25/2016   Procedure: POSTERIOR REPAIR (RECTOCELE);  Surgeon: Brayton Mars, MD;  Location: [facility];  Service: Gynecology;  Laterality: N/A;  . SPINAL CORD STIMULATOR IMPLANT    . TONSILLECTOMY  1985    There were no vitals filed for this visit.  Subjective Assessment - 08/10/18 1017    Subjective  Pt has no unusual complaints of pain today (baseline of 4-5) Reports tolerating sessions well without increase in symptoms. Pt  notes she has her doctor and is leaning toward having back surgery, which will involve fusion; pt unsure of all of the details. Pt reports feeling much straighter with posture and less R sided tension.       Enters/exits via ramp  Ambulation warm up  4 L fwd  2 L side  2 L side with minisquat  High plank at step (2# ankle wts) stand rest btn exercises  Hip abd, B 2 x 10 ea  Jacks, 20x  SKTC, B 2 x 10  Mountain climber, 20x  1 L chest opener  Neutral working position; 90/90 lunge (water to mid upper arm) (brace back foot at bench) Alternate lunge each exercise   Core with UE work; red dumbbells    Triceps pressdowns with scap depression    Sh abd/add    Sh flex/ext    Horiz abd/add (dumbbells horiz)  Scapular depression scoot along bench, 1 L  Power walk, 2# ankle  wts (high waist deep water)  4 L fwd  4 L side lunge  Active stretching, 4x each  ham/gastroc and hip flexor/quad  LB center, R, center L  L flank/TFL/ITB  Independent walking; no wts x 20 min                         PT Education - 08/10/18 1011    Education Details  Core strengthing in neutral position (90/90 lunge) away from wall; resistive side lunge walking with 2# ankle wts. High plank position work at step       PT Short Term Goals - 06/28/18 2003      PT SHORT TERM GOAL #1   Title  Be independent with initial home exercise program for self-management of symptoms.    Baseline  initial program provided at IE    Time  3    Period  Weeks    Status  New    Target Date  07/26/18        PT Long Term Goals - 06/28/18 2004      PT LONG TERM GOAL #1   Title  Be independent with a long-term home exercise program for self-management of symptoms.     Baseline  initial HEP provided at IE    Time  8    Period  Weeks    Status  New    Target Date  08/23/18      PT LONG TERM GOAL #2   Title  Demonstrate improved FOTO score by 10 units to demonstrate improvement in overall condition and self-reported functional ability.     Baseline  FOTO = 40 (06/28/2018)    Time  8    Period  Weeks    Status  New    Target Date  08/23/18      PT LONG TERM GOAL #3   Title  Reduce pain with functional activities to equal or less than 3/10 to allow patient to complete usual activities including ADLs, IADLs, and social engagement with less difficulty.     Baseline  gets up to 10/10    Time  8    Period  Weeks    Status  New    Target Date  08/23/18      PT LONG TERM GOAL #4   Title  Improve lumbar extension AROM by 50% to allow patient to complete valued activities with less difficulty.     Baseline  25% of normal (06/28/2018)  Time  8    Period  Weeks    Status  New    Target Date  08/23/18      PT LONG TERM GOAL #5   Title  Patient will demonstrate B LE  strength 4+/5 to demonstrate functional strength for independent gait, increased standing tolerance, reaching, lifting, carrying and increased ADL ability.    Baseline  see objective exam    Time  8    Period  Weeks    Status  New    Target Date  08/23/18            Plan - 08/10/18 1013    Clinical Impression Statement  Pt tolerated increased work well with expected challenge to progress total body strength. Exercises this session working multiple muscle groups simultaneously with emphasis on core. Pt requires verbal cues demonstrating good corrections in form/posture with cues. Pt continues to require cues for slow/flow movement with active stretching and increased hold times. Continue to progress strength and flexibility for improved overall muscles balances and postures with functional movement/activity.     Rehab Potential  Fair    Clinical Impairments Affecting Rehab Potential  (+) motivation, history of self-management and preservation of function with long term condition; (-) complexity of condition, chronic nature of condition, multiple comorbidities    PT Frequency  2x / week    PT Duration  8 weeks    PT Treatment/Interventions  Aquatic Therapy;ADLs/Self Care Home Management;Biofeedback;Cryotherapy;Moist Heat;Electrical Stimulation;Gait training;Stair training;Functional mobility training;Therapeutic activities;Therapeutic exercise;Balance training;Neuromuscular re-education;Patient/family education;Manual techniques;Passive range of motion;Dry needling;Joint Manipulations    PT Next Visit Plan  Assess response to HEP and modify. Progress exercises as tolerated. If on land consider Schroth based exercises. Aquatic consider assigning aquatic HEP as pt likes to go to pool on her own.     PT Home Exercise Plan  Medbridge Access Code: [email or web address]     Consulted and Agree with Plan of Care  Patient       Patient will benefit from skilled therapeutic intervention in order to improve the  following deficits and impairments:  Abnormal gait, Decreased balance, Decreased endurance, Decreased mobility, Difficulty walking, Hypomobility, Increased muscle spasms, Impaired sensation, Decreased knowledge of precautions, Decreased range of motion, Decreased activity tolerance, Impaired perceived functional ability, Decreased strength, Impaired flexibility, Postural dysfunction, Pain  Visit Diagnosis: Chronic bilateral low back pain with right-sided sciatica  Paresthesia of skin  Muscle weakness (generalized)  Abnormal posture  Pain in right hip     Problem List Patient Active Problem List   Diagnosis Date Noted  . Chronic, continuous use of opioids 07/07/2018  . Gallstones 05/13/2018  . Right upper quadrant abdominal pain 04/28/2018  . Disorder of bursae of shoulder region 03/04/2018  . Bicipital tenosynovitis 03/04/2018  . Cervical radiculitis 03/04/2018  . Shoulder pain 03/04/2018  . External hemorrhoids without complication 40/34/7425  . Dyspareunia in female 12/10/2017  . Rectocele 04/11/2016  . Cystocele, midline 04/11/2016  . Status post abdominal hysterectomy 04/10/2016  . Menopause 04/10/2016  . Essential hypertension 05/29/2015  . 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 Chronic pain of lower extremity (Right side) 05/09/2015  . Chronic lumbar radicular pain (Right side) 05/09/2015  . Chronic right groin pain 05/09/2015  . Complication of implanted electronic neurostimulator of spinal cord ([place]) (battery depletion) 05/09/2015  . 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 HCV antibody positive 05/08/2015  . H/O adenomatous polyp of colon 05/08/2015  . Headache, migraine 05/08/2015  .  Restless legs syndrome 05/08/2015  . Rotator cuff syndrome of shoulder and allied disorders 05/08/2015  . Hand tendinitis 05/08/2015  . Anxiety 12/12/2014  . Chronic pain associated with significant psychosocial dysfunction  11/22/2014  . 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 Atypical squamous cells of undetermined significance on cytologic smear of vagina (ASC-US) 11/22/2014  . Snapping thumb syndrome 11/22/2014  . Chronic pain syndrome 11/22/2014  . Disorder of skin and subcutaneous tissue 11/22/2014  . Hernia, inguinal, unilateral 05/01/2009  . Dyslipidemia 02/25/2007  . Degeneration of intervertebral disc of lumbosacral region 08/19/2006  . Degeneration of lumbar or lumbosacral intervertebral disc 08/19/2006  . Major depressive disorder with single episode, in partial remission ([place]) 04/16/2006  . Fatty liver 06/23/2005    Courtney Ross 08/10/2018, 10:19 AM  [place] MAIN [facility] SERVICES [address] [place], [place], [address] Phone: [phone]   Fax:  [phone]  Name: Courtney Ross MRN: [id number] Date of Birth: 20-Oct-1955

## 2018-08-12 ENCOUNTER — Ambulatory Visit: Payer: Medicare Other

## 2018-08-12 ENCOUNTER — Other Ambulatory Visit: Payer: Self-pay

## 2018-08-12 DIAGNOSIS — G8929 Other chronic pain: Secondary | ICD-10-CM | POA: Diagnosis not present

## 2018-08-12 DIAGNOSIS — R293 Abnormal posture: Secondary | ICD-10-CM | POA: Diagnosis not present

## 2018-08-12 DIAGNOSIS — R202 Paresthesia of skin: Secondary | ICD-10-CM

## 2018-08-12 DIAGNOSIS — M6281 Muscle weakness (generalized): Secondary | ICD-10-CM | POA: Diagnosis not present

## 2018-08-12 DIAGNOSIS — M25551 Pain in right hip: Secondary | ICD-10-CM

## 2018-08-12 DIAGNOSIS — M5441 Lumbago with sciatica, right side: Principal | ICD-10-CM

## 2018-08-12 NOTE — Therapy (Signed)
[place] MAIN [facility] SERVICES [address] [place], [place], [address] Phone: [phone]   Fax:  [phone]  Physical Therapy Treatment  Patient Details  Name: Courtney Ross MRN: [id number] Date of Birth: 20-Jul-1955 Referring Provider (PT):  Juanetta Beets Elayne Snare, MD.    Encounter Date: 08/12/2018  PT End of Session - 08/12/18 1604    Visit Number  10    Number of Visits  16    Date for PT Re-Evaluation  08/23/18    Authorization Type  UHC reporting period from 06/28/2018    Authorization Time Period  Current cert period: 09/23/5954 - 08/23/2018    Authorization - Visit Number  10    Authorization - Number of Visits  10    PT Start Time  1330    PT Stop Time  1420    PT Time Calculation (min)  50 min    Activity Tolerance  Patient tolerated treatment well    Behavior During Therapy  [facility] for tasks assessed/performed       Past Medical History:  Diagnosis Date  . Anxiety   . Back pain   . Colon polyp   . Constipation   . Degenerative disc disease, lumbar   . Depression   . Family history of adverse reaction to anesthesia    daughter nausea  . Fatty liver   . GERD (gastroesophageal reflux disease)   . H/O spinal fusion   . Headache    migraines  . Headache, migraine 05/08/2015  . High cholesterol   . Hypertension   . Lower back pain   . Polycystic ovarian disease   . PONV (postoperative nausea and vomiting)   . Restless leg syndrome   . Spasm 01/29/2009    Past Surgical History:  Procedure Laterality Date  . [place]      [place]  . APPENDECTOMY  1988  . ARTHRODESIS METATARSALPHALANGEAL JOINT (MTPJ) Left 12/14/2014   Procedure: ARTHRODESIS METATARSALPHALANGEAL JOINT (MTPJ) 1ST;  Surgeon: Albertine Patricia, DPM;  Location: [place];  Service: Podiatry;  Laterality: Left;  LMA  . BACK SURGERY  [phone]   x3  . BLADDER SUSPENSION  2006  . BREAST BIOPSY Left 1990's   core Sankar  . BREAST SURGERY  2005    lumpectomy  . CARPAL TUNNEL RELEASE Bilateral 02/26/07,05/21/07  . CHOLECYSTECTOMY N/A 05/03/2018   Procedure: LAPAROSCOPIC CHOLECYSTECTOMY WITH INTRAOPERATIVE CHOLANGIOGRAM;  Surgeon: Robert Bellow, MD;  Location: [facility];  Service: General;  Laterality: N/A;  . COLONOSCOPY    . COLPORRHAPHY    . DILATION AND CURETTAGE OF UTERUS    . [place]  . FOOT SURGERY    . HAMMER TOE SURGERY Left 03/22/2015   Procedure: HAMMER TOE CORRECTION L 2ND AND 3RD ;  Surgeon: Albertine Patricia, DPM;  Location: [place];  Service: Podiatry;  Laterality: Left;  WITH LOCAL  . HERNIA REPAIR Right 1988   inguinal  . NECK SURGERY  2006   fusion  . RECTOCELE REPAIR N/A 08/25/2016   Procedure: POSTERIOR REPAIR (RECTOCELE);  Surgeon: Brayton Mars, MD;  Location: [facility];  Service: Gynecology;  Laterality: N/A;  . SPINAL CORD STIMULATOR IMPLANT    . TONSILLECTOMY  1985    There were no vitals filed for this visit.  Subjective Assessment - 08/12/18 1601    Subjective  Pt notes cervical pain today (due to chronic bulging disc in neck). Pt notes this pain comes and goes.  No other voiced complaints.     Pertinent History  Patient is a 63 y.o. female who presents to outpatient physical therapy with a referral for medical diagnosis adolescent idiopathic scoliosis of lumbar region with request to include aquatic therapy in her POC. This patient's chief complaints consist of pain, paresthesia, stiffness, weakness, decreased function leading to the following functional deficits: difficulty with basic ADLs, IADLs, walking, sitting, standing, lifting. Relevant past medical history and comorbidities: Does have a history of lumbar surgery (1993 for ruptured disc, spinal fusion in back and neck last surgery about 10 years ago). History of migraines (now rare).  History of inguinal nerve pain induced by surgery for atopic pregnancy that lead to right groin pain and associated with spinal cord  stimulator implant, history of "aflicted feet" (bilateral bunionectomy), bilateral carpal tunnel release. Denies history of cancer, diabetes, heart problems, lung problems, seizures.       Enters/exits via ramp  Ambulation warm up  Fwd 4 L   side 2 L   side with minisquat 2 L (initial cues required for proper squat form)  High plank at step with 2# ankle weights  2 x 10 ea, B with rest between each exercise   Hip ext   Hip abd   Hip flexion  High side plank with 2# ankle weights  with progressive low star form as tolerated 3 x 30 sec, B  Bench, 2# ankle wts, 2 x 10 ea, B  up and outs  Dynamic/traveling core/LE strength, 2# ankle wts  Fwd high knee march, 4 L  Bkwd walk, focus on straight posture with good hip ext, 4 L  Side step (long stride) B 4L ea  Active stretching, 3x ea  Hamstring/gastroc and hip flexor/quad  LB center, R, center L                             PT Education - 08/12/18 1603    Education Details  Progressive side plank work and dynamic core LE work with progressive walking activities    Person(s) Educated  Patient    Methods  Explanation;Demonstration    Comprehension  Verbalized understanding;Returned demonstration;Verbal cues required;Tactile cues required       PT Short Term Goals - 06/28/18 2003      PT SHORT TERM GOAL #1   Title  Be independent with initial home exercise program for self-management of symptoms.    Baseline  initial program provided at IE    Time  3    Period  Weeks    Status  New    Target Date  07/26/18        PT Long Term Goals - 06/28/18 2004      PT LONG TERM GOAL #1   Title  Be independent with a long-term home exercise program for self-management of symptoms.     Baseline  initial HEP provided at IE    Time  8    Period  Weeks    Status  New    Target Date  08/23/18      PT LONG TERM GOAL #2   Title  Demonstrate improved FOTO score by 10 units to demonstrate improvement in overall  condition and self-reported functional ability.     Baseline  FOTO = 40 (06/28/2018)    Time  8    Period  Weeks    Status  New    Target Date  08/23/18  PT LONG TERM GOAL #3   Title  Reduce pain with functional activities to equal or less than 3/10 to allow patient to complete usual activities including ADLs, IADLs, and social engagement with less difficulty.     Baseline  gets up to 10/10    Time  8    Period  Weeks    Status  New    Target Date  08/23/18      PT LONG TERM GOAL #4   Title  Improve lumbar extension AROM by 50% to allow patient to complete valued activities with less difficulty.     Baseline  25% of normal (06/28/2018)    Time  8    Period  Weeks    Status  New    Target Date  08/23/18      PT LONG TERM GOAL #5   Title  Patient will demonstrate B LE strength 4+/5 to demonstrate functional strength for independent gait, increased standing tolerance, reaching, lifting, carrying and increased ADL ability.    Baseline  see objective exam    Time  8    Period  Weeks    Status  New    Target Date  08/23/18            Plan - 08/12/18 1605    Clinical Impression Statement  Pt continues to progress well with strengthening exercises; progressed side planks and dynamic strengthening with increased ambulation activities with 2# ankle weights. Pt R sided glut/glut med weakness continues to be evident, but improving as noted by improved ability to perform exercises without compensatory movements (cues required intermittently) Continue to progress all strength and decrease muscle imbalances.     Rehab Potential  Fair    Clinical Impairments Affecting Rehab Potential  (+) motivation, history of self-management and preservation of function with long term condition; (-) complexity of condition, chronic nature of condition, multiple comorbidities    PT Frequency  2x / week    PT Duration  8 weeks    PT Treatment/Interventions  Aquatic Therapy;ADLs/Self Care Home  Management;Biofeedback;Cryotherapy;Moist Heat;Electrical Stimulation;Gait training;Stair training;Functional mobility training;Therapeutic activities;Therapeutic exercise;Balance training;Neuromuscular re-education;Patient/family education;Manual techniques;Passive range of motion;Dry needling;Joint Manipulations    PT Next Visit Plan  Assess response to HEP and modify. Progress exercises as tolerated. If on land consider Schroth based exercises. Aquatic consider assigning aquatic HEP as pt likes to go to pool on her own.     PT Home Exercise Plan  Medbridge Access Code: [email or web address]     Consulted and Agree with Plan of Care  Patient       Patient will benefit from skilled therapeutic intervention in order to improve the following deficits and impairments:  Abnormal gait, Decreased balance, Decreased endurance, Decreased mobility, Difficulty walking, Hypomobility, Increased muscle spasms, Impaired sensation, Decreased knowledge of precautions, Decreased range of motion, Decreased activity tolerance, Impaired perceived functional ability, Decreased strength, Impaired flexibility, Postural dysfunction, Pain  Visit Diagnosis: Chronic bilateral low back pain with right-sided sciatica  Paresthesia of skin  Muscle weakness (generalized)  Abnormal posture  Pain in right hip     Problem List Patient Active Problem List   Diagnosis Date Noted  . Chronic, continuous use of opioids 07/07/2018  . Gallstones 05/13/2018  . Right upper quadrant abdominal pain 04/28/2018  . Disorder of bursae of shoulder region 03/04/2018  . Bicipital tenosynovitis 03/04/2018  . Cervical radiculitis 03/04/2018  . Shoulder pain 03/04/2018  . External hemorrhoids without complication 41/93/7902  . Dyspareunia in female 12/10/2017  .  Rectocele 04/11/2016  . Cystocele, midline 04/11/2016  . Status post abdominal hysterectomy 04/10/2016  . Menopause 04/10/2016  . Essential hypertension 05/29/2015  . Failed back  surgical syndrome 05/09/2015  . Chronic pain of lower extremity (Right side) 05/09/2015  . Chronic lumbar radicular pain (Right side) 05/09/2015  . Chronic right groin pain 05/09/2015  . Complication of implanted electronic neurostimulator of spinal cord ([place]) (battery depletion) 05/09/2015  . 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 HCV antibody positive 05/08/2015  . H/O adenomatous polyp of colon 05/08/2015  . Headache, migraine 05/08/2015  . Restless legs syndrome 05/08/2015  . Rotator cuff syndrome of shoulder and allied disorders 05/08/2015  . Hand tendinitis 05/08/2015  . Anxiety 12/12/2014  . Chronic pain associated with significant psychosocial dysfunction 11/22/2014  . 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 Atypical squamous cells of undetermined significance on cytologic smear of vagina (ASC-US) 11/22/2014  . Snapping thumb syndrome 11/22/2014  . Chronic pain syndrome 11/22/2014  . Disorder of skin and subcutaneous tissue 11/22/2014  . Hernia, inguinal, unilateral 05/01/2009  . Dyslipidemia 02/25/2007  . Degeneration of intervertebral disc of lumbosacral region 08/19/2006  . Degeneration of lumbar or lumbosacral intervertebral disc 08/19/2006  . Major depressive disorder with single episode, in partial remission ([place]) 04/16/2006  . Fatty liver 06/23/2005    Larae Grooms 08/12/2018, 4:12 PM  [place] MAIN [facility] SERVICES [address] [place], [place], [address] Phone: [phone]   Fax:  [phone]  Name: Courtney Ross MRN: [id number] Date of Birth: 1956/01/14

## 2018-08-13 ENCOUNTER — Other Ambulatory Visit: Payer: Self-pay | Admitting: Obstetrics and Gynecology

## 2018-08-16 ENCOUNTER — Other Ambulatory Visit: Payer: Self-pay

## 2018-08-16 MED ORDER — MIRABEGRON ER 25 MG PO TB24: 25.0000 mg | ORAL_TABLET | Freq: Every day | ORAL | 1 refills | Status: DC

## 2018-08-18 ENCOUNTER — Ambulatory Visit: Payer: Medicare Other | Admitting: Physical Therapy

## 2018-08-18 DIAGNOSIS — M25551 Pain in right hip: Secondary | ICD-10-CM

## 2018-08-18 DIAGNOSIS — M5441 Lumbago with sciatica, right side: Principal | ICD-10-CM

## 2018-08-18 DIAGNOSIS — R293 Abnormal posture: Secondary | ICD-10-CM

## 2018-08-18 DIAGNOSIS — G8929 Other chronic pain: Secondary | ICD-10-CM | POA: Diagnosis not present

## 2018-08-18 DIAGNOSIS — M6281 Muscle weakness (generalized): Secondary | ICD-10-CM

## 2018-08-18 DIAGNOSIS — R202 Paresthesia of skin: Secondary | ICD-10-CM

## 2018-08-18 NOTE — Therapy (Signed)
[facility] [address] [place], [address] Phone: [phone]   Fax:  [phone]  Physical Therapy Re-certification / Progress Note /  Treatment Reporting period: 06/28/2018 - 08/18/2018  Patient Details  Name: Courtney Ross MRN: [id number] Date of Birth: 09-09-1955 Referring Provider (PT):  Joanette Gula, MD.    Encounter Date: 08/18/2018  PT End of Session - 08/18/18 1128    Visit Number  11    Number of Visits  27    Date for PT Re-Evaluation  10/13/18    Authorization Type  [employer] reporting period from 06/28/2018    Authorization Time Period  Current cert period: 9/37/3428 - 10/13/2018    Authorization - Visit Number  1    Authorization - Number of Visits  10    PT Start Time  1117    PT Stop Time  1200    PT Time Calculation (min)  43 min    Activity Tolerance  Patient tolerated treatment well    Behavior During Therapy  [facility] for tasks assessed/performed       Past Medical History:  Diagnosis Date  . Anxiety   . Back pain   . Colon polyp   . Constipation   . Degenerative disc disease, lumbar   . Depression   . Family history of adverse reaction to anesthesia    daughter nausea  . Fatty liver   . GERD (gastroesophageal reflux disease)   . H/O spinal fusion   . Headache    migraines  . Headache, migraine 05/08/2015  . High cholesterol   . Hypertension   . Lower back pain   . Polycystic ovarian disease   . PONV (postoperative nausea and vomiting)   . Restless leg syndrome   . Spasm 01/29/2009    Past Surgical History:  Procedure Laterality Date  . [place]  . APPENDECTOMY  1988  . ARTHRODESIS METATARSALPHALANGEAL JOINT (MTPJ) Left 12/14/2014   Procedure: ARTHRODESIS METATARSALPHALANGEAL JOINT (MTPJ) 1ST;  Surgeon: Albertine Patricia, DPM;  Location: [facility];  Service: Podiatry;  Laterality: Left;  LMA  . BACK SURGERY  [phone]   x3  . BLADDER  SUSPENSION  2006  . BREAST BIOPSY Left 1990's   core [place]  . BREAST SURGERY  2005   lumpectomy  . CARPAL TUNNEL RELEASE Bilateral 02/26/07,05/21/07  . CHOLECYSTECTOMY N/A 05/03/2018   Procedure: LAPAROSCOPIC CHOLECYSTECTOMY WITH INTRAOPERATIVE CHOLANGIOGRAM;  Surgeon: Robert Bellow, MD;  Location: [facility];  Service: General;  Laterality: N/A;  . COLONOSCOPY    . COLPORRHAPHY    . DILATION AND CURETTAGE OF UTERUS    . [place]  . FOOT SURGERY    . HAMMER TOE SURGERY Left 03/22/2015   Procedure: HAMMER TOE CORRECTION L 2ND AND 3RD ;  Surgeon: Albertine Patricia, DPM;  Location: [facility];  Service: Podiatry;  Laterality: Left;  WITH LOCAL  . HERNIA REPAIR Right 1988   inguinal  . NECK SURGERY  2006   fusion  . RECTOCELE REPAIR N/A 08/25/2016   Procedure: POSTERIOR REPAIR (RECTOCELE);  Surgeon: Brayton Mars, MD;  Location: [facility];  Service: Gynecology;  Laterality: N/A;  . SPINAL CORD STIMULATOR IMPLANT    . TONSILLECTOMY  1985    There were no vitals filed for this visit.  Subjective Assessment - 08/18/18 1120    Subjective  Patient reports she is feeling better with  aquatic therapy. She saw her referring physician who did a radiograph that showed some improvement in her curve. They are planning to do myelogram in April and she has not yet had the bone density scan yet due to some mix ups in scheduling.  Her pain has been about the same. Being in the water is "realling helping me." She can tell it easos off the pain when she is in the water. She feels that she is sore for a day or so. She doesn't feel a lot of changes outside of the water. She reports current pain 4/10 in right sided back with numbness and tingling down right leg to foot. The surgeon is planning to operate on the neck and then the low back (during the same surgery).     Pertinent History  Patient is a 63 y.o. female who presents to outpatient physical therapy with a referral for  medical diagnosis adolescent idiopathic scoliosis of lumbar region with request to include aquatic therapy in her POC. This patient's chief complaints consist of pain, paresthesia, stiffness, weakness, decreased function leading to the following functional deficits: difficulty with basic ADLs, IADLs, walking, sitting, standing, lifting. Relevant past medical history and comorbidities: Does have a history of lumbar surgery (1993 for ruptured disc, spinal fusion in back and neck last surgery about 10 years ago). History of migraines (now rare).  History of inguinal nerve pain induced by surgery for atopic pregnancy that lead to right groin pain and associated with spinal cord stimulator implant, history of "aflicted feet" (bilateral bunionectomy), bilateral carpal tunnel release. Denies history of cancer, diabetes, heart problems, lung problems, seizures.     Limitations  Sitting;Lifting;Standing;House hold activities;Other (comment)    How long can you sit comfortably?  <20 min (leaning away from right side)    How long can you stand comfortably?  <15 min    How long can you walk comfortably?  < 1-5 miles (may be over 2 times)    Diagnostic tests  Lumbar and Thoracic CT scan WO contrast 02/15/2018 (see report for detail, 33 degrees of dextroscoliosis and 56 degress of kyphosis in thoracic spine and  21 degree levoscoliosis  mentiones right foraminal stenosis due to bne fragment at L5-5, lipoma of the filum terminale unchanged).    Patient Stated Goals  "wants to have more movement without pain, more core strength, and more cardio"    Currently in Pain?  Yes    Pain Score  4     Pain Location  Back    Pain Orientation  Right;Lower    Pain Descriptors / Indicators  Aching    Pain Type  Chronic pain    Pain Radiating Towards   Pt tolerated treatment well. She was better at completing HEP this time but had difficulty at work. She presents overall better and did not require clinician procedures to abolish pain  today. Continues to respond to sagittal plane. Gave alternatives for HEP when unable to lay down. At this point she continues to require 2x per week attendance and compliant performance of HEP to be able to progress according to expected potential. Will assess response to repeated motions over time at next session. Consider progressing forces in sagittal plane or going lateral at next session if no better. Pt informed about possible soreness in arms and low back. Educated about importance of adherence to HEP to make improvement and continuing the assessment progress to know if she needs force progression or alternatives. Pt was able to complete  all exercises with minimal to no lasting increase in pain or discomfort. Pt required multimodal cuing for proper technique and to facilitate improved neuromuscular control, strength, range of motion, and functional ability resulting in improved performance and form. Patient is making progress towards goals while in clinic at this point but is hindered by lack of participation on HEP or attending physical therapy sessions. Patient will benefit from skilled physical therapy intervention to address current body structure impairments and activity limitations to improve function and work towards goals set in current POC in order to return to prior level of function or maximal functional improvement.     Pain Onset  More than a month ago    Pain Frequency  Constant    Aggravating Factors   sitting and standing, riding for a long time, getting up after sitting a long time    Pain Relieving Factors  pushing a stroller or shopping cart when walking, walking in water, laying on left side with pillow between knees    Effect of Pain on Daily Activities  Severely restricts her usual activities         [facility] PT Assessment - 08/18/18 0001      Assessment   Medical Diagnosis  Adolescent idiopathic scoliosis of lumbar region    Referring Provider (PT)   Joanette Gula, MD.      Next MD Visit  Around Aug 03, 2018    Prior Therapy  [facility] for low back pain with improvement several years ago      Precautions   Precautions  Other (comment)    Precaution Comments  lifting precautions (do not lift 63 month old)      Restrictions   Weight Bearing Restrictions  No      Prior Function   Level of Independence  Independent    Vocation  On disability    Leisure  spending time with 52 year old daughter, church, traveling.      Cognition   Overall Cognitive Status  Within Functional Limits for tasks assessed      Observation/Other Assessments   Observations  see note from 08/18/2018 for lates objective data    Focus on Therapeutic Outcomes (FOTO)   41        OBJECTIVE: OBSERVATION/INSPECTION: Patient presents withrounded shoulders,flatten of lumbar lordosis,hyperkyphosis of upper thoracic spine with forward head and rounded shoulders. Spinal curves consistent with AIS noted withright thoracicconvexity and sided rib hump, left lumbarconvexity. Right iliac crest slightly higherthan left.  NEUROLOGICAL: Dermatomes:diminished to light touch L2-S1 on right compared to left except for L4.  Myotomes:right L3-4, S1, S2 diminished, mild eversion ratcheting (better than last assessment).  Reflexes:   Quadriceps reflex (L4): R =1+, L =2+.  Achilles reflex (S1): R =absent, L =2+. Upper Motor Neuron Screen: Hoffman's, and Clonus (ankle) negative bilaterally.  SPINE MOTION Lumbar AROM:  *Indicates pain  Flexion: = low shins, reproduced leg pain/tingling.  Extension: =25% ERP in leg.   Rotation: R =WFL, L =WFL  Side Flexion: R =50% painful on R, L =40% painful R.  STRENGTH:  *Indicates pain Hip  Flexion: R =4/5* in back, L =5/5.  Extension: R =4/5(motion restricted), L =4/5(painful to lift).  Abduction: R =3+/5, L =4+/5. Knee  Ext: R =4+/5 (mild ratcheting), L =5/5.  Flex: R =4/5 (mild ratcheting), L =4+/5.   Ankle (seated position)  Able to heel and toe walk.   Eversion: R = 4+/5 (mild ratcheting); L = 5/5  FUNCTIONAL MOBILITY:  Bed  mobility:supine <> sit and rolling I with some discomfort.  Transfers:sit <> stand I with some discomfort.  Gait:I for community and household distances, antalgic gait at times favoring right.  Objective measurements completed on examination: See above findings.  TREATMENT: Therapeutic exercise:to centralize symptoms and improve ROM and strength required for successful completion of functional activities.  - examination to assess progress (see above) - Education on diagnosis, prognosis, POC, anatomy and physiology of current condition.  - Standing rows with scapular retraction for improved postural and shoulder girdle strengthening and mobility. Required instruction for technique and cuing to retract, posteriorly tilt, and depress scapulae. x30 reps black theraband. - Standing B SHLD extension with scapular retraction for improved postural and shoulder girdle strengthening and mobility. Required instruction for technique and cuing to retract, posteriorly tilt, and depress scapulae as well as keep elbows extended.  x30 with green theraband. - Standing pallof press (multifidus press) with single green theraband 2x10 each side. Cuing for trunk activation and posture. To strengthen trunk rotators and hips.  - Side stepping with red theraband wrapped around legs and held at umbilicus to activate core and hip stabilizers and abductors in functional weightbearing position. Cuing to activate abdominals. Stepping a few steps out and in as allowed by band x 20 each side. Also tried yellow (too easy) and green (too hard).  - Education on HEP including handout   HOME EXERCISE PROGRAM Access Code: [email or web address]  URL: [email or web address]  Date: 08/18/2018  Prepared by: Rosita Kea   Exercises  Seated Correct Posture  Slouch Overcorrect in Chair - 10  reps - 5 second hold - 1 Sets - 2-4x daily  Standing Bilateral Low Shoulder Row with Anchored Resistance - 10-15 reps - 1 second hold - 3 Sets - 1-2x daily  Shoulder extension with resistance - Neutral - 10-15 reps - 1 second hold - 3 Sets - 1-2x daily  Standing Anti-Rotation Press with Anchored Resistance - 10-15 reps - 1 second hold - 3 Sets - 1-2x daily  X Band Walk - 10-15 reps - 1 second hold - 3 Sets - 1-2x daily     PT Short Term Goals - 08/18/18 1131      PT SHORT TERM GOAL #1   Title  Be independent with initial home exercise program for self-management of symptoms.    Baseline  initial program provided at IE    Time  3    Period  Weeks    Status  Achieved    Target Date  07/26/18        PT Long Term Goals - 08/18/18 1131      PT LONG TERM GOAL #1   Title  Be independent with a long-term home exercise program for self-management of symptoms.     Baseline  initial HEP provided at IE and has not yet received final HEP due to ongoing need for therapy.     Time  8    Period  Weeks    Status  Partially Met    Target Date  10/13/18      PT LONG TERM GOAL #2   Title  Demonstrate improved FOTO score by 10 units to demonstrate improvement in overall condition and self-reported functional ability.     Baseline  FOTO = 40 (06/28/2018); FOTO = 41 (08/18/2018);    Time  8    Period  Weeks    Status  On-going    Target Date  09/15/18      PT LONG  TERM GOAL #3   Title  Reduce pain with functional activities to equal or less than 3/10 to allow patient to complete usual activities including ADLs, IADLs, and social engagement with less difficulty.     Baseline  gets up to 9/10 (08/18/2018);     Time  8    Period  Weeks    Status  Partially Met    Target Date  10/13/18      PT LONG TERM GOAL #4   Title  Improve lumbar extension AROM by 50% to allow patient to complete valued activities with less difficulty.     Baseline  25% of normal (06/28/2018)    Time  8    Period  Weeks     Status  On-going    Target Date  10/13/18      PT LONG TERM GOAL #5   Title  Patient will demonstrate B LE strength 4+/5 to demonstrate functional strength for independent gait, increased standing tolerance, reaching, lifting, carrying and increased ADL ability.    Baseline  see objective exam    Time  8    Period  Weeks    Status  Partially Met    Target Date  10/13/18            Plan - 08/18/18 1302    Clinical Impression Statement  Patient has attended 11 physical therapy sessions and has made progress towards some goals at this point. Objectively, she demonstrates improved strength and activity tolerance and less pain with lumbar range of motion. Subjectively, she reports getting relief while in the pool and ability to be more active during aquatic therapy. She feels that aquatic therapy is helping her a lot but she has not noticed significant improvement in her daily life. She is still going through the process to assess her appropriateness for surgery and and her physician wants her to continue strengthening in preparation to improve her overall outcome.  Patient is a 63 y.o. female referred to outpatient physical therapy with a medical diagnosis of Adolescent idiopathic scoliosis of lumbar region who presents with signs and symptoms consistent with low back pain with right sided radiculopathy. She demonstrates impairments including pain, paresthesia, stiffness, weakness, decreased function leading to the following functional deficits: difficulty with basic ADLs, IADLs, walking, sitting, standing, lifting. Patient would benefit from continued skilled physical therapy interventions to work towards stated goals and maximal possible function.     Rehab Potential  Fair    Clinical Impairments Affecting Rehab Potential  (+) motivation, history of self-management and preservation of function with long term condition; (-) complexity of condition, chronic nature of condition, multiple  comorbidities    PT Frequency  2x / week    PT Duration  8 weeks    PT Treatment/Interventions  Aquatic Therapy;ADLs/Self Care Home Management;Biofeedback;Cryotherapy;Moist Heat;Electrical Stimulation;Gait training;Stair training;Functional mobility training;Therapeutic activities;Therapeutic exercise;Balance training;Neuromuscular re-education;Patient/family education;Manual techniques;Passive range of motion;Dry needling;Joint Manipulations    PT Next Visit Plan  continue aquatic therapy    PT Home Exercise Plan  Medbridge Access Code: [email or web address]     Consulted and Agree with Plan of Care  Patient       Patient will benefit from skilled therapeutic intervention in order to improve the following deficits and impairments:  Abnormal gait, Decreased balance, Decreased endurance, Decreased mobility, Difficulty walking, Hypomobility, Increased muscle spasms, Impaired sensation, Decreased knowledge of precautions, Decreased range of motion, Decreased activity tolerance, Impaired perceived functional ability, Decreased strength, Impaired flexibility, Postural dysfunction, Pain  Visit Diagnosis:  Chronic bilateral low back pain with right-sided sciatica  Paresthesia of skin  Muscle weakness (generalized)  Abnormal posture  Pain in right hip     Problem List Patient Active Problem List   Diagnosis Date Noted  . Chronic, continuous use of opioids 07/07/2018  . Gallstones 05/13/2018  . Right upper quadrant abdominal pain 04/28/2018  . Disorder of bursae of shoulder region 03/04/2018  . Bicipital tenosynovitis 03/04/2018  . Cervical radiculitis 03/04/2018  . Shoulder pain 03/04/2018  . External hemorrhoids without complication 46/28/6381  . Dyspareunia in female 12/10/2017  . Rectocele 04/11/2016  . Cystocele, midline 04/11/2016  . Status post abdominal hysterectomy 04/10/2016  . Menopause 04/10/2016  . Essential hypertension 05/29/2015  . 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  Chronic pain of lower extremity (Right side) 05/09/2015  . Chronic lumbar radicular pain (Right side) 05/09/2015  . Chronic right groin pain 05/09/2015  . Complication of implanted electronic neurostimulator of spinal cord ([place]) (battery depletion) 05/09/2015  . 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 HCV antibody positive 05/08/2015  . H/O adenomatous polyp of colon 05/08/2015  . Headache, migraine 05/08/2015  . Restless legs syndrome 05/08/2015  . Rotator cuff syndrome of shoulder and allied disorders 05/08/2015  . Hand tendinitis 05/08/2015  . Anxiety 12/12/2014  . Chronic pain associated with significant psychosocial dysfunction 11/22/2014  . 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 Atypical squamous cells of undetermined significance on cytologic smear of vagina (ASC-US) 11/22/2014  . Snapping thumb syndrome 11/22/2014  . Chronic pain syndrome 11/22/2014  . Disorder of skin and subcutaneous tissue 11/22/2014  . Hernia, inguinal, unilateral 05/01/2009  . Dyslipidemia 02/25/2007  . Degeneration of intervertebral disc of lumbosacral region 08/19/2006  . Degeneration of lumbar or lumbosacral intervertebral disc 08/19/2006  . Major depressive disorder with single episode, in partial remission ([place]) 04/16/2006  . Fatty liver 06/23/2005    Nancy Nordmann, PT, DPT 08/18/2018, 1:03 PM  [facility] [address] [place], [address] Phone: [phone]   Fax:  [phone]  Name: Courtney Ross MRN: [id number] Date of Birth: 1955-10-05

## 2018-08-19 DIAGNOSIS — K56609 Unspecified intestinal obstruction, unspecified as to partial versus complete obstruction: Secondary | ICD-10-CM | POA: Diagnosis not present

## 2018-08-19 DIAGNOSIS — R197 Diarrhea, unspecified: Secondary | ICD-10-CM | POA: Diagnosis not present

## 2018-08-19 DIAGNOSIS — Z7982 Long term (current) use of aspirin: Secondary | ICD-10-CM | POA: Diagnosis not present

## 2018-08-19 DIAGNOSIS — Z9049 Acquired absence of other specified parts of digestive tract: Secondary | ICD-10-CM | POA: Diagnosis not present

## 2018-08-19 DIAGNOSIS — K439 Ventral hernia without obstruction or gangrene: Secondary | ICD-10-CM | POA: Diagnosis not present

## 2018-08-19 DIAGNOSIS — Z888 Allergy status to other drugs, medicaments and biological substances status: Secondary | ICD-10-CM | POA: Diagnosis not present

## 2018-08-19 DIAGNOSIS — Z981 Arthrodesis status: Secondary | ICD-10-CM | POA: Diagnosis not present

## 2018-08-19 DIAGNOSIS — R1010 Upper abdominal pain, unspecified: Secondary | ICD-10-CM | POA: Diagnosis not present

## 2018-08-19 DIAGNOSIS — R110 Nausea: Secondary | ICD-10-CM | POA: Diagnosis not present

## 2018-08-19 DIAGNOSIS — I10 Essential (primary) hypertension: Secondary | ICD-10-CM | POA: Diagnosis not present

## 2018-08-20 ENCOUNTER — Telehealth: Payer: Self-pay | Admitting: Family Medicine

## 2018-08-20 ENCOUNTER — Other Ambulatory Visit: Payer: Self-pay | Admitting: Family Medicine

## 2018-08-20 DIAGNOSIS — Z01818 Encounter for other preprocedural examination: Secondary | ICD-10-CM

## 2018-08-20 DIAGNOSIS — M5137 Other intervertebral disc degeneration, lumbosacral region: Secondary | ICD-10-CM

## 2018-08-20 DIAGNOSIS — M51379 Other intervertebral disc degeneration, lumbosacral region without mention of lumbar back pain or lower extremity pain: Secondary | ICD-10-CM

## 2018-08-20 MED ORDER — OXYCODONE HCL 15 MG PO TABS: 15.0000 mg | ORAL_TABLET | Freq: Four times a day (QID) | ORAL | 0 refills | Status: DC | PRN

## 2018-08-20 NOTE — Telephone Encounter (Signed)
Please review. Thanks!  

## 2018-08-20 NOTE — Telephone Encounter (Signed)
Why is she requesting bone density? Is not recommended until age 63 unless there is a medical reason that puts her at high risk for osteoporosis.

## 2018-08-20 NOTE — Telephone Encounter (Signed)
Patient reports that she has severe scoliosis (in a 45 degree angle), and [facility] (Dr. Para Skeans)  wants to make sure that her bone is dense enough before they can put a rod in her back. She reports that they told her to have this ordered through her PCP.

## 2018-08-20 NOTE — Telephone Encounter (Signed)
Ok to order? Please advise.

## 2018-08-20 NOTE — Telephone Encounter (Signed)
Pt needs a refill   Oxycoodone 15 mg  [facility]  Thanks teri

## 2018-08-20 NOTE — Telephone Encounter (Signed)
Pt is needing a referral for a bone density to [facility].  Thanks, [employer]

## 2018-08-24 ENCOUNTER — Encounter: Payer: Self-pay | Admitting: Family Medicine

## 2018-08-24 ENCOUNTER — Ambulatory Visit: Payer: Medicare Other | Attending: Neurosurgery

## 2018-08-24 ENCOUNTER — Other Ambulatory Visit: Payer: Self-pay

## 2018-08-24 DIAGNOSIS — M5441 Lumbago with sciatica, right side: Secondary | ICD-10-CM | POA: Diagnosis not present

## 2018-08-24 DIAGNOSIS — R293 Abnormal posture: Secondary | ICD-10-CM | POA: Diagnosis not present

## 2018-08-24 DIAGNOSIS — R202 Paresthesia of skin: Secondary | ICD-10-CM | POA: Insufficient documentation

## 2018-08-24 DIAGNOSIS — M6281 Muscle weakness (generalized): Secondary | ICD-10-CM | POA: Diagnosis not present

## 2018-08-24 DIAGNOSIS — G8929 Other chronic pain: Secondary | ICD-10-CM | POA: Insufficient documentation

## 2018-08-24 DIAGNOSIS — M25551 Pain in right hip: Secondary | ICD-10-CM

## 2018-08-24 NOTE — Therapy (Addendum)
[place] MAIN [facility] SERVICES [address] [place], [place], [address] Phone: [phone]   Fax:  [phone]  Physical Therapy Treatment  Patient Details  Name: Courtney Ross MRN: [id number] Date of Birth: [birth year] Referring Provider (PT):  Juanetta Beets Elayne Snare, MD.    Encounter Date: 08/24/2018  PT End of Session - 08/24/18 1227    Visit Number  12    Number of Visits  27    Date for PT Re-Evaluation  10/13/18    Authorization Type  [employer] reporting period from 06/28/2018    Authorization Time Period  Current cert period: 9/57/4734 - 10/13/2018    Authorization - Visit Number  2    Authorization - Number of Visits  10    PT Start Time  0800    PT Stop Time  0900    PT Time Calculation (min)  60 min    Activity Tolerance  Patient tolerated treatment well    Behavior During Therapy  [facility] for tasks assessed/performed       Past Medical History:  Diagnosis Date  . Anxiety   . Back pain   . Colon polyp   . Constipation   . Degenerative disc disease, lumbar   . Depression   . Family history of adverse reaction to anesthesia    daughter nausea  . Fatty liver   . GERD (gastroesophageal reflux disease)   . H/O spinal fusion   . Headache    migraines  . Headache, migraine 05/08/2015  . High cholesterol   . Hypertension   . Lower back pain   . Polycystic ovarian disease   . PONV (postoperative nausea and vomiting)   . Restless leg syndrome   . Spasm 01/29/2009    Past Surgical History:  Procedure Laterality Date  . [place]  . APPENDECTOMY  1988  . ARTHRODESIS METATARSALPHALANGEAL JOINT (MTPJ) Left 12/14/2014   Procedure: ARTHRODESIS METATARSALPHALANGEAL JOINT (MTPJ) 1ST;  Surgeon: Albertine Patricia, DPM;  Location: [facility];  Service: Podiatry;  Laterality: Left;  LMA  . BACK SURGERY  [phone]   x3  . BLADDER SUSPENSION  2006  . BREAST BIOPSY Left 1990's   core [place]  . BREAST SURGERY  2005    lumpectomy  . CARPAL TUNNEL RELEASE Bilateral 02/26/07,05/21/07  . CHOLECYSTECTOMY N/A 05/03/2018   Procedure: LAPAROSCOPIC CHOLECYSTECTOMY WITH INTRAOPERATIVE CHOLANGIOGRAM;  Surgeon: Robert Bellow, MD;  Location: [facility];  Service: General;  Laterality: N/A;  . COLONOSCOPY    . COLPORRHAPHY    . DILATION AND CURETTAGE OF UTERUS    . [place]  . FOOT SURGERY    . HAMMER TOE SURGERY Left 03/22/2015   Procedure: HAMMER TOE CORRECTION L 2ND AND 3RD ;  Surgeon: Albertine Patricia, DPM;  Location: [facility];  Service: Podiatry;  Laterality: Left;  WITH LOCAL  . HERNIA REPAIR Right 1988   inguinal  . NECK SURGERY  2006   fusion  . RECTOCELE REPAIR N/A 08/25/2016   Procedure: POSTERIOR REPAIR (RECTOCELE);  Surgeon: Brayton Mars, MD;  Location: [facility];  Service: Gynecology;  Laterality: N/A;  . SPINAL CORD STIMULATOR IMPLANT    . TONSILLECTOMY  1985    There were no vitals filed for this visit.  Subjective Assessment - 08/24/18 1221    Subjective  Pt reports new complication of having a hernia (seen in ED due to sudden out pouching of hernia over  the weekend. Pt has appt with surgeon this week. Symptoms have since eased. Pt reports "usual" aches/discomfort in mid and low back, but overall feels she is tolerating activity better without increasing pain.     Pertinent History  Patient is a 63 y.o. female who presents to outpatient physical therapy with a referral for medical diagnosis adolescent idiopathic scoliosis of lumbar region with request to include aquatic therapy in her POC. This patient's chief complaints consist of pain, paresthesia, stiffness, weakness, decreased function leading to the following functional deficits: difficulty with basic ADLs, IADLs, walking, sitting, standing, lifting. Relevant past medical history and comorbidities: Does have a history of lumbar surgery (1993 for ruptured disc, spinal fusion in back and neck last surgery about  10 years ago). History of migraines (now rare).  History of inguinal nerve pain induced by surgery for atopic pregnancy that lead to right groin pain and associated with spinal cord stimulator implant, history of "aflicted feet" (bilateral bunionectomy), bilateral carpal tunnel release. Denies history of cancer, diabetes, heart problems, lung problems, seizures.      Enters/exits via ramp  Ambulation warm up  Fwd 4 L   Side 2 L   side with squat 2 L  Core with LE work, 2# ankle wts  20x ea   Hip abd/add   Hip flex/ext   Squats  High plank (at step)  2# ankle wts, B 2 x 10 ea   Hip ext   Hip abd  Suspended work (1 min ea); 6.25 sec total  Jog  Jack   Ski  X jack  [employer]  2 L recovery walk with chest opener stretch  Bench; core with LE, 2 x 10 ea B  Up and outs  SKTC; low ab lifts  Core with UE work in neutral working lunge position  [employer], 20x ea   Triceps press downs   Sh abd/add   Sh flex/ext   Sh horiz abd/add  B Lat pull down with noodle in neutral squat working position  Active stretching, 3 x ea  Hamstrings/gastroc  Hip flexor/quad  LB R, center, L, center with paraspinal/upper glute/fascia sweeps                         PT Education - 08/24/18 1225    Education Details  Reinforced abdominal bracing during exercise and not allowing pain or valsalva type maneuver to abdomen.     Person(s) Educated  Patient    Methods  Explanation    Comprehension  Verbalized understanding       PT Short Term Goals - 08/18/18 1131      PT SHORT TERM GOAL #1   Title  Be independent with initial home exercise program for self-management of symptoms.    Baseline  initial program provided at IE    Time  3    Period  Weeks    Status  Achieved    Target Date  07/26/18        PT Long Term Goals - 08/18/18 1131      PT LONG TERM GOAL #1   Title  Be independent with a long-term home exercise program for self-management of symptoms.      Baseline  initial HEP provided at IE and has not yet received final HEP due to ongoing need for therapy.     Time  8    Period  Weeks    Status  Partially Met    Target  Date  10/13/18      PT LONG TERM GOAL #2   Title  Demonstrate improved FOTO score by 10 units to demonstrate improvement in overall condition and self-reported functional ability.     Baseline  FOTO = 40 (06/28/2018); FOTO = 41 (08/18/2018);    Time  8    Period  Weeks    Status  On-going    Target Date  09/15/18      PT LONG TERM GOAL #3   Title  Reduce pain with functional activities to equal or less than 3/10 to allow patient to complete usual activities including ADLs, IADLs, and social engagement with less difficulty.     Baseline  gets up to 9/10 (08/18/2018);     Time  8    Period  Weeks    Status  Partially Met    Target Date  10/13/18      PT LONG TERM GOAL #4   Title  Improve lumbar extension AROM by 50% to allow patient to complete valued activities with less difficulty.     Baseline  25% of normal (06/28/2018)    Time  8    Period  Weeks    Status  On-going    Target Date  10/13/18      PT LONG TERM GOAL #5   Title  Patient will demonstrate B LE strength 4+/5 to demonstrate functional strength for independent gait, increased standing tolerance, reaching, lifting, carrying and increased ADL ability.    Baseline  see objective exam    Time  8    Period  Weeks    Status  Partially Met    Target Date  10/13/18            Plan - 08/24/18 1228    Clinical Impression Statement  Pt tolerated session well without increase in symptoms. Continues to require intermittent cues in plank for with exercises for bracing scapula. Quad weakness noted with up and outs, but can maintain knee extension against resistance with focus. Active back stretches with gentle sweeps to paraspinals and through upper glutes. Continue PT to progress strength and body mechanics with activity and improve flexibility to improve muscle  imbalances.     Rehab Potential  Fair    Clinical Impairments Affecting Rehab Potential  (+) motivation, history of self-management and preservation of function with long term condition; (-) complexity of condition, chronic nature of condition, multiple comorbidities    PT Frequency  2x / week    PT Duration  8 weeks    PT Treatment/Interventions  Aquatic Therapy;ADLs/Self Care Home Management;Biofeedback;Cryotherapy;Moist Heat;Electrical Stimulation;Gait training;Stair training;Functional mobility training;Therapeutic activities;Therapeutic exercise;Balance training;Neuromuscular re-education;Patient/family education;Manual techniques;Passive range of motion;Dry needling;Joint Manipulations    PT Next Visit Plan  continue aquatic therapy    PT Home Exercise Plan  Medbridge Access Code: [email or web address]     Consulted and Agree with Plan of Care  Patient       Patient will benefit from skilled therapeutic intervention in order to improve the following deficits and impairments:  Abnormal gait, Decreased balance, Decreased endurance, Decreased mobility, Difficulty walking, Hypomobility, Increased muscle spasms, Impaired sensation, Decreased knowledge of precautions, Decreased range of motion, Decreased activity tolerance, Impaired perceived functional ability, Decreased strength, Impaired flexibility, Postural dysfunction, Pain  Visit Diagnosis: Chronic bilateral low back pain with right-sided sciatica  Paresthesia of skin  Muscle weakness (generalized)  Abnormal posture  Pain in right hip     Problem List Patient Active Problem List   Diagnosis  Date Noted  . Chronic, continuous use of opioids 07/07/2018  . Gallstones 05/13/2018  . Right upper quadrant abdominal pain 04/28/2018  . Disorder of bursae of shoulder region 03/04/2018  . Bicipital tenosynovitis 03/04/2018  . Cervical radiculitis 03/04/2018  . Shoulder pain 03/04/2018  . External hemorrhoids without complication 54/36/0677  .  Dyspareunia in female 12/10/2017  . Rectocele 04/11/2016  . Cystocele, midline 04/11/2016  . Status post abdominal hysterectomy 04/10/2016  . Menopause 04/10/2016  . Essential hypertension 05/29/2015  . 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 Chronic pain of lower extremity (Right side) 05/09/2015  . Chronic lumbar radicular pain (Right side) 05/09/2015  . Chronic right groin pain 05/09/2015  . Complication of implanted electronic neurostimulator of spinal cord ([place]) (battery depletion) 05/09/2015  . 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 HCV antibody positive 05/08/2015  . H/O adenomatous polyp of colon 05/08/2015  . Headache, migraine 05/08/2015  . Restless legs syndrome 05/08/2015  . Rotator cuff syndrome of shoulder and allied disorders 05/08/2015  . Hand tendinitis 05/08/2015  . Anxiety 12/12/2014  . Chronic pain associated with significant psychosocial dysfunction 11/22/2014  . 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 Atypical squamous cells of undetermined significance on cytologic smear of vagina (ASC-US) 11/22/2014  . Snapping thumb syndrome 11/22/2014  . Chronic pain syndrome 11/22/2014  . Disorder of skin and subcutaneous tissue 11/22/2014  . Hernia, inguinal, unilateral 05/01/2009  . Dyslipidemia 02/25/2007  . Degeneration of intervertebral disc of lumbosacral region 08/19/2006  . Degeneration of lumbar or lumbosacral intervertebral disc 08/19/2006  . Major depressive disorder with single episode, in partial remission ([place]) 04/16/2006  . Fatty liver 06/23/2005    Larae Grooms 08/24/2018, 12:41 PM  [place] MAIN [facility] SERVICES [address] [place], [place], [address] Phone: [phone]   Fax:  [phone]  Name: Courtney Ross MRN: [id number] Date of Birth: 21-Sep-1955

## 2018-08-26 ENCOUNTER — Other Ambulatory Visit: Payer: Self-pay

## 2018-08-26 ENCOUNTER — Ambulatory Visit: Payer: Medicare Other

## 2018-08-26 ENCOUNTER — Encounter: Payer: Self-pay | Admitting: General Surgery

## 2018-08-26 ENCOUNTER — Ambulatory Visit (INDEPENDENT_AMBULATORY_CARE_PROVIDER_SITE_OTHER): Payer: Medicare Other | Admitting: General Surgery

## 2018-08-26 VITALS — BP 112/78 | HR 96 | Temp 98.1°F | Resp 18 | Ht 68.0 in | Wt 169.8 lb

## 2018-08-26 DIAGNOSIS — K439 Ventral hernia without obstruction or gangrene: Secondary | ICD-10-CM | POA: Diagnosis not present

## 2018-08-26 NOTE — Patient Instructions (Signed)
Hernia, Adult     A hernia is the bulging of an organ or tissue through a weak spot in the muscles of the abdomen (abdominal wall). Hernias develop most often near the belly button (navel) or the area where the leg meets the lower abdomen (groin). Common types of hernias include:  Incisional hernia. This type bulges through a scar from an abdominal surgery.  Umbilical hernia. This type develops near the navel.  Inguinal hernia. This type develops in the groin or scrotum.  Femoral hernia. This type develops under the groin, in the upper thigh area.  Hiatal hernia. This type occurs when part of the stomach slides above the muscle that separates the abdomen from the chest (diaphragm). What are the causes? This condition may be caused by:  Heavy lifting.  Coughing over a long period of time.  Straining to have a bowel movement. Constipation can lead to straining.  An incision made during an abdominal surgery.  A physical problem that is present at birth (congenital defect).  Being overweight or obese.  Smoking.  Excess fluid in the abdomen.  Undescended testicles in males. What are the signs or symptoms? The main symptom is a skin-colored, rounded bulge in the area of the hernia. However, a bulge may not always be present. It may grow bigger or be more visible when you cough or strain (such as when lifting something heavy). A hernia that can be pushed back into the area (is reducible) rarely causes pain. A hernia that cannot be pushed back into the area (is incarcerated) may lose its blood supply (become strangulated). A hernia that is incarcerated may cause:  Pain.  Fever.  Nausea and vomiting.  Swelling.  Constipation. How is this diagnosed? A hernia may be diagnosed based on:  Your symptoms and medical history.  A physical exam. Your health care provider may ask you to cough or move in certain ways to see if the hernia becomes visible.  Imaging tests, such  as: ? X-rays. ? Ultrasound. ? CT scan. How is this treated? A hernia that is small and painless may not need to be treated. A hernia that is large or painful may be treated with surgery. Inguinal hernias may be treated with surgery to prevent incarceration or strangulation. Strangulated hernias are always treated with surgery because a lack of blood supply to the trapped organ or tissue can cause it to die. Surgery to treat a hernia involves pushing the bulge back into place and repairing the weak area of the muscle or abdominal wall. Follow these instructions at home: Activity  Avoid straining.  Do not lift anything that is heavier than 10 lb (4.5 kg), or the limit that you are told, until your health care provider says that it is safe.  When lifting heavy objects, lift with your leg muscles, not your back muscles. Preventing constipation  Take actions to prevent constipation. Constipation leads to straining with bowel movements, which can make a hernia worse or cause a hernia repair to break down. Your health care provider may recommend that you: ? Drink enough fluid to keep your urine pale yellow. ? Eat foods that are high in fiber, such as fresh fruits and vegetables, whole grains, and beans. ? Limit foods that are high in fat and processed sugars, such as fried or sweet foods. ? Take an over-the-counter or prescription medicine for constipation. General instructions  When coughing, try to cough gently.  You may try to push the hernia back in place   grains, and beans.  ? Limit foods that are high in fat and processed sugars, such as fried or sweet foods.  ? Take an over-the-counter or prescription medicine for constipation.  General instructions  · When coughing, try to cough gently.  · You may try to push the hernia back in place by very gently pressing on it while lying down. Do not try to force the bulge back in if it will not push in easily.  · If you are overweight, work with your health care provider to lose weight safely.  · Do not use any products that contain nicotine or tobacco, such as cigarettes and e-cigarettes. If you need help quitting, ask your health care provider.  · If you are scheduled for hernia repair, watch your hernia for any changes in shape,  size, or color. Tell your health care provider about any changes or new symptoms.  · Take over-the-counter and prescription medicines only as told by your health care provider.  · Keep all follow-up visits as told by your health care provider. This is important.  Contact a health care provider if:  · You develop new pain, swelling, or redness around your hernia.  · You have signs of constipation, such as:  ? Fewer bowel movements in a week than normal.  ? Difficulty having a bowel movement.  ? Stools that are dry, hard, or larger than normal.  Get help right away if:  · You have a fever.  · You have abdomen pain that gets worse.  · You feel nauseous or you vomit.  · You cannot push the hernia back in place by very gently pressing on it while lying down. Do not try to force the bulge back in if it will not push in easily.  · The hernia:  ? Changes in shape, size, or color.  ? Feels hard or tender.  These symptoms may represent a serious problem that is an emergency. Do not wait to see if the symptoms will go away. Get medical help right away. Call your local emergency services (911 in the [place]  Summary  · A hernia is the bulging of an organ or tissue through a weak spot in the muscles of the abdomen (abdominal wall).  · The main symptom is a skin-colored, rounded lump (bulge) in the hernia area. However, a bulge may not always be present. It may grow bigger or more visible when you cough or strain (such as when having a bowel movement).  · A hernia that is small and painless may not need to be treated. A hernia that is large or painful may be treated with surgery.  · Surgery to treat a hernia involves pushing the bulge back into place and repairing the weak part of the abdomen.  This information is not intended to replace advice given to you by your health care provider. Make sure you discuss any questions you have with your health care provider.  Document Released: 06/09/2005 Document Revised: 03/11/2017 Document  Reviewed: 03/11/2017  Elsevier Interactive Patient Education © 2019 [employer]

## 2018-08-26 NOTE — Progress Notes (Signed)
Patient ID: Courtney Ross, female   DOB: 07/05/1955, 63 y.o.   MRN: [id number]  Chief Complaint  Patient presents with  . Follow-up     pt last seen in 2019 ref Dr.Fisher colonoscopy last one 2014 [facility]/ also eval hernia    HPI Courtney Ross is a 63 y.o. female here today to discuss colonoscopy and possible ventral hernia the patient noticed a bulge above the umbilicus in the last few months and has significant pain on August 20, 2018 when [facility]  [place] at which time a CT was completed showing evidence of a ventral hernia without bowel entrapment.  She has been less symptomatic since that visit.  When the pain is most severe she does experience some nausea.    Bowels move every 2-3 days, with the use of Linzess.  HPI  Past Medical History:  Diagnosis Date  . Anxiety   . Back pain   . Colon polyp   . Constipation   . Degenerative disc disease, lumbar   . Depression   . Family history of adverse reaction to anesthesia    daughter nausea  . Fatty liver   . GERD (gastroesophageal reflux disease)   . H/O spinal fusion   . Headache    migraines  . Headache, migraine 05/08/2015  . High cholesterol   . Hypertension   . Lower back pain   . Polycystic ovarian disease   . PONV (postoperative nausea and vomiting)   . Restless leg syndrome   . Spasm 01/29/2009    Past Surgical History:  Procedure Laterality Date  . [place]  . APPENDECTOMY  1988  . ARTHRODESIS METATARSALPHALANGEAL JOINT (MTPJ) Left 12/14/2014   Procedure: ARTHRODESIS METATARSALPHALANGEAL JOINT (MTPJ) 1ST;  Surgeon: Albertine Patricia, DPM;  Location: [facility];  Service: Podiatry;  Laterality: Left;  LMA  . BACK SURGERY  [phone]   x3  . BLADDER SUSPENSION  2006  . BREAST BIOPSY Left 1990's   core Sankar  . BREAST SURGERY  2005   lumpectomy  . CARPAL TUNNEL RELEASE Bilateral 02/26/07,05/21/07  . CHOLECYSTECTOMY N/A 05/03/2018   Procedure:  LAPAROSCOPIC CHOLECYSTECTOMY WITH INTRAOPERATIVE CHOLANGIOGRAM;  Surgeon: Robert Bellow, MD;  Location: [facility];  Service: General;  Laterality: N/A;  . COLONOSCOPY    . COLPORRHAPHY    . DILATION AND CURETTAGE OF UTERUS    . [place]  . FOOT SURGERY    . HAMMER TOE SURGERY Left 03/22/2015   Procedure: HAMMER TOE CORRECTION L 2ND AND 3RD ;  Surgeon: Albertine Patricia, DPM;  Location: [facility];  Service: Podiatry;  Laterality: Left;  WITH LOCAL  . HERNIA REPAIR Right 1988   inguinal  . NECK SURGERY  2006   fusion  . RECTOCELE REPAIR N/A 08/25/2016   Procedure: POSTERIOR REPAIR (RECTOCELE);  Surgeon: Brayton Mars, MD;  Location: [facility];  Service: Gynecology;  Laterality: N/A;  . SPINAL CORD STIMULATOR IMPLANT    . TONSILLECTOMY  1985    Family History  Problem Relation Age of Onset  . Diabetes Father   . Hypertension Father   . CAD Father   . Diabetes Sister        non-Insulin dependent diabetes mellitus  . Heart disease Brother   . Cancer Daughter        cancer of the cervix at age 69-18  . Heart disease Brother   . Breast cancer Paternal  Aunt   . Colon cancer Paternal Uncle   . Ovarian cancer Neg Hx     Social History Social History   Tobacco Use  . Smoking status: Never Smoker  . Smokeless tobacco: Never Used  Substance Use Topics  . Alcohol use: No    Alcohol/week: 0.0 standard drinks    Frequency: Never  . Drug use: No    Allergies  Allergen Reactions  . Hydrocodone Itching  . Lisinopril Cough  . Mirtazapine     Weight gain  . Other Nausea And Vomiting    general anesthesia    Current Outpatient Medications  Medication Sig Dispense Refill  . AMITIZA 24 MCG capsule TAKE 1 CAPSULE BY MOUTH TWICE DAILY WITHA MEAL (Patient taking differently: Take 24 mcg by mouth 2 (two) times daily with a meal. ) 60 capsule 4  . aspirin EC 81 MG tablet Take 81 mg by mouth at bedtime.    Marland Kitchen buPROPion (WELLBUTRIN SR) 150 MG 12 hr  tablet Take 1 tablet (150 mg total) by mouth 3 (three) times daily. 90 tablet 3  . Calcium Carbonate (CALCI-CHEW PO) Take 2 tablets by mouth daily.    . Cholecalciferol (VITAMIN D3) 5000 UNITS CAPS Take 5,000 Units by mouth daily.     . cyclobenzaprine (FLEXERIL) 10 MG tablet Take 10 mg by mouth 3 (three) times daily as needed for muscle spasms.    . diazepam (VALIUM) 10 MG tablet TAKE 1 TABLET BY MOUTH EVERY 6 HOURS AS NEEDED FOR ANXIETY (Patient taking differently: Take 10 mg by mouth every 6 (six) hours as needed for anxiety. ) 120 tablet 5  . docusate sodium (COLACE) 100 MG capsule Take 100 mg by mouth daily as needed for moderate constipation.     Marland Kitchen estradiol (ESTRACE) 1 MG tablet Take 1 tablet (1 mg total) by mouth daily. 90 tablet 1  . fluticasone (FLONASE) 50 MCG/ACT nasal spray Place 1 spray into both nostrils daily as needed for allergies or rhinitis.    . hydroquinone 4 % cream Apply 1 application topically daily as needed (dark spots).   1  . LINZESS 145 MCG CAPS capsule Take 145 mcg by mouth daily before breakfast.     . meloxicam (MOBIC) 15 MG tablet Take 15 mg by mouth at bedtime.     . Multiple Vitamin (MULTIVITAMIN WITH MINERALS) TABS tablet Take 1 tablet by mouth daily.    . mupirocin cream (BACTROBAN) 2 % Apply to affected aread twice daily as needed. (Patient taking differently: Apply 1 application topically 2 (two) times daily as needed (irritation). Apply to affected aread twice daily as needed.) 15 g 2  . omeprazole (PRILOSEC) 40 MG capsule Take 1 capsule (40 mg total) by mouth daily. 30 capsule 1  . oxyCODONE (ROXICODONE) 15 MG immediate release tablet Take 1-2 tablets (15-30 mg total) by mouth 4 (four) times daily as needed. 180 tablet 0  . phentermine 15 MG capsule Take 1 capsule (15 mg total) by mouth every morning. (Patient taking differently: Take 15 mg by mouth 3 (three) times a week. ) 30 capsule 2  . polyethylene glycol (MIRALAX / GLYCOLAX) packet Take 17 g by mouth 2  (two) times daily as needed for mild constipation (dissolve in an 8 oz glass of water). 14 each 1  . pregabalin (LYRICA) 75 MG capsule TAKE 1 CAPSULE BY MOUTH 3 TIMES DAILY (Patient taking differently: Take 75 mg by mouth 3 (three) times daily. ) 90 capsule 4  . RESTASIS  0.05 % ophthalmic emulsion Place 1 drop into both eyes 2 (two) times daily.     Marland Kitchen rOPINIRole (REQUIP) 1 MG tablet TAKE 1 TABLET AT BEDTIME (Patient taking differently: Take 1 mg by mouth at bedtime. ) 30 tablet 11  . rosuvastatin (CRESTOR) 10 MG tablet Take 1 tablet (10 mg total) by mouth daily. 30 tablet 3   No current facility-administered medications for this visit.     Review of Systems Review of Systems  Constitutional: Negative.   Respiratory: Negative.   Cardiovascular: Negative.     Blood pressure 112/78, pulse 96, temperature 98.1 F (36.7 C), temperature source Temporal, resp. rate 18, height 5\' 8"  (1.727 m), weight 169 lb 12.8 oz (77 kg), SpO2 99 %.  Physical Exam Physical Exam Constitutional:      Appearance: She is well-developed.  Eyes:     General: No scleral icterus.    Conjunctiva/sclera: Conjunctivae normal.  Neck:     Musculoskeletal: Normal range of motion.  Cardiovascular:     Rate and Rhythm: Normal rate and regular rhythm.     Heart sounds: Normal heart sounds.  Pulmonary:     Effort: Pulmonary effort is normal.     Breath sounds: Normal breath sounds.  Chest:     Breasts: Breasts are symmetrical.        Right: No inverted nipple, mass, nipple discharge, skin change or tenderness.        Left: No inverted nipple, mass, nipple discharge, skin change or tenderness.  Abdominal:     General: Bowel sounds are normal.     Palpations: Abdomen is soft.     Hernia: A hernia is present. Hernia is present in the ventral area.       Comments: Ventral hernia   Lymphadenopathy:     Cervical: No cervical adenopathy.  Skin:    General: Skin is warm and dry.  Neurological:     Mental Status:  She is alert and oriented to person, place, and time.  Psychiatric:        Behavior: Behavior normal.     Data Reviewed August 20, 2018 CT results from [facility]: IMPRESSION:  -- No acute abnormality of the abdomen or pelvis. No bowel obstruction as clinically questioned.  -- Ventral abdominal hernia containing fat and small bowel without evidence of strangulation.  CBC from August 19, 2018 showed a hemoglobin  of 12,600 with an MCV of 79, white blood cell count of 6000.  Normal differential.  Comprehensive metabolic panel was normal with a creatinine of 0.88 and estimated GFR of 70.  Assessment Ventral hernia possibly related to open, Hassan technique for initial port placement at the time of her cholecystectomy last fall.  Plan  Indication for elective repair were reviewed.  Its difficult to be sure how sizable the fascial defect is and she may be a candidate for prosthetic mesh.  Pros and cons of this were reviewed and she is amenable for placement if indicated.  Hernia precautions and incarceration were discussed with the patient. If they develop symptoms of an incarcerated hernia, they were encouraged to seek prompt medical attention.  I have recommended repair of the hernia using mesh on an outpatient basis in the near future. The risk of infection was reviewed. The role of prosthetic mesh to minimize the risk of recurrence was reviewed.  Colonoscopy will be put on hold pending her hernia repair.  HPI, assessment, plan and physical exam has been scribed under the direction and in the presence  of Robert Bellow, MD. Karie Fetch, RN HPI, Physical Exam, Assessment and Plan have been scribed under the direction and in the presence of Hervey Ard, Md.  Eudelia Bunch R. Bobette Mo, CMA  I have completed the exam and reviewed the above documentation for accuracy and completeness.  I agree with the above.  Haematologist has been used and any errors in dictation or transcription are  unintentional.  Hervey Ard, M.D., F.A.C.S.  Forest Gleason Indigo Chaddock 08/27/2018, 3:26 PM  Patient's surgery to be scheduled for  09-01-18 at [facility] with Dr. Bary Castilla. It is okay for patient to continue an 81 mg aspirin once daily.   The patient is aware she will be contacted by the [facility] to complete a phone interview sometime in the near future.  The patient is aware to call the office should she have further questions.   Dominga Ferry, CMA

## 2018-08-27 DIAGNOSIS — K439 Ventral hernia without obstruction or gangrene: Secondary | ICD-10-CM | POA: Insufficient documentation

## 2018-08-30 ENCOUNTER — Encounter
Admission: RE | Admit: 2018-08-30 | Discharge: 2018-08-30 | Disposition: A | Discharge status: Home / Self Care | Payer: Medicare Other | Source: Ambulatory Visit | Attending: General Surgery | Admitting: General Surgery

## 2018-08-30 ENCOUNTER — Other Ambulatory Visit: Payer: Self-pay

## 2018-08-30 DIAGNOSIS — Z01812 Encounter for preprocedural laboratory examination: Secondary | ICD-10-CM | POA: Insufficient documentation

## 2018-08-30 DIAGNOSIS — K439 Ventral hernia without obstruction or gangrene: Secondary | ICD-10-CM | POA: Diagnosis not present

## 2018-08-30 DIAGNOSIS — Z7951 Long term (current) use of inhaled steroids: Secondary | ICD-10-CM | POA: Diagnosis not present

## 2018-08-30 DIAGNOSIS — M5136 Other intervertebral disc degeneration, lumbar region: Secondary | ICD-10-CM | POA: Diagnosis not present

## 2018-08-30 DIAGNOSIS — G2581 Restless legs syndrome: Secondary | ICD-10-CM | POA: Diagnosis not present

## 2018-08-30 DIAGNOSIS — K5900 Constipation, unspecified: Secondary | ICD-10-CM | POA: Diagnosis not present

## 2018-08-30 DIAGNOSIS — K760 Fatty (change of) liver, not elsewhere classified: Secondary | ICD-10-CM | POA: Diagnosis not present

## 2018-08-30 DIAGNOSIS — F329 Major depressive disorder, single episode, unspecified: Secondary | ICD-10-CM | POA: Diagnosis not present

## 2018-08-30 DIAGNOSIS — Z981 Arthrodesis status: Secondary | ICD-10-CM | POA: Diagnosis not present

## 2018-08-30 DIAGNOSIS — Z79899 Other long term (current) drug therapy: Secondary | ICD-10-CM | POA: Diagnosis not present

## 2018-08-30 DIAGNOSIS — K219 Gastro-esophageal reflux disease without esophagitis: Secondary | ICD-10-CM | POA: Diagnosis not present

## 2018-08-30 DIAGNOSIS — E7800 Pure hypercholesterolemia, unspecified: Secondary | ICD-10-CM | POA: Diagnosis not present

## 2018-08-30 DIAGNOSIS — E039 Hypothyroidism, unspecified: Secondary | ICD-10-CM | POA: Diagnosis not present

## 2018-08-30 DIAGNOSIS — Z791 Long term (current) use of non-steroidal anti-inflammatories (NSAID): Secondary | ICD-10-CM | POA: Diagnosis not present

## 2018-08-30 DIAGNOSIS — I10 Essential (primary) hypertension: Secondary | ICD-10-CM | POA: Diagnosis not present

## 2018-08-30 DIAGNOSIS — Z7982 Long term (current) use of aspirin: Secondary | ICD-10-CM | POA: Diagnosis not present

## 2018-08-30 DIAGNOSIS — F419 Anxiety disorder, unspecified: Secondary | ICD-10-CM | POA: Diagnosis not present

## 2018-08-30 DIAGNOSIS — Z79890 Hormone replacement therapy (postmenopausal): Secondary | ICD-10-CM | POA: Diagnosis not present

## 2018-08-30 LAB — CBC
HCT: 43.3 % (ref 36.0–46.0)
Hemoglobin: 13.5 g/dL (ref 12.0–15.0)
MCH: 25.1 pg — ABNORMAL LOW (ref 26.0–34.0)
MCHC: 31.2 g/dL (ref 30.0–36.0)
MCV: 80.6 fL (ref 80.0–100.0)
Platelets: 286 10*3/uL (ref 150–400)
RBC: 5.37 MIL/uL — ABNORMAL HIGH (ref 3.87–5.11)
RDW: 13.6 % (ref 11.5–15.5)
WBC: 6.1 10*3/uL (ref 4.0–10.5)
nRBC: 0 % (ref 0.0–0.2)

## 2018-08-30 NOTE — Patient Instructions (Addendum)
INSTRUCTIONS FOR SURGERY     Your surgery is scheduled for: Wednesday, September 01, 2018       To find out your arrival time for the day of surgery,          please call [phone] between 1 pm and 3 pm on : Tuesday, August 31, 2018     When you arrive for surgery, report to the [facility].       Do NOT stop on the first floor to register.    REMEMBER: Instructions that are not followed completely may result in serious medical risk,  up to and including death, or upon the discretion of your surgeon and anesthesiologist,            your surgery may need to be rescheduled.  __X__ 1. Do not eat food after midnight the night before your procedure.                    No gum, candy, lozenger, tic tacs, tums or hard candies.                  ABSOLUTELY NOTHING SOLID IN YOUR MOUTH AFTER MIDNIGHT                    You may drink unlimited clear liquids up to 2 hours before you are scheduled to arrive for surgery.                   Do not drink anything within those 2 hours unless you need to take medicine, then take the                   smallest amount you need.  Clear liquids include:  water, apple juice without pulp,                   any flavor Gatorade, Black coffee, black tea.  Sugar may be added but no dairy/ honey /lemon.                        Broth and jello is not considered a clear liquid.  __x__  2. On the morning of surgery, please brush your teeth with toothpaste and water. You may rinse with                  mouthwash if you wish but DO NOT SWALLOW TOOTHPASTE OR MOUTHWASH  __X___3. NO alcohol for 24 hours before or after surgery.  __x___ 4.  Do NOT smoke or use e-cigarettes for 24 HOURS PRIOR TO SURGERY.                      DO NOT Use any chewable tobacco products for at least 6 hours prior to surgery.  __x___ 5. If you start any new medication after this appointment and prior to surgery, please              Bring it with you on the day of surgery.  ___x__ 6. Notify your doctor if there is any change in your medical condition, such as  fever, infection, vomitting, diarrhea.  __x___ 7.  USE the CHG SOAP as instructed, the night before surgery and the day of surgery.                   Once you have washed with this soap, do NOT use any of the following: Powders, perfumes or lotions.                   Please do not wear make up, hairpins, clips or nail polish. You MAY wear deodorant.                   Men may shave their face and neck.  Women need to shave 48 hours prior to surgery.                   DO NOT wear ANY jewelry on the day of surgery. If there are rings that are too tight to remove easily,                     please address this prior to the surgery day. Piercings need to be removed.                                                                     NO METAL ON YOUR BODY.                    Do NOT bring any valuables.                      If you came to Pre-Admit testing then you will not need license, insurance card or credit card.                      If you will be staying overnight, please either leave your things in the car or have your family be                     responsible for these items.                     [facility] IS NOT RESPONSIBLE FOR BELONGINGS OR VALUABLES.  ___X__ 8. DO NOT wear contact lenses on surgery day.  You may not have dentures,                     Hearing aides, contacts or glasses in the operating room. These items can be                    Placed in the Recovery Room to receive immediately after surgery.  __x___ 9. IF YOU ARE SCHEDULED TO GO HOME ON THE SAME DAY, YOU MUST                   Have someone to drive you home and to stay with you  for the first 24 hours.                    Have an arrangement prior to arriving on surgery day.  ___x__ 10. Take the following medications on the morning of surgery with a sip  of water:                               1. WELLBUTRIN                     2. PRILOSEC                     3. VALIUM                     4. OXYCODONE                     5. LYRICA                     6. FLONASE, IF NEEDED  _____ 11.  Follow any instructions provided to you by your surgeon.                        Such as enema, clear liquid bowel prep  __X__  12. STOP  ASPIRIN AS OF: TODAY                       THIS INCLUDES BC POWDERS / GOODIES POWDER  __x___ 13. STOP Anti-inflammatories as of:  TODAY                      This includes IBUPROFEN / MOTRIN / ADVIL / ALEVE/ NAPROXYN / MOBIC                    YOU MAY TAKE TYLENOL ANY TIME PRIOR TO SURGERY.  _X____ 14.  Stop supplements until after surgery.                     This includes: CALICUM CHEWS / MULTIVITAMINS                  You may continue taking Vitamin B12 / Vitamin D3 but do not take on the morning of surgery.  _____ 15. Bring your CPAP machine into preop with you on the morning of surgery.  ______16.  Stop Metformin 2 full days prior to surgery.  Stop on:                     TAKE 1/2 OF USUAL INSULIN DOSE ON THE EVENING PRIOR TO SURGERY.                     Do NOT take any diabetes medications on surgery day.  ______17.  Continue to take the following medications but do not take on the morning of surgery:                       LINZESS / AMITIZA / COLACE / PHENTERMINE  ______18. If staying overnight, please have appropriate shoes to wear to be able to walk around the unit.                   Wear clean and comfortable clothing to the hospital.  CONTINUE TAKING ALL EVENING MEDICATIONS AS USUAL. Please bring spinal cord stimulator remote. Bring POA / LIVING WILL documents so we can make a copy of them.

## 2018-08-31 ENCOUNTER — Ambulatory Visit: Payer: Medicare Other | Admitting: General Surgery

## 2018-08-31 ENCOUNTER — Ambulatory Visit: Payer: Medicare Other

## 2018-08-31 MED ORDER — CEFAZOLIN SODIUM-DEXTROSE 2-4 GM/100ML-% IV SOLN
2.0000 g | INTRAVENOUS | Status: AC
  Administered 2018-09-01: 2 g via INTRAVENOUS

## 2018-09-01 ENCOUNTER — Ambulatory Visit
Admission: RE | Admit: 2018-09-01 | Discharge: 2018-09-01 | Disposition: A | Discharge status: Home / Self Care | Payer: Medicare Other | Attending: General Surgery | Admitting: General Surgery

## 2018-09-01 ENCOUNTER — Encounter
Admission: RE | Disposition: A | Discharge status: Home / Self Care | Payer: Self-pay | Source: Home / Self Care | Attending: General Surgery

## 2018-09-01 ENCOUNTER — Ambulatory Visit: Payer: Medicare Other | Admitting: Anesthesiology

## 2018-09-01 ENCOUNTER — Other Ambulatory Visit: Payer: Self-pay

## 2018-09-01 DIAGNOSIS — F419 Anxiety disorder, unspecified: Secondary | ICD-10-CM | POA: Insufficient documentation

## 2018-09-01 DIAGNOSIS — K219 Gastro-esophageal reflux disease without esophagitis: Secondary | ICD-10-CM | POA: Diagnosis not present

## 2018-09-01 DIAGNOSIS — I10 Essential (primary) hypertension: Secondary | ICD-10-CM | POA: Insufficient documentation

## 2018-09-01 DIAGNOSIS — E039 Hypothyroidism, unspecified: Secondary | ICD-10-CM | POA: Insufficient documentation

## 2018-09-01 DIAGNOSIS — K439 Ventral hernia without obstruction or gangrene: Secondary | ICD-10-CM | POA: Diagnosis not present

## 2018-09-01 DIAGNOSIS — K760 Fatty (change of) liver, not elsewhere classified: Secondary | ICD-10-CM | POA: Diagnosis not present

## 2018-09-01 DIAGNOSIS — G2581 Restless legs syndrome: Secondary | ICD-10-CM | POA: Insufficient documentation

## 2018-09-01 DIAGNOSIS — Z981 Arthrodesis status: Secondary | ICD-10-CM | POA: Diagnosis not present

## 2018-09-01 DIAGNOSIS — M5136 Other intervertebral disc degeneration, lumbar region: Secondary | ICD-10-CM | POA: Diagnosis not present

## 2018-09-01 DIAGNOSIS — Z79899 Other long term (current) drug therapy: Secondary | ICD-10-CM | POA: Diagnosis not present

## 2018-09-01 DIAGNOSIS — Z79890 Hormone replacement therapy (postmenopausal): Secondary | ICD-10-CM | POA: Insufficient documentation

## 2018-09-01 DIAGNOSIS — E7800 Pure hypercholesterolemia, unspecified: Secondary | ICD-10-CM | POA: Diagnosis not present

## 2018-09-01 DIAGNOSIS — Z7982 Long term (current) use of aspirin: Secondary | ICD-10-CM | POA: Insufficient documentation

## 2018-09-01 DIAGNOSIS — Z7951 Long term (current) use of inhaled steroids: Secondary | ICD-10-CM | POA: Insufficient documentation

## 2018-09-01 DIAGNOSIS — K5900 Constipation, unspecified: Secondary | ICD-10-CM | POA: Insufficient documentation

## 2018-09-01 DIAGNOSIS — F329 Major depressive disorder, single episode, unspecified: Secondary | ICD-10-CM | POA: Insufficient documentation

## 2018-09-01 DIAGNOSIS — Z791 Long term (current) use of non-steroidal anti-inflammatories (NSAID): Secondary | ICD-10-CM | POA: Insufficient documentation

## 2018-09-01 HISTORY — PX: VENTRAL HERNIA REPAIR: SHX424

## 2018-09-01 SURGERY — REPAIR, HERNIA, VENTRAL
Anesthesia: General

## 2018-09-01 MED ORDER — DEXAMETHASONE SODIUM PHOSPHATE 10 MG/ML IJ SOLN
INTRAMUSCULAR | Status: DC | PRN
  Administered 2018-09-01: 10 mg via INTRAVENOUS

## 2018-09-01 MED ORDER — BUPIVACAINE-EPINEPHRINE (PF) 0.5% -1:200000 IJ SOLN
INTRAMUSCULAR | Status: DC | PRN
  Administered 2018-09-01: 30 mL

## 2018-09-01 MED ORDER — BUPIVACAINE HCL (PF) 0.5 % IJ SOLN
INTRAMUSCULAR | Status: AC
  Filled 2018-09-01: qty 30

## 2018-09-01 MED ORDER — ACETAMINOPHEN 160 MG/5ML PO SOLN
325.0000 mg | ORAL | Status: DC | PRN
  Filled 2018-09-01: qty 20.3

## 2018-09-01 MED ORDER — FENTANYL CITRATE (PF) 100 MCG/2ML IJ SOLN
INTRAMUSCULAR | Status: DC | PRN
  Administered 2018-09-01 (×3): 50 ug via INTRAVENOUS

## 2018-09-01 MED ORDER — SUGAMMADEX SODIUM 200 MG/2ML IV SOLN
INTRAVENOUS | Status: DC | PRN
  Administered 2018-09-01: 154.8 mg via INTRAVENOUS

## 2018-09-01 MED ORDER — EPINEPHRINE PF 1 MG/ML IJ SOLN
INTRAMUSCULAR | Status: AC
  Filled 2018-09-01: qty 1

## 2018-09-01 MED ORDER — FENTANYL CITRATE (PF) 100 MCG/2ML IJ SOLN
INTRAMUSCULAR | Status: AC
  Filled 2018-09-01: qty 2

## 2018-09-01 MED ORDER — OXYCODONE HCL 5 MG PO TABS
10.0000 mg | ORAL_TABLET | Freq: Once | ORAL | Status: AC
  Administered 2018-09-01: 10 mg via ORAL

## 2018-09-01 MED ORDER — MIDAZOLAM HCL 2 MG/2ML IJ SOLN
INTRAMUSCULAR | Status: DC | PRN
  Administered 2018-09-01: 2 mg via INTRAVENOUS

## 2018-09-01 MED ORDER — PROPOFOL 10 MG/ML IV BOLUS
INTRAVENOUS | Status: AC
  Filled 2018-09-01: qty 20

## 2018-09-01 MED ORDER — PROMETHAZINE HCL 25 MG/ML IJ SOLN: 6.2500 mg | INTRAMUSCULAR | Status: DC | PRN

## 2018-09-01 MED ORDER — SUCCINYLCHOLINE CHLORIDE 20 MG/ML IJ SOLN
INTRAMUSCULAR | Status: DC | PRN
  Administered 2018-09-01: 100 mg via INTRAVENOUS

## 2018-09-01 MED ORDER — LIDOCAINE HCL (CARDIAC) PF 100 MG/5ML IV SOSY
PREFILLED_SYRINGE | INTRAVENOUS | Status: DC | PRN
  Administered 2018-09-01: 100 mg via INTRAVENOUS

## 2018-09-01 MED ORDER — FENTANYL CITRATE (PF) 100 MCG/2ML IJ SOLN
50.0000 ug | Freq: Once | INTRAMUSCULAR | Status: AC
  Administered 2018-09-01: 50 ug via INTRAVENOUS

## 2018-09-01 MED ORDER — SCOPOLAMINE 1 MG/3DAYS TD PT72
MEDICATED_PATCH | TRANSDERMAL | Status: AC
  Administered 2018-09-01: 1.5 mg via TRANSDERMAL
  Filled 2018-09-01: qty 1

## 2018-09-01 MED ORDER — PROPOFOL 10 MG/ML IV BOLUS
INTRAVENOUS | Status: DC | PRN
  Administered 2018-09-01: 170 mg via INTRAVENOUS

## 2018-09-01 MED ORDER — LACTATED RINGERS IV SOLN
INTRAVENOUS | Status: DC
  Administered 2018-09-01: 13:00:00 via INTRAVENOUS

## 2018-09-01 MED ORDER — ACETAMINOPHEN 325 MG PO TABS: 325.0000 mg | ORAL_TABLET | ORAL | Status: DC | PRN

## 2018-09-01 MED ORDER — ROCURONIUM BROMIDE 100 MG/10ML IV SOLN
INTRAVENOUS | Status: DC | PRN
  Administered 2018-09-01: 40 mg via INTRAVENOUS
  Administered 2018-09-01: 10 mg via INTRAVENOUS

## 2018-09-01 MED ORDER — ACETAMINOPHEN 10 MG/ML IV SOLN
INTRAVENOUS | Status: AC
  Filled 2018-09-01: qty 100

## 2018-09-01 MED ORDER — SCOPOLAMINE 1 MG/3DAYS TD PT72
1.0000 | MEDICATED_PATCH | TRANSDERMAL | Status: DC
  Administered 2018-09-01: 1.5 mg via TRANSDERMAL

## 2018-09-01 MED ORDER — ONDANSETRON HCL 4 MG/2ML IJ SOLN
INTRAMUSCULAR | Status: DC | PRN
  Administered 2018-09-01: 4 mg via INTRAVENOUS

## 2018-09-01 MED ORDER — KETOROLAC TROMETHAMINE 30 MG/ML IJ SOLN
INTRAMUSCULAR | Status: DC | PRN
  Administered 2018-09-01: 30 mg via INTRAVENOUS

## 2018-09-01 MED ORDER — FENTANYL CITRATE (PF) 100 MCG/2ML IJ SOLN
25.0000 ug | INTRAMUSCULAR | Status: DC | PRN
  Administered 2018-09-01 (×3): 50 ug via INTRAVENOUS

## 2018-09-01 MED ORDER — MIDAZOLAM HCL 2 MG/2ML IJ SOLN
INTRAMUSCULAR | Status: AC
  Filled 2018-09-01: qty 2

## 2018-09-01 MED ORDER — MEPERIDINE HCL 50 MG/ML IJ SOLN: 6.2500 mg | INTRAMUSCULAR | Status: DC | PRN

## 2018-09-01 MED ORDER — ACETAMINOPHEN 10 MG/ML IV SOLN
INTRAVENOUS | Status: DC | PRN
  Administered 2018-09-01: 1000 mg via INTRAVENOUS

## 2018-09-01 MED ORDER — OXYCODONE HCL 5 MG PO TABS
ORAL_TABLET | ORAL | Status: AC
  Filled 2018-09-01: qty 2

## 2018-09-01 MED ORDER — OXYCODONE HCL 5 MG PO TABS: 5.0000 mg | ORAL_TABLET | ORAL | 0 refills | Status: DC | PRN

## 2018-09-01 SURGICAL SUPPLY — 40 items
APL PRP STRL LF DISP 70% ISPRP (MISCELLANEOUS) ×1
BLADE SURG 15 STRL SS SAFETY (BLADE) ×2 IMPLANT
CANISTER SUCT 1200ML W/VALVE (MISCELLANEOUS) ×2 IMPLANT
CHLORAPREP W/TINT 26 (MISCELLANEOUS) ×2 IMPLANT
COVER WAND RF STERILE (DRAPES) ×1 IMPLANT
DRAIN CHANNEL JP 15F RND 16 (MISCELLANEOUS) ×2 IMPLANT
DRAPE CHEST BREAST 77X106 FENE (MISCELLANEOUS) ×1 IMPLANT
DRAPE LAPAROTOMY 100X77 ABD (DRAPES) ×2 IMPLANT
DRSG TEGADERM 4X4.75 (GAUZE/BANDAGES/DRESSINGS) ×4 IMPLANT
DRSG TELFA 3X8 NADH (GAUZE/BANDAGES/DRESSINGS) ×2 IMPLANT
ELECT REM PT RETURN 9FT ADLT (ELECTROSURGICAL) ×2
ELECTRODE REM PT RTRN 9FT ADLT (ELECTROSURGICAL) ×1 IMPLANT
GAUZE SPONGE 4X4 12PLY STRL (GAUZE/BANDAGES/DRESSINGS) ×2 IMPLANT
GLOVE BIO SURGEON STRL SZ7.5 (GLOVE) ×3 IMPLANT
GLOVE INDICATOR 8.0 STRL GRN (GLOVE) ×3 IMPLANT
GOWN STRL REUS W/ TWL LRG LVL3 (GOWN DISPOSABLE) ×2 IMPLANT
GOWN STRL REUS W/TWL LRG LVL3 (GOWN DISPOSABLE) ×4
KIT TURNOVER KIT A (KITS) ×2 IMPLANT
LABEL OR SOLS (LABEL) ×2 IMPLANT
MESH VENTRALEX ST 2.5 CRC MED (Mesh General) ×1 IMPLANT
NDL HYPO 25X1 1.5 SAFETY (NEEDLE) ×1 IMPLANT
NEEDLE HYPO 22GX1.5 SAFETY (NEEDLE) ×2 IMPLANT
NEEDLE HYPO 25X1 1.5 SAFETY (NEEDLE) ×2 IMPLANT
NS IRRIG 500ML POUR BTL (IV SOLUTION) ×2 IMPLANT
PACK BASIN MINOR ARMC (MISCELLANEOUS) ×2 IMPLANT
PAD DRESSING TELFA 3X8 NADH (GAUZE/BANDAGES/DRESSINGS) ×1 IMPLANT
RETRACTOR RING XSMALL (MISCELLANEOUS) IMPLANT
RTRCTR WOUND ALEXIS 13CM XS SH (MISCELLANEOUS) ×2
SPONGE LAP 18X18 RF (DISPOSABLE) ×1 IMPLANT
STAPLER SKIN PROX 35W (STAPLE) ×1 IMPLANT
STRIP CLOSURE SKIN 1/2X4 (GAUZE/BANDAGES/DRESSINGS) ×2 IMPLANT
SUT SURGILON 0 BLK (SUTURE) ×4 IMPLANT
SUT VIC AB 2-0 BRD 54 (SUTURE) ×2 IMPLANT
SUT VIC AB 3-0 SH 27 (SUTURE) ×2
SUT VIC AB 3-0 SH 27X BRD (SUTURE) ×1 IMPLANT
SUT VIC AB 4-0 FS2 27 (SUTURE) ×2 IMPLANT
SUT VICRYL+ 3-0 144IN (SUTURE) ×2 IMPLANT
SYR 10ML LL (SYRINGE) ×2 IMPLANT
SYR 3ML LL SCALE MARK (SYRINGE) ×2 IMPLANT
TRAY FOLEY MTR SLVR 16FR STAT (SET/KITS/TRAYS/PACK) ×1 IMPLANT

## 2018-09-01 NOTE — Anesthesia Post-op Follow-up Note (Signed)
Anesthesia QCDR form completed.        

## 2018-09-01 NOTE — Op Note (Signed)
Preoperative diagnosis: Ventral hernia, symptomatic.  Postoperative diagnosis: Same.  Operative procedure: Repair of ventral hernia with 6.4 cm ventral light mesh.  Operating Surgeon: Hervey Ard, MD.  Anesthesia: General endotracheal, Marcaine 0.5% with 1 to 200,000 units of epinephrine, 30 cc.  Estimated blood loss: 2 cc.  Clinical note: This 63 year old woman is developed a ventral hernia.  She has been symptomatic with pain.  Prior CT showed small bowel and omentum  Presently not obstructed.  Admitted for elective repair.  The patient received Ancef prior to the procedure.  SCD stockings for DVT prevention.  Operative note: With the patient under adequate general endotracheal anesthesia the abdomen was cleansed with ChloraPrep and draped.  The site of the fascial defect with had been marked in the preop holding area.  A 6 cm incision was made excising a portion of the scar above the level of the umbilicus from her previous Hassan cannula placement.  The skin was incised sharply and remaining dissection was completed with electrocautery.  The hernia sac was identified and transected at the fascial layer.  The defect was vertically orientated measuring about 2 cm in length and 4 cm in width.  The undersurface of the abdominal wall was clear.  A 6.4 cm ventral light mesh was placed in intraperitoneal position and snug to the anterior abdominal wall.  4 corner transfixion sutures through the fascia were placed under direct vision at the 12, 3, 6 and 9 o'clock position.  The midline fascia was then approximated with interrupted 0 Surgilon sutures grasping the mesh with each passage.  These were placed sequentially and then tied.  The adipose layer was approximated in 2 layers with running 3-0 Vicryl suture.  The skin was closed with a running 4-0 Vicryl subcuticular suture.  Benzoin and Steri-Strips followed by Telfa and Tegaderm dressing applied.  The patient tolerated the procedure well and was  taken to the recovery room in stable condition.

## 2018-09-01 NOTE — H&P (Signed)
No change in clinical history or exam.  For ventral hernia repair.

## 2018-09-01 NOTE — Anesthesia Procedure Notes (Signed)
Procedure Name: Intubation Date/Time: 09/01/2018 3:03 PM Performed by: Nelda Marseille, CRNA Pre-anesthesia Checklist: Patient identified, Patient being monitored, Timeout performed, Emergency Drugs available and Suction available Patient Re-evaluated:Patient Re-evaluated prior to induction Oxygen Delivery Method: Circle system utilized Preoxygenation: Pre-oxygenation with 100% oxygen Induction Type: IV induction Ventilation: Mask ventilation without difficulty Laryngoscope Size: Mac, 3 and McGraph Grade View: Grade II Tube type: Oral Tube size: 7.0 mm Number of attempts: 1 Airway Equipment and Method: Stylet and Video-laryngoscopy Placement Confirmation: ETT inserted through vocal cords under direct vision,  positive ETCO2 and breath sounds checked- equal and bilateral Secured at: 21 cm Tube secured with: Tape Dental Injury: Teeth and Oropharynx as per pre-operative assessment  Difficulty Due To: Difficulty was anticipated

## 2018-09-01 NOTE — Anesthesia Preprocedure Evaluation (Signed)
Anesthesia Evaluation  Patient identified by MRN, date of birth, ID band Patient awake    Reviewed: Allergy & Precautions, H&P , NPO status , reviewed documented beta blocker date and time   History of Anesthesia Complications (+) PONV, Family history of anesthesia reaction and history of anesthetic complications  Airway Mallampati: II  TM Distance: >3 FB Neck ROM: full    Dental  (+) Upper Dentures, Implants, Partial Lower, Missing   Pulmonary    Pulmonary exam normal        Cardiovascular hypertension, Normal cardiovascular exam     Neuro/Psych  Headaches, PSYCHIATRIC DISORDERS Anxiety Depression  Neuromuscular disease    GI/Hepatic GERD  Medicated and Controlled,  Endo/Other  Hypothyroidism   Renal/GU      Musculoskeletal  (+) Arthritis ,   Abdominal   Peds  Hematology   Anesthesia Other Findings Past Medical History: No date: Anxiety No date: Back pain No date: Colon polyp No date: Constipation No date: Degenerative disc disease, lumbar No date: Depression No date: Family history of adverse reaction to anesthesia     Comment:  daughter nausea No date: Fatty liver No date: GERD (gastroesophageal reflux disease) No date: H/O spinal fusion No date: Headache     Comment:  migraines 05/08/2015: Headache, migraine No date: High cholesterol No date: Hypertension No date: Lower back pain No date: Polycystic ovarian disease No date: PONV (postoperative nausea and vomiting)     Comment:  due to surgery from the 1990's No date: Restless leg syndrome 01/29/2009: Spasm Past Surgical History:  : ABDOMINAL HYSTERECTOMY     Comment:  [place]: APPENDECTOMY 12/14/2014: ARTHRODESIS METATARSALPHALANGEAL JOINT (MTPJ); Left     Comment:  Procedure: ARTHRODESIS METATARSALPHALANGEAL JOINT (MTPJ)              1ST;  Surgeon: Albertine Patricia, DPM;  Location: [facility];  Service: Podiatry;   Laterality: Left;  LMA 2004: back fusion     Comment:  lumbar region. had broken facet joint.  [phone]: BACK SURGERY     Comment:  x3 2006: BLADDER SUSPENSION 1990's: BREAST BIOPSY; Left     Comment:  core Sankar 2005: BREAST SURGERY     Comment:  lumpectomy 02/26/07,05/21/07: CARPAL TUNNEL RELEASE; Bilateral 05/03/2018: CHOLECYSTECTOMY; N/A     Comment:  Procedure: LAPAROSCOPIC CHOLECYSTECTOMY WITH               INTRAOPERATIVE CHOLANGIOGRAM;  Surgeon: Robert Bellow, MD;  Location: [facility];  Service: General;                Laterality: N/A; No date: COLONOSCOPY No date: COLPORRHAPHY No date: DILATION AND CURETTAGE OF UTERUS 1982: ECTOPIC PREGNANCY SURGERY 2017: FOOT SURGERY; Left     Comment:  fusion on big toe, hammer toe, bunions 03/22/2015: HAMMER TOE SURGERY; Left     Comment:  Procedure: HAMMER TOE CORRECTION L 2ND AND 3RD ;                Surgeon: Albertine Patricia, DPM;  Location: [facility];  Service: Podiatry;  Laterality: Left;  WITH LOCAL 1988: HERNIA REPAIR; Right     Comment:  inguinal 1993, 1994: LAMINECTOMY 2006: NECK SURGERY     Comment:  fusion 08/25/2016: [facility];  N/A     Comment:  Procedure: POSTERIOR REPAIR (RECTOCELE);  Surgeon:               Brayton Mars, MD;  Location: [facility];  Service:               Gynecology;  Laterality: N/A; 2014: SPINAL CORD STIMULATOR IMPLANT     Comment:  stimulator in the ileo inguinal nerve due to scar tissue              in abdomen 1985: TONSILLECTOMY   Reproductive/Obstetrics                             Anesthesia Physical Anesthesia Plan  ASA: II  Anesthesia Plan: General   Post-op Pain Management:    Induction: Intravenous  PONV Risk Score and Plan: Ondansetron and Treatment may vary due to age or medical condition  Airway Management Planned: LMA  Additional Equipment:   Intra-op Plan:   Post-operative Plan: Extubation  in OR  Informed Consent: I have reviewed the patients History and Physical, chart, labs and discussed the procedure including the risks, benefits and alternatives for the proposed anesthesia with the patient or authorized representative who has indicated his/her understanding and acceptance.     Dental Advisory Given  Plan Discussed with: CRNA  Anesthesia Plan Comments:         Anesthesia Quick Evaluation

## 2018-09-01 NOTE — Anesthesia Postprocedure Evaluation (Signed)
Anesthesia Post Note  Patient: Courtney Ross  Procedure(s) Performed: HERNIA REPAIR VENTRAL ADULT (N/A )  Patient location during evaluation: PACU Anesthesia Type: General Level of consciousness: awake and alert Pain management: pain level controlled Vital Signs Assessment: post-procedure vital signs reviewed and stable Respiratory status: spontaneous breathing, nonlabored ventilation, respiratory function stable and patient connected to nasal cannula oxygen Cardiovascular status: blood pressure returned to baseline and stable Postop Assessment: no apparent nausea or vomiting Anesthetic complications: no     Last Vitals:  Vitals:   09/01/18 1637 09/01/18 1708  BP: (!) 145/74 122/77  Pulse: 85   Resp: 18 18  Temp: 36.4 C   SpO2: 98% 99%    Last Pain:  Vitals:   09/01/18 1708  TempSrc:   PainSc: 4                  Martha Clan

## 2018-09-01 NOTE — Transfer of Care (Signed)
Immediate Anesthesia Transfer of Care Note  Patient: Courtney Ross  Procedure(s) Performed: HERNIA REPAIR VENTRAL ADULT (N/A )  Patient Location: PACU  Anesthesia Type:General  Level of Consciousness: sedated  Airway & Oxygen Therapy: Patient Spontanous Breathing and Patient connected to face mask oxygen  Post-op Assessment: Report given to RN and Post -op Vital signs reviewed and stable  Post vital signs: Reviewed and stable  Last Vitals:  Vitals Value Taken Time  BP 136/70 09/01/2018  3:40 PM  Temp    Pulse 86 09/01/2018  3:41 PM  Resp 21 09/01/2018  3:41 PM  SpO2 100 % 09/01/2018  3:41 PM  Vitals shown include unvalidated device data.  Last Pain:  Vitals:   09/01/18 1245  TempSrc: Tympanic  PainSc: 0-No pain         Complications: No apparent anesthesia complications

## 2018-09-02 ENCOUNTER — Ambulatory Visit: Payer: Medicare Other

## 2018-09-02 ENCOUNTER — Encounter: Payer: Self-pay | Admitting: General Surgery

## 2018-09-07 ENCOUNTER — Ambulatory Visit: Payer: Medicare Other

## 2018-09-09 ENCOUNTER — Ambulatory Visit: Payer: Medicare Other

## 2018-09-13 ENCOUNTER — Telehealth: Payer: Self-pay | Admitting: Physical Therapy

## 2018-09-13 NOTE — Telephone Encounter (Signed)
Called patient to update her that the clinic is temporarily closing due to COVID-19 precautions. No answer, left message. Let her know I was calling to check on her and let her know that we are closed but will be checking messages and are available for questions and discussion by phone if she needs [place]. Gave call back number [phone]. Also let her know we are looking into telehealth and would like to know if she is interested in that.

## 2018-09-14 ENCOUNTER — Ambulatory Visit: Payer: Medicare Other

## 2018-09-14 ENCOUNTER — Telehealth: Payer: Self-pay

## 2018-09-14 NOTE — Telephone Encounter (Signed)
Maudie Mercury w/ [facility] called with patient on the phone and stated that patient would like all of her medication switched to 90 day supply. Please advise. Pharmacy in chart is correct.

## 2018-09-16 ENCOUNTER — Encounter: Payer: Medicare Other | Admitting: General Surgery

## 2018-09-16 ENCOUNTER — Ambulatory Visit: Payer: Medicare Other

## 2018-09-16 ENCOUNTER — Other Ambulatory Visit: Payer: Self-pay

## 2018-09-16 ENCOUNTER — Encounter: Payer: Self-pay | Admitting: General Surgery

## 2018-09-16 ENCOUNTER — Ambulatory Visit (INDEPENDENT_AMBULATORY_CARE_PROVIDER_SITE_OTHER): Payer: Medicare Other | Admitting: General Surgery

## 2018-09-16 VITALS — BP 154/91 | HR 108 | Temp 97.9°F | Resp 14 | Ht 68.0 in

## 2018-09-16 DIAGNOSIS — K439 Ventral hernia without obstruction or gangrene: Secondary | ICD-10-CM

## 2018-09-16 NOTE — Patient Instructions (Addendum)
The patient is aware to call back for any questions or new concerns.  Colonoscopy on hold

## 2018-09-16 NOTE — Progress Notes (Signed)
Patient ID: Courtney Ross, female   DOB: Nov 13, 1955, 63 y.o.   MRN: [id number]  Chief Complaint  Patient presents with  . Routine Post Op    post op HERNIA REPAIR VENTRAL ADULT 09/01/2018    HPI Courtney Ross is a 63 y.o. female.  Here for postoperative visit, ventral hernia repair 09-01-18. She states she is doing well, still using a supplemental oxycodone 5 mg, usually in the evening. No difficulty with bowel or bladder function. HPI  Past Medical History:  Diagnosis Date  . Anxiety   . Back pain   . Colon polyp   . Constipation   . Degenerative disc disease, lumbar   . Depression   . Family history of adverse reaction to anesthesia    daughter nausea  . Fatty liver   . GERD (gastroesophageal reflux disease)   . H/O spinal fusion   . Headache    migraines  . Headache, migraine 05/08/2015  . High cholesterol   . Hypertension   . Lower back pain   . Polycystic ovarian disease   . PONV (postoperative nausea and vomiting)    due to surgery from the 1990's  . Restless leg syndrome   . Spasm 01/29/2009    Past Surgical History:  Procedure Laterality Date  . [place]  . APPENDECTOMY  1988  . ARTHRODESIS METATARSALPHALANGEAL JOINT (MTPJ) Left 12/14/2014   Procedure: ARTHRODESIS METATARSALPHALANGEAL JOINT (MTPJ) 1ST;  Surgeon: Albertine Patricia, DPM;  Location: [facility];  Service: Podiatry;  Laterality: Left;  LMA  . back fusion  2004   lumbar region. had broken facet joint.   Marland Kitchen BACK SURGERY  [phone]   x3  . BLADDER SUSPENSION  2006  . BREAST BIOPSY Left 1990's   core S[place]  . BREAST SURGERY  2005   lumpectomy  . CARPAL TUNNEL RELEASE Bilateral 02/26/07,05/21/07  . CHOLECYSTECTOMY N/A 05/03/2018   Procedure: LAPAROSCOPIC CHOLECYSTECTOMY WITH INTRAOPERATIVE CHOLANGIOGRAM;  Surgeon: Robert Bellow, MD;  Location: [facility];  Service: General;  Laterality: N/A;  . COLONOSCOPY    . COLPORRHAPHY    . DILATION AND CURETTAGE OF  UTERUS    . [place]  . FOOT SURGERY Left 2017   fusion on big toe, hammer toe, bunions  . HAMMER TOE SURGERY Left 03/22/2015   Procedure: HAMMER TOE CORRECTION L 2ND AND 3RD ;  Surgeon: Albertine Patricia, DPM;  Location: [facility];  Service: Podiatry;  Laterality: Left;  WITH LOCAL  . HERNIA REPAIR Right 1988   inguinal  . LAMINECTOMY  1993, 1994  . NECK SURGERY  2006   fusion  . RECTOCELE REPAIR N/A 08/25/2016   Procedure: POSTERIOR REPAIR (RECTOCELE);  Surgeon: Brayton Mars, MD;  Location: [facility];  Service: Gynecology;  Laterality: N/A;  . SPINAL CORD STIMULATOR IMPLANT  2014   stimulator in the ileo inguinal nerve due to scar tissue in abdomen  . TONSILLECTOMY  1985  . VENTRAL HERNIA REPAIR N/A 09/01/2018   6.4 cm Ventra lite mesh. HERNIA REPAIR VENTRAL ADULT;  Surgeon: Robert Bellow, MD;  Location: [facility];  Service: General;  Laterality: N/A;    Family History  Problem Relation Age of Onset  . Diabetes Father   . Hypertension Father   . CAD Father   . Diabetes Sister        non-Insulin dependent diabetes mellitus  . Heart disease Brother   . Cancer Daughter  cancer of the cervix at age 34-18  . Heart disease Brother   . Breast cancer Paternal Aunt   . Colon cancer Paternal Uncle   . Ovarian cancer Neg Hx     Social History Social History   Tobacco Use  . Smoking status: Never Smoker  . Smokeless tobacco: Never Used  Substance Use Topics  . Alcohol use: No    Alcohol/week: 0.0 standard drinks    Frequency: Never  . Drug use: No    Allergies  Allergen Reactions  . Hydrocodone Itching    Even in cough syrup causes all body itching  . Lisinopril Cough  . Mirtazapine     Weight gain  . Other Nausea And Vomiting    general anesthesia from 1990    Current Outpatient Medications  Medication Sig Dispense Refill  . AMITIZA 24 MCG capsule TAKE 1 CAPSULE BY MOUTH TWICE DAILY WITHA MEAL (Patient taking  differently: Take 24 mcg by mouth 2 (two) times daily with a meal. ) 60 capsule 4  . aspirin EC 81 MG tablet Take 81 mg by mouth at bedtime.    Marland Kitchen buPROPion (WELLBUTRIN SR) 150 MG 12 hr tablet Take 1 tablet (150 mg total) by mouth 3 (three) times daily. 90 tablet 3  . Calcium Carbonate (CALCI-CHEW PO) Take 2 tablets by mouth daily.    . Cholecalciferol (VITAMIN D3) 5000 UNITS CAPS Take 5,000 Units by mouth daily.     . cyclobenzaprine (FLEXERIL) 10 MG tablet Take 10 mg by mouth 3 (three) times daily as needed for muscle spasms.    . diazepam (VALIUM) 10 MG tablet TAKE 1 TABLET BY MOUTH EVERY 6 HOURS AS NEEDED FOR ANXIETY (Patient taking differently: Take 10 mg by mouth every 6 (six) hours as needed for anxiety. ) 120 tablet 5  . docusate sodium (COLACE) 100 MG capsule Take 100 mg by mouth daily as needed for moderate constipation.     Marland Kitchen estradiol (ESTRACE) 1 MG tablet Take 1 tablet (1 mg total) by mouth daily. 90 tablet 1  . fluticasone (FLONASE) 50 MCG/ACT nasal spray Place 1 spray into both nostrils daily as needed for allergies or rhinitis.    . hydroquinone 4 % cream Apply 1 application topically daily as needed (dark spots).   1  . LINZESS 145 MCG CAPS capsule Take 145 mcg by mouth daily before breakfast.     . meloxicam (MOBIC) 15 MG tablet Take 15 mg by mouth at bedtime.     . Multiple Vitamin (MULTIVITAMIN WITH MINERALS) TABS tablet Take 1 tablet by mouth daily.    . mupirocin cream (BACTROBAN) 2 % Apply to affected aread twice daily as needed. (Patient taking differently: Apply 1 application topically 2 (two) times daily as needed (irritation). Apply to affected aread twice daily as needed.) 15 g 2  . omeprazole (PRILOSEC) 40 MG capsule Take 1 capsule (40 mg total) by mouth daily. 30 capsule 1  . oxyCODONE (ROXICODONE) 15 MG immediate release tablet Take 15 mg by mouth every 4 (four) hours as needed for pain.    Marland Kitchen oxyCODONE (ROXICODONE) 5 MG immediate release tablet Take 1 tablet (5 mg  total) by mouth every 4 (four) hours as needed for severe pain. 30 tablet 0  . phentermine 15 MG capsule Take 1 capsule (15 mg total) by mouth every morning. (Patient taking differently: Take 15 mg by mouth 3 (three) times a week. ) 30 capsule 2  . polyethylene glycol (MIRALAX / GLYCOLAX) packet Take  17 g by mouth 2 (two) times daily as needed for mild constipation (dissolve in an 8 oz glass of water). 14 each 1  . pregabalin (LYRICA) 75 MG capsule TAKE 1 CAPSULE BY MOUTH 3 TIMES DAILY (Patient taking differently: Take 75 mg by mouth 3 (three) times daily. ) 90 capsule 4  . RESTASIS 0.05 % ophthalmic emulsion Place 1 drop into both eyes 2 (two) times daily.     Marland Kitchen rOPINIRole (REQUIP) 1 MG tablet TAKE 1 TABLET AT BEDTIME (Patient taking differently: Take 1 mg by mouth at bedtime. ) 30 tablet 11  . rosuvastatin (CRESTOR) 10 MG tablet Take 1 tablet (10 mg total) by mouth daily. 30 tablet 3   No current facility-administered medications for this visit.     Review of Systems Review of Systems  Constitutional: Negative.   Respiratory: Negative.   Cardiovascular: Negative.     Blood pressure (!) 154/91, pulse (!) 108, temperature 97.9 F (36.6 C), temperature source Temporal, resp. rate 14, height 5\' 8"  (1.727 m), SpO2 99 %.  Physical Exam Physical Exam Constitutional:      Appearance: Normal appearance.  Abdominal:    Skin:    General: Skin is warm and dry.  Neurological:     Mental Status: She is alert and oriented to person, place, and time.  Psychiatric:        Mood and Affect: Mood normal.        Behavior: Behavior normal.      Assessment Doing well post ventral hernia repair.  Plan Proper lifting technique reviewed.  She will avoid core muscle exercises at this time.  Lower extremity exercises, one leg at a time permitted.  Follow up as needed Colonoscopy pending  The patient is aware to call back for any questions or new concerns.   HPI, assessment, plan and  physical exam has been scribed under the direction and in the presence of Robert Bellow, MD. Karie Fetch, RN  I have completed the exam and reviewed the above documentation for accuracy and completeness.  I agree with the above.  Haematologist has been used and any errors in dictation or transcription are unintentional.  Hervey Ard, M.D., F.A.C.S.   Courtney Ross 09/16/2018, 10:04 AM

## 2018-09-20 ENCOUNTER — Ambulatory Visit: Payer: Medicare Other | Admitting: Physical Therapy

## 2018-09-22 ENCOUNTER — Other Ambulatory Visit: Payer: Self-pay | Admitting: *Deleted

## 2018-09-22 MED ORDER — OXYCODONE HCL 15 MG PO TABS: 15.0000 mg | ORAL_TABLET | ORAL | 0 refills | Status: DC | PRN

## 2018-09-30 ENCOUNTER — Encounter: Payer: Self-pay | Admitting: Physical Therapy

## 2018-09-30 NOTE — Therapy (Signed)
[place] MAIN [facility] [address] [place], [place], [address] Phone: [phone]   Fax:  [phone]  Patient Details  Name: Courtney Ross MRN: [id number] Date of Birth: 29-Jul-1955 Referring Provider:  No ref. provider found  Encounter Date: 09/30/2018  The [facility] outpatient clinics are closed at this time due to the COVID-19 epidemic. Called pt to discuss options for continued care. Patient may benefit from telehealth or home health depending on pt preference. No answer. Left voicemail requesting call back at [phone]   Everlean Alstrom. Graylon Good, PT, DPT 09/30/18, 3:50 PM   [place] MAIN [facility] [address] [place], [place], [address] Phone: [phone]   Fax:  [phone]

## 2018-10-12 ENCOUNTER — Other Ambulatory Visit: Payer: Self-pay

## 2018-10-12 ENCOUNTER — Other Ambulatory Visit: Payer: Self-pay | Admitting: Family Medicine

## 2018-10-12 DIAGNOSIS — F324 Major depressive disorder, single episode, in partial remission: Secondary | ICD-10-CM

## 2018-10-12 DIAGNOSIS — E7800 Pure hypercholesterolemia, unspecified: Secondary | ICD-10-CM

## 2018-10-12 NOTE — Telephone Encounter (Signed)
Patient says her insurance requires her to get a 90 day supply on her medications. Please review. Patient uses [facility].

## 2018-10-12 NOTE — Telephone Encounter (Signed)
Patient is requesting a new RX for a 90 day supply be sent to [facility] pharmacy for all medications. CB#[phone]

## 2018-10-14 MED ORDER — DIAZEPAM 10 MG PO TABS: 10.0000 mg | ORAL_TABLET | Freq: Four times a day (QID) | ORAL | 1 refills | Status: DC | PRN

## 2018-10-14 MED ORDER — ESTRADIOL 1 MG PO TABS: 1.0000 mg | ORAL_TABLET | Freq: Every day | ORAL | 4 refills | Status: DC

## 2018-10-14 MED ORDER — ROSUVASTATIN CALCIUM 10 MG PO TABS: 10.0000 mg | ORAL_TABLET | Freq: Every day | ORAL | 4 refills | Status: DC

## 2018-10-14 MED ORDER — ROPINIROLE HCL 1 MG PO TABS: 1.0000 mg | ORAL_TABLET | Freq: Every day | ORAL | 4 refills | Status: DC

## 2018-10-14 MED ORDER — BUPROPION HCL ER (SR) 150 MG PO TB12: 150.0000 mg | ORAL_TABLET | Freq: Three times a day (TID) | ORAL | 3 refills | Status: DC

## 2018-10-14 NOTE — Telephone Encounter (Signed)
Please review

## 2018-10-14 NOTE — Telephone Encounter (Signed)
I don't prescribe Restasis, that has to be prescribed by ophthalmologist. Also, Linzess and Amitiza are the same thing, she is supposed to be taking one of the other, no both. Which one is she taking?

## 2018-10-15 ENCOUNTER — Ambulatory Visit: Payer: Self-pay | Admitting: Family Medicine

## 2018-10-19 ENCOUNTER — Other Ambulatory Visit: Payer: Self-pay

## 2018-10-19 ENCOUNTER — Other Ambulatory Visit: Payer: Medicare Other

## 2018-10-19 MED ORDER — OXYCODONE HCL 15 MG PO TABS: 15.0000 mg | ORAL_TABLET | ORAL | 0 refills | Status: DC | PRN

## 2018-10-19 NOTE — Telephone Encounter (Signed)
Patient is requesting a refill on Oxycodone and Flexeril. You have not prescribed the Flexeril before. She states the previous physician who prescribed the medication retired.

## 2018-10-26 ENCOUNTER — Encounter: Payer: Self-pay | Admitting: Family Medicine

## 2018-10-26 MED ORDER — CYCLOBENZAPRINE HCL 10 MG PO TABS: 10.0000 mg | ORAL_TABLET | Freq: Three times a day (TID) | ORAL | 5 refills | Status: DC | PRN

## 2018-11-10 ENCOUNTER — Other Ambulatory Visit: Payer: Self-pay | Admitting: Obstetrics and Gynecology

## 2018-11-19 ENCOUNTER — Other Ambulatory Visit: Payer: Self-pay

## 2018-11-19 MED ORDER — OXYCODONE HCL 15 MG PO TABS: 15.0000 mg | ORAL_TABLET | ORAL | 0 refills | Status: DC | PRN

## 2018-11-19 NOTE — Telephone Encounter (Signed)
Pt called requesting a refill on her oxycodone 15 mg.  [facility].  Call back # [phone].

## 2018-11-26 ENCOUNTER — Encounter: Payer: Self-pay | Admitting: Family Medicine

## 2018-12-07 ENCOUNTER — Other Ambulatory Visit: Payer: Self-pay | Admitting: Family Medicine

## 2018-12-20 ENCOUNTER — Other Ambulatory Visit: Payer: Self-pay

## 2018-12-21 MED ORDER — OXYCODONE HCL 15 MG PO TABS: 15.0000 mg | ORAL_TABLET | ORAL | 0 refills | Status: DC | PRN

## 2018-12-28 DIAGNOSIS — Z981 Arthrodesis status: Secondary | ICD-10-CM | POA: Diagnosis not present

## 2018-12-28 DIAGNOSIS — M419 Scoliosis, unspecified: Secondary | ICD-10-CM | POA: Diagnosis not present

## 2018-12-28 DIAGNOSIS — M5124 Other intervertebral disc displacement, thoracic region: Secondary | ICD-10-CM | POA: Diagnosis not present

## 2018-12-28 DIAGNOSIS — M9951 Intervertebral disc stenosis of neural canal of cervical region: Secondary | ICD-10-CM | POA: Diagnosis not present

## 2018-12-28 DIAGNOSIS — M4125 Other idiopathic scoliosis, thoracolumbar region: Secondary | ICD-10-CM | POA: Diagnosis not present

## 2018-12-28 DIAGNOSIS — M50123 Cervical disc disorder at C6-C7 level with radiculopathy: Secondary | ICD-10-CM | POA: Diagnosis not present

## 2018-12-28 DIAGNOSIS — M9973 Connective tissue and disc stenosis of intervertebral foramina of lumbar region: Secondary | ICD-10-CM | POA: Diagnosis not present

## 2018-12-28 DIAGNOSIS — M2578 Osteophyte, vertebrae: Secondary | ICD-10-CM | POA: Diagnosis not present

## 2018-12-28 DIAGNOSIS — M9971 Connective tissue and disc stenosis of intervertebral foramina of cervical region: Secondary | ICD-10-CM | POA: Diagnosis not present

## 2019-01-03 ENCOUNTER — Ambulatory Visit
Admission: RE | Admit: 2019-01-03 | Discharge: 2019-01-03 | Disposition: A | Discharge status: Home / Self Care | Payer: Medicare Other | Source: Ambulatory Visit | Attending: Family Medicine | Admitting: Family Medicine

## 2019-01-03 ENCOUNTER — Telehealth: Payer: Self-pay | Admitting: Family Medicine

## 2019-01-03 ENCOUNTER — Other Ambulatory Visit: Payer: Self-pay

## 2019-01-03 DIAGNOSIS — M5137 Other intervertebral disc degeneration, lumbosacral region: Secondary | ICD-10-CM | POA: Diagnosis not present

## 2019-01-03 DIAGNOSIS — Z01818 Encounter for other preprocedural examination: Secondary | ICD-10-CM | POA: Insufficient documentation

## 2019-01-03 DIAGNOSIS — Z13820 Encounter for screening for osteoporosis: Secondary | ICD-10-CM | POA: Diagnosis not present

## 2019-01-03 DIAGNOSIS — Z780 Asymptomatic menopausal state: Secondary | ICD-10-CM | POA: Diagnosis not present

## 2019-01-03 NOTE — Chronic Care Management (AMB) (Signed)
Chronic Care Management   Note  01/03/2019 Name: Courtney Ross MRN: [id number] DOB: Apr 07, 1956  Courtney Ross is a 63 y.o. year old female who is a primary care patient of Caryn Section, Kirstie Peri, MD. I reached out to [name] by phone today in response to a referral sent by [name]'s health plan.    Courtney Ross was given information about Chronic Care Management services today including:  1. CCM service includes personalized support from designated clinical staff supervised by her physician, including individualized plan of care and coordination with other care providers 2. 24/7 contact phone numbers for assistance for urgent and routine care needs. 3. Service will only be billed when office clinical staff spend 20 minutes or more in a month to coordinate care. 4. Only one practitioner may furnish and bill the service in a calendar month. 5. The patient may stop CCM services at any time (effective at the end of the month) by phone call to the office staff. 6. The patient will be responsible for cost sharing (co-pay) of up to 20% of the service fee (after annual deductible is met).  Patient agreed to services and verbal consent obtained.   Follow up plan: Telephone appointment with CCM team member scheduled for: 02/08/2019  [place]  [email or web address]   ??[phone]

## 2019-01-06 ENCOUNTER — Other Ambulatory Visit: Payer: Self-pay | Admitting: Obstetrics and Gynecology

## 2019-01-07 NOTE — Telephone Encounter (Signed)
Please advise on refill. I have called and left message for patient to call back to schedule an appointment. You have not seen the patient.

## 2019-01-20 ENCOUNTER — Other Ambulatory Visit: Payer: Self-pay | Admitting: Family Medicine

## 2019-01-20 MED ORDER — OXYCODONE HCL 15 MG PO TABS: 15.0000 mg | ORAL_TABLET | ORAL | 0 refills | Status: DC | PRN

## 2019-01-20 NOTE — Telephone Encounter (Signed)
Pt needing a refill on:  oxyCODONE (ROXICODONE) 15 MG immediate release tablet  Please fill at:  [place], [place] - [place] [phone] (Phone) [phone] (Fax)    Thanks, [employer]

## 2019-01-27 ENCOUNTER — Ambulatory Visit: Payer: Medicare Other | Attending: Pain Medicine | Admitting: Pain Medicine

## 2019-01-27 ENCOUNTER — Encounter: Payer: Self-pay | Admitting: Pain Medicine

## 2019-01-27 ENCOUNTER — Other Ambulatory Visit: Payer: Self-pay

## 2019-01-27 VITALS — BP 128/85 | HR 73 | Temp 98.4°F | Resp 16 | Ht 68.0 in | Wt 168.0 lb

## 2019-01-27 DIAGNOSIS — M961 Postlaminectomy syndrome, not elsewhere classified: Secondary | ICD-10-CM

## 2019-01-27 DIAGNOSIS — M5137 Other intervertebral disc degeneration, lumbosacral region: Secondary | ICD-10-CM | POA: Diagnosis not present

## 2019-01-27 DIAGNOSIS — G8929 Other chronic pain: Secondary | ICD-10-CM | POA: Diagnosis not present

## 2019-01-27 DIAGNOSIS — Z969 Presence of functional implant, unspecified: Secondary | ICD-10-CM

## 2019-01-27 DIAGNOSIS — M5416 Radiculopathy, lumbar region: Secondary | ICD-10-CM | POA: Diagnosis not present

## 2019-01-27 DIAGNOSIS — T859XXA Unspecified complication of internal prosthetic device, implant and graft, initial encounter: Secondary | ICD-10-CM | POA: Diagnosis not present

## 2019-01-27 NOTE — Progress Notes (Signed)
Patient's Name: Courtney Ross  MRN: [id number]  Referring Provider: Birdie Sons, MD  DOB: 10-30-1955  PCP: Birdie Sons, MD  DOS: 01/27/2019  Note by: Gaspar Cola, MD  Service setting: Ambulatory outpatient  Attending: Gaspar Cola, MD  Location: [facility] (AMB) Pain Management Facility  Specialty: Interventional Pain Management  Patient type: Established   CC: Back Pain (thoracic, right)  HPI: Courtney Ross is a 63 y.o. year old, female patient, who comes today complaining of Back Pain (thoracic, right) Her last contact with [place] was on Visit date not found. I personally saw her on Visit date not found. Severity of the pain is described as a 4 /10. Her pain is located in the area of the Back Mid, Right and raidates to right hip. Onset: More than a month ago. Pain is descriptors include: Aching. Tempo is described as: Constant. Modifying factors: lying down on left side.  The patient returns to the clinic today after last having been seen on 05/09/2015.  This patient is well-known to me as I implanted her spinal cord stimulator around 2011.  She indicates that this was working really well for her, but over time she started having some problems with it.  She indicates that a couple years ago it stopped working well for her and it was not until recently that she decided to come to [place] for assistance.  She does take chronic opioids, which apparently have been written for by Dr. Lelon Huh.  Today I reviewed the patient's PMP and clearly she has significant tolerance to the opioids.  As a positive point, the patient has lost over 40 pounds since the last time that I saw her and she seems to be doing much better.  She describes having started exercising regularly with her daughter and this is how she managed to lose the weight.  The patient came to [place] via the [place] representative.  Apparently she contacted [employer] to see if they could do something for her and they arranged for  her follow-up visit with [place].  Today she came into the clinic exam we analyzed the device and reprogrammed it, in an effort to see if we could fix it for her.  However, they retrieved information indicates that out of the 4 leads that she has implanted, one of them is fractured and is not working correctly.  However, after several attempts that programming the device, the [employer] representative was able to again capture adequate stimulation area for her.  However, she seems to be having some problems with her programmer and she has not been able to charge the device correctly or program it at home.  Because of this, she was unable to answer several questions for me regarding the use of the device.  In any case, the analysis also revealed that the spinal cord stimulator battery has reached its end of life.  Today we have given the patient some options and she has decided to change the battery and leave the electrodes as they are.  Today I will go ahead and request a referral to Dr. Clydell Hakim with the [facility] in [place] to see if he could be still kind as to change her battery.  We will follow-up with this patient depending on her needs.  Exam: Courtney Ross  height is 5\' 8"  (1.727 m) and weight is 168 lb (76.2 kg). Her temperature is 98.4 F (36.9 C). Her blood pressure is 128/85 and her pulse  is 73. Her respiration is 16 and oxygen saturation is 100%.  Body mass index is 25.54 kg/m.  A/P: The primary encounter diagnosis was Failed back surgical syndrome. Diagnoses of Presence of functional implant ([employer] rechargeable spinal cord stimulator), Chronic lumbar radicular pain (Right side), Degeneration of lumbar or lumbosacral intervertebral disc, and Complication of implanted electronic neurostimulator of spinal cord, initial encounter were also pertinent to this visit. I am having Courtney Ross maintain her Vitamin D3, meloxicam, Restasis, multivitamin with minerals, aspirin EC,  hydroquinone, omeprazole, fluticasone, Calcium Carbonate (CALCI-CHEW PO), docusate sodium, phentermine, mupirocin cream, polyethylene glycol, Linzess, pregabalin, estradiol, diazepam, buPROPion, rOPINIRole, rosuvastatin, cyclobenzaprine, Amitiza, Myrbetriq, and oxyCODONE. Return if symptoms worsen or fail to improve.  Plan of Care  Orders:  Orders Placed This Encounter  Procedures  . Ambulatory referral to Pain Clinic    Referral Priority:   Routine    Referral Type:   Consultation    Referral Reason:   Specialty Services Required    Requested Specialty:   Pain Medicine    Number of Visits Requested:   1   Chronic Opioid Analgesic:  No opioid analgesics from our practice. Oxycodone IR 15 mg, 1 tab PO q 4 hrs (90 mg/day of oxycodone) MME: 135 mg/day, by Dr.Donald Fisher.   Medications administered: Courtney Ross had no medications administered during this visit.  See the medical record for exact dosing, route, and time of administration.  Follow-up plan:   Return if symptoms worsen or fail to improve.       Interventional management options: Planning: Spinal cord stimulator battery replacement.  The patient was referred today to Dr. Clydell Hakim for the intervention.   Palliative PRN treatment(s): Palliative management of implanted SCS.    Recent Visits No visits were found meeting these conditions.  Showing recent visits within past 90 days and meeting all other requirements   Today's Visits Date Type Provider Dept  01/27/19 Office Visit Milinda Pointer, MD [facility]  Showing today's visits and meeting all other requirements   Future Appointments No visits were found meeting these conditions.  Showing future appointments within next 90 days and meeting all other requirements   Disposition: Discharge home  Discharge Date & Time: 01/27/2019  Primary Care Physician: Birdie Sons, MD Location: [facility] Note by:  Gaspar Cola, MD Date: 01/27/2019; Time: 4:17 PM

## 2019-01-27 NOTE — Progress Notes (Deleted)
Pain Management Virtual Encounter Note - Virtual Visit via Telephone Telehealth (real-time audio visits between healthcare provider and patient).   Patient's Phone No. & Preferred Pharmacy:  [phone] (home); [phone] (mobile); (Preferred) [phone] [email or web address]  [place], [place] - [address] [place] [address] Phone: [phone] Fax: [phone]    Pre-screening note:  Our staff contacted Ms. Kroeze and offered her an "in person", "face-to-face" appointment versus a telephone encounter. She indicated preferring the telephone encounter, at this time.   Reason for Virtual Visit: COVID-19*  Social distancing based on CDC and AMA recommendations.   I contacted [name] on 01/27/2019 via telephone.      I clearly identified myself as Gaspar Cola, MD. I verified that I was speaking with the correct person using two identifiers (Name: Courtney Ross, and date of birth: 03-03-1956).  Advanced Informed Consent I sought verbal advanced consent from [name] for virtual visit interactions. I informed [name] of possible security and privacy concerns, risks, and limitations associated with providing "not-in-person" medical evaluation and management services. I also informed [name] of the availability of "in-person" appointments. Finally, I informed her that there would be a charge for the virtual visit and that she could be  personally, fully or partially, financially responsible for it. [name] expressed understanding and agreed to proceed.   Historic Elements   Courtney Ross is a 63 y.o. year old, female patient evaluated today after her last encounter by our practice on Visit date not found. [name]  has a past medical history of Anxiety, Back pain, Colon polyp, Constipation, Degenerative disc disease, lumbar, Depression, Family history of adverse reaction to anesthesia, Fatty liver, GERD  (gastroesophageal reflux disease), H/O spinal fusion, Headache, Headache, migraine (05/08/2015), High cholesterol, Hypertension, Lower back pain, Polycystic ovarian disease, PONV (postoperative nausea and vomiting), Restless leg syndrome, and Spasm (01/29/2009). She also  has a past surgical history that includes Neck surgery (2006); Spinal cord stimulator implant (2014); Ectopic pregnancy surgery (1982); Foot surgery (Left, 2017); Arthrodesis metatarsalphalangeal joint (mtpj) (Left, 12/14/2014); Tonsillectomy (1985); Appendectomy (1988); Back surgery [id number]); Breast surgery (2005); Colporrhaphy; Bladder suspension (2006); Carpal tunnel release (Bilateral, 02/26/07,05/21/07); Hammer toe surgery (Left, 03/22/2015); Abdominal hysterectomy ( ); Dilation and curettage of uterus; Rectocele repair (N/A, 08/25/2016); Hernia repair (Right, 1988); Cholecystectomy (N/A, 05/03/2018); Colonoscopy; Breast biopsy (Left, 1990's); back fusion (2004); Laminectomy (1993, 1994); and Ventral hernia repair (N/A, 09/01/2018). [name] has a current medication list which includes the following prescription(s): amitiza, aspirin ec, bupropion, calcium carbonate, vitamin d3, cyclobenzaprine, diazepam, docusate sodium, estradiol, fluticasone, hydroquinone, linzess, meloxicam, multivitamin with minerals, mupirocin cream, myrbetriq, omeprazole, oxycodone, phentermine, polyethylene glycol, pregabalin, restasis, ropinirole, and rosuvastatin. She  reports that she has never smoked. She has never used smokeless tobacco. She reports that she does not drink alcohol or use drugs. Ms. Mulhall is allergic to hydrocodone; lisinopril; mirtazapine; and other.   HPI  Today, she is being contacted for evaluation of SCS implant. Patient last seen on 2016.  Pharmacotherapy Assessment  Analgesic: No opioid analgesics from our practice. Oxycodone IR 15 mg, 1 tab PO q 4 hrs (90 mg/day of oxycodone) MME: 135 mg/day, by Dr.Donald Fisher.   UDS: No results  found for: SUMMARY Laboratory Chemistry Profile (12 mo)  Renal: 07/12/2018: BUN 15; BUN/Creatinine Ratio 17; Creatinine, Ser 0.90  Lab Results  Component Value Date   GFRAA 79 07/12/2018   GFRNONAA 68 07/12/2018   Hepatic:  07/12/2018: Albumin 4.1 Lab Results  Component Value Date   AST 18 07/12/2018   ALT 32 07/12/2018   Other: No results found for requested labs within last 8760 hours. Note: Above Lab results reviewed.  Imaging  Last 90 days:  Dg Bone Density  Result Date: 01/03/2019 EXAM: DUAL X-RAY ABSORPTIOMETRY (DXA) FOR BONE MINERAL DENSITY IMPRESSION: Technologist: SCE PATIENT BIOGRAPHICAL: Name: Courtney, Ross Patient ID: [id number] Birth Date: Oct 26, 1955 Height: 68.0 in. Gender: Female Exam Date: 01/03/2019 Weight: 167.8 lbs. Indications: Caucasian, Height Loss, High Risk Meds, Hypothyroid, Hysterectomy, Postmenopausal Fractures: Treatments: Calcium, Estrace, Multi-Vitamin, Omeprazole, Vitamin D ASSESSMENT: The BMD measured at Forearm Radius 33% is 0.801 g/cm2 with a T-score of -0.9. This patient is considered normal according to [facility]) criteria. The quality of the scan is good. Lumbar spine was not utilized due to advanced degenerative changes/scoliosis, and hardware. Site Region Measured Measured WHO Young Adult BMD Date       63      Classification T-score DualFemur Total Left 01/03/2019 63 Normal -0.2 0.989 g/cm2 Right Forearm Radius 33% 01/03/2019 63 Normal -0.9 0.801 g/cm2 World Health Organization [facility]) criteria for post-menopausal, Caucasian Women: Normal:       T-score at or above -1 SD Osteopenia:   T-score between -1 and -2.5 SD Osteoporosis: T-score at or below -2.5 SD RECOMMENDATIONS: 1. All patients should optimize calcium and vitamin D intake. 2. Consider FDA-approved medical therapies in postmenopausal women and men aged 57 years and older, based on the following: a. A hip or vertebral(clinical or morphometric) fracture b. T-score < -2.5 at  the femoral neck or spine after appropriate evaluation to exclude secondary causes c. Low bone mass (T-score between -1.0 and -2.5 at the femoral neck or spine) and a 10-year probability of a hip fracture > 3% or a 10-year probability of a major osteoporosis-related fracture > 20% based on the [place]-adapted WHO algorithm d. Clinician judgment and/or patient preferences may indicate treatment for people with 10-year fracture probabilities above or below these levels FOLLOW-UP: People with diagnosed cases of osteoporosis or at high risk for fracture should have regular bone mineral density tests. For patients eligible for Medicare, routine testing is allowed once every 2 years. The testing frequency can be increased to one year for patients who have rapidly progressing disease, those who are receiving or discontinuing medical therapy to restore bone mass, or have additional risk factors. I have reviewed this report, and agree with the above findings. [facility] Radiology Electronically Signed   By: Lowella Grip III M.D.   On: 01/03/2019 14:29   Assessment  The primary encounter diagnosis was Failed back surgical syndrome. Diagnoses of Presence of functional implant (Medtronic rechargeable spinal cord stimulator), Chronic lumbar radicular pain (Right side), and Degeneration of lumbar or lumbosacral intervertebral disc were also pertinent to this visit.  Plan of Care  I am having [name] maintain her Vitamin D3, meloxicam, Restasis, multivitamin with minerals, aspirin EC, hydroquinone, omeprazole, fluticasone, Calcium Carbonate (CALCI-CHEW PO), docusate sodium, phentermine, mupirocin cream, polyethylene glycol, Linzess, pregabalin, estradiol, diazepam, buPROPion, rOPINIRole, rosuvastatin, cyclobenzaprine, Amitiza, Myrbetriq, and oxyCODONE.  Pharmacotherapy (Medications Ordered): No orders of the defined types were placed in this encounter.  Orders:  No orders of the defined types were placed in  this encounter.  Follow-up plan:   No follow-ups on file.      Interventional management options: Considering: None at this time.   Palliative PRN treatment(s): Palliative management of implanted SCS.  Recent Visits No visits were found meeting these conditions.  Showing recent visits within past 90 days and meeting all other requirements   Today's Visits Date Type Provider Dept  01/27/19 Appointment Milinda Pointer, MD [facility]  Showing today's visits and meeting all other requirements   Future Appointments No visits were found meeting these conditions.  Showing future appointments within next 90 days and meeting all other requirements   I discussed the assessment and treatment plan with the patient. The patient was provided an opportunity to ask questions and all were answered. The patient agreed with the plan and demonstrated an understanding of the instructions.  Patient advised to call back or seek an in-person evaluation if the symptoms or condition worsens.  Total duration of non-face-to-face encounter: *** minutes.  Note by: Gaspar Cola, MD Date: 01/27/2019; Time: 4:59 AM  Note: This dictation was prepared with Dragon dictation. Any transcriptional errors that may result from this process are unintentional.  Disclaimer:  * Given the special circumstances of the COVID-19 pandemic, the federal government has announced that the Office for Civil Rights (OCR) will exercise its enforcement discretion and will not impose penalties on physicians using telehealth in the event of noncompliance with regulatory requirements under the [place] (HIPAA) in connection with the good faith provision of telehealth during the MVVKP-22 national public health emergency. ([place])

## 2019-01-27 NOTE — Progress Notes (Signed)
Safety precautions to be maintained throughout the outpatient stay will include: orient to surroundings, keep bed in low position, maintain call bell within reach at all times, provide assistance with transfer out of bed and ambulation.  

## 2019-02-04 ENCOUNTER — Other Ambulatory Visit: Payer: Self-pay | Admitting: Family Medicine

## 2019-02-08 ENCOUNTER — Telehealth: Payer: Medicare Other

## 2019-02-08 ENCOUNTER — Ambulatory Visit: Payer: Self-pay

## 2019-02-08 NOTE — Chronic Care Management (AMB) (Signed)
   Chronic Care Management   Unsuccessful Call Note 02/08/2019 Name: Courtney Ross MRN: [id number] DOB: 11-14-1955  Courtney Ross is a 63 year old female who sees Dr. Lelon Huh for primary care. Ms. Dehne's health plan asked the CCM team to consult the patient for care coordination and chronic case management. Patient's last office visit was 07/07/2018.     Was unable to reach patient via telephone at scheduled appointment time today for initial health assessment and goals setting. I have left HIPAA compliant voicemail asking patient to return my call. (unsuccessful outreach #1).   Plan: Will follow-up within 14 business days via telephone.    Lesbia Ottaway E. Rollene Rotunda, RN, BSN Nurse Care Coordinator [facility] Care Management [phone]

## 2019-02-09 ENCOUNTER — Other Ambulatory Visit: Payer: Self-pay

## 2019-02-09 ENCOUNTER — Ambulatory Visit: Payer: Self-pay

## 2019-02-09 ENCOUNTER — Ambulatory Visit (INDEPENDENT_AMBULATORY_CARE_PROVIDER_SITE_OTHER): Payer: Medicare Other | Admitting: Obstetrics and Gynecology

## 2019-02-09 ENCOUNTER — Encounter: Payer: Self-pay | Admitting: Obstetrics and Gynecology

## 2019-02-09 ENCOUNTER — Ambulatory Visit: Payer: Medicare Other | Admitting: Pain Medicine

## 2019-02-09 VITALS — BP 129/75 | HR 86 | Ht 68.0 in | Wt 167.5 lb

## 2019-02-09 DIAGNOSIS — Z713 Dietary counseling and surveillance: Secondary | ICD-10-CM | POA: Diagnosis not present

## 2019-02-09 DIAGNOSIS — Z7689 Persons encountering health services in other specified circumstances: Secondary | ICD-10-CM

## 2019-02-09 DIAGNOSIS — Z6825 Body mass index (BMI) 25.0-25.9, adult: Secondary | ICD-10-CM | POA: Diagnosis not present

## 2019-02-09 MED ORDER — PHENTERMINE HCL 37.5 MG PO TABS: 37.5000 mg | ORAL_TABLET | Freq: Every day | ORAL | 0 refills | Status: DC

## 2019-02-09 NOTE — Chronic Care Management (AMB) (Signed)
  Chronic Care Management   Note  02/09/2019 Name: Courtney Ross MRN: [id number] DOB: 05-16-1956  Care Coordination: Incoming call from [name] in response to left message 02/09/2019 by RN CM. [name] apologizes for her missed initial assessment telephone appointment yesterday. States family emergency as reason. She wishes to reschedule.  Plan: Initial telephone assessment and goals setting appointment rescheduled for 02/16/1009 at 4:00  Courtney Ross E. Courtney Rotunda, RN, BSN Nurse Care Coordinator [facility] Care Management [phone]

## 2019-02-09 NOTE — Progress Notes (Signed)
  Subjective:     Patient ID: Courtney Ross, female   DOB: 1955-08-13, 63 y.o.   MRN: [id number]  HPI Here to discuss restarting weight loss medication for a short course. Did well on it in the past and back surgeon recommends her restarting it to get the weight she recently gained after surgery back off. Also states the B12 injections help with energy and desires restarting them.   Review of Systems  Constitutional: Positive for fatigue.  Musculoskeletal: Positive for back pain.  All other systems reviewed and are negative.      Objective:   Physical Exam A&Ox4 Well groomed female in no distress Blood pressure 129/75, pulse 86, height 5\' 8"  (1.727 m), weight 167 lb 8 oz (76 kg).  Body mass index is 25.47 kg/m.  PE not indicated.     Assessment:     Encounter for weight management BMI 25.47 fatigue    Plan:     Will do one month of adipex and B12, with follow up in 2 months at [facility]. B12 injection given today.    Devlynn Knoff,CNM

## 2019-02-11 ENCOUNTER — Telehealth: Payer: Self-pay | Admitting: Family Medicine

## 2019-02-11 NOTE — Telephone Encounter (Signed)
Pt needing Dr. Caryn Section to request a copy of the bone density scan results from [facility].  Fax request to [phone]  Needs to be faxed to [place]:  Dr. Modena Morrow at [phone]  Thanks, [facility]

## 2019-02-15 ENCOUNTER — Encounter: Payer: Self-pay | Admitting: Physical Therapy

## 2019-02-15 DIAGNOSIS — M25551 Pain in right hip: Secondary | ICD-10-CM

## 2019-02-15 DIAGNOSIS — R202 Paresthesia of skin: Secondary | ICD-10-CM

## 2019-02-15 DIAGNOSIS — G8929 Other chronic pain: Secondary | ICD-10-CM

## 2019-02-15 DIAGNOSIS — R293 Abnormal posture: Secondary | ICD-10-CM

## 2019-02-15 DIAGNOSIS — M6281 Muscle weakness (generalized): Secondary | ICD-10-CM

## 2019-02-15 NOTE — Therapy (Addendum)
[place] [facility] [address] [place], [address] Phone: [phone]   Fax:  [phone]  Physical Therapy No-Visit Discharge Summary Reporting period: 06/28/2018 - 02/15/2019  Patient Details  Name: Courtney Ross MRN: [id number] Date of Birth: Sep 30, 1955 Referring Provider (PT):  Juanetta Beets Elayne Snare, MD.    Encounter Date: 02/15/2019    Past Medical History:  Diagnosis Date  . Anxiety   . Back pain   . Colon polyp   . Constipation   . Degenerative disc disease, lumbar   . Depression   . Family history of adverse reaction to anesthesia    daughter nausea  . Fatty liver   . GERD (gastroesophageal reflux disease)   . H/O spinal fusion   . Headache    migraines  . Headache, migraine 05/08/2015  . High cholesterol   . Hypertension   . Lower back pain   . Polycystic ovarian disease   . PONV (postoperative nausea and vomiting)    due to surgery from the 1990's  . Restless leg syndrome   . Spasm 01/29/2009    Past Surgical History:  Procedure Laterality Date  . [place]  . APPENDECTOMY  1988  . ARTHRODESIS METATARSALPHALANGEAL JOINT (MTPJ) Left 12/14/2014   Procedure: ARTHRODESIS METATARSALPHALANGEAL JOINT (MTPJ) 1ST;  Surgeon: Albertine Patricia, DPM;  Location: [place];  Service: Podiatry;  Laterality: Left;  LMA  . back fusion  2004   lumbar region. had broken facet joint.   Marland Kitchen BACK SURGERY  [phone]   x3  . BLADDER SUSPENSION  2006  . BREAST BIOPSY Left 1990's   core [place]  . BREAST SURGERY  2005   lumpectomy  . CARPAL TUNNEL RELEASE Bilateral 02/26/07,05/21/07  . CHOLECYSTECTOMY N/A 05/03/2018   Procedure: LAPAROSCOPIC CHOLECYSTECTOMY WITH INTRAOPERATIVE CHOLANGIOGRAM;  Surgeon: Robert Bellow, MD;  Location: [facility];  Service: General;  Laterality: N/A;  . COLONOSCOPY    . COLPORRHAPHY    . DILATION AND CURETTAGE OF UTERUS    . [place]  . FOOT SURGERY Left 2017   fusion on big toe, hammer toe, bunions  . HAMMER TOE SURGERY Left 03/22/2015   Procedure: HAMMER TOE CORRECTION L 2ND AND 3RD ;  Surgeon: Albertine Patricia, DPM;  Location: [place];  Service: Podiatry;  Laterality: Left;  WITH LOCAL  . HERNIA REPAIR Right 1988   inguinal  . LAMINECTOMY  1993, 1994  . NECK SURGERY  2006   fusion  . RECTOCELE REPAIR N/A 08/25/2016   Procedure: POSTERIOR REPAIR (RECTOCELE);  Surgeon: Brayton Mars, MD;  Location: [facility];  Service: Gynecology;  Laterality: N/A;  . SPINAL CORD STIMULATOR IMPLANT  2014   stimulator in the ileo inguinal nerve due to scar tissue in abdomen  . TONSILLECTOMY  1985  . VENTRAL HERNIA REPAIR N/A 09/01/2018   6.4 cm Ventra lite mesh. HERNIA REPAIR VENTRAL ADULT;  Surgeon: Robert Bellow, MD;  Location: [facility];  Service: General;  Laterality: N/A;    There were no vitals filed for this visit.  Subjective Assessment - 02/15/19 1608    Subjective  Patient did not return to physical therapy after holding due to need for hernia surgery and is now being discharged from this episode of care    Pertinent History  Patient is a 63 y.o. female who presents to outpatient physical therapy with a referral for medical diagnosis adolescent  idiopathic scoliosis of lumbar region with request to include aquatic therapy in her POC. This patient's chief complaints consist of pain, paresthesia, stiffness, weakness, decreased function leading to the following functional deficits: difficulty with basic ADLs, IADLs, walking, sitting, standing, lifting. Relevant past medical history and comorbidities: Does have a history of lumbar surgery (1993 for ruptured disc, spinal fusion in back and neck last surgery about 10 years ago). History of migraines (now rare).  History of inguinal nerve pain induced by surgery for atopic pregnancy that lead to right groin pain and associated with spinal cord stimulator implant,  history of "aflicted feet" (bilateral bunionectomy), bilateral carpal tunnel release. Denies history of cancer, diabetes, heart problems, lung problems, seizures.       OBJECTIVE Patient not present for examination. Please see prior documentation for latest objective data.      PT Short Term Goals - 02/15/19 1609      PT SHORT TERM GOAL #1   Title  Be independent with initial home exercise program for self-management of symptoms.    Baseline  initial program provided at IE    Time  3    Period  Weeks    Status  Achieved    Target Date  07/26/18        PT Long Term Goals - 02/15/19 1609      PT LONG TERM GOAL #1   Title  Be independent with a long-term home exercise program for self-management of symptoms.     Baseline  initial HEP provided at IE and has not yet received final HEP due to ongoing need for therapy.     Time  8    Period  Weeks    Status  Partially Met    Target Date  10/13/18      PT LONG TERM GOAL #2   Title  Demonstrate improved FOTO score by 10 units to demonstrate improvement in overall condition and self-reported functional ability.     Baseline  FOTO = 40 (06/28/2018); FOTO = 41 (08/18/2018);    Time  8    Period  Weeks    Status  Partially Met    Target Date  09/15/18      PT LONG TERM GOAL #3   Title  Reduce pain with functional activities to equal or less than 3/10 to allow patient to complete usual activities including ADLs, IADLs, and social engagement with less difficulty.     Baseline  gets up to 9/10 (08/18/2018);     Time  8    Period  Weeks    Status  Partially Met    Target Date  10/13/18      PT LONG TERM GOAL #4   Title  Improve lumbar extension AROM by 50% to allow patient to complete valued activities with less difficulty.     Baseline  25% of normal (06/28/2018)    Time  8    Period  Weeks    Status  Not Met    Target Date  10/13/18      PT LONG TERM GOAL #5   Title  Patient will demonstrate B LE strength 4+/5 to demonstrate  functional strength for independent gait, increased standing tolerance, reaching, lifting, carrying and increased ADL ability.    Baseline  see objective exam    Time  8    Period  Weeks    Status  Partially Met    Target Date  10/13/18       Plan -  02/15/19 1612    Clinical Impression Statement  Patient completed 12 physical therapy sessions (last was 08/24/2018) before holding physical therapy due to need for hernia surgery. She did not return to physical therapy following her procedure and will now be discharged from this episode of care. Prior to this time, patient was making gradual progress towards some goals and feeling benefit from aquatic therapy.    Rehab Potential  Fair    Clinical Impairments Affecting Rehab Potential  (+) motivation, history of self-management and preservation of function with long term condition; (-) complexity of condition, chronic nature of condition, multiple comorbidities    PT Frequency  2x / week    PT Duration  8 weeks    PT Treatment/Interventions  Aquatic Therapy;ADLs/Self Care Home Management;Biofeedback;Cryotherapy;Moist Heat;Electrical Stimulation;Gait training;Stair training;Functional mobility training;Therapeutic activities;Therapeutic exercise;Balance training;Neuromuscular re-education;Patient/family education;Manual techniques;Passive range of motion;Dry needling;Joint Manipulations    PT Next Visit Plan  patient is now discharged from physical therapy after she stopped due to medical condition and did not return    PT Home Exercise Plan  Medbridge Access Code: [email or web address]     Consulted and Agree with Plan of Care  Patient       Patient will benefit from skilled therapeutic intervention in order to improve the following deficits and impairments:  Abnormal gait, Decreased balance, Decreased endurance, Decreased mobility, Difficulty walking, Hypomobility, Increased muscle spasms, Impaired sensation, Decreased knowledge of precautions, Decreased range of  motion, Decreased activity tolerance, Impaired perceived functional ability, Decreased strength, Impaired flexibility, Postural dysfunction, Pain  Visit Diagnosis: Chronic bilateral low back pain with right-sided sciatica  Paresthesia of skin  Muscle weakness (generalized)  Abnormal posture  Pain in right hip     Problem List Patient Active Problem List   Diagnosis Date Noted  . Chronic, continuous use of opioids 07/07/2018  . Disorder of bursae of shoulder region 03/04/2018  . Bicipital tenosynovitis 03/04/2018  . Cervical radiculitis 03/04/2018  . Shoulder pain 03/04/2018  . External hemorrhoids without complication 67/89/3810  . Dyspareunia in female 12/10/2017  . Rectocele 04/11/2016  . Cystocele, midline 04/11/2016  . Status post abdominal hysterectomy 04/10/2016  . Menopause 04/10/2016  . Essential hypertension 05/29/2015  . 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 Chronic pain of lower extremity (Right side) 05/09/2015  . Chronic lumbar radicular pain (Right side) 05/09/2015  . Chronic right groin pain 05/09/2015  . Complication of implanted electronic neurostimulator of spinal cord ([place]) (battery depletion) 05/09/2015  . 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 HCV antibody positive 05/08/2015  . H/O adenomatous polyp of colon 05/08/2015  . Headache, migraine 05/08/2015  . Restless legs syndrome 05/08/2015  . Rotator cuff syndrome of shoulder and allied disorders 05/08/2015  . Hand tendinitis 05/08/2015  . Anxiety 12/12/2014  . Chronic pain associated with significant psychosocial dysfunction 11/22/2014  . 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 Atypical squamous cells of undetermined significance on cytologic smear of vagina (ASC-US) 11/22/2014  . Snapping thumb syndrome 11/22/2014  . Chronic pain syndrome 11/22/2014  . Disorder of skin and subcutaneous tissue 11/22/2014  .  Hernia, inguinal, unilateral 05/01/2009  . Dyslipidemia 02/25/2007  . Degeneration of intervertebral disc of lumbosacral region 08/19/2006  . Degeneration of lumbar or lumbosacral intervertebral disc 08/19/2006  . Major depressive disorder with single episode, in partial remission ([place]) 04/16/2006  . Fatty liver 06/23/2005    Everlean Alstrom. Graylon Good, PT, DPT 02/15/19, 4:13 PM  Addendum 02/15/2019 to correct note  title and reporting period.  Everlean Alstrom. Graylon Good, PT, DPT 02/15/19, 4:27 PM  [facility] [address], [place], [address] Phone: [phone]   Fax:  [phone]  Name: ANNICK DIMAIO MRN: [id number] Date of Birth: 09-05-1955

## 2019-02-15 NOTE — Telephone Encounter (Signed)
Bone Density faxed.   Thanks,   -Mickel Baas

## 2019-02-17 ENCOUNTER — Encounter: Payer: Self-pay | Admitting: Family Medicine

## 2019-02-17 ENCOUNTER — Telehealth: Payer: Self-pay

## 2019-02-17 ENCOUNTER — Other Ambulatory Visit: Payer: Self-pay

## 2019-02-17 ENCOUNTER — Ambulatory Visit (INDEPENDENT_AMBULATORY_CARE_PROVIDER_SITE_OTHER): Payer: Medicare Other

## 2019-02-17 DIAGNOSIS — E785 Hyperlipidemia, unspecified: Secondary | ICD-10-CM | POA: Diagnosis not present

## 2019-02-17 DIAGNOSIS — K760 Fatty (change of) liver, not elsewhere classified: Secondary | ICD-10-CM

## 2019-02-17 NOTE — Patient Instructions (Signed)
Thank you allowing the Chronic Care Management Team to be a part of your care! It was a pleasure speaking with you today!  1. Please continue to take all your medications as prescribed, including your statin every other day. 2. Please contact Dr. Caryn Section through mychart to discuss the possibility of increasing your dosage of requipt since the 1mg  is no longer working 3. Please review the EMMI education and emailed education prior to two weeks. I will have the pharmacist Ruben Reason follow up with you in 2 weeks to see if you have any questions. 4. Continue to exercist (walk, aerobics, stretches) as often as you can. This will help lower your total cholesterol.  CCM (Chronic Care Management) Team   Trish Fountain RN, BSN Nurse Care Coordinator  [phone]  Ruben Reason PharmD  Clinical Pharmacist  [phone]   Elliot Gurney, LCSW Clinical Social Worker [phone]  Goals Addressed            This Visit's Progress   . I know I need to get my cholesterol down (pt-stated)       Current Barriers:  . Chronic Disease Management support and education needs related to hyperlipidemia management  Nurse Case Manager Clinical Goal(s):  Marland Kitchen Over the next 14 days, patient will demonstrate improved adherence to prescribed treatment plan for hyperlipidemia management as evidenced byadhering to a low cholesterol diet, taking cholesterol medications as directed, and exercising daily as tolerated  Interventions:  . Evaluation of current treatment plan related to hyperlipidemia and patient's adherence to plan as established by provider. . Provided education to patient re: lipid lowering diet and elevated cholesterol's effects on the body including increasing rist of stroke and MI . Reviewed medications with patient and discussed importance of compliance. . Discussed plans with patient for ongoing care management follow up and provided patient with direct contact information for care  management team . Provided patient with written educational materials related to hyperlipidemia and low cholesterol diet  Patient Self Care Activities:  . Self administers medications as prescribed . Attends all scheduled provider appointments . Calls pharmacy for medication refills . Attends church or other social activities . Performs ADL's independently . Performs IADL's independently . Calls provider office for new concerns or questions  Initial goal documentation        Print copy of patient instructions provided.  via email  The care management team will reach out to the patient again over the next 14 days.   SYMPTOMS OF A STROKE   You have any symptoms of stroke. "BE FAST" is an easy way to remember the main warning signs: ? B - Balance. Signs are dizziness, sudden trouble walking, or loss of balance. ? E - Eyes. Signs are trouble seeing or a sudden change in how you see. ? F - Face. Signs are sudden weakness or loss of feeling of the face, or the face or eyelid drooping on one side. ? A - Arms. Signs are weakness or loss of feeling in an arm. This happens suddenly and usually on one side of the body. ? S - Speech. Signs are sudden trouble speaking, slurred speech, or trouble understanding what people say. ? T - Time. Time to call emergency services. Write down what time symptoms started.  You have other signs of stroke, such as: ? A sudden, very bad headache with no known cause. ? Feeling sick to your stomach (nausea). ? Throwing up (vomiting). ? Jerky movements you cannot control (seizure).  SYMPTOMS OF A HEART ATTACK  What are the signs or symptoms? Symptoms of this condition include:  Chest pain. It may feel like: ? Crushing or squeezing. ? Tightness, pressure, fullness, or heaviness.  Pain in the arm, neck, jaw, back, or upper body.  Shortness of breath.  Heartburn.  Indigestion.  Nausea.  Cold sweats.  Feeling tired.  Sudden  lightheadedness.    Fat and Cholesterol Restricted Eating Plan Getting too much fat and cholesterol in your diet may cause health problems. Choosing the right foods helps keep your fat and cholesterol at normal levels. This can keep you from getting certain diseases.  What are tips for following this plan? Meal planning  At meals, divide your plate into four equal parts: ? Fill one-half of your plate with vegetables and green salads. ? Fill one-fourth of your plate with whole grains. ? Fill one-fourth of your plate with low-fat (lean) protein foods.  Eat fish that is high in omega-3 fats at least two times a week. This includes mackerel, tuna, sardines, and salmon.  Eat foods that are high in fiber, such as whole grains, beans, apples, broccoli, carrots, peas, and barley. General tips   Work with your doctor to lose weight if you need to.  Avoid: ? Foods with added sugar. ? Fried foods. ? Foods with partially hydrogenated oils.  Limit alcohol intake to no more than 1 drink a day for nonpregnant women and 2 drinks a day for men. One drink equals 12 oz of beer, 5 oz of wine, or 1 oz of hard liquor. Reading food labels  Check food labels for: ? Trans fats. ? Partially hydrogenated oils. ? Saturated fat (g) in each serving. ? Cholesterol (mg) in each serving. ? Fiber (g) in each serving.  Choose foods with healthy fats, such as: ? Monounsaturated fats. ? Polyunsaturated fats. ? Omega-3 fats.  Choose grain products that have whole grains. Look for the word "whole" as the first word in the ingredient list. Cooking  Cook foods using low-fat methods. These include baking, boiling, grilling, and broiling.  Eat more home-cooked foods. Eat at restaurants and buffets less often.  Avoid cooking using saturated fats, such as butter, cream, palm oil, palm kernel oil, and coconut oil.   Recommended foods Fruits  All fresh, canned (in natural juice), or frozen  fruits. Vegetables  Fresh or frozen vegetables (raw, steamed, roasted, or grilled). Green salads. Grains  Whole grains, such as whole wheat or whole grain breads, crackers, cereals, and pasta. Unsweetened oatmeal, bulgur, barley, quinoa, or brown rice. Corn or whole wheat flour tortillas. Meats and other protein foods  Ground beef (85% or leaner), grass-fed beef, or beef trimmed of fat. Skinless chicken or [place]. Ground chicken or [place]. Pork trimmed of fat. All fish and seafood. Egg whites. Dried beans, peas, or lentils. Unsalted nuts or seeds. Unsalted canned beans. Nut butters without added sugar or oil. Dairy  Low-fat or nonfat dairy products, such as skim or 1% milk, 2% or reduced-fat cheeses, low-fat and fat-free ricotta or cottage cheese, or plain low-fat and nonfat yogurt. Fats and oils  Tub margarine without trans fats. Light or reduced-fat mayonnaise and salad dressings. Avocado. Olive, canola, sesame, or safflower oils. The items listed above may not be a complete list of foods and beverages you can eat. Contact a dietitian for more information.  Foods to avoid Fruits  Canned fruit in heavy syrup. Fruit in cream or butter sauce. Fried fruit. Vegetables  Vegetables cooked in  cheese, cream, or butter sauce. Fried vegetables. Grains  White bread. White pasta. White rice. Cornbread. Bagels, pastries, and croissants. Crackers and snack foods that contain trans fat and hydrogenated oils. Meats and other protein foods  Fatty cuts of meat. Ribs, chicken wings, bacon, sausage, bologna, salami, chitterlings, fatback, hot dogs, bratwurst, and packaged lunch meats. Liver and organ meats. Whole eggs and egg yolks. Chicken and [place] with skin. Fried meat. Dairy  Whole or 2% milk, cream, half-and-half, and cream cheese. Whole milk cheeses. Whole-fat or sweetened yogurt. Full-fat cheeses. Nondairy creamers and whipped toppings. Processed cheese, cheese spreads, and cheese  curds. Beverages  Alcohol. Sugar-sweetened drinks such as sodas, lemonade, and fruit drinks. Fats and oils  Butter, stick margarine, lard, shortening, ghee, or bacon fat. Coconut, palm kernel, and palm oils. Sweets and desserts  Corn syrup, sugars, honey, and molasses. Candy. Jam and jelly. Syrup. Sweetened cereals. Cookies, pies, cakes, donuts, muffins, and ice cream. The items listed above may not be a complete list of foods and beverages you should avoid. Contact a dietitian for more information. Summary  Choosing the right foods helps keep your fat and cholesterol at normal levels. This can keep you from getting certain diseases.  At meals, fill one-half of your plate with vegetables and green salads.  Eat high-fiber foods, like whole grains, beans, apples, carrots, peas, and barley.  Limit added sugar, saturated fats, alcohol, and fried foods. This information is not intended to replace advice given to you by your health care provider. Make sure you discuss any questions you have with your health care provider. Document Released: 12/09/2011 Document Revised: 02/10/2018 Document Reviewed: 02/24/2017 Elsevier Patient Education  [place].  High Cholesterol  High cholesterol is a condition in which the blood has high levels of a white, waxy, fat-like substance (cholesterol). The human body needs small amounts of cholesterol. The liver makes all the cholesterol that the body needs. Extra (excess) cholesterol comes from the food that we eat. Cholesterol is carried from the liver by the blood through the blood vessels. If you have high cholesterol, deposits (plaques) may build up on the walls of your blood vessels (arteries). Plaques make the arteries narrower and stiffer. Cholesterol plaques increase your risk for heart attack and stroke. Work with your health care provider to keep your cholesterol levels in a healthy range.  What increases the risk? This condition is more  likely to develop in people who:  Eat foods that are high in animal fat (saturated fat) or cholesterol.  Are overweight.  Are not getting enough exercise.  Have a family history of high cholesterol.  What are the signs or symptoms? There are no symptoms of this condition. How is this diagnosed? This condition may be diagnosed from the results of a blood test.  If you are older than age 45, your health care provider may check your cholesterol every 4-6 years.  You may be checked more often if you already have high cholesterol or other risk factors for heart disease. The blood test for cholesterol measures:  "Bad" cholesterol (LDL cholesterol). This is the main type of cholesterol that causes heart disease. The desired level for LDL is less than 100.  "Good" cholesterol (HDL cholesterol). This type helps to protect against heart disease by cleaning the arteries and carrying the LDL away. The desired level for HDL is 60 or higher.  Triglycerides. These are fats that the body can store or burn for energy. The desired number for triglycerides is  lower than 150.  Total cholesterol. This is a measure of the total amount of cholesterol in your blood, including LDL cholesterol, HDL cholesterol, and triglycerides. A healthy number is less than 200.  How is this treated? This condition is treated with diet changes, lifestyle changes, and medicines. Diet changes  This may include eating more whole grains, fruits, vegetables, nuts, and fish.  This may also include cutting back on red meat and foods that have a lot of added sugar. Lifestyle changes  Changes may include getting at least 40 minutes of aerobic exercise 3 times a week. Aerobic exercises include walking, biking, and swimming. Aerobic exercise along with a healthy diet can help you maintain a healthy weight.  Changes may also include quitting smoking. Medicines  Medicines are usually given if diet and lifestyle changes have  failed to reduce your cholesterol to healthy levels.  Your health care provider may prescribe a statin medicine. Statin medicines have been shown to reduce cholesterol, which can reduce the risk of heart disease.  Follow these instructions at home: Eating and drinking If told by your health care provider:  Eat chicken (without skin), fish, veal, shellfish, ground [place] breast, and round or loin cuts of red meat.  Do not eat fried foods or fatty meats, such as hot dogs and salami.  Eat plenty of fruits, such as apples.  Eat plenty of vegetables, such as broccoli, potatoes, and carrots.  Eat beans, peas, and lentils.  Eat grains such as barley, rice, couscous, and bulgur wheat.  Eat pasta without cream sauces.  Use skim or nonfat milk, and eat low-fat or nonfat yogurt and cheeses.  Do not eat or drink whole milk, cream, ice cream, egg yolks, or hard cheeses.  Do not eat stick margarine or tub margarines that contain trans fats (also called partially hydrogenated oils).  Do not eat saturated tropical oils, such as coconut oil and palm oil.  Do not eat cakes, cookies, crackers, or other baked goods that contain trans fats.  General instructions  Exercise as directed by your health care provider. Increase your activity level with activities such as gardening, walking, and taking the stairs.  Take over-the-counter and prescription medicines only as told by your health care provider.  Do not use any products that contain nicotine or tobacco, such as cigarettes and e-cigarettes. If you need help quitting, ask your health care provider.  Keep all follow-up visits as told by your health care provider. This is important. Contact a health care provider if:  You are struggling to maintain a healthy diet or weight.  You need help to start on an exercise program.  You need help to stop smoking. Get help right away if:  You have chest pain.  You have trouble breathing. This  information is not intended to replace advice given to you by your health care provider. Make sure you discuss any questions you have with your health care provider. Document Released: 06/09/2005 Document Revised: 06/12/2017 Document Reviewed: 12/08/2015 Elsevier Patient Education  2020 [employer].

## 2019-02-17 NOTE — Chronic Care Management (AMB) (Signed)
  Chronic Care Management   Initial Visit Note  02/17/2019 Name: Courtney Ross MRN: [id number] DOB: 26-May-1956  Referred by: Birdie Sons, MD Reason for referral : Chronic Care Management (initial assessment)   Courtney Ross is a 63 y.o. year old female who is a primary care patient of Ross, Courtney Peri, MD. The CCM team was consulted for assistance with chronic disease management and care coordination needs.   Review of patient status, including review of consultants reports, relevant laboratory and other test results, and collaboration with appropriate care team members and the patient's provider was performed as part of comprehensive patient evaluation and provision of chronic care management services.    SDOH (Social Determinants of Health) screening performed today. See Care Plan Entry related to challenges with: None  Objective:   Goals Addressed            This Visit's Progress   . I know I need to get my cholesterol down (pt-stated)       Courtney Ross admits to not following a low cholesterol diet. She also admits to not taking her cholesterol medications as prescribed prior to her labs 07/12/2018. Total cholesterol 267 and LDL 172 are of concern. She has since tried to take her statin daily as prescribed but states it just causes her restless leg syndrome and pain in her legs to worsen. She has spoken to PCP about this and it was decided she would take her statin QOD. She does walk several times a week and she does stretches and aerobics daily to maintain strength and mobility in her spine.     Current Barriers:  . Chronic Disease Management support and education needs related to hyperlipidemia management  Nurse Case Manager Clinical Goal(s):  Marland Kitchen Over the next 14 days, patient will demonstrate improved adherence to prescribed treatment plan for hyperlipidemia management as evidenced byadhering to a low cholesterol diet, taking cholesterol medications as directed, and  exercising daily as tolerated  Interventions:  . Evaluation of current treatment plan related to hyperlipidemia and patient's adherence to plan as established by provider. . Provided education to patient re: lipid lowering diet and elevated cholesterol's effects on the body including increasing rist of stroke and MI . Reviewed medications with patient and discussed importance of compliance. . Discussed plans with patient for ongoing care management follow up and provided patient with direct contact information for care management team . Provided patient with written educational materials related to hyperlipidemia and low cholesterol diet  Patient Self Care Activities:  . Self administers medications as prescribed . Attends all scheduled provider appointments . Calls pharmacy for medication refills . Attends church or other social activities . Performs ADL's independently . Performs IADL's independently . Calls provider office for new concerns or questions  Initial goal documentation         Plan:   The care management team will reach out to the patient again over the next 14 days.   Dolores Ewing E. Rollene Rotunda, RN, BSN Nurse Care Coordinator [facility] Care Management [phone]

## 2019-02-18 ENCOUNTER — Other Ambulatory Visit: Payer: Self-pay

## 2019-02-18 MED ORDER — OXYCODONE HCL 15 MG PO TABS: 15.0000 mg | ORAL_TABLET | ORAL | 0 refills | Status: DC | PRN

## 2019-02-18 MED ORDER — ROPINIROLE HCL 2 MG PO TABS: 2.0000 mg | ORAL_TABLET | Freq: Every day | ORAL | 3 refills | Status: DC

## 2019-02-18 NOTE — Telephone Encounter (Signed)
Patient is requesting a refill on Oxycodone. 

## 2019-02-25 ENCOUNTER — Other Ambulatory Visit: Payer: Self-pay | Admitting: Family

## 2019-02-25 ENCOUNTER — Ambulatory Visit
Admission: RE | Admit: 2019-02-25 | Discharge: 2019-02-25 | Disposition: A | Discharge status: Home / Self Care | Payer: Medicare Other | Source: Ambulatory Visit | Attending: Family | Admitting: Family

## 2019-02-25 DIAGNOSIS — M419 Scoliosis, unspecified: Secondary | ICD-10-CM

## 2019-02-28 ENCOUNTER — Ambulatory Visit (HOSPITAL_COMMUNITY)
Admission: RE | Admit: 2019-02-28 | Discharge: 2019-02-28 | Disposition: A | Discharge status: Home / Self Care | Payer: Medicare Other | Source: Ambulatory Visit | Attending: Family | Admitting: Family

## 2019-02-28 DIAGNOSIS — M419 Scoliosis, unspecified: Secondary | ICD-10-CM | POA: Diagnosis not present

## 2019-02-28 DIAGNOSIS — M4184 Other forms of scoliosis, thoracic region: Secondary | ICD-10-CM | POA: Diagnosis not present

## 2019-03-03 DIAGNOSIS — M5416 Radiculopathy, lumbar region: Secondary | ICD-10-CM | POA: Diagnosis not present

## 2019-03-03 DIAGNOSIS — M549 Dorsalgia, unspecified: Secondary | ICD-10-CM | POA: Diagnosis not present

## 2019-03-03 DIAGNOSIS — G8929 Other chronic pain: Secondary | ICD-10-CM | POA: Diagnosis not present

## 2019-03-03 DIAGNOSIS — M419 Scoliosis, unspecified: Secondary | ICD-10-CM | POA: Diagnosis not present

## 2019-03-03 DIAGNOSIS — M545 Low back pain: Secondary | ICD-10-CM | POA: Diagnosis not present

## 2019-03-07 ENCOUNTER — Ambulatory Visit (INDEPENDENT_AMBULATORY_CARE_PROVIDER_SITE_OTHER): Payer: Medicare Other | Admitting: Pharmacist

## 2019-03-07 DIAGNOSIS — K760 Fatty (change of) liver, not elsewhere classified: Secondary | ICD-10-CM

## 2019-03-07 DIAGNOSIS — E785 Hyperlipidemia, unspecified: Secondary | ICD-10-CM

## 2019-03-07 NOTE — Patient Instructions (Signed)
Congratulations! You have met all case management goals! You may call the case management team at any time should you have a question or if you have new case management needs. We are happy to help you! We will let your doctor know that you have met your goals.    Thank you allowing the Chronic Care Management Team to be a part of your care!   Please call a member of the CCM (Chronic Care Management) Team with any questions or case management needs in the future:    Sujay Grundman, PharmD  Clinical Pharmacist  [phone]   

## 2019-03-07 NOTE — Chronic Care Management (AMB) (Signed)
Chronic Care Management   Follow Up Note   03/07/2019 Name: Courtney Ross MRN: [id number] DOB: 1956/05/31  Subjective Courtney Ross is a 63 y.o. year old female who is a primary care patient of Fisher, Kirstie Peri, MD. The CCM team was consulted for assistance with chronic disease management and care coordination needs.    Assessment Review of patient status, including review of consultants reports, relevant laboratory and other test results, and collaboration with appropriate care team members and the patient's provider was performed as part of comprehensive patient evaluation and provision of chronic care management services.    SDOH (Social Determinants of Health) screening performed today: None. See Care Plan for related entries.   Advanced Directives Status: N See Care Plan and Vyn[facility] application for related entries.  Outpatient Encounter Medications as of 03/07/2019  Medication Sig Note  . AMITIZA 24 MCG capsule TAKE 1 CAPSULE BY MOUTH TWICE DAILY WITHA MEAL   . aspirin EC 81 MG tablet Take 81 mg by mouth at bedtime.   Marland Kitchen buPROPion (WELLBUTRIN SR) 150 MG 12 hr tablet Take 1 tablet (150 mg total) by mouth 3 (three) times daily.   . Calcium Carbonate (CALCI-CHEW PO) Take 2 tablets by mouth daily.   . Cholecalciferol (VITAMIN D3) 5000 UNITS CAPS Take 5,000 Units by mouth daily.    . Coenzyme Q10 (COQ10) 200 MG CAPS Take 1 capsule by mouth daily.   . cyclobenzaprine (FLEXERIL) 10 MG tablet Take 1 tablet (10 mg total) by mouth 3 (three) times daily as needed for muscle spasms.   . diazepam (VALIUM) 10 MG tablet Take 1 tablet (10 mg total) by mouth every 6 (six) hours as needed. for anxiety   . docusate sodium (COLACE) 100 MG capsule Take 100 mg by mouth daily as needed for moderate constipation.    Marland Kitchen estradiol (ESTRACE) 1 MG tablet Take 1 tablet (1 mg total) by mouth daily.   . fluticasone (FLONASE) 50 MCG/ACT nasal spray Place 1 spray into both nostrils daily as needed for allergies or  rhinitis.   . hydroquinone 4 % cream Apply 1 application topically daily as needed (dark spots).    Marland Kitchen LINZESS 145 MCG CAPS capsule Take 145 mcg by mouth daily before breakfast.    . meloxicam (MOBIC) 15 MG tablet Take 15 mg by mouth at bedtime.    . Multiple Vitamin (MULTIVITAMIN WITH MINERALS) TABS tablet Take 1 tablet by mouth daily.   . mupirocin cream (BACTROBAN) 2 % Apply to affected aread twice daily as needed. (Patient taking differently: Apply 1 application topically 2 (two) times daily as needed (irritation). Apply to affected aread twice daily as needed.)   . MYRBETRIQ 25 MG TB24 tablet TAKE ONE TABLET BY MOUTH EVERY DAY (Patient not taking: Reported on 02/17/2019)   . omeprazole (PRILOSEC) 40 MG capsule Take 1 capsule (40 mg total) by mouth daily.   Marland Kitchen oxyCODONE (ROXICODONE) 15 MG immediate release tablet Take 1 tablet (15 mg total) by mouth every 4 (four) hours as needed for pain.   . phentermine (ADIPEX-P) 37.5 MG tablet Take 1 tablet (37.5 mg total) by mouth daily before breakfast.   . polyethylene glycol (MIRALAX / GLYCOLAX) packet Take 17 g by mouth 2 (two) times daily as needed for mild constipation (dissolve in an 8 oz glass of water).   . pregabalin (LYRICA) 75 MG capsule TAKE 1 CAPSULE BY MOUTH 3 TIMES DAILY   . RESTASIS 0.05 % ophthalmic emulsion Place 1 drop into both  eyes 2 (two) times daily.    Marland Kitchen rOPINIRole (REQUIP) 2 MG tablet Take 1 tablet (2 mg total) by mouth at bedtime.   . rosuvastatin (CRESTOR) 10 MG tablet Take 1 tablet (10 mg total) by mouth daily. 02/17/2019: Per MD, patient only taking QOD    No facility-administered encounter medications on file as of 03/07/2019.      Goals Addressed            This Visit's Progress   . COMPLETED: I know I need to get my cholesterol down (pt-stated)       Current Barriers:  . Chronic Disease Management support and education needs related to hyperlipidemia management  Nurse Case Manager Clinical Goal(s):  Marland Kitchen Over the next  14 days, patient will demonstrate improved adherence to prescribed treatment plan for hyperlipidemia management as evidenced byadhering to a low cholesterol diet, taking cholesterol medications as directed, and exercising daily as tolerated  Interventions:  . Evaluation of current treatment plan related to hyperlipidemia and patient's adherence to plan as established by provider. . Provided education to patient re: lipid lowering diet and elevated cholesterol's effects on the body including increasing rist of stroke and MI . Reviewed medications with patient and discussed importance of compliance. . Discussed plans with patient for ongoing care management follow up and provided patient with direct contact information for care management team . Provided patient with written educational materials related to hyperlipidemia and low cholesterol diet  Patient Self Care Activities:  . Self administers medications as prescribed . Attends all scheduled provider appointments . Calls pharmacy for medication refills . Attends church or other social activities . Performs ADL's independently . Performs IADL's independently . Calls provider office for new concerns or questions  Please see past updates related to this goal by clicking on the "Past Updates" button in the selected goal          Follow up The patient has been provided with contact information for the care management team and has been advised to call with any health related questions or concerns.   Ruben Reason, PharmD Clinical Pharmacist [place] [phone]

## 2019-03-10 DIAGNOSIS — M961 Postlaminectomy syndrome, not elsewhere classified: Secondary | ICD-10-CM | POA: Diagnosis not present

## 2019-03-10 DIAGNOSIS — R030 Elevated blood-pressure reading, without diagnosis of hypertension: Secondary | ICD-10-CM | POA: Diagnosis not present

## 2019-03-10 DIAGNOSIS — Z79891 Long term (current) use of opiate analgesic: Secondary | ICD-10-CM | POA: Diagnosis not present

## 2019-03-10 DIAGNOSIS — Z981 Arthrodesis status: Secondary | ICD-10-CM | POA: Diagnosis not present

## 2019-03-11 ENCOUNTER — Other Ambulatory Visit: Payer: Self-pay | Admitting: Anesthesiology

## 2019-03-18 IMAGING — CR DG THORACIC SPINE 2V
1 series · 3 of 3 positions shown · non-contrast
Comparison: None.

CLINICAL DATA: Thoracic spine pain.

EXAM:
THORACIC SPINE 2 VIEWS

[Series 1: dg thoracic spine 2 view · 0.14mm/px · 3 of 3 slices shown]
[im 1/3]
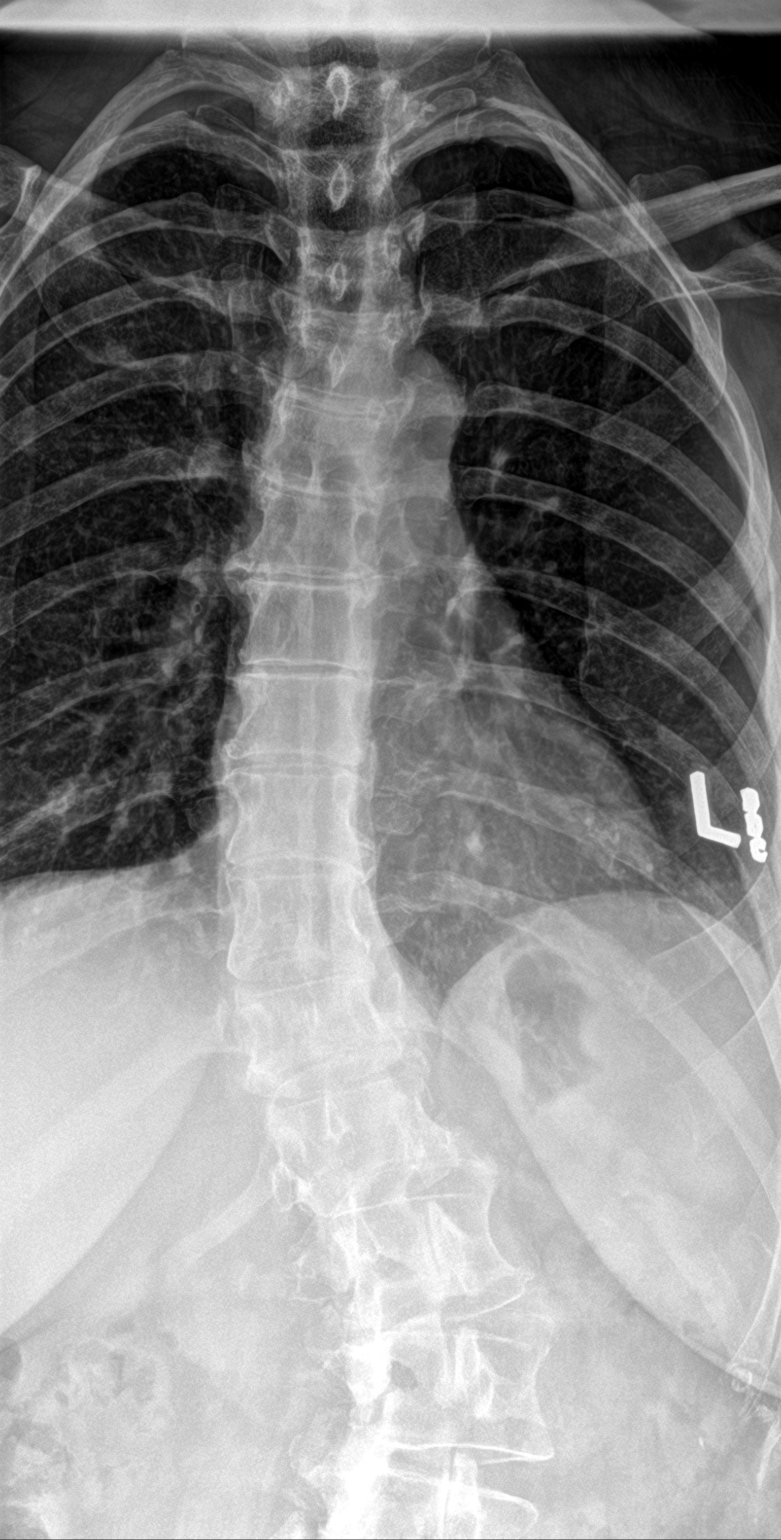
[im 2/3]
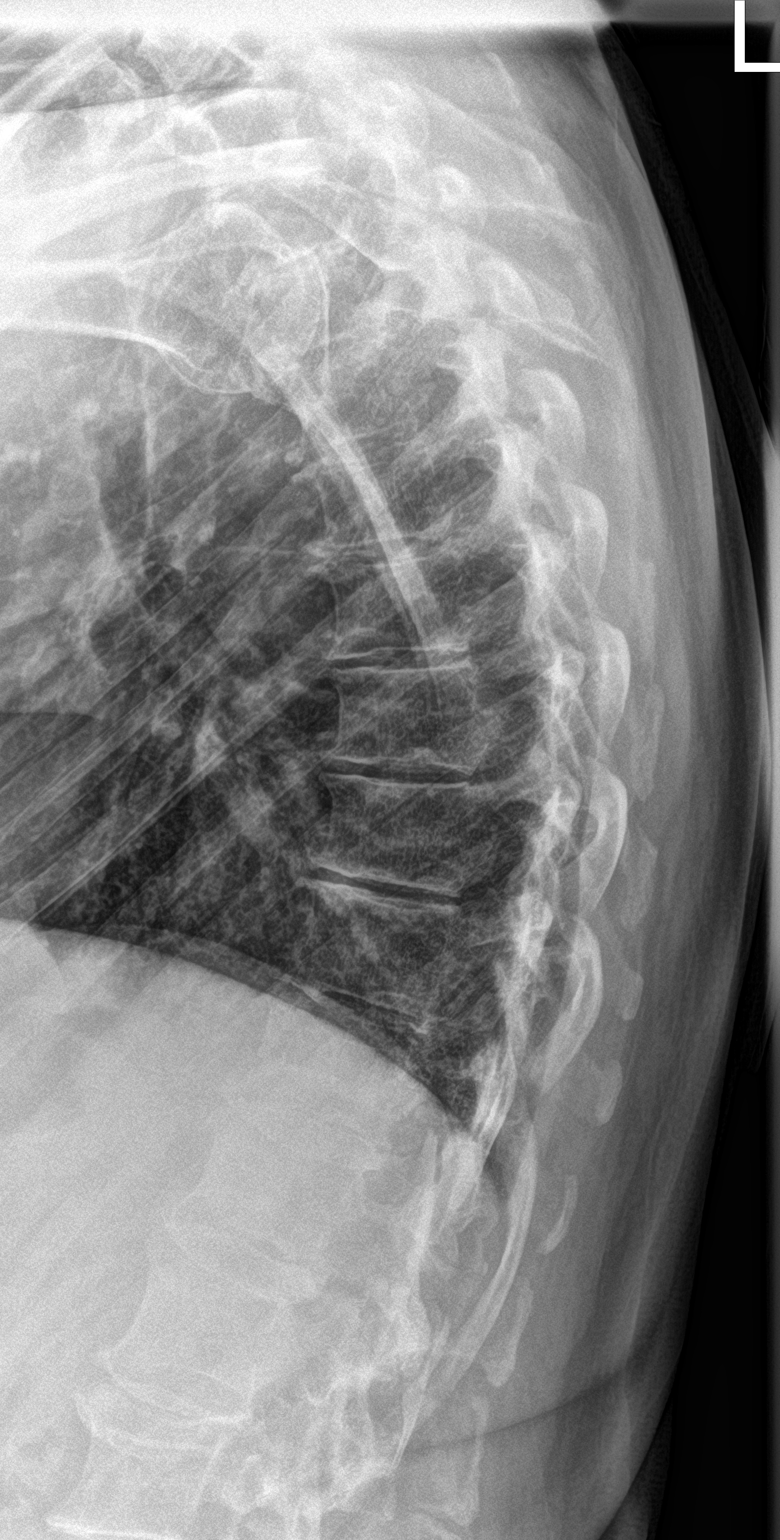
[im 3/3]
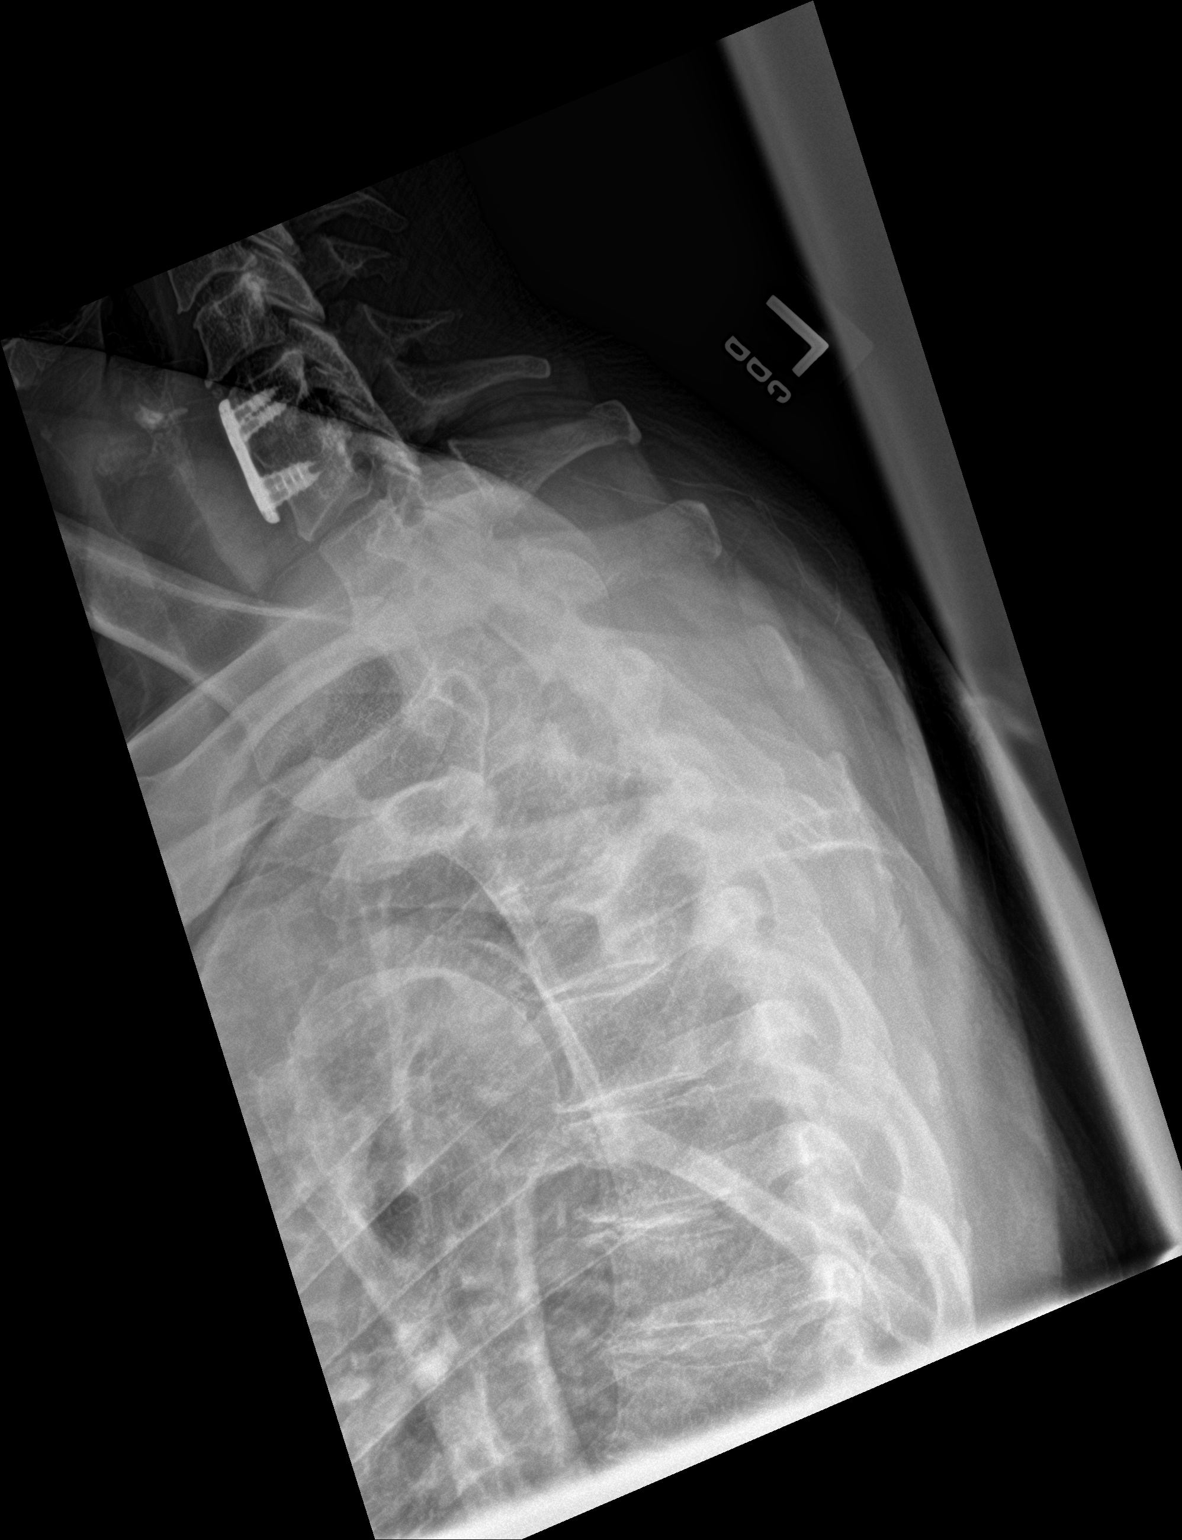

[3 of 3 positions shown; findings below may reference images not displayed]

FINDINGS: No fracture or spondylolisthesis is noted. Mild dextroscoliosis of
lower thoracic spine is noted. Mild osteophyte formation is noted in
the middle and lower thoracic spine.
IMPRESSION: Mild degenerative changes are noted. No acute abnormality seen in
the thoracic spine.

## 2019-03-21 ENCOUNTER — Other Ambulatory Visit: Payer: Self-pay | Admitting: *Deleted

## 2019-03-22 DIAGNOSIS — M47816 Spondylosis without myelopathy or radiculopathy, lumbar region: Secondary | ICD-10-CM | POA: Insufficient documentation

## 2019-03-22 DIAGNOSIS — M545 Low back pain, unspecified: Secondary | ICD-10-CM | POA: Insufficient documentation

## 2019-03-22 DIAGNOSIS — G8929 Other chronic pain: Secondary | ICD-10-CM | POA: Diagnosis not present

## 2019-03-22 MED ORDER — OXYCODONE HCL 15 MG PO TABS: 15.0000 mg | ORAL_TABLET | ORAL | 0 refills | Status: DC | PRN

## 2019-04-04 NOTE — Progress Notes (Signed)
[facility] - [place], [place] - [address] [place] [address] Phone: [phone] Fax: [phone]      Your procedure is scheduled on Friday, Oct. 16th.  Report to [facility] Main Entrance "A" at 5:30 A.M., and check in at the Admitting office.  Call this number if you have problems the morning of surgery:  [phone]  Call [phone] if you have any questions prior to your surgery date Monday-Friday 8am-4pm    Remember:  Do not eat or drink after midnight the night before your surgery    Take these medicines the morning of surgery with A SIP OF WATER   Amitiza  Bupropion (Wellbutrin)  Diazepam (Valium)  Flonase nasal spray - if needed  Omeprazole (Prilosec)  Oxycodone - if needed  Pregabalin (Lyrica)  Eye Drops - if needed  Rosuvastatin (Crestor)  Follow your surgeon's instructions on when to stop Aspirin.  If no instructions were given by your surgeon then you will need to call the office to get those instructions.    7 days prior to surgery STOP taking any Aspirin (unless otherwise instructed by your surgeon), Aleve, Naproxen, Ibuprofen, Motrin, Advil, Goody's, BC's, all herbal medications, fish oil, and all vitamins.    The Morning of Surgery  Do not wear jewelry, make-up or nail polish.  Do not wear lotions, powders, or perfumes, or deodorant  Do not shave 48 hours prior to surgery.    Do not bring valuables to the hospital.  [facility] is not responsible for any belongings or valuables.  If you are a smoker, DO NOT Smoke 24 hours prior to surgery IF you wear a CPAP at night please bring your mask, tubing, and machine the morning of surgery   Remember that you must have someone to transport you home after your surgery, and remain with you for 24 hours if you are discharged the same day.   Contacts, glasses, hearing aids, dentures or bridgework may not be worn into surgery.    Leave your suitcase in the car.  After  surgery it may be brought to your room.  For patients admitted to the hospital, discharge time will be determined by your treatment team.  Patients discharged the day of surgery will not be allowed to drive home.    Special instructions:   [facility]- Preparing For Surgery  Before surgery, you can play an important role. Because skin is not sterile, your skin needs to be as free of germs as possible. You can reduce the number of germs on your skin by washing with CHG (chlorahexidine gluconate) Soap before surgery.  CHG is an antiseptic cleaner which kills germs and bonds with the skin to continue killing germs even after washing.    Oral Hygiene is also important to reduce your risk of infection.  Remember - BRUSH YOUR TEETH THE MORNING OF SURGERY WITH YOUR REGULAR TOOTHPASTE  Please do not use if you have an allergy to CHG or antibacterial soaps. If your skin becomes reddened/irritated stop using the CHG.  Do not shave (including legs and underarms) for at least 48 hours prior to first CHG shower. It is OK to shave your face.  Please follow these instructions carefully.   1. Shower the NIGHT BEFORE SURGERY and the MORNING OF SURGERY with CHG Soap.   2. If you chose to wash your hair, wash your hair first as usual with your normal shampoo.  3. After you shampoo, rinse your  hair and body thoroughly to remove the shampoo.  4. Use CHG as you would any other liquid soap. You can apply CHG directly to the skin and wash gently with a scrungie or a clean washcloth.   5. Apply the CHG Soap to your body ONLY FROM THE NECK DOWN.  Do not use on open wounds or open sores. Avoid contact with your eyes, ears, mouth and genitals (private parts). Wash Face and genitals (private parts)  with your normal soap.   6. Wash thoroughly, paying special attention to the area where your surgery will be performed.  7. Thoroughly rinse your body with warm water from the neck down.  8. DO NOT shower/wash with  your normal soap after using and rinsing off the CHG Soap.  9. Pat yourself dry with a CLEAN TOWEL.  10. Wear CLEAN PAJAMAS to bed the night before surgery, wear comfortable clothes the morning of surgery  11. Place CLEAN SHEETS on your bed the night of your first shower and DO NOT SLEEP WITH PETS.    Day of Surgery:  Do not apply any deodorants/lotions. Please shower the morning of surgery with the CHG soap  Please wear clean clothes to the hospital/surgery center.   Remember to brush your teeth WITH YOUR REGULAR TOOTHPASTE.   Please read over the following fact sheets that you were given.

## 2019-04-05 ENCOUNTER — Encounter (HOSPITAL_COMMUNITY)
Admission: RE | Admit: 2019-04-05 | Discharge: 2019-04-05 | Disposition: A | Discharge status: Home / Self Care | Payer: Medicare Other | Source: Ambulatory Visit | Attending: Anesthesiology | Admitting: Anesthesiology

## 2019-04-05 ENCOUNTER — Other Ambulatory Visit: Payer: Self-pay

## 2019-04-05 ENCOUNTER — Other Ambulatory Visit (HOSPITAL_COMMUNITY)
Admission: RE | Admit: 2019-04-05 | Discharge: 2019-04-05 | Disposition: A | Discharge status: Home / Self Care | Payer: Medicare Other | Source: Ambulatory Visit | Attending: Anesthesiology | Admitting: Anesthesiology

## 2019-04-05 DIAGNOSIS — Z01818 Encounter for other preprocedural examination: Secondary | ICD-10-CM | POA: Insufficient documentation

## 2019-04-05 DIAGNOSIS — Z20828 Contact with and (suspected) exposure to other viral communicable diseases: Secondary | ICD-10-CM | POA: Diagnosis not present

## 2019-04-05 LAB — CBC
HCT: 42.5 % (ref 36.0–46.0)
Hemoglobin: 13.5 g/dL (ref 12.0–15.0)
MCH: 26.1 pg (ref 26.0–34.0)
MCHC: 31.8 g/dL (ref 30.0–36.0)
MCV: 82.2 fL (ref 80.0–100.0)
Platelets: 252 10*3/uL (ref 150–400)
RBC: 5.17 MIL/uL — ABNORMAL HIGH (ref 3.87–5.11)
RDW: 13.6 % (ref 11.5–15.5)
WBC: 6.7 10*3/uL (ref 4.0–10.5)
nRBC: 0 % (ref 0.0–0.2)

## 2019-04-05 LAB — COMPREHENSIVE METABOLIC PANEL
ALT: 19 U/L (ref 0–44)
AST: 17 U/L (ref 15–41)
Albumin: 3.5 g/dL (ref 3.5–5.0)
Alkaline Phosphatase: 96 U/L (ref 38–126)
Anion gap: 9 (ref 5–15)
BUN: 18 mg/dL (ref 8–23)
CO2: 26 mmol/L (ref 22–32)
Calcium: 9 mg/dL (ref 8.9–10.3)
Chloride: 106 mmol/L (ref 98–111)
Creatinine, Ser: 0.94 mg/dL (ref 0.44–1.00)
GFR calc Af Amer: >60 mL/min (ref >60)
GFR calc non Af Amer: >60 mL/min (ref >60)
Glucose, Bld: 110 mg/dL — ABNORMAL HIGH (ref 70–99)
Potassium: 4 mmol/L (ref 3.5–5.1)
Sodium: 141 mmol/L (ref 135–145)
Total Bilirubin: 0.5 mg/dL (ref 0.3–1.2)
Total Protein: 6.8 g/dL (ref 6.5–8.1)

## 2019-04-05 NOTE — Progress Notes (Signed)
PCP:  Lelon Huh, MD Cardiologist:  denies  EKG:  04/05/19 CXR:  N/A ECHO:  denies Stress Test:  denies Cardiac Cath:  Denies  Covid testing today, 04/05/19  Patient denies shortness of breath, fever, cough, and chest pain at PAT appointment.  Patient verbalized understanding of instructions provided today at the PAT appointment.  Patient asked to review instructions at home and day of surgery.

## 2019-04-05 NOTE — Progress Notes (Signed)
[facility] - [place], [place] - [address] [place] [address] Phone: [phone] Fax: [phone]      Your procedure is scheduled on Friday, Oct. 16th.  Report to [facility] Main Entrance "A" at 5:30 A.M., and check in at the Admitting office.  Call this number if you have problems the morning of surgery:  [phone]  Call [phone] if you have any questions prior to your surgery date Monday-Friday 8am-4pm    Remember:  Do not eat or drink after midnight the night before your surgery    Take these medicines the morning of surgery with A SIP OF WATER    Bupropion (Wellbutrin)  Diazepam (Valium)  Flonase nasal spray - if needed  Omeprazole (Prilosec)  Oxycodone - if needed  Pregabalin (Lyrica)  Eye Drops - if needed  Rosuvastatin (Crestor)  Follow your surgeon's instructions on when to stop Aspirin.  If no instructions were given by your surgeon then you will need to call the office to get those instructions.    7 days prior to surgery STOP taking any Aspirin (unless otherwise instructed by your surgeon), Aleve, Naproxen, Ibuprofen, Motrin, Advil, Goody's, BC's, all herbal medications, fish oil, and all vitamins.    The Morning of Surgery  Do not wear jewelry, make-up or nail polish.  Do not wear lotions, powders, or perfumes, or deodorant  Do not shave 48 hours prior to surgery.    Do not bring valuables to the hospital.  [facility] is not responsible for any belongings or valuables.  If you are a smoker, DO NOT Smoke 24 hours prior to surgery IF you wear a CPAP at night please bring your mask, tubing, and machine the morning of surgery   Remember that you must have someone to transport you home after your surgery, and remain with you for 24 hours if you are discharged the same day.   Contacts, glasses, hearing aids, dentures or bridgework may not be worn into surgery.    Leave your suitcase in the car.  After surgery it  may be brought to your room.  For patients admitted to the hospital, discharge time will be determined by your treatment team.  Patients discharged the day of surgery will not be allowed to drive home.    Special instructions:   [facility]- Preparing For Surgery  Before surgery, you can play an important role. Because skin is not sterile, your skin needs to be as free of germs as possible. You can reduce the number of germs on your skin by washing with CHG (chlorahexidine gluconate) Soap before surgery.  CHG is an antiseptic cleaner which kills germs and bonds with the skin to continue killing germs even after washing.    Oral Hygiene is also important to reduce your risk of infection.  Remember - BRUSH YOUR TEETH THE MORNING OF SURGERY WITH YOUR REGULAR TOOTHPASTE  Please do not use if you have an allergy to CHG or antibacterial soaps. If your skin becomes reddened/irritated stop using the CHG.  Do not shave (including legs and underarms) for at least 48 hours prior to first CHG shower. It is OK to shave your face.  Please follow these instructions carefully.   1. Shower the NIGHT BEFORE SURGERY and the MORNING OF SURGERY with CHG Soap.   2. If you chose to wash your hair, wash your hair first as usual with your normal shampoo.  3. After you shampoo, rinse your hair  and body thoroughly to remove the shampoo.  4. Use CHG as you would any other liquid soap. You can apply CHG directly to the skin and wash gently with a scrungie or a clean washcloth.   5. Apply the CHG Soap to your body ONLY FROM THE NECK DOWN.  Do not use on open wounds or open sores. Avoid contact with your eyes, ears, mouth and genitals (private parts). Wash Face and genitals (private parts)  with your normal soap.   6. Wash thoroughly, paying special attention to the area where your surgery will be performed.  7. Thoroughly rinse your body with warm water from the neck down.  8. DO NOT shower/wash with your normal  soap after using and rinsing off the CHG Soap.  9. Pat yourself dry with a CLEAN TOWEL.  10. Wear CLEAN PAJAMAS to bed the night before surgery, wear comfortable clothes the morning of surgery  11. Place CLEAN SHEETS on your bed the night of your first shower and DO NOT SLEEP WITH PETS.    Day of Surgery:  Do not apply any deodorants/lotions. Please shower the morning of surgery with the CHG soap  Please wear clean clothes to the hospital/surgery center.   Remember to brush your teeth WITH YOUR REGULAR TOOTHPASTE.   Please read over the following fact sheets that you were given.

## 2019-04-06 LAB — NOVEL CORONAVIRUS, NAA (HOSP ORDER, SEND-OUT TO REF LAB; TAT 18-24 HRS): SARS-CoV-2, NAA: NOT DETECTED

## 2019-04-06 NOTE — Progress Notes (Signed)
Anesthesia: For spinal cord stimulator battery exchange by Dr. Maryjean Ka on 04/08/19. Med list includes phentermine. I spoke with patient, she takes only intermittently in hopes to maintain her weight since weight loss. She has not taken for > 1 month. Advised to continue to hold until after surgery. She reports ASA on hold for surgery. Anesthesia team to evaluate on the day of surgery. EKG NSR. Preoperative labs acceptable.   Myra Gianotti, PA-C Surgical Short Stay/Anesthesiology [facility] Phone [phone] [facility] Phone [phone] 04/06/2019 1:03 PM

## 2019-04-07 ENCOUNTER — Other Ambulatory Visit: Payer: Self-pay | Admitting: Family Medicine

## 2019-04-07 NOTE — Anesthesia Preprocedure Evaluation (Addendum)
Anesthesia Evaluation  Patient identified by MRN, date of birth, ID band Patient awake    Reviewed: Allergy & Precautions, NPO status , Patient's Chart, lab work & pertinent test results  History of Anesthesia Complications Negative for: history of anesthetic complications  Airway Mallampati: II  TM Distance: >3 FB Neck ROM: Full    Dental  (+) Upper Dentures, Partial Lower   Pulmonary neg pulmonary ROS,    Pulmonary exam normal        Cardiovascular hypertension, negative cardio ROS Normal cardiovascular exam     Neuro/Psych  Headaches, PSYCHIATRIC DISORDERS Anxiety Depression    GI/Hepatic Neg liver ROS, GERD  ,  Endo/Other  Hypothyroidism   Renal/GU negative Renal ROS  negative genitourinary   Musculoskeletal negative musculoskeletal ROS (+)   Abdominal   Peds  Hematology negative hematology ROS (+)   Anesthesia Other Findings Failed back surgery syndrome s/p SCS  Reproductive/Obstetrics                            Anesthesia Physical Anesthesia Plan  ASA: II  Anesthesia Plan: General   Post-op Pain Management:    Induction: Intravenous  PONV Risk Score and Plan: 3 and Ondansetron, Dexamethasone, Treatment may vary due to age or medical condition and Midazolam  Airway Management Planned: LMA  Additional Equipment: None  Intra-op Plan:   Post-operative Plan: Extubation in OR  Informed Consent: I have reviewed the patients History and Physical, chart, labs and discussed the procedure including the risks, benefits and alternatives for the proposed anesthesia with the patient or authorized representative who has indicated his/her understanding and acceptance.     Dental advisory given  Plan Discussed with:   Anesthesia Plan Comments:        Anesthesia Quick Evaluation

## 2019-04-07 NOTE — H&P (Signed)
Courtney Ross is an 63 y.o. female.   Chief Complaint: "My stimulator battery isn't working" HPI:  This is a pleasant 63 year old female patient former patient of Dr. Glenna Fellows who has undergone multiple spinal surgeries who was referred to [facility] today for evaluation of her SCS battery that is in need of replacement.  Her [employer] system was implanted in 2011 by Dr. Dossie Arbour.   Patient reports the stimulator was working quite well for her despite one of her leads that was determined not to be functioning.  She states she has been able to meet with [employer] representative in the past and has been able to get proper coverage for the patient.  She is referred here specifically for replacement battery surgery.       Past Medical History:  Diagnosis Date  . Anxiety   . Back pain   . Colon polyp   . Constipation   . Degenerative disc disease, lumbar   . Depression   . Family history of adverse reaction to anesthesia    daughter nausea  . Fatty liver   . GERD (gastroesophageal reflux disease)   . H/O spinal fusion   . Headache    migraines  . Headache, migraine 05/08/2015  . High cholesterol   . Hypertension   . Lower back pain   . Polycystic ovarian disease   . PONV (postoperative nausea and vomiting)    due to surgery from the 1990's  . Restless leg syndrome   . Spasm 01/29/2009    Past Surgical History:  Procedure Laterality Date  . [place]CT[place]  . APPENDECTOMY  1988  . ARTHRODESIS METATARSALPHALANGEAL JOINT (MTPJ) Left 12/14/2014   Procedure: ARTHRODESIS METATARSALPHALANGEAL JOINT (MTPJ) 1ST;  Surgeon: Albertine Patricia, DPM;  Location: [place];  Service: Podiatry;  Laterality: Left;  LMA  . back fusion  2004   lumbar region. had broken facet joint.   Marland Kitchen BACK SURGERY  [phone]   x3  . BLADDER SUSPENSION  2006  . BREAST BIOPSY Left 1990's   core [place]  . BREAST SURGERY  2005   lumpectomy  . CARPAL TUNNEL  RELEASE Bilateral 02/26/07,05/21/07  . CHOLECYSTECTOMY N/A 05/03/2018   Procedure: LAPAROSCOPIC CHOLECYSTECTOMY WITH INTRAOPERATIVE CHOLANGIOGRAM;  Surgeon: Robert Bellow, MD;  Location: [facility];  Service: General;  Laterality: N/A;  . COLONOSCOPY    . COLPORRHAPHY    . DILATION AND CURETTAGE OF UTERUS    . [place]  . FOOT SURGERY Left 2017   fusion on big toe, hammer toe, bunions  . HAMMER TOE SURGERY Left 03/22/2015   Procedure: HAMMER TOE CORRECTION L 2ND AND 3RD ;  Surgeon: Albertine Patricia, DPM;  Location: [place];  Service: Podiatry;  Laterality: Left;  WITH LOCAL  . HERNIA REPAIR Right 1988   inguinal  . LAMINECTOMY  1993, 1994  . NECK SURGERY  2006   fusion  . RECTOCELE REPAIR N/A 08/25/2016   Procedure: POSTERIOR REPAIR (RECTOCELE);  Surgeon: Brayton Mars, MD;  Location: [facility];  Service: Gynecology;  Laterality: N/A;  . SPINAL CORD STIMULATOR IMPLANT  2014   stimulator in the ileo inguinal nerve due to scar tissue in abdomen  . TONSILLECTOMY  1985  . VENTRAL HERNIA REPAIR N/A 09/01/2018   6.4 cm Ventra lite mesh. HERNIA REPAIR VENTRAL ADULT;  Surgeon: Robert Bellow, MD;  Location: [facility];  Service: General;  Laterality: N/A;  Family History  Problem Relation Age of Onset  . Diabetes Father   . Hypertension Father   . CAD Father   . Diabetes Sister        non-Insulin dependent diabetes mellitus  . Heart disease Brother   . Cancer Daughter        cancer of the cervix at age 65-18  . Heart disease Brother   . Breast cancer Paternal Aunt   . Colon cancer Paternal Uncle   . Ovarian cancer Neg Hx    Social History:  reports that she has never smoked. She has never used smokeless tobacco. She reports that she does not drink alcohol or use drugs.  Allergies:  Allergies  Allergen Reactions  . Hydrocodone Itching    Even in cough syrup causes all body itching  . Lisinopril Cough  . Mirtazapine     Weight gain  .  Other Nausea And Vomiting    general anesthesia from 1990    Medications Prior to Admission  Medication Sig Dispense Refill  . AMITIZA 24 MCG capsule TAKE 1 CAPSULE BY MOUTH TWICE DAILY WITHA MEAL (Patient taking differently: Take 24 mcg by mouth daily with breakfast. ) 30 capsule 11  . aspirin EC 81 MG tablet Take 81 mg by mouth at bedtime.    Marland Kitchen buPROPion (WELLBUTRIN SR) 150 MG 12 hr tablet Take 1 tablet (150 mg total) by mouth 3 (three) times daily. 90 tablet 3  . Cholecalciferol (VITAMIN D3) 5000 UNITS CAPS Take 5,000 Units by mouth daily.     . cyanocobalamin (,VITAMIN B-12,) 1000 MCG/ML injection Inject 1,000 mcg into the muscle every 30 (thirty) days.    . cyclobenzaprine (FLEXERIL) 10 MG tablet Take 1 tablet (10 mg total) by mouth 3 (three) times daily as needed for muscle spasms. 90 tablet 5  . diazepam (VALIUM) 10 MG tablet Take 1 tablet (10 mg total) by mouth every 6 (six) hours as needed. for anxiety 360 tablet 1  . estradiol (ESTRACE) 1 MG tablet Take 1 tablet (1 mg total) by mouth daily. 90 tablet 4  . fluticasone (FLONASE) 50 MCG/ACT nasal spray Place 1 spray into both nostrils daily as needed for allergies or rhinitis.    Marland Kitchen LINZESS 145 MCG CAPS capsule Take 145 mcg by mouth daily before breakfast.     . meloxicam (MOBIC) 15 MG tablet Take 15 mg by mouth at bedtime.     . Multiple Vitamin (MULTIVITAMIN WITH MINERALS) TABS tablet Take 1 tablet by mouth daily.    Marland Kitchen omeprazole (PRILOSEC) 40 MG capsule Take 1 capsule (40 mg total) by mouth daily. (Patient taking differently: Take 40 mg by mouth daily as needed (heartburn). ) 30 capsule 1  . oxyCODONE (ROXICODONE) 15 MG immediate release tablet Take 1 tablet (15 mg total) by mouth every 4 (four) hours as needed for pain. (Patient taking differently: Take 15-30 mg by mouth every 4 (four) hours as needed for pain. ) 180 tablet 0  . phentermine (ADIPEX-P) 37.5 MG tablet Take 1 tablet (37.5 mg total) by mouth daily before breakfast. (Patient  taking differently: Take 37.5 mg by mouth once a week. ) 30 tablet 0  . polyethylene glycol (MIRALAX / GLYCOLAX) packet Take 17 g by mouth 2 (two) times daily as needed for mild constipation (dissolve in an 8 oz glass of water). 14 each 1  . pregabalin (LYRICA) 75 MG capsule TAKE 1 CAPSULE BY MOUTH 3 TIMES DAILY (Patient taking differently: Take 75 mg by mouth  3 (three) times daily. ) 90 capsule 5  . RESTASIS 0.05 % ophthalmic emulsion Place 1 drop into both eyes 2 (two) times daily.     Marland Kitchen rOPINIRole (REQUIP) 2 MG tablet Take 1 tablet (2 mg total) by mouth at bedtime. 90 tablet 3  . rosuvastatin (CRESTOR) 10 MG tablet Take 1 tablet (10 mg total) by mouth daily. 90 tablet 4  . mupirocin cream (BACTROBAN) 2 % Apply to affected aread twice daily as needed. (Patient not taking: Reported on 03/31/2019) 15 g 2  . MYRBETRIQ 25 MG TB24 tablet TAKE ONE TABLET BY MOUTH EVERY DAY (Patient not taking: Reported on 02/17/2019) 90 tablet 1    Results for orders placed or performed during the hospital encounter of 04/05/19 (from the past 48 hour(s))  Novel Coronavirus, NAA (Hosp order, Send-out to Ref Lab; TAT 18-24 hrs     Status: None   Collection Time: 04/05/19  1:51 PM   Specimen: Nasopharyngeal Swab; Respiratory  Result Value Ref Range   SARS-CoV-2, NAA NOT DETECTED NOT DETECTED    Comment: (NOTE) This nucleic acid amplification test was developed and its performance characteristics determined by [employer]. Nucleic acid amplification tests include PCR and TMA. This test has not been FDA cleared or approved. This test has been authorized by FDA under an Emergency Use Authorization (EUA). This test is only authorized for the duration of time the declaration that circumstances exist justifying the authorization of the emergency use of in vitro diagnostic tests for detection of SARS-CoV-2 virus and/or diagnosis of COVID-19 infection under section 564(b)(1) of the Act, 21 U.S.C. PT:2852782) (1),  unless the authorization is terminated or revoked sooner. When diagnostic testing is negative, the possibility of a false negative result should be considered in the context of a patient's recent exposures and the presence of clinical signs and symptoms consistent with COVID-19. An individual without symptoms of COVID- 19 and who is not shedding SARS-CoV-2 vi rus would expect to have a negative (not detected) result in this assay. Performed At: [facility] [place], [place] [id number] Rush Farmer MD [id number]    Coronavirus Source NASOPHARYNGEAL     Comment: Performed at [facility], [place] [address] [place], [place] [address]   No results found.  Review of Systems  Constitutional: Negative.   HENT: Negative.   Eyes: Negative.   Respiratory: Negative.   Cardiovascular: Negative.   Gastrointestinal: Negative.   Genitourinary: Negative.   Musculoskeletal: Negative.   Skin: Negative.   Neurological: Negative.   Endo/Heme/Allergies: Negative.   Psychiatric/Behavioral: Negative.     Blood pressure 136/76, pulse 63, temperature 98 F (36.7 C), temperature source Oral, resp. rate 18, height 5\' 8"  (1.727 m), weight 78 kg, SpO2 100 %. Physical Exam  Constitutional: She is oriented to person, place, and time. She appears well-developed and well-nourished.  HENT:  Head: Normocephalic and atraumatic.  Eyes: Pupils are equal, round, and reactive to light. EOM are normal.  Neck: Normal range of motion.  Cardiovascular: Normal rate.  Respiratory: Effort normal.  GI: Soft.  Musculoskeletal: Normal range of motion.  Neurological: She is alert and oriented to person, place, and time.  Skin: Skin is warm and dry.  Psychiatric: She has a normal mood and affect. Her behavior is normal. Thought content normal.     Assessment/Plan Lumbar postlaminectomy syndrome Failed SCS IPG PLAN: replace Medtronic IPG  Bonna Gains, MD 04/08/2019, 7:20  AM

## 2019-04-08 ENCOUNTER — Ambulatory Visit (HOSPITAL_COMMUNITY): Payer: Medicare Other | Admitting: Vascular Surgery

## 2019-04-08 ENCOUNTER — Ambulatory Visit (HOSPITAL_COMMUNITY)
Admission: RE | Admit: 2019-04-08 | Discharge: 2019-04-08 | Disposition: A | Discharge status: Home / Self Care | Payer: Medicare Other | Attending: Anesthesiology | Admitting: Anesthesiology

## 2019-04-08 ENCOUNTER — Encounter (HOSPITAL_COMMUNITY)
Admission: RE | Disposition: A | Discharge status: Home / Self Care | Payer: Self-pay | Source: Home / Self Care | Attending: Anesthesiology

## 2019-04-08 ENCOUNTER — Other Ambulatory Visit: Payer: Self-pay

## 2019-04-08 ENCOUNTER — Encounter (HOSPITAL_COMMUNITY): Payer: Self-pay | Admitting: Anesthesiology

## 2019-04-08 ENCOUNTER — Ambulatory Visit (HOSPITAL_COMMUNITY): Payer: Medicare Other | Admitting: Anesthesiology

## 2019-04-08 DIAGNOSIS — F419 Anxiety disorder, unspecified: Secondary | ICD-10-CM | POA: Diagnosis not present

## 2019-04-08 DIAGNOSIS — I10 Essential (primary) hypertension: Secondary | ICD-10-CM | POA: Insufficient documentation

## 2019-04-08 DIAGNOSIS — K219 Gastro-esophageal reflux disease without esophagitis: Secondary | ICD-10-CM | POA: Diagnosis not present

## 2019-04-08 DIAGNOSIS — Z8249 Family history of ischemic heart disease and other diseases of the circulatory system: Secondary | ICD-10-CM | POA: Insufficient documentation

## 2019-04-08 DIAGNOSIS — E785 Hyperlipidemia, unspecified: Secondary | ICD-10-CM | POA: Diagnosis not present

## 2019-04-08 DIAGNOSIS — Z885 Allergy status to narcotic agent status: Secondary | ICD-10-CM | POA: Diagnosis not present

## 2019-04-08 DIAGNOSIS — Z7982 Long term (current) use of aspirin: Secondary | ICD-10-CM | POA: Diagnosis not present

## 2019-04-08 DIAGNOSIS — E039 Hypothyroidism, unspecified: Secondary | ICD-10-CM | POA: Diagnosis not present

## 2019-04-08 DIAGNOSIS — M961 Postlaminectomy syndrome, not elsewhere classified: Secondary | ICD-10-CM | POA: Diagnosis not present

## 2019-04-08 DIAGNOSIS — Z981 Arthrodesis status: Secondary | ICD-10-CM | POA: Insufficient documentation

## 2019-04-08 DIAGNOSIS — Z4542 Encounter for adjustment and management of neuropacemaker (brain) (peripheral nerve) (spinal cord): Secondary | ICD-10-CM | POA: Diagnosis not present

## 2019-04-08 DIAGNOSIS — G894 Chronic pain syndrome: Secondary | ICD-10-CM | POA: Diagnosis not present

## 2019-04-08 DIAGNOSIS — T85113A Breakdown (mechanical) of implanted electronic neurostimulator, generator, initial encounter: Secondary | ICD-10-CM | POA: Diagnosis not present

## 2019-04-08 DIAGNOSIS — Z888 Allergy status to other drugs, medicaments and biological substances status: Secondary | ICD-10-CM | POA: Diagnosis not present

## 2019-04-08 DIAGNOSIS — F329 Major depressive disorder, single episode, unspecified: Secondary | ICD-10-CM | POA: Insufficient documentation

## 2019-04-08 DIAGNOSIS — Z791 Long term (current) use of non-steroidal anti-inflammatories (NSAID): Secondary | ICD-10-CM | POA: Diagnosis not present

## 2019-04-08 DIAGNOSIS — E7800 Pure hypercholesterolemia, unspecified: Secondary | ICD-10-CM | POA: Diagnosis not present

## 2019-04-08 DIAGNOSIS — Z79899 Other long term (current) drug therapy: Secondary | ICD-10-CM | POA: Diagnosis not present

## 2019-04-08 HISTORY — PX: SPINAL CORD STIMULATOR BATTERY EXCHANGE: SHX6202

## 2019-04-08 SURGERY — SPINAL CORD STIMULATOR BATTERY EXCHANGE
Anesthesia: General | Site: Abdomen | Laterality: Right

## 2019-04-08 MED ORDER — ONDANSETRON HCL 4 MG/2ML IJ SOLN: 4.0000 mg | Freq: Once | INTRAMUSCULAR | Status: DC | PRN

## 2019-04-08 MED ORDER — OXYCODONE HCL 5 MG PO TABS
ORAL_TABLET | ORAL | Status: AC
  Filled 2019-04-08: qty 1

## 2019-04-08 MED ORDER — PROPOFOL 10 MG/ML IV BOLUS
INTRAVENOUS | Status: DC | PRN
  Administered 2019-04-08: 150 mg via INTRAVENOUS

## 2019-04-08 MED ORDER — PROPOFOL 10 MG/ML IV BOLUS
INTRAVENOUS | Status: AC
  Filled 2019-04-08: qty 20

## 2019-04-08 MED ORDER — FENTANYL CITRATE (PF) 100 MCG/2ML IJ SOLN
INTRAMUSCULAR | Status: DC | PRN
  Administered 2019-04-08: 50 ug via INTRAVENOUS

## 2019-04-08 MED ORDER — LIDOCAINE 2% (20 MG/ML) 5 ML SYRINGE
INTRAMUSCULAR | Status: DC | PRN
  Administered 2019-04-08: 60 mg via INTRAVENOUS

## 2019-04-08 MED ORDER — CHLORHEXIDINE GLUCONATE CLOTH 2 % EX PADS: 6.0000 | MEDICATED_PAD | Freq: Once | CUTANEOUS | Status: DC

## 2019-04-08 MED ORDER — OXYCODONE HCL 5 MG PO TABS
5.0000 mg | ORAL_TABLET | Freq: Once | ORAL | Status: AC | PRN
  Administered 2019-04-08: 5 mg via ORAL

## 2019-04-08 MED ORDER — 0.9 % SODIUM CHLORIDE (POUR BTL) OPTIME
TOPICAL | Status: DC | PRN
  Administered 2019-04-08: 1000 mL

## 2019-04-08 MED ORDER — CEFAZOLIN SODIUM-DEXTROSE 2-4 GM/100ML-% IV SOLN
2.0000 g | INTRAVENOUS | Status: AC
  Administered 2019-04-08: 2 g via INTRAVENOUS

## 2019-04-08 MED ORDER — LIDOCAINE-EPINEPHRINE 1 %-1:100000 IJ SOLN
INTRAMUSCULAR | Status: AC
  Filled 2019-04-08: qty 1

## 2019-04-08 MED ORDER — MIDAZOLAM HCL 2 MG/2ML IJ SOLN
INTRAMUSCULAR | Status: AC
  Filled 2019-04-08: qty 2

## 2019-04-08 MED ORDER — OXYCODONE HCL 5 MG/5ML PO SOLN: 5.0000 mg | Freq: Once | ORAL | Status: AC | PRN

## 2019-04-08 MED ORDER — LIDOCAINE-EPINEPHRINE 1 %-1:100000 IJ SOLN
INTRAMUSCULAR | Status: DC | PRN
  Administered 2019-04-08: 10 mL

## 2019-04-08 MED ORDER — FENTANYL CITRATE (PF) 250 MCG/5ML IJ SOLN
INTRAMUSCULAR | Status: AC
  Filled 2019-04-08: qty 5

## 2019-04-08 MED ORDER — DEXAMETHASONE SODIUM PHOSPHATE 10 MG/ML IJ SOLN
INTRAMUSCULAR | Status: DC | PRN
  Administered 2019-04-08: 10 mg via INTRAVENOUS

## 2019-04-08 MED ORDER — PHENYLEPHRINE HCL (PRESSORS) 10 MG/ML IV SOLN
INTRAVENOUS | Status: DC | PRN
  Administered 2019-04-08 (×2): 80 ug via INTRAVENOUS

## 2019-04-08 MED ORDER — CEFAZOLIN SODIUM-DEXTROSE 2-4 GM/100ML-% IV SOLN
INTRAVENOUS | Status: AC
  Filled 2019-04-08: qty 100

## 2019-04-08 MED ORDER — FENTANYL CITRATE (PF) 100 MCG/2ML IJ SOLN: 25.0000 ug | INTRAMUSCULAR | Status: DC | PRN

## 2019-04-08 MED ORDER — LACTATED RINGERS IV SOLN
INTRAVENOUS | Status: DC | PRN
  Administered 2019-04-08 (×2): via INTRAVENOUS

## 2019-04-08 MED ORDER — ONDANSETRON HCL 4 MG/2ML IJ SOLN
INTRAMUSCULAR | Status: DC | PRN
  Administered 2019-04-08: 4 mg via INTRAVENOUS

## 2019-04-08 MED ORDER — MIDAZOLAM HCL 5 MG/5ML IJ SOLN
INTRAMUSCULAR | Status: DC | PRN
  Administered 2019-04-08: 2 mg via INTRAVENOUS

## 2019-04-08 SURGICAL SUPPLY — 38 items
ADH SKN CLS APL DERMABOND .7 (GAUZE/BANDAGES/DRESSINGS) ×1
APL PRP STRL LF DISP 70% ISPRP (MISCELLANEOUS) ×1
BINDER ABDOMINAL 12 ML 46-62 (SOFTGOODS) ×2 IMPLANT
BLADE CLIPPER SURG (BLADE) IMPLANT
CHLORAPREP W/TINT 26 (MISCELLANEOUS) ×2 IMPLANT
COVER WAND RF STERILE (DRAPES) ×2 IMPLANT
DERMABOND ADVANCED (GAUZE/BANDAGES/DRESSINGS) ×1
DERMABOND ADVANCED .7 DNX12 (GAUZE/BANDAGES/DRESSINGS) ×1 IMPLANT
DEVICE IMPLANT NEUROSTIMULATOR (Neuro Prosthesis/Implant) ×1 IMPLANT
DRAPE LAPAROTOMY 100X72X124 (DRAPES) ×2 IMPLANT
DRSG OPSITE POSTOP 4X6 (GAUZE/BANDAGES/DRESSINGS) ×1 IMPLANT
ENVELOPE ABSORB ANTIBACTERIAL (Neuro Prosthesis/Implant) ×2 IMPLANT
GLOVE BIOGEL PI IND STRL 7.5 (GLOVE) ×1 IMPLANT
GLOVE BIOGEL PI INDICATOR 7.5 (GLOVE) ×1
GLOVE ECLIPSE 7.5 STRL STRAW (GLOVE) ×2 IMPLANT
GLOVE EXAM NITRILE LRG STRL (GLOVE) IMPLANT
GLOVE EXAM NITRILE XL STR (GLOVE) IMPLANT
GLOVE EXAM NITRILE XS STR PU (GLOVE) IMPLANT
GOWN STRL REUS W/ TWL LRG LVL3 (GOWN DISPOSABLE) IMPLANT
GOWN STRL REUS W/TWL LRG LVL3 (GOWN DISPOSABLE)
KIT BASIN OR (CUSTOM PROCEDURE TRAY) ×2 IMPLANT
KIT TURNOVER KIT B (KITS) ×2 IMPLANT
NDL HYPO 25X1 1.5 SAFETY (NEEDLE) ×1 IMPLANT
NEEDLE HYPO 25X1 1.5 SAFETY (NEEDLE) ×2 IMPLANT
PACK LAMINECTOMY NEURO (CUSTOM PROCEDURE TRAY) ×2 IMPLANT
PAD ARMBOARD 7.5X6 YLW CONV (MISCELLANEOUS) ×2 IMPLANT
POUCH TYRX ANTIBAC NEURO MED (Neuro Prosthesis/Implant) IMPLANT
RECHARGER INTELLIS (NEUROSURGERY SUPPLIES) ×1 IMPLANT
REPROGRAMMER INTELLIS (NEUROSURGERY SUPPLIES) ×1 IMPLANT
SPONGE LAP 4X18 RFD (DISPOSABLE) IMPLANT
SUT MNCRL AB 4-0 PS2 18 (SUTURE) ×2 IMPLANT
SUT SILK 2 0 PERMA HAND 18 BK (SUTURE) IMPLANT
SUT VIC AB 2-0 CP2 18 (SUTURE) ×3 IMPLANT
SYR 10ML LL (SYRINGE) IMPLANT
SYR EPIDURAL 5ML GLASS (SYRINGE) IMPLANT
TOWEL GREEN STERILE (TOWEL DISPOSABLE) ×2 IMPLANT
TOWEL GREEN STERILE FF (TOWEL DISPOSABLE) ×2 IMPLANT
WATER STERILE IRR 1000ML POUR (IV SOLUTION) ×2 IMPLANT

## 2019-04-08 NOTE — Transfer of Care (Signed)
Immediate Anesthesia Transfer of Care Note  Patient: DEZTINI HENES  Procedure(s) Performed: SPINAL CORD STIMULATOR BATTERY EXCHANGE (Right Abdomen)  Patient Location: PACU  Anesthesia Type:General  Level of Consciousness: awake, alert  and oriented  Airway & Oxygen Therapy: Patient Spontanous Breathing and Patient connected to nasal cannula oxygen  Post-op Assessment: Report given to RN, Post -op Vital signs reviewed and stable and Patient moving all extremities X 4  Post vital signs: Reviewed and stable  Last Vitals:  Vitals Value Taken Time  BP 119/78 04/08/19 0826  Temp    Pulse 76 04/08/19 0827  Resp 15 04/08/19 0827  SpO2 100 % 04/08/19 0827  Vitals shown include unvalidated device data.  Last Pain:  Vitals:   04/08/19 0652  TempSrc: Oral  PainSc: 0-No pain         Complications: No apparent anesthesia complications

## 2019-04-08 NOTE — Anesthesia Postprocedure Evaluation (Signed)
Anesthesia Post Note  Patient: Courtney Ross  Procedure(s) Performed: SPINAL CORD STIMULATOR BATTERY EXCHANGE (Right Abdomen)     Patient location during evaluation: PACU Anesthesia Type: General Level of consciousness: awake and alert Pain management: pain level controlled Vital Signs Assessment: post-procedure vital signs reviewed and stable Respiratory status: spontaneous breathing, nonlabored ventilation and respiratory function stable Cardiovascular status: blood pressure returned to baseline and stable Postop Assessment: no apparent nausea or vomiting Anesthetic complications: no    Last Vitals:  Vitals:   04/08/19 0856 04/08/19 0900  BP: 121/78   Pulse: 66 69  Resp: 18 17  Temp:    SpO2: 100% 100%    Last Pain:  Vitals:   04/08/19 0826  TempSrc:   PainSc: 0-No pain                 Lidia Collum

## 2019-04-08 NOTE — Anesthesia Procedure Notes (Signed)
Procedure Name: LMA Insertion Date/Time: 04/08/2019 7:34 AM Performed by: Neldon Newport, CRNA Pre-anesthesia Checklist: Timeout performed, Patient being monitored, Suction available, Emergency Drugs available and Patient identified Patient Re-evaluated:Patient Re-evaluated prior to induction Oxygen Delivery Method: Circle system utilized Preoxygenation: Pre-oxygenation with 100% oxygen Induction Type: IV induction Ventilation: Mask ventilation without difficulty LMA Size: 4.0 Tube type: Oral Number of attempts: 1 Placement Confirmation: breath sounds checked- equal and bilateral and positive ETCO2 Tube secured with: Tape Dental Injury: Teeth and Oropharynx as per pre-operative assessment

## 2019-04-08 NOTE — Op Note (Signed)
PREOP DX: 1) lumbar post-laminectomy syndrome  2) failed IPG (Medtronic) 3) chronic pain  POSTOP DX: same as preop PROCEDURES PERFORMED:1) IPG replacement SURGEON:Rumeal Cullipher  ASSISTANT: NONE  ANESTHESIA: General, LMA EBL: <10cc  DESCRIPTION OF PROCEDURE: After a discussion of risks, benefits and alternatives, informed consent was obtained. The patient was taken to the OR, general anesthesia induced by the anesthesia team without difficulty, placed supine onto a Jackson table, all pressure points padded, SCD's placed. A timeout was taken to verify the correct patient, position, personnel, availability of appropriate equipment, and administration of perioperative antibiotics.  The previous IPG site (abdominal) was marked. The skin and subcutaneous tissues around the patient's previous pocket incision was infiltrated with 0.25% bupivicaine 1:200K epinephrine. The subcutaneous pocket was incised with a 10 blade and using sharp, careful dissection the pocket opened and the IPG delivered onto the field. The pocket was inspected for hemostasis, which was found to be excellent. The lead was removed from the IPG, and replaced the lead in new Intellis battery, which fit into the old pocket well. Retention sutures were placed and used to fix the battery in the pocket. The  incision was copiously irrigated with bacitracin-containing irrigation. An antibiotic eluting patch was placed posterior to the battery. The pocket incision was closed with a deeper layer of 2-0 vicryl interrupted sutures, and the skin closed with a running 3-0 subcuticular monocryl suture and dermabond. Sterile dressings were applied. Needle, sponge, and instrument counts were correct x2 at the end of the case.  The patient was then carefully awakened from anesthesia, an abdominal binder placed, and the patient taken to the recovery room. COMPLICATIONS: NONE  CONDITION: Stable throughout the course of the procedure and immediately  afterward  DISPOSITION: discharge to home. Discussed care with family. Followup in clinic will be scheduled in 10-14 days.

## 2019-04-11 ENCOUNTER — Encounter (HOSPITAL_COMMUNITY): Payer: Self-pay | Admitting: Anesthesiology

## 2019-04-14 ENCOUNTER — Encounter: Payer: Self-pay | Admitting: Family Medicine

## 2019-04-19 NOTE — Telephone Encounter (Signed)
Written prescription sent to medical records to fax.

## 2019-04-22 ENCOUNTER — Other Ambulatory Visit: Payer: Self-pay | Admitting: Family Medicine

## 2019-04-22 NOTE — Telephone Encounter (Signed)
Pt needing a refill on: ° °oxyCODONE (ROXICODONE) 15 MG immediate release tablet ° °Please fill at: ° °[facility] - [place], [place] - [address] [phone] (Phone) °[phone] (Fax)  ° ° °Thanks, °[facility] ° °

## 2019-04-22 NOTE — Telephone Encounter (Signed)
Last OV was on 07/07/2018 and last RF 03/22/2019

## 2019-04-23 MED ORDER — OXYCODONE HCL 15 MG PO TABS: 15.0000 mg | ORAL_TABLET | ORAL | 0 refills | Status: DC | PRN

## 2019-04-26 DIAGNOSIS — R030 Elevated blood-pressure reading, without diagnosis of hypertension: Secondary | ICD-10-CM | POA: Diagnosis not present

## 2019-05-05 ENCOUNTER — Encounter: Payer: Medicare Other | Admitting: Obstetrics and Gynecology

## 2019-05-05 DIAGNOSIS — H2513 Age-related nuclear cataract, bilateral: Secondary | ICD-10-CM | POA: Diagnosis not present

## 2019-05-17 DIAGNOSIS — M47816 Spondylosis without myelopathy or radiculopathy, lumbar region: Secondary | ICD-10-CM | POA: Diagnosis not present

## 2019-05-24 ENCOUNTER — Other Ambulatory Visit: Payer: Self-pay | Admitting: Family Medicine

## 2019-05-24 DIAGNOSIS — M5412 Radiculopathy, cervical region: Secondary | ICD-10-CM | POA: Diagnosis not present

## 2019-05-24 DIAGNOSIS — M419 Scoliosis, unspecified: Secondary | ICD-10-CM | POA: Diagnosis not present

## 2019-05-24 DIAGNOSIS — M47816 Spondylosis without myelopathy or radiculopathy, lumbar region: Secondary | ICD-10-CM | POA: Diagnosis not present

## 2019-05-24 MED ORDER — OXYCODONE HCL 15 MG PO TABS: 15.0000 mg | ORAL_TABLET | ORAL | 0 refills | Status: DC | PRN

## 2019-05-24 NOTE — Telephone Encounter (Signed)
Last OV 07/07/2018 Last RF 04/23/2019

## 2019-05-24 NOTE — Telephone Encounter (Signed)
From [facility] 

## 2019-05-24 NOTE — Telephone Encounter (Signed)
Pt is requesting a refill for oxyCODONE (ROXICODONE) 15 MG immediate release tablet    Pharmacy: [place], [place]

## 2019-06-02 ENCOUNTER — Other Ambulatory Visit: Payer: Self-pay | Admitting: Family Medicine

## 2019-06-02 NOTE — Telephone Encounter (Signed)
Requested medication (s) are due for refill today:yes  Requested medication (s) are on the active medication list: yes  Last refill:  11/29/2018  Future visit scheduled: yes  Notes to clinic:  refill cannot be delegated    Requested Prescriptions  Pending Prescriptions Disp Refills   diazepam (VALIUM) 10 MG tablet [Pharmacy Med Name: DIAZEPAM 10 MG TAB] 120 tablet     Sig: TAKE 1 TABLET BY MOUTH EVERY 6 HOURS AS NEEDED FOR ANXIETY      Not Delegated - Psychiatry:  Anxiolytics/Hypnotics Failed - 06/02/2019  9:24 AM      Failed - This refill cannot be delegated      Failed - Urine Drug Screen completed in last 360 days.      Failed - Valid encounter within last 6 months    Recent Outpatient Visits           11 months ago Annual physical exam   [facility] Birdie Sons, MD   2 years ago Elevated blood pressure reading   [facility] Birdie Sons, MD   2 years ago Essential hypertension   [facility] Birdie Sons, MD   2 years ago Cough   [facility] Birdie Sons, MD   2 years ago Cough   [facility] Birdie Sons, MD       Future Appointments             In 1 month [facility], [facility]

## 2019-06-07 DIAGNOSIS — M47816 Spondylosis without myelopathy or radiculopathy, lumbar region: Secondary | ICD-10-CM | POA: Diagnosis not present

## 2019-06-24 ENCOUNTER — Other Ambulatory Visit: Payer: Self-pay | Admitting: Family Medicine

## 2019-06-27 MED ORDER — OXYCODONE HCL 15 MG PO TABS: 15.0000 mg | ORAL_TABLET | ORAL | 0 refills | Status: DC | PRN

## 2019-07-04 NOTE — Progress Notes (Signed)
Subjective:   Courtney Ross is a 64 y.o. female who presents for Medicare Annual (Subsequent) preventive examination.    This visit is being conducted through telemedicine due to the COVID-19 pandemic. This patient has given me verbal consent via doximity to conduct this visit, patient states they are participating from their home address. Some vital signs may be absent or patient reported.    Patient identification: identified by name, DOB, and current address  Review of Systems:  N/A  Cardiac Risk Factors include: dyslipidemia     Objective:     Vitals: There were no vitals taken for this visit.  There is no height or weight on file to calculate BMI. Unable to obtain vitals due to visit being conducted via telephonically.   Advanced Directives 07/05/2019 04/05/2019 02/17/2019 01/27/2019 09/01/2018 08/30/2018 08/24/2018  Does Patient Have a Medical Advance Directive? Yes Yes Yes Yes Yes Yes Yes  Type of Paramedic of [place];Living will [place];Living will [place];Living will - [place];Living will Living will;Healthcare Power of [place];Living will  Does patient want to make changes to medical advance directive? - - No - Patient declined - - No - Patient declined -  Copy of [place] in Chart? Yes - validated most recent copy scanned in chart (See row information) No - copy requested Yes - validated most recent copy scanned in chart (See row information) - No - copy requested No - copy requested No - copy requested  Would patient like information on creating a medical advance directive? - - - - - - -    Tobacco Social History   Tobacco Use  Smoking Status Never Smoker  Smokeless Tobacco Never Used     Counseling given: Not Answered   Clinical Intake:  Pre-visit preparation completed: Yes  Pain : 0-10 Pain Score: 5  Pain Type: Chronic  pain(Due to scolios.) Pain Location: Back Pain Orientation: Lower Pain Descriptors / Indicators: Aching Pain Frequency: Intermittent     Nutritional Risks: None Diabetes: No  How often do you need to have someone help you when you read instructions, pamphlets, or other written materials from your doctor or pharmacy?: 1 - Never  Interpreter Needed?: No  Information entered by :: [facility], LPN  Past Medical History:  Diagnosis Date  . Anxiety   . Back pain   . Colon polyp   . Constipation   . Degenerative disc disease, lumbar   . Depression   . Family history of adverse reaction to anesthesia    daughter nausea  . Fatty liver   . GERD (gastroesophageal reflux disease)   . H/O spinal fusion   . Headache    migraines  . Headache, migraine 05/08/2015  . High cholesterol   . Hypertension   . Lower back pain   . Polycystic ovarian disease   . PONV (postoperative nausea and vomiting)    due to surgery from the 1990's  . Restless leg syndrome   . Spasm 01/29/2009   Past Surgical History:  Procedure Laterality Date  . [place]  . APPENDECTOMY  1988  . ARTHRODESIS METATARSALPHALANGEAL JOINT (MTPJ) Left 12/14/2014   Procedure: ARTHRODESIS METATARSALPHALANGEAL JOINT (MTPJ) 1ST;  Surgeon: Albertine Patricia, DPM;  Location: [place];  Service: Podiatry;  Laterality: Left;  LMA  . back fusion  2004   lumbar region. had broken facet joint.   Marland Kitchen  BACK SURGERY  [phone]   x3  . BLADDER SUSPENSION  2006  . BREAST BIOPSY Left 1990's   core Sankar  . BREAST SURGERY  2005   lumpectomy  . CARPAL TUNNEL RELEASE Bilateral 02/26/07,05/21/07  . CHOLECYSTECTOMY N/A 05/03/2018   Procedure: LAPAROSCOPIC CHOLECYSTECTOMY WITH INTRAOPERATIVE CHOLANGIOGRAM;  Surgeon: Robert Bellow, MD;  Location: [facility];  Service: General;  Laterality: N/A;  . COLONOSCOPY    . COLPORRHAPHY    . DILATION AND CURETTAGE OF UTERUS    . [place]    . FOOT SURGERY Left 2017   fusion on big toe, hammer toe, bunions  . HAMMER TOE SURGERY Left 03/22/2015   Procedure: HAMMER TOE CORRECTION L 2ND AND 3RD ;  Surgeon: Albertine Patricia, DPM;  Location: [place];  Service: Podiatry;  Laterality: Left;  WITH LOCAL  . HERNIA REPAIR Right 1988   inguinal  . LAMINECTOMY  1993, 1994  . NECK SURGERY  2006   fusion  . RECTOCELE REPAIR N/A 08/25/2016   Procedure: POSTERIOR REPAIR (RECTOCELE);  Surgeon: Brayton Mars, MD;  Location: [facility];  Service: Gynecology;  Laterality: N/A;  . SPINAL CORD STIMULATOR BATTERY EXCHANGE Right 04/08/2019   Procedure: SPINAL CORD STIMULATOR BATTERY EXCHANGE;  Surgeon: Clydell Hakim, MD;  Location: [place];  Service: Neurosurgery;  Laterality: Right;  SPINAL CORD STIMULATOR BATTERY EXCHANGE  . SPINAL CORD STIMULATOR IMPLANT  2014   stimulator in the ileo inguinal nerve due to scar tissue in abdomen  . TONSILLECTOMY  1985  . VENTRAL HERNIA REPAIR N/A 09/01/2018   6.4 cm Ventra lite mesh. HERNIA REPAIR VENTRAL ADULT;  Surgeon: Robert Bellow, MD;  Location: [facility];  Service: General;  Laterality: N/A;   Family History  Problem Relation Age of Onset  . Diabetes Father   . Hypertension Father   . CAD Father   . Diabetes Sister        non-Insulin dependent diabetes mellitus  . Heart disease Brother   . Cancer Daughter        cancer of the cervix at age 73-18  . Heart disease Brother   . Breast cancer Paternal Aunt   . Colon cancer Paternal Uncle   . Ovarian cancer Neg Hx    Social History   Socioeconomic History  . Marital status: Legally Separated    Spouse name: Not on file  . Number of children: 4  . Years of education: Not on file  . Highest education level: 9th grade  Occupational History  . 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 Smoking status: Never Smoker  . Smokeless tobacco: Never Used  Substance and Sexual Activity  . Alcohol use: No     Alcohol/week: 0.0 standard drinks  . Drug use: No  . Sexual activity: Yes    Birth control/protection: Surgical  Other Topics Concern  . Not on file  Social History Narrative   Pt is currently receiving assistance from [employer] and gets food stamps.    Social Determinants of Health   Financial Resource Strain: Low Risk   . Difficulty of Paying Living Expenses: Not hard at all  Food Insecurity: No Food Insecurity  . Worried About Charity fundraiser in the Last Year: Never true  . Ran Out of Food in the Last Year: Never true  Transportation Needs: No Transportation Needs  . Lack of Transportation (Medical): No  . Lack of Transportation (Non-Medical):  No  Physical Activity: Inactive  . Days of Exercise per Week: 0 days  . Minutes of Exercise per Session: 0 min  Stress: No Stress Concern Present  . Feeling of Stress : Not at all  Social Connections: Somewhat Isolated  . Frequency of Communication with Friends and Family: More than three times a week  . Frequency of Social Gatherings with Friends and Family: More than three times a week  . Attends Religious Services: More than 4 times per year  . Active Member of Clubs or Organizations: No  . Attends Archivist Meetings: Never  . Marital Status: Separated    Outpatient Encounter Medications as of 07/05/2019  Medication Sig  . AMITIZA 24 MCG capsule TAKE 1 CAPSULE BY MOUTH TWICE DAILY WITHA MEAL (Patient taking differently: Take 24 mcg by mouth daily with breakfast. )  . aspirin EC 81 MG tablet Take 81 mg by mouth at bedtime.  Marland Kitchen buPROPion (WELLBUTRIN SR) 150 MG 12 hr tablet Take 1 tablet (150 mg total) by mouth 3 (three) times daily.  . Cholecalciferol (VITAMIN D3) 5000 UNITS CAPS Take 5,000 Units by mouth daily.   . Coenzyme Q10 200 MG capsule Take 200 mg by mouth daily.  . cyanocobalamin (,VITAMIN B-12,) 1000 MCG/ML injection Inject 1,000 mcg into the muscle every 30 (thirty) days.  . cyclobenzaprine (FLEXERIL) 10 MG tablet  Take 1 tablet (10 mg total) by mouth 3 (three) times daily as needed for muscle spasms.  . diazepam (VALIUM) 10 MG tablet TAKE 1 TABLET BY MOUTH EVERY 6 HOURS AS NEEDED FOR ANXIETY  . diclofenac Sodium (VOLTAREN) 1 % GEL SMARTSIG:1 Gram(s) Topical 4 Times Daily PRN  . estradiol (ESTRACE) 1 MG tablet Take 1 tablet (1 mg total) by mouth daily.  . fluticasone (FLONASE) 50 MCG/ACT nasal spray Place 1 spray into both nostrils daily as needed for allergies or rhinitis.  . hydroquinone 4 % cream Apply 1 application topically daily.   Marland Kitchen LINZESS 145 MCG CAPS capsule TAKE 1 CAPSULE EVERY DAY  . meloxicam (MOBIC) 15 MG tablet Take 15 mg by mouth at bedtime.   . Multiple Vitamin (MULTIVITAMIN WITH MINERALS) TABS tablet Take 1 tablet by mouth daily.  . mupirocin cream (BACTROBAN) 2 % Apply to affected aread twice daily as needed.  Marland Kitchen omeprazole (PRILOSEC) 40 MG capsule Take 1 capsule (40 mg total) by mouth daily. (Patient taking differently: Take 40 mg by mouth daily as needed (heartburn). )  . oxyCODONE (ROXICODONE) 15 MG immediate release tablet Take 1-2 tablets (15-30 mg total) by mouth every 4 (four) hours as needed for pain.  . polyethylene glycol (MIRALAX / GLYCOLAX) packet Take 17 g by mouth 2 (two) times daily as needed for mild constipation (dissolve in an 8 oz glass of water).  . pregabalin (LYRICA) 75 MG capsule TAKE 1 CAPSULE BY MOUTH 3 TIMES DAILY (Patient taking differently: Take 75 mg by mouth 3 (three) times daily. )  . RESTASIS 0.05 % ophthalmic emulsion Place 1 drop into both eyes 2 (two) times daily.   Marland Kitchen rOPINIRole (REQUIP) 2 MG tablet Take 1 tablet (2 mg total) by mouth at bedtime.  . rosuvastatin (CRESTOR) 10 MG tablet Take 1 tablet (10 mg total) by mouth daily.  . Turmeric 500 MG CAPS Take 500 mg by mouth daily.   Marland Kitchen MYRBETRIQ 25 MG TB24 tablet TAKE ONE TABLET BY MOUTH EVERY DAY (Patient not taking: Reported on 02/17/2019)  . phentermine (ADIPEX-P) 37.5 MG tablet Take 1 tablet (37.5  mg  total) by mouth daily before breakfast. (Patient not taking: Reported on 07/05/2019)   No facility-administered encounter medications on file as of 07/05/2019.    Activities of Daily Living In your present state of health, do you have any difficulty performing the following activities: 07/05/2019 04/05/2019  Hearing? N N  Vision? N N  Difficulty concentrating or making decisions? N N  Walking or climbing stairs? N Y  Dressing or bathing? N N  Doing errands, shopping? N N  Preparing Food and eating ? N -  Using the Toilet? N -  In the past six months, have you accidently leaked urine? N -  Do you have problems with loss of bowel control? N -  Managing your Medications? N -  Managing your Finances? N -  Housekeeping or managing your Housekeeping? N -  Some recent data might be hidden    Patient Care Team: Birdie Sons, MD as PCP - General (Family Medicine) Leandrew Koyanagi, MD as Referring Physician (Ophthalmology) Arnell Asal, MD as Referring Physician (Neurosurgery) Bary Castilla, Forest Gleason, MD (General Surgery) Clydell Hakim, MD as Consulting Physician (Anesthesiology) Minda Ditto, MD as Referring Physician (Anesthesiology)    Assessment:   This is a routine wellness examination for Courtney Ross.  Exercise Activities and Dietary recommendations Current Exercise Habits: The patient does not participate in regular exercise at present, Exercise limited by: orthopedic condition(s)  Goals    . DIET - REDUCE SUGAR INTAKE     Recommend to cut back on desserts and sugar is diet.     . Exercise 3x per week (30 min per time)     Recommend to start exercising 3 times a week for at least 30 minutes. Pt to start this in 2019.    . Increase water intake     Starting 05/30/16, I will continue drinking 5 glasses of water a day.        Fall Risk: Fall Risk  07/05/2019 01/27/2019 09/16/2018 07/07/2018 06/30/2018  Falls in the past year? 0 0 0 0 0  Number falls in past  yr: 0 - 0 0 0  Injury with Fall? 0 - - 0 0  Follow up - - Falls evaluation completed Falls evaluation completed -    FALL RISK PREVENTION PERTAINING TO THE HOME:  Any stairs in or around the home? Yes  If so, are there any without handrails? No   Home free of loose throw rugs in walkways, pet beds, electrical cords, etc? Yes  Adequate lighting in your home to reduce risk of falls? Yes   ASSISTIVE DEVICES UTILIZED TO PREVENT FALLS:  Life alert? No  Use of a cane, walker or w/c? No  Grab bars in the bathroom? No  Shower chair or bench in shower? No  Elevated toilet seat or a handicapped toilet? Yes    TIMED UP AND GO:  Was the test performed? No .    Depression Screen PHQ 2/9 Scores 02/17/2019 01/27/2019 07/07/2018 06/30/2018  PHQ - 2 Score 0 0 0 0  PHQ- 9 Score - - 4 2     Cognitive Function: Declined today.      6CIT Screen 06/30/2018 05/30/2016  What Year? 0 points 0 points  What month? 0 points 0 points  What time? 0 points 0 points  Count back from 20 0 points 0 points  Months in reverse 0 points 0 points  Repeat phrase 0 points 4 points  Total Score 0 4    Immunization History  Administered Date(s) Administered  . Influenza Inj Mdck Quad Pf 04/10/2018  . Influenza Split 07/20/2007  . Influenza,inj,Quad PF,6+ Mos 04/05/2019  . Influenza-Unspecified 04/23/2013, 03/20/2017, 04/10/2018  . Pneumococcal Conjugate-13 05/11/2014  . Tdap 09/21/2015, 09/03/2016  . Zoster 01/13/2011    Qualifies for Shingles Vaccine? Yes  Zostavax completed 01/13/11. Due for Shingrix. Pt has been advised to call insurance company to determine out of pocket expense. Advised may also receive vaccine at local pharmacy or Health Dept. Verbalized acceptance and understanding.  Tdap: Up to date  Flu Vaccine: Up to date   Screening Tests Health Maintenance  Topic Date Due  . COLONOSCOPY  01/12/2018  . PAP SMEAR-Modifier  04/11/2019  . MAMMOGRAM  06/22/2020  . DEXA SCAN  01/03/2024  .  TETANUS/TDAP  09/04/2026  . INFLUENZA VACCINE  Completed  . Hepatitis C Screening  Completed  . HIV Screening  Completed    Cancer Screenings:  Colorectal Screening: Completed 01/12/13. Repeat every 5 years. Pt plans to schedule apt this year.   Mammogram: Completed 06/22/18. Repeat every 1-2 years as advised.   Bone Density: Completed 01/03/19. Results reflect NORMAL. Repeat every 5 years.  Lung Cancer Screening: (Low Dose CT Chest recommended if Age 68-80 years, 30 pack-year currently smoking OR have quit w/in 15years.) does not qualify.   Additional Screening:  Hepatitis C Screening: Up to date  Vision Screening: Recommended annual ophthalmology exams for early detection of glaucoma and other disorders of the eye.  Dental Screening: Recommended annual dental exams for proper oral hygiene  Community Resource Referral:  CRR required this visit?  No       Plan:  I have personally reviewed and addressed the Medicare Annual Wellness questionnaire and have noted the following in the patient's chart:  A. Medical and social history B. Use of alcohol, tobacco or illicit drugs  C. Current medications and supplements D. Functional ability and status E.  Nutritional status F.  Physical activity G. Advance directives H. List of other physicians I.  Hospitalizations, surgeries, and ER visits in previous 12 months J.  [place] such as hearing and vision if needed, cognitive and depression L. Referrals and appointments   In addition, I have reviewed and discussed with patient certain preventive protocols, quality metrics, and best practice recommendations. A written personalized care plan for preventive services as well as general preventive health recommendations were provided to patient. Nurse Health Advisor  Signed,    Zed Wanninger [place], [place]  075-GRM Nurse Health Advisor   Nurse Notes: Pt plans to call her GYN and schedule her pap smear this year. Pt has  already been in touch with GI and plans to schedule her colonoscopy this spring.

## 2019-07-05 ENCOUNTER — Ambulatory Visit (INDEPENDENT_AMBULATORY_CARE_PROVIDER_SITE_OTHER): Payer: Medicare Other

## 2019-07-05 ENCOUNTER — Encounter: Payer: Self-pay | Admitting: Family Medicine

## 2019-07-05 ENCOUNTER — Other Ambulatory Visit: Payer: Self-pay

## 2019-07-05 DIAGNOSIS — M549 Dorsalgia, unspecified: Secondary | ICD-10-CM | POA: Diagnosis not present

## 2019-07-05 DIAGNOSIS — Z0000 Encounter for general adult medical examination without abnormal findings: Secondary | ICD-10-CM | POA: Diagnosis not present

## 2019-07-05 DIAGNOSIS — Z981 Arthrodesis status: Secondary | ICD-10-CM | POA: Diagnosis not present

## 2019-07-05 DIAGNOSIS — G8929 Other chronic pain: Secondary | ICD-10-CM | POA: Diagnosis not present

## 2019-07-05 DIAGNOSIS — M47814 Spondylosis without myelopathy or radiculopathy, thoracic region: Secondary | ICD-10-CM | POA: Diagnosis not present

## 2019-07-05 DIAGNOSIS — Z8739 Personal history of other diseases of the musculoskeletal system and connective tissue: Secondary | ICD-10-CM | POA: Diagnosis not present

## 2019-07-05 DIAGNOSIS — M542 Cervicalgia: Secondary | ICD-10-CM | POA: Diagnosis not present

## 2019-07-05 DIAGNOSIS — M419 Scoliosis, unspecified: Secondary | ICD-10-CM | POA: Diagnosis not present

## 2019-07-05 NOTE — Patient Instructions (Signed)
[name] , Thank you for taking time to come for your Medicare Wellness Visit. I appreciate your ongoing commitment to your health goals. Please review the following plan we discussed and let me know if I can assist you in the future.   Screening recommendations/referrals: Colonoscopy: Currently due. Pt to schedule this year. Mammogram: Up to date, due 05/2020 Bone Density: Up to date, previous DEXA was normal. Due 12/2023. Recommended yearly ophthalmology/optometry visit for glaucoma screening and checkup Recommended yearly dental visit for hygiene and checkup  Vaccinations: Influenza vaccine: Up to date Tdap vaccine: Up to date, due 08/2026 Shingles vaccine: Pt declines today.     Advanced directives: Currently on file.   Conditions/risks identified: Continue to increase water intake to 6-8 8 oz glasses a day.   Next appointment: 07/09/20 @ 11:00 AM for an AWV. Declined scheduling an follow up with PCP at this time.   Preventive Care 40-64 Years, Female Preventive care refers to lifestyle choices and visits with your health care provider that can promote health and wellness. What does preventive care include?  A yearly physical exam. This is also called an annual well check.  Dental exams once or twice a year.  Routine eye exams. Ask your health care provider how often you should have your eyes checked.  Personal lifestyle choices, including:  Daily care of your teeth and gums.  Regular physical activity.  Eating a healthy diet.  Avoiding tobacco and drug use.  Limiting alcohol use.  Practicing safe sex.  Taking low-dose aspirin daily starting at age 27.  Taking vitamin and mineral supplements as recommended by your health care provider. What happens during an annual well check? The services and screenings done by your health care provider during your annual well check will depend on your age, overall health, lifestyle risk factors, and family history of  disease. Counseling  Your health care provider may ask you questions about your:  Alcohol use.  Tobacco use.  Drug use.  Emotional well-being.  Home and relationship well-being.  Sexual activity.  Eating habits.  Work and work Statistician.  Method of birth control.  Menstrual cycle.  Pregnancy history. Screening  You may have the following tests or measurements:  Height, weight, and BMI.  Blood pressure.  Lipid and cholesterol levels. These may be checked every 5 years, or more frequently if you are over 23 years old.  Skin check.  Lung cancer screening. You may have this screening every year starting at age 57 if you have a 30-pack-year history of smoking and currently smoke or have quit within the past 15 years.  Fecal occult blood test (FOBT) of the stool. You may have this test every year starting at age 60.  Flexible sigmoidoscopy or colonoscopy. You may have a sigmoidoscopy every 5 years or a colonoscopy every 10 years starting at age 65.  Hepatitis C blood test.  Hepatitis B blood test.  Sexually transmitted disease (STD) testing.  Diabetes screening. This is done by checking your blood sugar (glucose) after you have not eaten for a while (fasting). You may have this done every 1-3 years.  Mammogram. This may be done every 1-2 years. Talk to your health care provider about when you should start having regular mammograms. This may depend on whether you have a family history of breast cancer.  BRCA-related cancer screening. This may be done if you have a family history of breast, ovarian, tubal, or peritoneal cancers.  Pelvic exam and Pap test. This may  be done every 3 years starting at age 78. Starting at age 84, this may be done every 5 years if you have a Pap test in combination with an HPV test.  Bone density scan. This is done to screen for osteoporosis. You may have this scan if you are at high risk for osteoporosis. Discuss your test results,  treatment options, and if necessary, the need for more tests with your health care provider. Vaccines  Your health care provider may recommend certain vaccines, such as:  Influenza vaccine. This is recommended every year.  Tetanus, diphtheria, and acellular pertussis (Tdap, Td) vaccine. You may need a Td booster every 10 years.  Zoster vaccine. You may need this after age 66.  Pneumococcal 13-valent conjugate (PCV13) vaccine. You may need this if you have certain conditions and were not previously vaccinated.  Pneumococcal polysaccharide (PPSV23) vaccine. You may need one or two doses if you smoke cigarettes or if you have certain conditions. Talk to your health care provider about which screenings and vaccines you need and how often you need them. This information is not intended to replace advice given to you by your health care provider. Make sure you discuss any questions you have with your health care provider. Document Released: 07/06/2015 Document Revised: 02/27/2016 Document Reviewed: 04/10/2015 Elsevier Interactive Patient Education  2017 [employer] Prevention in the Home Falls can cause injuries. They can happen to people of all ages. There are many things you can do to make your home safe and to help prevent falls. What can I do on the outside of my home?  Regularly fix the edges of walkways and driveways and fix any cracks.  Remove anything that might make you trip as you walk through a door, such as a raised step or threshold.  Trim any bushes or trees on the path to your home.  Use bright outdoor lighting.  Clear any walking paths of anything that might make someone trip, such as rocks or tools.  Regularly check to see if handrails are loose or broken. Make sure that both sides of any steps have handrails.  Any raised decks and porches should have guardrails on the edges.  Have any leaves, snow, or ice cleared regularly.  Use sand or salt on walking  paths during winter.  Clean up any spills in your garage right away. This includes oil or grease spills. What can I do in the bathroom?  Use night lights.  Install grab bars by the toilet and in the tub and shower. Do not use towel bars as grab bars.  Use non-skid mats or decals in the tub or shower.  If you need to sit down in the shower, use a plastic, non-slip stool.  Keep the floor dry. Clean up any water that spills on the floor as soon as it happens.  Remove soap buildup in the tub or shower regularly.  Attach bath mats securely with double-sided non-slip rug tape.  Do not have throw rugs and other things on the floor that can make you trip. What can I do in the bedroom?  Use night lights.  Make sure that you have a light by your bed that is easy to reach.  Do not use any sheets or blankets that are too big for your bed. They should not hang down onto the floor.  Have a firm chair that has side arms. You can use this for support while you get dressed.  Do  not have throw rugs and other things on the floor that can make you trip. What can I do in the kitchen?  Clean up any spills right away.  Avoid walking on wet floors.  Keep items that you use a lot in easy-to-reach places.  If you need to reach something above you, use a strong step stool that has a grab bar.  Keep electrical cords out of the way.  Do not use floor polish or wax that makes floors slippery. If you must use wax, use non-skid floor wax.  Do not have throw rugs and other things on the floor that can make you trip. What can I do with my stairs?  Do not leave any items on the stairs.  Make sure that there are handrails on both sides of the stairs and use them. Fix handrails that are broken or loose. Make sure that handrails are as long as the stairways.  Check any carpeting to make sure that it is firmly attached to the stairs. Fix any carpet that is loose or worn.  Avoid having throw rugs at  the top or bottom of the stairs. If you do have throw rugs, attach them to the floor with carpet tape.  Make sure that you have a light switch at the top of the stairs and the bottom of the stairs. If you do not have them, ask someone to add them for you. What else can I do to help prevent falls?  Wear shoes that:  Do not have high heels.  Have rubber bottoms.  Are comfortable and fit you well.  Are closed at the toe. Do not wear sandals.  If you use a stepladder:  Make sure that it is fully opened. Do not climb a closed stepladder.  Make sure that both sides of the stepladder are locked into place.  Ask someone to hold it for you, if possible.  Clearly mark and make sure that you can see:  Any grab bars or handrails.  First and last steps.  Where the edge of each step is.  Use tools that help you move around (mobility aids) if they are needed. These include:  Canes.  Walkers.  Scooters.  Crutches.  Turn on the lights when you go into a dark area. Replace any light bulbs as soon as they burn out.  Set up your furniture so you have a clear path. Avoid moving your furniture around.  If any of your floors are uneven, fix them.  If there are any pets around you, be aware of where they are.  Review your medicines with your doctor. Some medicines can make you feel dizzy. This can increase your chance of falling. Ask your doctor what other things that you can do to help prevent falls. This information is not intended to replace advice given to you by your health care provider. Make sure you discuss any questions you have with your health care provider. Document Released: 04/05/2009 Document Revised: 11/15/2015 Document Reviewed: 07/14/2014 Elsevier Interactive Patient Education  2017 [employer].

## 2019-07-07 ENCOUNTER — Other Ambulatory Visit: Payer: Self-pay | Admitting: Family Medicine

## 2019-07-10 ENCOUNTER — Other Ambulatory Visit: Payer: Self-pay | Admitting: Family Medicine

## 2019-07-10 ENCOUNTER — Other Ambulatory Visit: Payer: Self-pay | Admitting: Obstetrics and Gynecology

## 2019-07-10 DIAGNOSIS — F324 Major depressive disorder, single episode, in partial remission: Secondary | ICD-10-CM

## 2019-07-11 ENCOUNTER — Other Ambulatory Visit: Payer: Self-pay

## 2019-07-11 ENCOUNTER — Telehealth: Payer: Self-pay | Admitting: Obstetrics and Gynecology

## 2019-07-11 NOTE — Telephone Encounter (Signed)
pt called in and stated that she is wanting to be taking off this Med.  MYRBETRIQ  Pt has apt to see cherry in April.

## 2019-07-11 NOTE — Telephone Encounter (Signed)
LM for patient to call the office to schedule appointment.

## 2019-07-12 NOTE — Telephone Encounter (Signed)
Called pt to inform pt that her request was done and they took her off the med.

## 2019-07-15 DIAGNOSIS — M47814 Spondylosis without myelopathy or radiculopathy, thoracic region: Secondary | ICD-10-CM | POA: Insufficient documentation

## 2019-07-26 ENCOUNTER — Other Ambulatory Visit: Payer: Self-pay | Admitting: Family Medicine

## 2019-07-26 NOTE — Telephone Encounter (Signed)
Copied from [place] [phone]. Topic: Quick Communication - Rx Refill/Question >> Jul 26, 2019  9:46 AM Leward Quan A wrote: Medication: oxyCODONE (ROXICODONE) 15 MG immediate release tablet  Has the patient contacted their pharmacy? Yes.   (Agent: If no, request that the patient contact the pharmacy for the refill.) (Agent: If yes, when and what did the pharmacy advise?)  Preferred Pharmacy (with phone number or street name): [place], [place] - [address]  Phone:  [phone] Fax:  [phone]     Agent: Please be advised that RX refills may take up to 3 business days. We ask that you follow-up with your pharmacy.

## 2019-07-27 MED ORDER — OXYCODONE HCL 15 MG PO TABS: 15.0000 mg | ORAL_TABLET | ORAL | 0 refills | Status: DC | PRN

## 2019-08-08 ENCOUNTER — Other Ambulatory Visit: Payer: Self-pay | Admitting: Obstetrics and Gynecology

## 2019-08-08 ENCOUNTER — Other Ambulatory Visit: Payer: Self-pay | Admitting: Family Medicine

## 2019-08-08 NOTE — Telephone Encounter (Signed)
Requested medication (s) are due for refill today: yes  Requested medication (s) are on the active medication list: yes  Last refill: 02/04/2019   #90  5 refills  Future visit scheduled   Notes to clinic: not delegated  Requested Prescriptions  Pending Prescriptions Disp Refills   pregabalin (LYRICA) 75 MG capsule [Pharmacy Med Name: PREGABALIN 75 MG CAP] 90 capsule     Sig: TAKE 1 CAPSULE BY MOUTH 3 TIMES DAILY      Not Delegated - Neurology:  Anticonvulsants - Controlled Failed - 08/08/2019 11:39 AM      Failed - This refill cannot be delegated      Passed - Valid encounter within last 12 months    Recent Outpatient Visits           1 year ago Annual physical exam   [facility] Birdie Sons, MD   2 years ago Elevated blood pressure reading   [facility] Birdie Sons, MD   2 years ago Essential hypertension   [facility] Birdie Sons, MD   2 years ago Cough   [facility] Birdie Sons, MD   2 years ago Cough   [facility] Birdie Sons, MD       Future Appointments             In 3 months Rubie Maid, MD [facility]

## 2019-08-08 NOTE — Telephone Encounter (Signed)
Requested medication (s) are due for refill today:   No  not on medication list  Requested medication (s) are on the active medication list:   No  Future visit scheduled:   No   Last ordered: 07/10/2019 no other information available.     This medication was refused earlier today by Dr. Nida Boatman office at [facility] "pt no longer under prescriber care.     I have  not been able to find who prescribed this.    It is not on her active med list.     Reason for returning.  Requested Prescriptions  Pending Prescriptions Disp Refills   MYRBETRIQ 25 MG TB24 tablet [Pharmacy Med Name: MYRBETRIQ 25 MG TAB] 90 tablet     Sig: TAKE 1 TABLET BY MOUTH DAILY      Urology: Bladder Agents - mirabegron Passed - 08/08/2019 12:20 PM      Passed - Last BP in normal range    BP Readings from Last 1 Encounters:  04/08/19 115/78          Passed - Valid encounter within last 12 months    Recent Outpatient Visits           1 year ago Annual physical exam   [facility] Birdie Sons, MD   2 years ago Elevated blood pressure reading   [facility] Birdie Sons, MD   2 years ago Essential hypertension   [facility] Birdie Sons, MD   2 years ago Cough   [facility] Birdie Sons, MD   2 years ago Cough   [facility] Birdie Sons, MD       Future Appointments             In 3 months Rubie Maid, MD [facility]

## 2019-08-24 ENCOUNTER — Other Ambulatory Visit: Payer: Self-pay | Admitting: Family Medicine

## 2019-08-24 NOTE — Telephone Encounter (Signed)
Requested medication (s) are due for refill today: yes  Requested medication (s) are on the active medication list: yes  Last refill:  07/26/19  Future visit scheduled: no  Notes to clinic: not delegated    Requested Prescriptions  Pending Prescriptions Disp Refills   oxyCODONE (ROXICODONE) 15 MG immediate release tablet 180 tablet 0    Sig: Take 1-2 tablets (15-30 mg total) by mouth every 4 (four) hours as needed for pain.      Not Delegated - Analgesics:  Opioid Agonists Failed - 08/24/2019 10:17 AM      Failed - This refill cannot be delegated      Failed - Urine Drug Screen completed in last 360 days.      Passed - Valid encounter within last 6 months    Recent Outpatient Visits           1 year ago Annual physical exam   [facility] Birdie Sons, MD   2 years ago Elevated blood pressure reading   [facility] Birdie Sons, MD   2 years ago Essential hypertension   [facility] Birdie Sons, MD   2 years ago Cough   [facility] Birdie Sons, MD   2 years ago Cough   [facility] Birdie Sons, MD       Future Appointments             In 2 months Rubie Maid, MD [facility]

## 2019-08-24 NOTE — Telephone Encounter (Signed)
Copied from [place] [phone]. Topic: Quick Communication - Rx Refill/Question >> Aug 24, 2019 10:12 AM Leward Quan A wrote: Medication: oxyCODONE (ROXICODONE) 15 MG immediate release tablet   Has the patient contacted their pharmacy? Yes.   (Agent: If no, request that the patient contact the pharmacy for the refill.) (Agent: If yes, when and what did the pharmacy advise?)  Preferred Pharmacy (with phone number or street name): [place], [place] - [place]  Phone:  [phone] Fax:  [phone]     Agent: Please be advised that RX refills may take up to 3 business days. We ask that you follow-up with your pharmacy.

## 2019-08-25 MED ORDER — OXYCODONE HCL 15 MG PO TABS: 15.0000 mg | ORAL_TABLET | ORAL | 0 refills | Status: DC | PRN

## 2019-09-19 DIAGNOSIS — M47814 Spondylosis without myelopathy or radiculopathy, thoracic region: Secondary | ICD-10-CM | POA: Diagnosis not present

## 2019-09-22 ENCOUNTER — Other Ambulatory Visit: Payer: Self-pay | Admitting: Family Medicine

## 2019-09-22 NOTE — Telephone Encounter (Signed)
Please advise, last time patient was seen in our office for visit was 03/07/2019. KW

## 2019-09-22 NOTE — Telephone Encounter (Signed)
Medication refill: oxyCODONE (ROXICODONE) 15 MG immediate release tablet [id number]    Pharmacy:  [place], [place] Phone:  [phone]  Fax:  [phone]         Pt aware of turn around time

## 2019-09-23 MED ORDER — OXYCODONE HCL 15 MG PO TABS: 15.0000 mg | ORAL_TABLET | ORAL | 0 refills | Status: DC | PRN

## 2019-10-06 ENCOUNTER — Other Ambulatory Visit: Payer: Self-pay | Admitting: Family Medicine

## 2019-10-06 ENCOUNTER — Encounter: Payer: Medicare Other | Admitting: Obstetrics and Gynecology

## 2019-10-06 DIAGNOSIS — F324 Major depressive disorder, single episode, in partial remission: Secondary | ICD-10-CM

## 2019-10-21 ENCOUNTER — Other Ambulatory Visit: Payer: Self-pay | Admitting: Family Medicine

## 2019-10-21 MED ORDER — OXYCODONE HCL 15 MG PO TABS: 15.0000 mg | ORAL_TABLET | ORAL | 0 refills | Status: DC | PRN

## 2019-10-21 NOTE — Telephone Encounter (Signed)
Copied from [place] [phone]. Topic: Quick Communication - Rx Refill/Question >> Oct 21, 2019 12:33 PM Mcneil, Ja-Kwan wrote: Medication: oxyCODONE (ROXICODONE) 15 MG immediate release tablet  Has the patient contacted their pharmacy? no  Preferred Pharmacy (with phone number or street name): [place], [place] - [place]  Phone: [phone]   Fax: [phone]  Agent: Please be advised that RX refills may take up to 3 business days. We ask that you follow-up with your pharmacy.

## 2019-10-24 ENCOUNTER — Other Ambulatory Visit: Payer: Self-pay | Admitting: Family Medicine

## 2019-10-24 NOTE — Telephone Encounter (Signed)
Requested medication (s) are due for refill today: Yes  Requested medication (s) are on the active medication list: Yes  Last refill:  06/03/19  Future visit scheduled: No  Notes to clinic:  See request.    Requested Prescriptions  Pending Prescriptions Disp Refills   diazepam (VALIUM) 10 MG tablet [Pharmacy Med Name: DIAZEPAM 10 MG TAB] 120 tablet     Sig: TAKE ONE TABLET EVERY 6 HOURS AS NEEDED FOR ANXIETY      Not Delegated - Psychiatry:  Anxiolytics/Hypnotics Failed - 10/24/2019  9:43 AM      Failed - This refill cannot be delegated      Failed - Urine Drug Screen completed in last 360 days.      Passed - Valid encounter within last 6 months    Recent Outpatient Visits           1 year ago Annual physical exam   [facility] Birdie Sons, MD   2 years ago Elevated blood pressure reading   [facility] Birdie Sons, MD   2 years ago Essential hypertension   [facility] Birdie Sons, MD   2 years ago Cough   [facility] Birdie Sons, MD   3 years ago Cough   [facility] Birdie Sons, MD       Future Appointments             In 2 weeks Rubie Maid, MD [facility]

## 2019-11-04 ENCOUNTER — Other Ambulatory Visit: Payer: Self-pay | Admitting: Family Medicine

## 2019-11-04 DIAGNOSIS — E7800 Pure hypercholesterolemia, unspecified: Secondary | ICD-10-CM

## 2019-11-04 NOTE — Telephone Encounter (Signed)
Requested Prescriptions  Pending Prescriptions Disp Refills  . estradiol (ESTRACE) 1 MG tablet [Pharmacy Med Name: ESTRADIOL 1 MG TAB] 90 tablet 0    Sig: TAKE ONE TABLET EVERY DAY     OB/GYN:  Estrogens Passed - 11/04/2019 12:33 PM      Passed - Mammogram is up-to-date per Health Maintenance      Passed - Last BP in normal range    BP Readings from Last 1 Encounters:  04/08/19 115/78         Passed - Valid encounter within last 12 months    Recent Outpatient Visits          1 year ago Annual physical exam   [facility] Birdie Sons, MD   2 years ago Elevated blood pressure reading   [facility] Birdie Sons, MD   2 years ago Essential hypertension   [facility] Birdie Sons, MD   3 years ago Cough   [facility] Birdie Sons, MD   3 years ago [place], Donald E, MD      Future Appointments            In 4 days Rubie Maid, MD [facility]

## 2019-11-08 ENCOUNTER — Other Ambulatory Visit: Payer: Self-pay | Admitting: Obstetrics and Gynecology

## 2019-11-08 ENCOUNTER — Encounter: Payer: Self-pay | Admitting: Obstetrics and Gynecology

## 2019-11-08 ENCOUNTER — Ambulatory Visit (INDEPENDENT_AMBULATORY_CARE_PROVIDER_SITE_OTHER): Payer: Medicare Other | Admitting: Obstetrics and Gynecology

## 2019-11-08 ENCOUNTER — Other Ambulatory Visit: Payer: Self-pay

## 2019-11-08 VITALS — BP 132/80 | HR 90 | Ht 68.0 in | Wt 183.9 lb

## 2019-11-08 DIAGNOSIS — I10 Essential (primary) hypertension: Secondary | ICD-10-CM

## 2019-11-08 DIAGNOSIS — Z7689 Persons encountering health services in other specified circumstances: Secondary | ICD-10-CM

## 2019-11-08 DIAGNOSIS — K644 Residual hemorrhoidal skin tags: Secondary | ICD-10-CM | POA: Diagnosis not present

## 2019-11-08 DIAGNOSIS — E663 Overweight: Secondary | ICD-10-CM | POA: Diagnosis not present

## 2019-11-08 DIAGNOSIS — Z1231 Encounter for screening mammogram for malignant neoplasm of breast: Secondary | ICD-10-CM | POA: Diagnosis not present

## 2019-11-08 DIAGNOSIS — Z01419 Encounter for gynecological examination (general) (routine) without abnormal findings: Secondary | ICD-10-CM

## 2019-11-08 MED ORDER — PHENTERMINE HCL 30 MG PO CAPS: 30.0000 mg | ORAL_CAPSULE | ORAL | 0 refills | Status: DC

## 2019-11-08 NOTE — Patient Instructions (Signed)
Mediterranean Diet A Mediterranean diet refers to food and lifestyle choices that are based on the traditions of countries located on the [employer]. This way of eating has been shown to help prevent certain conditions and improve outcomes for people who have chronic diseases, like kidney disease and heart disease. What are tips for following this plan? Lifestyle  Cook and eat meals together with your family, when possible.  Drink enough fluid to keep your urine clear or pale yellow.  Be physically active every day. This includes: ? Aerobic exercise like running or swimming. ? Leisure activities like gardening, walking, or housework.  Get 7-8 hours of sleep each night.  If recommended by your health care provider, drink red wine in moderation. This means 1 glass a day for nonpregnant women and 2 glasses a day for men. A glass of wine equals 5 oz (150 mL). Reading food labels   Check the serving size of packaged foods. For foods such as rice and pasta, the serving size refers to the amount of cooked product, not dry.  Check the total fat in packaged foods. Avoid foods that have saturated fat or trans fats.  Check the ingredients list for added sugars, such as corn syrup. Shopping  At the grocery store, buy most of your food from the areas near the walls of the store. This includes: ? Fresh fruits and vegetables (produce). ? Grains, beans, nuts, and seeds. Some of these may be available in unpackaged forms or large amounts (in bulk). ? Fresh seafood. ? Poultry and eggs. ? Low-fat dairy products.  Buy whole ingredients instead of prepackaged foods.  Buy fresh fruits and vegetables in-season from local farmers markets.  Buy frozen fruits and vegetables in resealable bags.  If you do not have access to quality fresh seafood, buy precooked frozen shrimp or canned fish, such as tuna, salmon, or sardines.  Buy small amounts of raw or cooked vegetables, salads, or olives from  the deli or salad bar at your store.  Stock your pantry so you always have certain foods on hand, such as olive oil, canned tuna, canned tomatoes, rice, pasta, and beans. Cooking  Cook foods with extra-virgin olive oil instead of using butter or other vegetable oils.  Have meat as a side dish, and have vegetables or grains as your main dish. This means having meat in small portions or adding small amounts of meat to foods like pasta or stew.  Use beans or vegetables instead of meat in common dishes like chili or lasagna.  Experiment with different cooking methods. Try roasting or broiling vegetables instead of steaming or sauteing them.  Add frozen vegetables to soups, stews, pasta, or rice.  Add nuts or seeds for added healthy fat at each meal. You can add these to yogurt, salads, or vegetable dishes.  Marinate fish or vegetables using olive oil, lemon juice, garlic, and fresh herbs. Meal planning   Plan to eat 1 vegetarian meal one day each week. Try to work up to 2 vegetarian meals, if possible.  Eat seafood 2 or more times a week.  Have healthy snacks readily available, such as: ? Vegetable sticks with hummus. ? [place] yogurt. ? Fruit and nut trail mix.  Eat balanced meals throughout the week. This includes: ? Fruit: 2-3 servings a day ? Vegetables: 4-5 servings a day ? Low-fat dairy: 2 servings a day ? Fish, poultry, or lean meat: 1 serving a day ? Beans and legumes: 2 or more servings a week ?  Nuts and seeds: 1-2 servings a day ? Whole grains: 6-8 servings a day ? Extra-virgin olive oil: 3-4 servings a day  Limit red meat and sweets to only a few servings a month What are my food choices?  Mediterranean diet ? Recommended  Grains: Whole-grain pasta. Brown rice. Bulgar wheat. Polenta. Couscous. Whole-wheat bread. [place].  Vegetables: Artichokes. Beets. Broccoli. Cabbage. Carrots. Eggplant. Green beans. Chard. Kale. Spinach. Onions. Leeks. Peas. Squash.  Tomatoes. Peppers. Radishes.  Fruits: Apples. Apricots. Avocado. Berries. Bananas. Cherries. Dates. Figs. Grapes. Lemons. Melon. Oranges. Peaches. Plums. Pomegranate.  Meats and other protein foods: Beans. Almonds. Sunflower seeds. Pine nuts. Peanuts. [place]. Salmon. Scallops. Shrimp. [place]. Tilapia. Clams. Oysters. Eggs.  Dairy: Low-fat milk. Cheese. Greek yogurt.  Beverages: Water. Red wine. Herbal tea.  Fats and oils: Extra virgin olive oil. Avocado oil. Grape seed oil.  Sweets and desserts: [place] yogurt with honey. Baked apples. Poached pears. Trail mix.  Seasoning and other foods: Basil. Cilantro. Coriander. Cumin. Mint. Parsley. Sage. Rosemary. Tarragon. Garlic. Oregano. Thyme. Pepper. Balsalmic vinegar. Tahini. Hummus. Tomato sauce. Olives. Mushrooms. ? Limit these  Grains: Prepackaged pasta or rice dishes. Prepackaged cereal with added sugar.  Vegetables: Deep fried potatoes (french fries).  Fruits: Fruit canned in syrup.  Meats and other protein foods: Beef. Pork. Lamb. Poultry with skin. Hot dogs. [place] Salines.  Dairy: Ice cream. Sour cream. Whole milk.  Beverages: Juice. Sugar-sweetened soft drinks. Beer. Liquor and spirits.  Fats and oils: Butter. Canola oil. Vegetable oil. Beef fat (tallow). Lard.  Sweets and desserts: Cookies. Cakes. Pies. Candy.  Seasoning and other foods: Mayonnaise. Premade sauces and marinades. The items listed may not be a complete list. Talk with your dietitian about what dietary choices are right for you. Summary  The Mediterranean diet includes both food and lifestyle choices.  Eat a variety of fresh fruits and vegetables, beans, nuts, seeds, and whole grains.  Limit the amount of red meat and sweets that you eat.  Talk with your health care provider about whether it is safe for you to drink red wine in moderation. This means 1 glass a day for nonpregnant women and 2 glasses a day for men. A glass of wine equals 5 oz (150 mL). This information  is not intended to replace advice given to you by your health care provider. Make sure you discuss any questions you have with your health care provider. Document Revised: 02/07/2016 Document Reviewed: 01/31/2016 Elsevier Patient Education  [place] Maintenance for Postmenopausal Women Menopause is a normal process in which your ability to get pregnant comes to an end. This process happens slowly over many months or years, usually between the ages of 41 and 36. Menopause is complete when you have missed your menstrual periods for 12 months. It is important to talk with your health care provider about some of the most common conditions that affect women after menopause (postmenopausal women). These include heart disease, cancer, and bone loss (osteoporosis). Adopting a healthy lifestyle and getting preventive care can help to promote your health and wellness. The actions you take can also lower your chances of developing some of these common conditions. What should I know about menopause? During menopause, you may get a number of symptoms, such as:  Hot flashes. These can be moderate or severe.  Night sweats.  Decrease in sex drive.  Mood swings.  Headaches.  Tiredness.  Irritability.  Memory problems.  Insomnia. Choosing to treat or not to treat these symptoms is  a decision that you make with your health care provider. Do I need hormone replacement therapy?  Hormone replacement therapy is effective in treating symptoms that are caused by menopause, such as hot flashes and night sweats.  Hormone replacement carries certain risks, especially as you become older. If you are thinking about using estrogen or estrogen with progestin, discuss the benefits and risks with your health care provider. What is my risk for heart disease and stroke? The risk of heart disease, heart attack, and stroke increases as you age. One of the causes may be a change in the body's hormones  during menopause. This can affect how your body uses dietary fats, triglycerides, and cholesterol. Heart attack and stroke are medical emergencies. There are many things that you can do to help prevent heart disease and stroke. Watch your blood pressure  High blood pressure causes heart disease and increases the risk of stroke. This is more likely to develop in people who have high blood pressure readings, are of African descent, or are overweight.  Have your blood pressure checked: ? Every 3-5 years if you are 58-52 years of age. ? Every year if you are 53 years old or older. Eat a healthy diet   Eat a diet that includes plenty of vegetables, fruits, low-fat dairy products, and lean protein.  Do not eat a lot of foods that are high in solid fats, added sugars, or sodium. Get regular exercise Get regular exercise. This is one of the most important things you can do for your health. Most adults should:  Try to exercise for at least 150 minutes each week. The exercise should increase your heart rate and make you sweat (moderate-intensity exercise).  Try to do strengthening exercises at least twice each week. Do these in addition to the moderate-intensity exercise.  Spend less time sitting. Even light physical activity can be beneficial. Other tips  Work with your health care provider to achieve or maintain a healthy weight.  Do not use any products that contain nicotine or tobacco, such as cigarettes, e-cigarettes, and chewing tobacco. If you need help quitting, ask your health care provider.  Know your numbers. Ask your health care provider to check your cholesterol and your blood sugar (glucose). Continue to have your blood tested as directed by your health care provider. Do I need screening for cancer? Depending on your health history and family history, you may need to have cancer screening at different stages of your life. This may include screening for:  Breast cancer.  Cervical  cancer.  Lung cancer.  Colorectal cancer. What is my risk for osteoporosis? After menopause, you may be at increased risk for osteoporosis. Osteoporosis is a condition in which bone destruction happens more quickly than new bone creation. To help prevent osteoporosis or the bone fractures that can happen because of osteoporosis, you may take the following actions:  If you are 53-13 years old, get at least 1,000 mg of calcium and at least 600 mg of vitamin D per day.  If you are older than age 38 but younger than age 39, get at least 1,200 mg of calcium and at least 600 mg of vitamin D per day.  If you are older than age 75, get at least 1,200 mg of calcium and at least 800 mg of vitamin D per day. Smoking and drinking excessive alcohol increase the risk of osteoporosis. Eat foods that are rich in calcium and vitamin D, and do weight-bearing exercises several times each  week as directed by your health care provider. How does menopause affect my mental health? Depression may occur at any age, but it is more common as you become older. Common symptoms of depression include:  Low or sad mood.  Changes in sleep patterns.  Changes in appetite or eating patterns.  Feeling an overall lack of motivation or enjoyment of activities that you previously enjoyed.  Frequent crying spells. Talk with your health care provider if you think that you are experiencing depression. General instructions See your health care provider for regular wellness exams and vaccines. This may include:  Scheduling regular health, dental, and eye exams.  Getting and maintaining your vaccines. These include: ? Influenza vaccine. Get this vaccine each year before the flu season begins. ? Pneumonia vaccine. ? Shingles vaccine. ? Tetanus, diphtheria, and pertussis (Tdap) booster vaccine. Your health care provider may also recommend other immunizations. Tell your health care provider if you have ever been abused or do  not feel safe at home. Summary  Menopause is a normal process in which your ability to get pregnant comes to an end.  This condition causes hot flashes, night sweats, decreased interest in sex, mood swings, headaches, or lack of sleep.  Treatment for this condition may include hormone replacement therapy.  Take actions to keep yourself healthy, including exercising regularly, eating a healthy diet, watching your weight, and checking your blood pressure and blood sugar levels.  Get screened for cancer and depression. Make sure that you are up to date with all your vaccines. This information is not intended to replace advice given to you by your health care provider. Make sure you discuss any questions you have with your health care provider. Document Revised: 06/02/2018 Document Reviewed: 06/02/2018 Elsevier Patient Education  2020 [employer].

## 2019-11-08 NOTE — Progress Notes (Signed)
ANNUAL PREVENTATIVE CARE GYNECOLOGY  ENCOUNTER NOTE  Subjective:       Courtney Ross is a 64 y.o. [phone] female here for a routine annual gynecologic exam. The patient is sexually active. The patient is taking hormone replacement therapy (has been on regimen for ~ 8-10 years. Patient denies post-menopausal vaginal bleeding. The patient wears seatbelts: yes. The patient participates in regular exercise: yes (walking 2-3 miles per day, climbs stairs). Has the patient ever been transfused or tattooed?: yes.   Current complaints: 1.  Has a history of painful hemorrhoids, desires to have a referral for possible banding.  2. Desires to discuss weight loss management.  She reports that she has used Phentermine in the past, and would like to resume.  Has gained lbs since her last visit in August 2020. She has a history of back issues (had back surgery last year), and has dyslipidemia. She is currently working on decreasing her carbohydrate intake (however is eating more fruit) and overall calories.  Currently walks 2-3 miles per day for activity. Drinks ~ 4-6 bottles of water a day.    Gynecologic History No LMP recorded. Patient has had a hysterectomy and is postmenopausal.  Contraception: status post hysterectomy. Last Pap: no longer needed, last performed in 2016. Results were: no prior history of abnormal pap smears.  Last mammogram: 06/22/2018. Results were: normal Last Colonoscopy: ~ 10 years ago.  Plans to have one done this summer.  Last Dexa Scan: 01/03/2019. Results were normal.  BMD -0.9.    Obstetric History OB History  Gravida Para Term Preterm AB Living  7 4 4   3 4   SAB TAB Ectopic Multiple Live Births  2   1   4     # Outcome Date GA Lbr Len/2nd Weight Sex Delivery Anes PTL Lv  7 Term 1984   6 lb 6.4 oz (2.903 kg) F Vag-Spont   LIV  6 Term 1980   8 lb 4.8 oz (3.765 kg) M Vag-Spont   LIV  5 Term 1977   7 lb (3.175 kg) M Vag-Spont   LIV  4 Term 1972   7 lb 9.6 oz (3.447 kg)  M Vag-Spont   LIV  3 Ectopic           2 SAB           1 SAB             Past Medical History:  Diagnosis Date  . Anxiety   . Back pain   . Colon polyp   . Constipation   . Degenerative disc disease, lumbar   . Depression   . Family history of adverse reaction to anesthesia    daughter nausea  . Fatty liver   . GERD (gastroesophageal reflux disease)   . H/O spinal fusion   . Headache    migraines  . Headache, migraine 05/08/2015  . High cholesterol   . Hypertension   . Lower back pain   . Polycystic ovarian disease   . PONV (postoperative nausea and vomiting)    due to surgery from the 1990's  . Restless leg syndrome   . Spasm 01/29/2009    Family History  Problem Relation Age of Onset  . Diabetes Father   . Hypertension Father   . CAD Father   . Diabetes Sister        non-Insulin dependent diabetes mellitus  . Heart disease Brother   . Cancer Daughter  cancer of the cervix at age 51-18  . Heart disease Brother   . Breast cancer Paternal Aunt   . Colon cancer Paternal Uncle   . Ovarian cancer Neg Hx     Past Surgical History:  Procedure Laterality Date  . [place]  . APPENDECTOMY  1988  . ARTHRODESIS METATARSALPHALANGEAL JOINT (MTPJ) Left 12/14/2014   Procedure: ARTHRODESIS METATARSALPHALANGEAL JOINT (MTPJ) 1ST;  Surgeon: Albertine Patricia, DPM;  Location: [facility];  Service: Podiatry;  Laterality: Left;  LMA  . back fusion  2004   lumbar region. had broken facet joint.   Marland Kitchen BACK SURGERY  [phone]   x3  . BLADDER SUSPENSION  2006  . BREAST BIOPSY Left 1990's   core Sankar  . BREAST SURGERY  2005   lumpectomy  . CARPAL TUNNEL RELEASE Bilateral 02/26/07,05/21/07  . CHOLECYSTECTOMY N/A 05/03/2018   Procedure: LAPAROSCOPIC CHOLECYSTECTOMY WITH INTRAOPERATIVE CHOLANGIOGRAM;  Surgeon: Robert Bellow, MD;  Location: [facility];  Service: General;  Laterality: N/A;  . COLONOSCOPY    . COLPORRHAPHY    . DILATION AND  CURETTAGE OF UTERUS    . [place]  . FOOT SURGERY Left 2017   fusion on big toe, hammer toe, bunions  . HAMMER TOE SURGERY Left 03/22/2015   Procedure: HAMMER TOE CORRECTION L 2ND AND 3RD ;  Surgeon: Albertine Patricia, DPM;  Location: [facility];  Service: Podiatry;  Laterality: Left;  WITH LOCAL  . HERNIA REPAIR Right 1988   inguinal  . LAMINECTOMY  1993, 1994  . NECK SURGERY  2006   fusion  . RECTOCELE REPAIR N/A 08/25/2016   Procedure: POSTERIOR REPAIR (RECTOCELE);  Surgeon: Brayton Mars, MD;  Location: [facility];  Service: Gynecology;  Laterality: N/A;  . SPINAL CORD STIMULATOR BATTERY EXCHANGE Right 04/08/2019   Procedure: SPINAL CORD STIMULATOR BATTERY EXCHANGE;  Surgeon: Clydell Hakim, MD;  Location: [facility];  Service: Neurosurgery;  Laterality: Right;  SPINAL CORD STIMULATOR BATTERY EXCHANGE  . SPINAL CORD STIMULATOR IMPLANT  2014   stimulator in the ileo inguinal nerve due to scar tissue in abdomen  . TONSILLECTOMY  1985  . VENTRAL HERNIA REPAIR N/A 09/01/2018   6.4 cm Ventra lite mesh. HERNIA REPAIR VENTRAL ADULT;  Surgeon: Robert Bellow, MD;  Location: [facility];  Service: General;  Laterality: N/A;    Social History   Socioeconomic History  . Marital status: Legally Separated    Spouse name: Not on file  . Number of children: 4  . Years of education: Not on file  . Highest education level: 9th grade  Occupational History  . 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 Smoking status: Never Smoker  . Smokeless tobacco: Never Used  Substance and Sexual Activity  . Alcohol use: No    Alcohol/week: 0.0 standard drinks  . Drug use: No  . Sexual activity: Yes    Birth control/protection: Surgical  Other Topics Concern  . Not on file  Social History Narrative   Pt is currently receiving assistance from [employer] and gets food stamps.    Social Determinants of Health   Financial Resource Strain: Low Risk     . Difficulty of Paying Living Expenses: Not hard at all  Food Insecurity: No Food Insecurity  . Worried About Charity fundraiser in the Last Year: Never true  . Ran Out of Food in the Last Year: Never  true  Transportation Needs: No Transportation Needs  . Lack of Transportation (Medical): No  . Lack of Transportation (Non-Medical): No  Physical Activity: Inactive  . Days of Exercise per Week: 0 days  . Minutes of Exercise per Session: 0 min  Stress: No Stress Concern Present  . Feeling of Stress : Not at all  Social Connections: Somewhat Isolated  . Frequency of Communication with Friends and Family: More than three times a week  . Frequency of Social Gatherings with Friends and Family: More than three times a week  . Attends Religious Services: More than 4 times per year  . Active Member of Clubs or Organizations: No  . Attends Archivist Meetings: Never  . Marital Status: Separated  Intimate Partner Violence: Not At Risk  . Fear of Current or Ex-Partner: No  . Emotionally Abused: No  . Physically Abused: No  . Sexually Abused: No    Current Outpatient Medications on File Prior to Visit  Medication Sig Dispense Refill  . aspirin EC 81 MG tablet Take 81 mg by mouth at bedtime.    Marland Kitchen buPROPion (WELLBUTRIN SR) 150 MG 12 hr tablet TAKE ONE TABLET 3 TIMES DAILY 90 tablet 3  . Cholecalciferol (VITAMIN D3) 5000 UNITS CAPS Take 5,000 Units by mouth daily.     . Coenzyme Q10 200 MG capsule Take 200 mg by mouth daily.    . cyanocobalamin (,VITAMIN B-12,) 1000 MCG/ML injection Inject 1,000 mcg into the muscle every 30 (thirty) days.    . cyclobenzaprine (FLEXERIL) 10 MG tablet Take 1 tablet (10 mg total) by mouth 3 (three) times daily as needed for muscle spasms. 90 tablet 5  . diazepam (VALIUM) 10 MG tablet TAKE ONE TABLET EVERY 6 HOURS AS NEEDED FOR ANXIETY 120 tablet 5  . diclofenac Sodium (VOLTAREN) 1 % GEL SMARTSIG:1 Gram(s) Topical 4 Times Daily PRN    . estradiol  (ESTRACE) 1 MG tablet TAKE ONE TABLET EVERY DAY 90 tablet 0  . fluticasone (FLONASE) 50 MCG/ACT nasal spray Place 1 spray into both nostrils daily as needed for allergies or rhinitis.    . hydroquinone 4 % cream Apply 1 application topically daily.     Marland Kitchen LINZESS 145 MCG CAPS capsule TAKE 1 CAPSULE EVERY DAY 30 capsule 12  . lubiprostone (AMITIZA) 24 MCG capsule TAKE 1 CAPSULE BY MOUTH TWICE DAILY WITHA MEAL 60 capsule 1  . meloxicam (MOBIC) 15 MG tablet Take 15 mg by mouth at bedtime.     . Multiple Vitamin (MULTIVITAMIN WITH MINERALS) TABS tablet Take 1 tablet by mouth daily.    . mupirocin cream (BACTROBAN) 2 % APPLY TO AFFECTED AREAS TOPICALLY TWICE DAILY 15 g 2  . omeprazole (PRILOSEC) 40 MG capsule Take 1 capsule (40 mg total) by mouth daily. (Patient taking differently: Take 40 mg by mouth daily as needed (heartburn). ) 30 capsule 1  . oxyCODONE (ROXICODONE) 15 MG immediate release tablet Take 1-2 tablets (15-30 mg total) by mouth every 4 (four) hours as needed for pain. 180 tablet 0  . polyethylene glycol (MIRALAX / GLYCOLAX) packet Take 17 g by mouth 2 (two) times daily as needed for mild constipation (dissolve in an 8 oz glass of water). 14 each 1  . pregabalin (LYRICA) 75 MG capsule Take 1 capsule (75 mg total) by mouth 3 (three) times daily. 90 capsule 5  . RESTASIS 0.05 % ophthalmic emulsion Place 1 drop into both eyes 2 (two) times daily.     Marland Kitchen rOPINIRole (  REQUIP) 2 MG tablet Take 1 tablet (2 mg total) by mouth at bedtime. 90 tablet 3  . rosuvastatin (CRESTOR) 10 MG tablet TAKE ONE TABLET EVERY DAY 90 tablet 4  . Turmeric 500 MG CAPS Take 500 mg by mouth daily.     . phentermine (ADIPEX-P) 37.5 MG tablet Take 1 tablet (37.5 mg total) by mouth daily before breakfast. (Patient not taking: Reported on 07/05/2019) 30 tablet 0   No current facility-administered medications on file prior to visit.    Allergies  Allergen Reactions  . Hydrocodone Itching    Even in cough syrup causes all  body itching  . Lisinopril Cough  . Mirtazapine     Weight gain  . Other Nausea And Vomiting    general anesthesia from 1990      Review of Systems ROS Review of Systems - General ROS: negative for - chills, fatigue, fever, hot flashes, night sweats, or weight loss.  Weight gain.  Psychological ROS: negative for - anxiety, decreased libido, depression, mood swings, physical abuse or sexual abuse Ophthalmic ROS: negative for - blurry vision, eye pain or loss of vision ENT ROS: negative for - headaches, hearing change, visual changes or vocal changes Allergy and Immunology ROS: negative for - hives, itchy/watery eyes or seasonal allergies Hematological and Lymphatic ROS: negative for - bleeding problems, bruising, swollen lymph nodes or weight loss Endocrine ROS: negative for - galactorrhea, hair pattern changes, hot flashes, malaise/lethargy, mood swings, palpitations, polydipsia/polyuria, skin changes, temperature intolerance or unexpected weight changes Breast ROS: negative for - new or changing breast lumps or nipple discharge Respiratory ROS: negative for - cough or shortness of breath Cardiovascular ROS: negative for - chest pain, irregular heartbeat, palpitations or shortness of breath Gastrointestinal ROS: no abdominal pain, change in bowel habits, or black or bloody stools. Positive for hemorrhoids.  Genito-Urinary ROS: no dysuria, trouble voiding, or hematuria Musculoskeletal ROS: negative for - joint pain or joint stiffness Neurological ROS: negative for - bowel and bladder control changes Dermatological ROS: negative for rash and skin lesion changes   Objective:   BP 132/80   Pulse 90   Ht 5\' 8"  (1.727 m)   Wt 183 lb 14.4 oz (83.4 kg)   BMI 27.96 kg/m  CONSTITUTIONAL: Well-developed, well-nourished female in no acute distress. Overweight PSYCHIATRIC: Normal mood and affect. Normal behavior. Normal judgment and thought content. [place]: Alert and oriented to person,  place, and time. Normal muscle tone coordination. No cranial nerve deficit noted. HENT:  Normocephalic, atraumatic, External right and left ear normal. Oropharynx is clear and moist EYES: Conjunctivae and EOM are normal. Pupils are equal, round, and reactive to light. No scleral icterus.  NECK: Normal range of motion, supple, no masses.  Normal thyroid.  SKIN: Skin is warm and dry. No rash noted. Not diaphoretic. No erythema. No pallor. CARDIOVASCULAR: Normal heart rate noted, regular rhythm, no murmur. RESPIRATORY: Clear to auscultation bilaterally. Effort and breath sounds normal, no problems with respiration noted. BREASTS: Symmetric in size. No masses, skin changes, nipple drainage, or lymphadenopathy. ABDOMEN: Soft, normal bowel sounds, no distention noted.  No tenderness, rebound or guarding.  BLADDER: Normal PELVIC:  Bladder no bladder distension noted  Urethra: normal appearing urethra with no masses, tenderness or lesions  Vulva: normal appearing vulva with no masses, tenderness or lesions  Vagina: normal appearing vagina with normal color and discharge, no lesions  Cervix: surgically absent  Uterus: surgically absent, vaginal cuff well healed  Adnexa: normal adnexa in size, nontender and  no masses  RV: Large external hemorrhoids present and Normal Sphincter tone  MUSCULOSKELETAL: Normal range of motion. No tenderness.  No cyanosis, clubbing, or edema.  2+ distal pulses. LYMPHATIC: No Axillary, Supraclavicular, or Inguinal Adenopathy.   Labs: Lab Results  Component Value Date   WBC 6.7 04/05/2019   HGB 13.5 04/05/2019   HCT 42.5 04/05/2019   MCV 82.2 04/05/2019   PLT 252 04/05/2019    Lab Results  Component Value Date   CREATININE 0.94 04/05/2019   BUN 18 04/05/2019   NA 141 04/05/2019   K 4.0 04/05/2019   CL 106 04/05/2019   CO2 26 04/05/2019    Lab Results  Component Value Date   ALT 19 04/05/2019   AST 17 04/05/2019   ALKPHOS 96 04/05/2019   BILITOT 0.5  04/05/2019    Lab Results  Component Value Date   CHOL 267 (H) 07/12/2018   HDL 71 07/12/2018   LDLCALC 172 (H) 07/12/2018   TRIG 118 07/12/2018   CHOLHDL 3.8 07/12/2018    Lab Results  Component Value Date   TSH 3.500 07/12/2018    No results found for: HGBA1C   Assessment:   Encounter for well woman exam with routine gynecological exam Breast cancer screening by mammogram  Encounter for weight management Overweight (BMI 25.0-29.9) External hemorrhoid Essential HTN  Plan:  - Pap: Not needed. Patient has had a hysterectomy.  - Mammogram: Ordered - Stool Guaiac Testing:  Not Ordered. Plans for colonoscopy this summer.  - Labs: B12, Lipid 1 and Hemoglobin A1C - Routine preventative health maintenance measures emphasized: Exercise/Diet/Weight control, Tobacco Warnings, Alcohol/Substance use risks and Stress Management - Referral to GI for consultation hemorrhoid banding.  - Essential HTN, well controlled.  - Discussion had on weight management. Currently with BMI 27. Patient has had a h/o use of Phentermine in the past for weight management.  Is decreasing fat in her diet, taking in more fruit. Cautioned on carb content of some fruits.  Is walking daily. Advised on increasing physical activity to include some weight bearing exercise.  Advised on increasing water intake.  Has a h/o dyslipidemia and being overweight, is a candidate for weight loss medication, especially in light of more limited physical activity due to spine issues.   Given FDA recommendations on use of medication for an average of 3 months, judicious and responsible use of medication as it is a controlled substance. Goal of weight loss is 5% at the end of 3 months. Discussed side effects of medications, and that if her BP becomes uncontrolled, she will need to discontinue medication prior to 3 months. Patient notes understanding.  - Follow up in 1 month for weight loss management. Return in 1 year for annual exam.    Rubie Maid, MD [facility]

## 2019-11-08 NOTE — Progress Notes (Signed)
Pt present for annual exam. Pt requesting a refill of phenetamine and hydroquinone. Pt denies any issues at this time.

## 2019-11-09 ENCOUNTER — Telehealth: Payer: Self-pay | Admitting: Obstetrics and Gynecology

## 2019-11-09 LAB — LIPID PANEL
Chol/HDL Ratio: 3.1 ratio (ref 0.0–4.4)
Cholesterol, Total: 251 mg/dL — ABNORMAL HIGH (ref 100–199)
HDL: 81 mg/dL (ref >39)
LDL Chol Calc (NIH): 139 mg/dL — ABNORMAL HIGH (ref 0–99)
Triglycerides: 176 mg/dL — ABNORMAL HIGH (ref 0–149)
VLDL Cholesterol Cal: 31 mg/dL (ref 5–40)

## 2019-11-09 LAB — HEMOGLOBIN A1C
Est. average glucose Bld gHb Est-mCnc: 111 mg/dL
Hgb A1c MFr Bld: 5.5 % (ref 4.8–5.6)

## 2019-11-09 LAB — VITAMIN B12: Vitamin B-12: 577 pg/mL (ref 232–1245)

## 2019-11-09 LAB — VITAMIN D 25 HYDROXY (VIT D DEFICIENCY, FRACTURES): Vit D, 25-Hydroxy: 27.1 ng/mL — ABNORMAL LOW (ref 30.0–100.0)

## 2019-11-09 NOTE — Telephone Encounter (Signed)
Patient called in saying her pharmacy never received the prescription for hydroquinone, and that she would like the phentermine prescription called in as a tablet rather than a capsule so that she can take half of the pill at a time. Could you please advise?

## 2019-11-11 ENCOUNTER — Other Ambulatory Visit: Payer: Self-pay

## 2019-11-11 MED ORDER — HYDROQUINONE 4 % EX CREA: 1.0000 "application " | TOPICAL_CREAM | Freq: Every day | CUTANEOUS | 1 refills | Status: DC

## 2019-11-11 NOTE — Telephone Encounter (Signed)
Spoke with pt concerning her medication refills. Medication has been refilled and sent to her pharmacy. Pt is aware that her medication has been refilled.

## 2019-11-11 NOTE — Telephone Encounter (Signed)
Pt called in and stated that she called for a refill the other day and this was not filed. The pt is requesting a refill on hydroquinone[facility]. Please advise

## 2019-11-22 ENCOUNTER — Other Ambulatory Visit: Payer: Self-pay | Admitting: Family Medicine

## 2019-11-22 MED ORDER — OXYCODONE HCL 15 MG PO TABS: 15.0000 mg | ORAL_TABLET | ORAL | 0 refills | Status: DC | PRN

## 2019-11-22 NOTE — Telephone Encounter (Signed)
Requested medication (s) are due for refill today: yes  Requested medication (s) are on the active medication list: yes  Last refill:  10/21/19  Future visit scheduled: no  Notes to clinic:  not delegated    Requested Prescriptions  Pending Prescriptions Disp Refills   oxyCODONE (ROXICODONE) 15 MG immediate release tablet 180 tablet 0    Sig: Take 1-2 tablets (15-30 mg total) by mouth every 4 (four) hours as needed for pain.      Not Delegated - Analgesics:  Opioid Agonists Failed - 11/22/2019 12:52 PM      Failed - This refill cannot be delegated      Failed - Urine Drug Screen completed in last 360 days.      Passed - Valid encounter within last 6 months    Recent Outpatient Visits           1 year ago Annual physical exam   [facility] Birdie Sons, MD   2 years ago Elevated blood pressure reading   [facility] Birdie Sons, MD   2 years ago Essential hypertension   [facility] Birdie Sons, MD   3 years ago Cough   [facility] Birdie Sons, MD   3 years ago Cough   [facility] Birdie Sons, MD       Future Appointments             In 85 months Rubie Maid, MD [facility]

## 2019-11-22 NOTE — Telephone Encounter (Signed)
Medication Refill - Medication: oxyCODONE (ROXICODONE) 15 MG immediate release tablet    Has the patient contacted their pharmacy? Yes.   (Agent: If no, request that the patient contact the pharmacy for the refill.) (Agent: If yes, when and what did the pharmacy advise?)  Preferred Pharmacy (with phone number or street name):  [place], [place] [address]  [place] [address]  Phone: [phone] Fax: [phone]      Agent: Please be advised that RX refills may take up to 3 business days. We ask that you follow-up with your pharmacy.

## 2019-12-09 ENCOUNTER — Other Ambulatory Visit: Payer: Self-pay | Admitting: Family Medicine

## 2019-12-13 DIAGNOSIS — M5134 Other intervertebral disc degeneration, thoracic region: Secondary | ICD-10-CM | POA: Diagnosis not present

## 2019-12-13 DIAGNOSIS — M958 Other specified acquired deformities of musculoskeletal system: Secondary | ICD-10-CM | POA: Diagnosis not present

## 2019-12-13 DIAGNOSIS — G8929 Other chronic pain: Secondary | ICD-10-CM | POA: Diagnosis not present

## 2019-12-13 DIAGNOSIS — Z79899 Other long term (current) drug therapy: Secondary | ICD-10-CM | POA: Diagnosis not present

## 2019-12-13 DIAGNOSIS — M419 Scoliosis, unspecified: Secondary | ICD-10-CM | POA: Diagnosis not present

## 2019-12-13 DIAGNOSIS — M170 Bilateral primary osteoarthritis of knee: Secondary | ICD-10-CM | POA: Diagnosis not present

## 2019-12-13 DIAGNOSIS — M542 Cervicalgia: Secondary | ICD-10-CM | POA: Diagnosis not present

## 2019-12-13 DIAGNOSIS — M160 Bilateral primary osteoarthritis of hip: Secondary | ICD-10-CM | POA: Diagnosis not present

## 2019-12-13 DIAGNOSIS — Z981 Arthrodesis status: Secondary | ICD-10-CM | POA: Diagnosis not present

## 2019-12-13 DIAGNOSIS — M4312 Spondylolisthesis, cervical region: Secondary | ICD-10-CM | POA: Diagnosis not present

## 2019-12-13 DIAGNOSIS — M549 Dorsalgia, unspecified: Secondary | ICD-10-CM | POA: Diagnosis not present

## 2019-12-13 DIAGNOSIS — M4185 Other forms of scoliosis, thoracolumbar region: Secondary | ICD-10-CM | POA: Diagnosis not present

## 2019-12-13 DIAGNOSIS — M5136 Other intervertebral disc degeneration, lumbar region: Secondary | ICD-10-CM | POA: Diagnosis not present

## 2019-12-13 DIAGNOSIS — M5090 Cervical disc disorder, unspecified, unspecified cervical region: Secondary | ICD-10-CM | POA: Diagnosis not present

## 2019-12-13 DIAGNOSIS — M47812 Spondylosis without myelopathy or radiculopathy, cervical region: Secondary | ICD-10-CM | POA: Diagnosis not present

## 2019-12-13 DIAGNOSIS — Z9889 Other specified postprocedural states: Secondary | ICD-10-CM | POA: Diagnosis not present

## 2019-12-13 DIAGNOSIS — M47816 Spondylosis without myelopathy or radiculopathy, lumbar region: Secondary | ICD-10-CM | POA: Diagnosis not present

## 2019-12-13 DIAGNOSIS — G039 Meningitis, unspecified: Secondary | ICD-10-CM | POA: Diagnosis not present

## 2019-12-13 DIAGNOSIS — M5416 Radiculopathy, lumbar region: Secondary | ICD-10-CM | POA: Diagnosis not present

## 2019-12-17 ENCOUNTER — Telehealth: Payer: Self-pay | Admitting: Family Medicine

## 2019-12-17 NOTE — Telephone Encounter (Signed)
Copied from [place] [phone]. Topic: General - Other >> Dec 16, 2019  4:58 PM Rainey Pines A wrote: Patient is wanting to get a referral from Dr. Caryn Section to Dr. Haynes Hoehn in regards to not being able to swallow food properly. Patient stated it seems as though her food doesn't want to go down. Please advise

## 2019-12-19 NOTE — Telephone Encounter (Signed)
Patient called, she says she has an upcoming GI appointment and will discuss the swallowing with the provider there.

## 2019-12-19 NOTE — Telephone Encounter (Signed)
Attempted to contact patient, no answer left a voicemail. Patient will need a OV for referral.  Okay for PEC to advise patient and schedule.

## 2019-12-22 ENCOUNTER — Other Ambulatory Visit: Payer: Self-pay | Admitting: Family Medicine

## 2019-12-22 MED ORDER — OXYCODONE HCL 15 MG PO TABS: 15.0000 mg | ORAL_TABLET | ORAL | 0 refills | Status: DC | PRN

## 2019-12-22 NOTE — Telephone Encounter (Signed)
oxyCODONE (ROXICODONE) 15 MG immediate release tablet     Patient is requesting a refill.    Pharmacy:  [place], [place] Phone:  [phone]  Fax:  [phone]

## 2019-12-22 NOTE — Telephone Encounter (Signed)
Requested medication (s) are due for refill today: yes  Requested medication (s) are on the active medication list: yes  Last refill:  11/22/19  Future visit scheduled: no  Notes to clinic:  not delegated    Requested Prescriptions  Pending Prescriptions Disp Refills   oxyCODONE (ROXICODONE) 15 MG immediate release tablet 180 tablet 0    Sig: Take 1-2 tablets (15-30 mg total) by mouth every 4 (four) hours as needed for pain.      Not Delegated - Analgesics:  Opioid Agonists Failed - 12/22/2019  2:30 PM      Failed - This refill cannot be delegated      Failed - Urine Drug Screen completed in last 360 days.      Passed - Valid encounter within last 6 months    Recent Outpatient Visits           1 year ago Annual physical exam   [facility] Birdie Sons, MD   2 years ago Elevated blood pressure reading   [facility] Birdie Sons, MD   2 years ago Essential hypertension   [facility] Birdie Sons, MD   3 years ago Cough   [facility] Birdie Sons, MD   3 years ago Cough   [facility] Birdie Sons, MD       Future Appointments             In 10 months Rubie Maid, MD [facility]

## 2020-01-04 ENCOUNTER — Other Ambulatory Visit: Payer: Self-pay

## 2020-01-04 ENCOUNTER — Ambulatory Visit (INDEPENDENT_AMBULATORY_CARE_PROVIDER_SITE_OTHER): Payer: Medicare Other | Admitting: Gastroenterology

## 2020-01-04 VITALS — BP 142/84 | HR 86 | Temp 97.6°F | Ht 68.0 in | Wt 184.0 lb

## 2020-01-04 DIAGNOSIS — Z86010 Personal history of colonic polyps: Secondary | ICD-10-CM

## 2020-01-04 DIAGNOSIS — K642 Third degree hemorrhoids: Secondary | ICD-10-CM

## 2020-01-04 DIAGNOSIS — K5900 Constipation, unspecified: Secondary | ICD-10-CM | POA: Diagnosis not present

## 2020-01-04 NOTE — Progress Notes (Signed)
Jonathon Bellows MD, MRCP(U.K) [address]  [place]  [place], [place] [address]  Main: [phone]  Fax: [phone]   Gastroenterology Consultation  Referring Provider:     Birdie Sons, MD Primary Care Physician:  Birdie Sons, MD Primary Gastroenterologist:  Dr. Jonathon Bellows  Reason for Consultation:     Hemorrhoids        HPI:   Courtney Ross is a 64 y.o. y/o female referred for consultation & management  by Dr. Caryn Section, Kirstie Peri, MD.  States that she has had external hemorrhoids for a while.  The for some constipation on Linzess 145 mcg.  Sometimes looks well but sometimes does not have a bowel movement for up to 4 days.  Associated with occasional bleeding.  No other symptoms.  Recollect she has had colon polyps in the past.  Colonoscopy was in July 2014 and  online report suggest repeating in 5 years for surveillance which I presume indicative of prior history of colon polyps.  Past Medical History:  Diagnosis Date  . Anxiety   . Back pain   . Colon polyp   . Constipation   . Degenerative disc disease, lumbar   . Depression   . Family history of adverse reaction to anesthesia    daughter nausea  . Fatty liver   . GERD (gastroesophageal reflux disease)   . H/O spinal fusion   . Headache    migraines  . Headache, migraine 05/08/2015  . High cholesterol   . Hypertension   . Lower back pain   . Polycystic ovarian disease   . PONV (postoperative nausea and vomiting)    due to surgery from the 1990's  . Restless leg syndrome   . Spasm 01/29/2009    Past Surgical History:  Procedure Laterality Date  . [place]  . [place]ENDECTOMY  1988  . ARTHRODESIS METATARSALPHALANGEAL JOINT (MTPJ) Left 12/14/2014   Procedure: ARTHRODESIS METATARSALPHALANGEAL JOINT (MTPJ) 1ST;  Surgeon: Albertine Patricia, DPM;  Location: [facility];  Service: Podiatry;  Laterality: Left;  LMA  . back fusion  2004   lumbar region. had broken facet joint.     Marland Kitchen BACK SURGERY  [phone]   x3  . BLADDER SUSPENSION  2006  . BREAST BIOPSY Left 1990's   core Sankar  . BREAST SURGERY  2005   lumpectomy  . CARPAL TUNNEL RELEASE Bilateral 02/26/07,05/21/07  . CHOLECYSTECTOMY N/A 05/03/2018   Procedure: LAPAROSCOPIC CHOLECYSTECTOMY WITH INTRAOPERATIVE CHOLANGIOGRAM;  Surgeon: Robert Bellow, MD;  Location: [facility];  Service: General;  Laterality: N/A;  . COLONOSCOPY    . COLPORRHAPHY    . DILATION AND CURETTAGE OF UTERUS    . [place]  . FOOT SURGERY Left 2017   fusion on big toe, hammer toe, bunions  . HAMMER TOE SURGERY Left 03/22/2015   Procedure: HAMMER TOE CORRECTION L 2ND AND 3RD ;  Surgeon: Albertine Patricia, DPM;  Location: [facility];  Service: Podiatry;  Laterality: Left;  WITH LOCAL  . HERNIA REPAIR Right 1988   inguinal  . LAMINECTOMY  1993, 1994  . NECK SURGERY  2006   fusion  . RECTOCELE REPAIR N/A 08/25/2016   Procedure: POSTERIOR REPAIR (RECTOCELE);  Surgeon: Brayton Mars, MD;  Location: [facility];  Service: Gynecology;  Laterality: N/A;  . SPINAL CORD STIMULATOR BATTERY EXCHANGE Right 04/08/2019   Procedure: SPINAL CORD STIMULATOR BATTERY EXCHANGE;  Surgeon: Clydell Hakim, MD;  Location: [facility];  Service: Neurosurgery;  Laterality: Right;  SPINAL CORD STIMULATOR BATTERY EXCHANGE  . SPINAL CORD STIMULATOR IMPLANT  2014   stimulator in the ileo inguinal nerve due to scar tissue in abdomen  . TONSILLECTOMY  1985  . VENTRAL HERNIA REPAIR N/A 09/01/2018   6.4 cm Ventra lite mesh. HERNIA REPAIR VENTRAL ADULT;  Surgeon: Robert Bellow, MD;  Location: [facility];  Service: General;  Laterality: N/A;    Prior to Admission medications   Medication Sig Start Date End Date Taking? Authorizing Provider  aspirin EC 81 MG tablet Take 81 mg by mouth at bedtime.    [provider]  buPROPion [facility] SR) 150 MG 12 hr tablet TAKE ONE TABLET 3 TIMES DAILY 10/06/19   Birdie Sons, MD   Cholecalciferol (VITAMIN D3) 5000 UNITS CAPS Take 5,000 Units by mouth daily.     [provider]  Coenzyme Q10 200 MG capsule Take 200 mg by mouth daily.    [provider]  cyanocobalamin (,VITAMIN B-12,) 1000 MCG/ML injection Inject 1,000 mcg into the muscle every 30 (thirty) days. Patient not taking: Reported on 01/04/2020    [provider]  cyclobenzaprine (FLEXERIL) 10 MG tablet Take 1 tablet (10 mg total) by mouth 3 (three) times daily as needed for muscle spasms. 10/26/18   Birdie Sons, MD  diazepam (VALIUM) 10 MG tablet TAKE ONE TABLET EVERY 6 HOURS AS NEEDED FOR ANXIETY 10/24/19   Birdie Sons, MD  diclofenac Sodium (VOLTAREN) 1 % GEL SMARTSIG:1 Gram(s) Topical 4 Times Daily PRN 06/13/19   [provider]  estradiol (ESTRACE) 1 MG tablet TAKE ONE TABLET EVERY DAY 11/04/19   Birdie Sons, MD  fluticasone (FLONASE) 50 MCG/ACT nasal spray Place 1 spray into both nostrils daily as needed for allergies or rhinitis.    [provider]  hydroquinone 4 % cream Apply 1 application topically daily. 11/11/19   Rubie Maid, MD  LINZESS 145 MCG CAPS capsule TAKE 1 CAPSULE EVERY DAY 04/08/19   Birdie Sons, MD  lubiprostone (AMITIZA) 24 MCG capsule TAKE 1 CAPSULE BY MOUTH TWICE DAILY WITHA MEAL 10/06/19   Birdie Sons, MD  meloxicam (MOBIC) 15 MG tablet Take 15 mg by mouth at bedtime.  05/11/15   [provider]  Multiple Vitamin (MULTIVITAMIN WITH MINERALS) TABS tablet Take 1 tablet by mouth daily.    [provider]  mupirocin cream (BACTROBAN) 2 % APPLY TO AFFECTED AREAS TOPICALLY TWICE DAILY 07/07/19   Birdie Sons, MD  omeprazole (PRILOSEC) 40 MG capsule Take 1 capsule (40 mg total) by mouth daily. Patient taking differently: Take 40 mg by mouth daily as needed (heartburn).  03/05/18   Vickie Epley, MD  oxyCODONE (ROXICODONE) 15 MG immediate release tablet Take 1-2 tablets (15-30 mg total) by mouth every 4  (four) hours as needed for pain. 12/22/19   Birdie Sons, MD  phentermine 37.5 MG capsule Take 1 capsule (37.5 mg total) by mouth every morning. 11/09/19   Rubie Maid, MD  polyethylene glycol (MIRALAX / GLYCOLAX) packet Take 17 g by mouth 2 (two) times daily as needed for mild constipation (dissolve in an 8 oz glass of water). 07/21/18   Birdie Sons, MD  pregabalin (LYRICA) 75 MG capsule Take 1 capsule (75 mg total) by mouth 3 (three) times daily. 08/08/19   Birdie Sons, MD  RESTASIS 0.05 % ophthalmic emulsion Place 1 drop into both eyes 2 (two) times  daily.  11/21/15   [provider]  rOPINIRole (REQUIP) 2 MG tablet TAKE 1 TABLET BY MOUTH AT BEDTIME 12/09/19   Birdie Sons, MD  rosuvastatin (CRESTOR) 10 MG tablet TAKE ONE TABLET EVERY DAY 11/04/19   Birdie Sons, MD  Turmeric 500 MG CAPS Take 500 mg by mouth daily.  05/05/19   [provider]    Family History  Problem Relation Age of Onset  . Diabetes Father   . Hypertension Father   . CAD Father   . Diabetes Sister        non-Insulin dependent diabetes mellitus  . Heart disease Brother   . Cancer Daughter        cancer of the cervix at age 56-18  . Heart disease Brother   . Breast cancer Paternal Aunt   . Colon cancer Paternal Uncle   . Ovarian cancer Neg Hx      Social History   Tobacco Use  . Smoking status: Never Smoker  . Smokeless tobacco: Never Used  Vaping Use  . Vaping Use: Never used  Substance Use Topics  . Alcohol use: No    Alcohol/week: 0.0 standard drinks  . Drug use: No    Allergies as of 01/04/2020 - Review Complete 01/04/2020  Allergen Reaction Noted  . Hydrocodone Itching 12/05/2014  . Lisinopril Cough 10/29/2016  . Mirtazapine  05/29/2015  . Other Nausea And Vomiting 04/28/2018    Review of Systems:    All systems reviewed and negative except where noted in HPI.   Physical Exam:  BP (!) 142/84   Pulse 86   Temp 97.6 F (36.4 C)   Ht 5\' 8"  (1.727 m)   Wt  184 lb (83.5 kg)   BMI 27.98 kg/m  No LMP recorded. Patient has had a hysterectomy. Psych:  Alert and cooperative. Normal mood and affect. General:   Alert,  Well-developed, well-nourished, pleasant and cooperative in NAD Head:  Normocephalic and atraumatic. Eyes:  Sclera clear, no icterus.   Conjunctiva pink. Lungs:  Respirations even and unlabored.  Clear throughout to auscultation.   No wheezes, crackles, or rhonchi. No acute distress. Heart:  Regular rate and rhythm; no murmurs, clicks, rubs, or gallops. Abdomen:  Normal bowel sounds.  No bruits.  Soft, non-tender and non-distended without masses, hepatosplenomegaly or hernias noted.  No guarding or rebound tenderness.    Neurologic:  Alert and oriented x3;  grossly normal neurologically. Psych:  Alert and cooperative. Normal mood and affect.  Imaging Studies: No results found.  Assessment and Plan:   Courtney Ross is a 64 y.o. y/o female has been referred for issues with hemorrhoids.  She suffers from chronic constipation.  On Linzess 145 mcg.  Does not seem to be working completely.  Last colonoscopy was over 7 years back.  She is due for surveillance due to prior history of colon polyps.  Plan 1.  Increase Linzess to 290 mcg.  2-week samples will be provided. 2.  High-fiber diet. 3.  Counseled about perianal hygiene and internal hemorrhoids.  Patient information  will be provided. 5.  Surveillance colonoscopy due to prior history of colon polyps. 6.  Based on endoscopy and external digital exam on her colonoscopy will discuss about banding which I have talked to her briefly about today  I have discussed alternative options, risks & benefits,  which include, but are not limited to, bleeding, infection, perforation,respiratory complication & drug reaction.  The patient agrees with this plan & written  consent will be obtained.     Follow up in 8 weeks  Dr Jonathon Bellows MD,MRCP(U.K)

## 2020-01-04 NOTE — Patient Instructions (Signed)

## 2020-01-06 ENCOUNTER — Other Ambulatory Visit: Admission: RE | Admit: 2020-01-06 | Payer: Medicare Other | Source: Ambulatory Visit

## 2020-01-06 ENCOUNTER — Other Ambulatory Visit: Payer: Self-pay | Admitting: Family Medicine

## 2020-01-06 DIAGNOSIS — F324 Major depressive disorder, single episode, in partial remission: Secondary | ICD-10-CM

## 2020-01-06 NOTE — Telephone Encounter (Signed)
Requested medication (s) are due for refill today: yes  Requested medication (s) are on the active medication list:yes  Last refill:  bupropion 10/06/19 #90  3 refills, Estradiol 11/04/19  #90  0 refills, Pregabalin 08/08/19 #90  5 refills  Future visit scheduled: No   Notes to clinic:  called patient left VM . Needs OV last visit medicare wellness 07/05/19.  Last CPE 07/07/18    Requested Prescriptions  Pending Prescriptions Disp Refills   buPROPion (WELLBUTRIN SR) 150 MG 12 hr tablet [Pharmacy Med Name: BUPROPION HCL ER (SR) 150 MG TAB] 90 tablet 3    Sig: TAKE ONE TABLET 3 TIMES DAILY      Psychiatry: Antidepressants - bupropion Failed - 01/06/2020 11:59 AM      Failed - Last BP in normal range    BP Readings from Last 1 Encounters:  01/04/20 (!) 142/84          Failed - Valid encounter within last 6 months    Recent Outpatient Visits           1 year ago Annual physical exam   [facility] Birdie Sons, MD   2 years ago Elevated blood pressure reading   [facility] Birdie Sons, MD   2 years ago Essential hypertension   [facility] Birdie Sons, MD   3 years ago Cough   [facility] Birdie Sons, MD   3 years ago Cough   [facility], [facility]       Future Appointments             In 2 months Jonathon Bellows, MD [place]   In 10 months Rubie Maid, MD [facility] - Completed PHQ-2 or PHQ-9 in the last 360 days.        estradiol (ESTRACE) 1 MG tablet [Pharmacy Med Name: ESTRADIOL 1 MG TAB] 90 tablet 0    Sig: TAKE ONE TABLET EVERY DAY      OB/GYN:  Estrogens Failed - 01/06/2020 11:59 AM      Failed - Last BP in normal range    BP Readings from Last 1 Encounters:  01/04/20 (!) 142/84          Passed - Mammogram is up-to-date per Health Maintenance      Passed - Valid encounter within last 12 months    Recent  Outpatient Visits           1 year ago Annual physical exam   [facility] Birdie Sons, MD   2 years ago Elevated blood pressure reading   [facility] Birdie Sons, MD   2 years ago Essential hypertension   [facility] Birdie Sons, MD   3 years ago Cough   [facility] Birdie Sons, MD   3 years ago Cough   [facility] Birdie Sons, MD       Future Appointments             In 2 months Jonathon Bellows, MD [place]   In 10 months Rubie Maid, MD [facility]              pregabalin (LYRICA) 75 MG capsule [Pharmacy Med Name: PREGABALIN 75 MG CAP] 90 capsule     Sig: TAKE 1 CAPSULE BY MOUTH 3 TIMES DAILY  Not Delegated - Neurology:  Anticonvulsants - Controlled Failed - 01/06/2020 11:59 AM      Failed - This refill cannot be delegated      Passed - Valid encounter within last 12 months    Recent Outpatient Visits           1 year ago Annual physical exam   [facility] Birdie Sons, MD   2 years ago Elevated blood pressure reading   [facility] Birdie Sons, MD   2 years ago Essential hypertension   [facility] Birdie Sons, MD   3 years ago Cough   [facility] Birdie Sons, MD   3 years ago Cough   [facility] Birdie Sons, MD       Future Appointments             In 2 months Jonathon Bellows, MD [place]   In 10 months Rubie Maid, MD [facility]

## 2020-01-09 ENCOUNTER — Other Ambulatory Visit: Payer: Self-pay

## 2020-01-09 ENCOUNTER — Encounter: Payer: Self-pay | Admitting: Gastroenterology

## 2020-01-09 ENCOUNTER — Other Ambulatory Visit
Admission: RE | Admit: 2020-01-09 | Discharge: 2020-01-09 | Disposition: A | Discharge status: Home / Self Care | Payer: Medicare Other | Source: Ambulatory Visit | Attending: Gastroenterology | Admitting: Gastroenterology

## 2020-01-09 DIAGNOSIS — Z20822 Contact with and (suspected) exposure to covid-19: Secondary | ICD-10-CM | POA: Insufficient documentation

## 2020-01-09 DIAGNOSIS — Z01812 Encounter for preprocedural laboratory examination: Secondary | ICD-10-CM | POA: Insufficient documentation

## 2020-01-09 LAB — SARS CORONAVIRUS 2 (TAT 6-24 HRS): SARS Coronavirus 2: NEGATIVE

## 2020-01-10 ENCOUNTER — Ambulatory Visit: Payer: Medicare Other | Admitting: Anesthesiology

## 2020-01-10 ENCOUNTER — Ambulatory Visit
Admission: RE | Admit: 2020-01-10 | Discharge: 2020-01-10 | Disposition: A | Discharge status: Home / Self Care | Payer: Medicare Other | Attending: Gastroenterology | Admitting: Gastroenterology

## 2020-01-10 ENCOUNTER — Other Ambulatory Visit: Payer: Self-pay

## 2020-01-10 ENCOUNTER — Encounter
Admission: RE | Disposition: A | Discharge status: Home / Self Care | Payer: Self-pay | Source: Home / Self Care | Attending: Gastroenterology

## 2020-01-10 ENCOUNTER — Encounter: Payer: Self-pay | Admitting: Gastroenterology

## 2020-01-10 DIAGNOSIS — F419 Anxiety disorder, unspecified: Secondary | ICD-10-CM | POA: Insufficient documentation

## 2020-01-10 DIAGNOSIS — G2581 Restless legs syndrome: Secondary | ICD-10-CM | POA: Insufficient documentation

## 2020-01-10 DIAGNOSIS — K648 Other hemorrhoids: Secondary | ICD-10-CM | POA: Diagnosis not present

## 2020-01-10 DIAGNOSIS — I10 Essential (primary) hypertension: Secondary | ICD-10-CM | POA: Insufficient documentation

## 2020-01-10 DIAGNOSIS — K760 Fatty (change of) liver, not elsewhere classified: Secondary | ICD-10-CM | POA: Insufficient documentation

## 2020-01-10 DIAGNOSIS — Z7689 Persons encountering health services in other specified circumstances: Secondary | ICD-10-CM | POA: Diagnosis not present

## 2020-01-10 DIAGNOSIS — D122 Benign neoplasm of ascending colon: Secondary | ICD-10-CM | POA: Insufficient documentation

## 2020-01-10 DIAGNOSIS — K641 Second degree hemorrhoids: Secondary | ICD-10-CM | POA: Insufficient documentation

## 2020-01-10 DIAGNOSIS — K219 Gastro-esophageal reflux disease without esophagitis: Secondary | ICD-10-CM | POA: Diagnosis not present

## 2020-01-10 DIAGNOSIS — E7800 Pure hypercholesterolemia, unspecified: Secondary | ICD-10-CM | POA: Diagnosis not present

## 2020-01-10 DIAGNOSIS — D120 Benign neoplasm of cecum: Secondary | ICD-10-CM | POA: Insufficient documentation

## 2020-01-10 DIAGNOSIS — D123 Benign neoplasm of transverse colon: Secondary | ICD-10-CM | POA: Insufficient documentation

## 2020-01-10 DIAGNOSIS — Z86010 Personal history of colonic polyps: Secondary | ICD-10-CM | POA: Diagnosis not present

## 2020-01-10 DIAGNOSIS — Z833 Family history of diabetes mellitus: Secondary | ICD-10-CM | POA: Diagnosis not present

## 2020-01-10 DIAGNOSIS — Z1211 Encounter for screening for malignant neoplasm of colon: Secondary | ICD-10-CM | POA: Diagnosis not present

## 2020-01-10 DIAGNOSIS — D126 Benign neoplasm of colon, unspecified: Secondary | ICD-10-CM | POA: Diagnosis not present

## 2020-01-10 DIAGNOSIS — Z7982 Long term (current) use of aspirin: Secondary | ICD-10-CM | POA: Insufficient documentation

## 2020-01-10 DIAGNOSIS — Z79899 Other long term (current) drug therapy: Secondary | ICD-10-CM | POA: Diagnosis not present

## 2020-01-10 DIAGNOSIS — K5790 Diverticulosis of intestine, part unspecified, without perforation or abscess without bleeding: Secondary | ICD-10-CM | POA: Diagnosis not present

## 2020-01-10 DIAGNOSIS — K635 Polyp of colon: Secondary | ICD-10-CM | POA: Diagnosis not present

## 2020-01-10 DIAGNOSIS — F329 Major depressive disorder, single episode, unspecified: Secondary | ICD-10-CM | POA: Insufficient documentation

## 2020-01-10 HISTORY — PX: COLONOSCOPY WITH PROPOFOL: SHX5780

## 2020-01-10 SURGERY — COLONOSCOPY WITH PROPOFOL
Anesthesia: General

## 2020-01-10 MED ORDER — SODIUM CHLORIDE 0.9 % IV SOLN
INTRAVENOUS | Status: DC
  Administered 2020-01-10: 1000 mL via INTRAVENOUS

## 2020-01-10 MED ORDER — LIDOCAINE HCL (PF) 2 % IJ SOLN
INTRAMUSCULAR | Status: AC
  Filled 2020-01-10: qty 5

## 2020-01-10 MED ORDER — PROPOFOL 500 MG/50ML IV EMUL
INTRAVENOUS | Status: DC | PRN
  Administered 2020-01-10: 111 ug/kg/min via INTRAVENOUS

## 2020-01-10 MED ORDER — LIDOCAINE HCL (CARDIAC) PF 100 MG/5ML IV SOSY
PREFILLED_SYRINGE | INTRAVENOUS | Status: DC | PRN
  Administered 2020-01-10: 100 mg via INTRAVENOUS

## 2020-01-10 MED ORDER — PROPOFOL 500 MG/50ML IV EMUL
INTRAVENOUS | Status: AC
  Filled 2020-01-10: qty 50

## 2020-01-10 NOTE — H&P (Signed)
Jonathon Bellows, MD [address] [place], [place], [place], [address] [address], [place], [place], [place], [address] Phone: [phone]  Fax: [phone]  Primary Care Physician:  Birdie Sons, MD   Pre-Procedure History & Physical: HPI:  Courtney Ross is a 64 y.o. female is here for an colonoscopy.   Past Medical History:  Diagnosis Date  . Anxiety   . Back pain   . Colon polyp   . Constipation   . Degenerative disc disease, lumbar   . Depression   . Family history of adverse reaction to anesthesia    daughter nausea  . Fatty liver   . GERD (gastroesophageal reflux disease)   . H/O spinal fusion   . Headache    migraines  . Headache, migraine 05/08/2015  . High cholesterol   . Hypertension   . Lower back pain   . Polycystic ovarian disease   . PONV (postoperative nausea and vomiting)    due to surgery from the 1990's  . Restless leg syndrome   . Spasm 01/29/2009    Past Surgical History:  Procedure Laterality Date  . [place]      [place]  . APPENDECTOMY  1988  . ARTHRODESIS METATARSALPHALANGEAL JOINT (MTPJ) Left 12/14/2014   Procedure: ARTHRODESIS METATARSALPHALANGEAL JOINT (MTPJ) 1ST;  Surgeon: Albertine Patricia, DPM;  Location: [facility];  Service: Podiatry;  Laterality: Left;  LMA  . back fusion  2004   lumbar region. had broken facet joint.   Mar[facility] Kitchen BACK SURGERY  [phone]   x3  . BLADDER SUSPENSION  2006  . BREAST BIOPSY Left 1990's   core Sankar  . BREAST SURGERY  2005   lumpectomy  . CARPAL TUNNEL RELEASE Bilateral 02/26/07,05/21/07  . CHOLECYSTECTOMY N/A 05/03/2018   Procedure: LAPAROSCOPIC CHOLECYSTECTOMY WITH INTRAOPERATIVE CHOLANGIOGRAM;  Surgeon: Robert Bellow, MD;  Location: [facility];  Service: General;  Laterality: N/A;  . COLONOSCOPY    . COLPORRHAPHY    . DILATION AND CURETTAGE OF UTERUS    . [place]  . FOOT SURGERY Left 2017   fusion on big toe, hammer toe, bunions  . HAMMER TOE  SURGERY Left 03/22/2015   Procedure: HAMMER TOE CORRECTION L 2ND AND 3RD ;  Surgeon: Albertine Patricia, DPM;  Location: [facility];  Service: Podiatry;  Laterality: Left;  WITH LOCAL  . HERNIA REPAIR Right 1988   inguinal  . LAMINECTOMY  1993, 1994  . NECK SURGERY  2006   fusion  . RECTOCELE REPAIR N/A 08/25/2016   Procedure: POSTERIOR REPAIR (RECTOCELE);  Surgeon: Brayton Mars, MD;  Location: [facility];  Service: Gynecology;  Laterality: N/A;  . SPINAL CORD STIMULATOR BATTERY EXCHANGE Right 04/08/2019   Procedure: SPINAL CORD STIMULATOR BATTERY EXCHANGE;  Surgeon: Clydell Hakim, MD;  Location: [facility];  Service: Neurosurgery;  Laterality: Right;  SPINAL CORD STIMULATOR BATTERY EXCHANGE  . SPINAL CORD STIMULATOR IMPLANT  2014   stimulator in the ileo inguinal nerve due to scar tissue in abdomen  . TONSILLECTOMY  1985  . VENTRAL HERNIA REPAIR N/A 09/01/2018   6.4 cm Ventra lite mesh. HERNIA REPAIR VENTRAL ADULT;  Surgeon: Robert Bellow, MD;  Location: [facility];  Service: General;  Laterality: N/A;    Prior to Admission medications   Medication Sig Start Date End Date Taking? Authorizing Provider  aspirin EC 81 MG tablet Take 81 mg by mouth at bedtime.    [provider]  buPROPion [facility] SR) 150 MG  12 hr tablet TAKE ONE TABLET 3 TIMES DAILY 01/06/20   Birdie Sons, MD  Cholecalciferol (VITAMIN D3) 5000 UNITS CAPS Take 5,000 Units by mouth daily.     [provider]  Coenzyme Q10 200 MG capsule Take 200 mg by mouth daily.    [provider]  cyclobenzaprine (FLEXERIL) 10 MG tablet Take 1 tablet (10 mg total) by mouth 3 (three) times daily as needed for muscle spasms. 10/26/18   Birdie Sons, MD  diazepam (VALIUM) 10 MG tablet TAKE ONE TABLET EVERY 6 HOURS AS NEEDED FOR ANXIETY 10/24/19   Birdie Sons, MD  diclofenac Sodium (VOLTAREN) 1 % GEL SMARTSIG:1 Gram(s) Topical 4 Times Daily PRN 06/13/19   [provider]  estradiol  (ESTRACE) 1 MG tablet TAKE ONE TABLET EVERY DAY 11/04/19   Birdie Sons, MD  fluticasone (FLONASE) 50 MCG/ACT nasal spray Place 1 spray into both nostrils daily as needed for allergies or rhinitis.    [provider]  hydroquinone 4 % cream Apply 1 application topically daily. 11/11/19   Rubie Maid, MD  LINZESS 145 MCG CAPS capsule TAKE 1 CAPSULE EVERY DAY 04/08/19   Birdie Sons, MD  meloxicam (MOBIC) 15 MG tablet Take 15 mg by mouth at bedtime.  05/11/15   [provider]  Multiple Vitamin (MULTIVITAMIN WITH MINERALS) TABS tablet Take 1 tablet by mouth daily.    [provider]  mupirocin cream (BACTROBAN) 2 % APPLY TO AFFECTED AREAS TOPICALLY TWICE DAILY 07/07/19   Birdie Sons, MD  omeprazole (PRILOSEC) 40 MG capsule Take 1 capsule (40 mg total) by mouth daily. Patient taking differently: Take 40 mg by mouth daily as needed (heartburn).  03/05/18   Vickie Epley, MD  oxyCODONE (ROXICODONE) 15 MG immediate release tablet Take 1-2 tablets (15-30 mg total) by mouth every 4 (four) hours as needed for pain. 12/22/19   Birdie Sons, MD  phentermine 37.5 MG capsule Take 1 capsule (37.5 mg total) by mouth every morning. 11/09/19   Rubie Maid, MD  polyethylene glycol (MIRALAX / GLYCOLAX) packet Take 17 g by mouth 2 (two) times daily as needed for mild constipation (dissolve in an 8 oz glass of water). 07/21/18   Birdie Sons, MD  pregabalin (LYRICA) 75 MG capsule TAKE 1 CAPSULE BY MOUTH 3 TIMES DAILY 01/06/20   Birdie Sons, MD  RESTASIS 0.05 % ophthalmic emulsion Place 1 drop into both eyes 2 (two) times daily.  11/21/15   [provider]  rOPINIRole (REQUIP) 2 MG tablet TAKE 1 TABLET BY MOUTH AT BEDTIME 12/09/19   Birdie Sons, MD  rosuvastatin (CRESTOR) 10 MG tablet TAKE ONE TABLET EVERY DAY 11/04/19   Birdie Sons, MD  Turmeric 500 MG CAPS Take 500 mg by mouth daily.  05/05/19   [provider]    Allergies as of 01/04/2020  - Review Complete 01/04/2020  Allergen Reaction Noted  . Hydrocodone Itching 12/05/2014  . Lisinopril Cough 10/29/2016  . Mirtazapine  05/29/2015  . Other Nausea And Vomiting 04/28/2018    Family History  Problem Relation Age of Onset  . Diabetes Father   . Hypertension Father   . CAD Father   . Diabetes Sister        non-Insulin dependent diabetes mellitus  . Heart disease Brother   . Cancer Daughter        cancer of the cervix at age 49-18  . Heart disease Brother   .  Breast cancer Paternal Aunt   . Colon cancer Paternal Uncle   . Ovarian cancer Neg Hx     Social History   Socioeconomic History  . Marital status: Legally Separated    Spouse name: Not on file  . Number of children: 4  . Years of education: Not on file  . Highest education level: 9th grade  Occupational History  . 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 Smoking status: Never Smoker  . Smokeless tobacco: Never Used  Vaping Use  . Vaping Use: Never used  Substance and Sexual Activity  . Alcohol use: No    Alcohol/week: 0.0 standard drinks  . Drug use: No  . Sexual activity: Yes    Birth control/protection: Surgical  Other Topics Concern  . Not on file  Social History Narrative   Pt is currently receiving assistance from [employer] and gets food stamps.    Social Determinants of Health   Financial Resource Strain: Low Risk   . Difficulty of Paying Living Expenses: Not hard at all  Food Insecurity: No Food Insecurity  . Worried About Charity fundraiser in the Last Year: Never true  . Ran Out of Food in the Last Year: Never true  Transportation Needs: No Transportation Needs  . Lack of Transportation (Medical): No  . Lack of Transportation (Non-Medical): No  Physical Activity: Inactive  . Days of Exercise per Week: 0 days  . Minutes of Exercise per Session: 0 min  Stress: No Stress Concern Present  . Feeling of Stress : Not at all  Social Connections: Moderately  Isolated  . Frequency of Communication with Friends and Family: More than three times a week  . Frequency of Social Gatherings with Friends and Family: More than three times a week  . Attends Religious Services: More than 4 times per year  . Active Member of Clubs or Organizations: No  . Attends Archivist Meetings: Never  . Marital Status: Separated  Intimate Partner Violence: Not At Risk  . Fear of Current or Ex-Partner: No  . Emotionally Abused: No  . Physically Abused: No  . Sexually Abused: No    Review of Systems: See HPI, otherwise negative ROS  Physical Exam: BP 133/88   Pulse 94   Temp (!) 97 F (36.1 C) (Tympanic)   Resp 18   Ht 5\' 8"  (1.727 m)   Wt 82.1 kg   SpO2 100%   BMI 27.52 kg/m  General:   Alert,  pleasant and cooperative in NAD Head:  Normocephalic and atraumatic. Neck:  Supple; no masses or thyromegaly. Lungs:  Clear throughout to auscultation, normal respiratory effort.    Heart:  +S1, +S2, Regular rate and rhythm, No edema. Abdomen:  Soft, nontender and nondistended. Normal bowel sounds, without guarding, and without rebound.   Neurologic:  Alert and  oriented x4;  grossly normal neurologically.  Impression/Plan: BARBIE CROSTON is here for an colonoscopy to be performed for surveillance due to prior history of colon polyps   Risks, benefits, limitations, and alternatives regarding  colonoscopy have been reviewed with the patient.  Questions have been answered.  All parties agreeable.   Jonathon Bellows, MD  01/10/2020, 9:33 AM

## 2020-01-10 NOTE — Op Note (Signed)
[facility] Gastroenterology Patient Name: Courtney Ross Procedure Date: 01/10/2020 9:39 AM MRN: [id number] Account #: [id number] Date of Birth: December 01, 1955 Admit Type: Outpatient Age: 64 Room: [facility] ENDO ROOM 1 Gender: Female Note Status: Finalized Procedure:             Colonoscopy Indications:           High risk colon cancer surveillance: Personal history                         of colonic polyps, Last colonoscopy: April 2011 Providers:             Jonathon Bellows MD, MD Referring MD:          Kirstie Peri. Caryn Section, MD (Referring MD) Medicines:             Monitored Anesthesia Care Complications:         No immediate complications. Procedure:             Pre-Anesthesia Assessment:                        - Prior to the procedure, a History and Physical was                         performed, and patient medications, allergies and                         sensitivities were reviewed. The patient's tolerance                         of previous anesthesia was reviewed.                        - The risks and benefits of the procedure and the                         sedation options and risks were discussed with the                         patient. All questions were answered and informed                         consent was obtained.                        - ASA Grade Assessment: II - A patient with mild                         systemic disease.                        After obtaining informed consent, the colonoscope was                         passed under direct vision. Throughout the procedure,                         the patient's blood pressure, pulse, and oxygen                         saturations were  monitored continuously. The                         Colonoscope was introduced through the anus and                         advanced to the the cecum, identified by the                         appendiceal orifice. The colonoscopy was performed                         with  ease. The patient tolerated the procedure well.                         The quality of the bowel preparation was excellent. Findings:      Three sessile polyps were found in the transverse colon, ascending colon       and cecum. The polyps were 3 to 5 mm in size. These polyps were removed       with a cold snare. Resection and retrieval were complete.      Non-bleeding internal hemorrhoids were found during retroflexion. The       hemorrhoids were large and Grade II (internal hemorrhoids that prolapse       but reduce spontaneously).      Hemorrhoids were found on perianal exam. Impression:            - Three 3 to 5 mm polyps in the transverse colon, in                         the ascending colon and in the cecum, removed with a                         cold snare. Resected and retrieved.                        - Non-bleeding internal hemorrhoids.                        - Hemorrhoids found on perianal exam. Recommendation:        - Discharge patient to home (with escort).                        - Resume previous diet.                        - Continue present medications.                        - Await pathology results.                        - Repeat colonoscopy for surveillance based on                         pathology results.                        - Return to GI office as previously scheduled. Procedure Code(s):     --- Professional ---  45385, Colonoscopy, flexible; with removal of                         tumor(s), polyp(s), or other lesion(s) by snare                         technique Diagnosis Code(s):     --- Professional ---                        Z86.010, Personal history of colonic polyps                        K63.5, Polyp of colon                        K64.1, Second degree hemorrhoids CPT copyright 2019 American Medical Association. All rights reserved. The codes documented in this report are preliminary and upon coder review may  be revised to  meet current compliance requirements. Jonathon Bellows, MD Jonathon Bellows MD, MD 01/10/2020 10:18:13 AM This report has been signed electronically. Number of Addenda: 0 Note Initiated On: 01/10/2020 9:39 AM Scope Withdrawal Time: 0 hours 11 minutes 43 seconds  Total Procedure Duration: 0 hours 17 minutes 57 seconds  Estimated Blood Loss:  Estimated blood loss: none.      [facility]

## 2020-01-10 NOTE — Anesthesia Preprocedure Evaluation (Signed)
Anesthesia Evaluation  Patient identified by MRN, date of birth, ID band Patient awake    Reviewed: Allergy & Precautions, H&P , NPO status , Patient's Chart, lab work & pertinent test results  History of Anesthesia Complications (+) PONV  Airway Mallampati: II  TM Distance: >3 FB Neck ROM: full    Dental  (+) Upper Dentures, Partial Lower, Implants   Pulmonary neg pulmonary ROS,    Pulmonary exam normal        Cardiovascular hypertension, Normal cardiovascular exam     Neuro/Psych  Headaches, PSYCHIATRIC DISORDERS Anxiety Depression    GI/Hepatic Neg liver ROS, GERD  ,  Endo/Other  Hypothyroidism   Renal/GU negative Renal ROS  negative genitourinary   Musculoskeletal   Abdominal   Peds  Hematology negative hematology ROS (+)   Anesthesia Other Findings Past Medical History: No date: Anxiety No date: Back pain No date: Colon polyp No date: Constipation No date: Degenerative disc disease, lumbar No date: Depression No date: Family history of adverse reaction to anesthesia     Comment:  daughter nausea No date: Fatty liver No date: GERD (gastroesophageal reflux disease) No date: H/O spinal fusion No date: Headache     Comment:  migraines 05/08/2015: Headache, migraine No date: High cholesterol No date: Hypertension No date: Lower back pain No date: Polycystic ovarian disease No date: PONV (postoperative nausea and vomiting)     Comment:  due to surgery from the 1990's No date: Restless leg syndrome 01/29/2009: Spasm  Past Surgical History:  : ABDOMINAL HYSTERECTOMY     Comment:  [place]: APPENDECTOMY 12/14/2014: ARTHRODESIS METATARSALPHALANGEAL JOINT (MTPJ); Left     Comment:  Procedure: ARTHRODESIS METATARSALPHALANGEAL JOINT (MTPJ)              1ST;  Surgeon: Albertine Patricia, DPM;  Location: [place];  Service: Podiatry;  Laterality: Left;  LMA 2004: back fusion      Comment:  lumbar region. had broken facet joint.  [phone]: BACK SURGERY     Comment:  x3 2006: BLADDER SUSPENSION 1990's: BREAST BIOPSY; Left     Comment:  core Sankar 2005: BREAST SURGERY     Comment:  lumpectomy 02/26/07,05/21/07: CARPAL TUNNEL RELEASE; Bilateral 05/03/2018: CHOLECYSTECTOMY; N/A     Comment:  Procedure: LAPAROSCOPIC CHOLECYSTECTOMY WITH               INTRAOPERATIVE CHOLANGIOGRAM;  Surgeon: Robert Bellow, MD;  Location: [facility];  Service: General;                Laterality: N/A; No date: COLONOSCOPY No date: COLPORRHAPHY No date: DILATION AND CURETTAGE OF UTERUS 1982: ECTOPIC PREGNANCY SURGERY 2017: FOOT SURGERY; Left     Comment:  fusion on big toe, hammer toe, bunions 03/22/2015: HAMMER TOE SURGERY; Left     Comment:  Procedure: HAMMER TOE CORRECTION L 2ND AND 3RD ;                Surgeon: Albertine Patricia, DPM;  Location: [place];  Service: Podiatry;  Laterality: Left;  WITH LOCAL 1988: HERNIA REPAIR; Right     Comment:  inguinal 1993, 1994: LAMINECTOMY 2006: NECK SURGERY     Comment:  fusion 08/25/2016: RECTOCELE REPAIR; N/A     Comment:  Procedure: POSTERIOR REPAIR (RECTOCELE);  Surgeon:               Brayton Mars, MD;  Location: [facility];  Service:               Gynecology;  Laterality: N/A; 04/08/2019: SPINAL CORD STIMULATOR BATTERY EXCHANGE; Right     Comment:  Procedure: SPINAL CORD STIMULATOR BATTERY EXCHANGE;                Surgeon: Clydell Hakim, MD;  Location: [place];  Service:               Neurosurgery;  Laterality: Right;  SPINAL CORD STIMULATOR              BATTERY EXCHANGE 2014: SPINAL CORD STIMULATOR IMPLANT     Comment:  stimulator in the ileo inguinal nerve due to scar tissue              in abdomen 1985: TONSILLECTOMY 09/01/2018: VENTRAL HERNIA REPAIR; N/A     Comment:  6.4 cm Ventra lite mesh. HERNIA REPAIR VENTRAL ADULT;                Surgeon: Robert Bellow, MD;  Location:  [facility];                Service: General;  Laterality: N/A;  BMI    Body Mass Index: 27.52 kg/m      Reproductive/Obstetrics negative OB ROS                             Anesthesia Physical Anesthesia Plan  ASA: II  Anesthesia Plan: General   Post-op Pain Management:    Induction:   PONV Risk Score and Plan: Propofol infusion and TIVA  Airway Management Planned: Nasal Cannula  Additional Equipment:   Intra-op Plan:   Post-operative Plan:   Informed Consent: I have reviewed the patients History and Physical, chart, labs and discussed the procedure including the risks, benefits and alternatives for the proposed anesthesia with the patient or authorized representative who has indicated his/her understanding and acceptance.     Dental Advisory Given  Plan Discussed with: Anesthesiologist, CRNA and Surgeon  Anesthesia Plan Comments:         Anesthesia Quick Evaluation

## 2020-01-10 NOTE — Anesthesia Postprocedure Evaluation (Signed)
Anesthesia Post Note  Patient: Courtney Ross  Procedure(s) Performed: COLONOSCOPY WITH PROPOFOL (N/A )  Patient location during evaluation: PACU Anesthesia Type: General Level of consciousness: awake and alert Pain management: pain level controlled Vital Signs Assessment: post-procedure vital signs reviewed and stable Respiratory status: spontaneous breathing, nonlabored ventilation and respiratory function stable Cardiovascular status: blood pressure returned to baseline and stable Postop Assessment: no apparent nausea or vomiting Anesthetic complications: no   No complications documented.   Last Vitals:  Vitals:   01/10/20 1030 01/10/20 1040  BP: 121/84 (!) 122/91  Pulse: (!) 16 (!) 18  Resp:    Temp:    SpO2: 100%     Last Pain:  Vitals:   01/10/20 1040  TempSrc:   PainSc: 0-No pain                 Brett Canales Fouad Taul

## 2020-01-10 NOTE — Transfer of Care (Signed)
Immediate Anesthesia Transfer of Care Note  Patient: Courtney Ross  Procedure(s) Performed: COLONOSCOPY WITH PROPOFOL (N/A )  Patient Location: PACU and Endoscopy Unit  Anesthesia Type:General  Level of Consciousness: awake  Airway & Oxygen Therapy: Patient Spontanous Breathing and Patient connected to nasal cannula oxygen  Post-op Assessment: Report given to RN and Post -op Vital signs reviewed and stable  Post vital signs: Reviewed and stable  Last Vitals:  Vitals Value Taken Time  BP    Temp    Pulse    Resp    SpO2      Last Pain:  Vitals:   01/10/20 0836  TempSrc: Tympanic  PainSc: 0-No pain         Complications: No complications documented.

## 2020-01-11 ENCOUNTER — Encounter: Payer: Self-pay | Admitting: Gastroenterology

## 2020-01-11 LAB — SURGICAL PATHOLOGY

## 2020-01-15 NOTE — Telephone Encounter (Signed)
To discuss at upcoming appt.

## 2020-01-18 ENCOUNTER — Ambulatory Visit: Payer: Medicare Other | Admitting: Family Medicine

## 2020-01-20 NOTE — Progress Notes (Signed)
I,Roshena L Chambers,acting as a scribe for Lelon Huh, MD.,have documented all relevant documentation on the behalf of Lelon Huh, MD,as directed by  Lelon Huh, MD while in the presence of Lelon Huh, MD.   Established patient visit   Patient: Courtney Ross   DOB: [birth year]   64 y.o. Female  MRN: [id number] Visit Date: 01/23/2020  Today's healthcare provider: Lelon Huh, MD   Chief Complaint  Patient presents with  . Depression  . Hyperlipidemia  . Hypertension  . Hypothyroidism  . Pain   Subjective    HPI  Depression, Follow-up  She  was last seen for this 1 year ago. Changes made at last visit include increasing Bupropion to 3 tablet daily.   She reports good compliance with treatment. She is not having side effects.   She reports good tolerance of treatment. She feels she is Improved since last visit.  Depression screen [facility] 2/9 02/17/2019 01/27/2019 07/07/2018  Decreased Interest 0 0 0  Down, Depressed, Hopeless 0 0 0  PHQ - 2 Score 0 0 0  Altered sleeping - - 3  Tired, decreased energy - - 1  Change in appetite - - 0  Feeling bad or failure about yourself  - - 0  Trouble concentrating - - 0  Moving slowly or fidgety/restless - - 0  Suicidal thoughts - - 0  PHQ-9 Score - - 4  Difficult doing work/chores - - Not difficult at all  Some recent data might be hidden    -----------------------------------------------------------------------------------------  Lipid/Cholesterol, Follow-up  Last lipid panel Other pertinent labs  Lab Results  Component Value Date   CHOL 251 (H) 11/08/2019   HDL 81 11/08/2019   LDLCALC 139 (H) 11/08/2019   TRIG 176 (H) 11/08/2019   CHOLHDL 3.1 11/08/2019   Lab Results  Component Value Date   ALT 19 04/05/2019   AST 17 04/05/2019   PLT 252 04/05/2019   TSH 3.500 07/12/2018     She was last seen for this 1 year ago.  Management since that visit includes starting Rosuvastatin 10mg  daily. Patient later  complained of muscle aches and was advised to try reducing the Crestor to every other day, and adding OTC Coenzyme Q10, 200mg  every day.  She reports good compliance with treatment. She is not having side effects.   Symptoms: No chest pain No chest pressure/discomfort  No dyspnea No lower extremity edema  No numbness or tingling of extremity No orthopnea  No palpitations No paroxysmal nocturnal dyspnea  No speech difficulty No syncope   Current diet: well balanced Current exercise: swimming and walking  The 10-year ASCVD risk score Mikey Bussing DC Jr., et al., 2013) is: 3.7%  ---------------------------------------------------------------------------------------------------  Hypertension, follow-up  BP Readings from Last 3 Encounters:  01/23/20 112/70  01/10/20 (!) 122/91  01/04/20 (!) 142/84   Wt Readings from Last 3 Encounters:  01/23/20 184 lb (83.5 kg)  01/10/20 181 lb (82.1 kg)  01/04/20 184 lb (83.5 kg)     She was last seen for hypertension 1 year ago.  BP at that visit was 103/69. Management since that visit includes counseling patient to continue healthy diet.  She reports good compliance with treatment. She is not having side effects.  She is following a Regular diet. She is exercising. She does not smoke.  Use of agents associated with hypertension: NSAIDS.   Outside blood pressures are averaging 308 systolic readings; patient is unsure of the diastolic readings. Symptoms: No chest pain No  chest pressure  No palpitations No syncope  No dyspnea No orthopnea  No paroxysmal nocturnal dyspnea No lower extremity edema   Pertinent labs: Lab Results  Component Value Date   CHOL 251 (H) 11/08/2019   HDL 81 11/08/2019   LDLCALC 139 (H) 11/08/2019   TRIG 176 (H) 11/08/2019   CHOLHDL 3.1 11/08/2019   Lab Results  Component Value Date   NA 141 04/05/2019   K 4.0 04/05/2019   CREATININE 0.94 04/05/2019   GFRNONAA >60 04/05/2019   GFRAA >60 04/05/2019   GLUCOSE  110 (H) 04/05/2019     The 10-year ASCVD risk score Mikey Bussing DC Jr., et al., 2013) is: 3.7%   ---------------------------------------------------------------------------------------------------  Follow up for chronic pain:  The patient was last seen for this 1 year ago. Changes made at last visit include none; UDS obtained.  She reports good compliance with treatment. She feels that condition is Unchanged. She is not having side effects.   -----------------------------------------------------------------------------------------  Hypothyroid, follow-up  Lab Results  Component Value Date   TSH 3.500 07/12/2018   TSH 3.74 11/09/2014   Wt Readings from Last 3 Encounters:  01/23/20 184 lb (83.5 kg)  01/10/20 181 lb (82.1 kg)  01/04/20 184 lb (83.5 kg)    She was last seen for hypothyroid 1 year ago.  Management since that visit includes continue same medication. She reports good compliance with treatment. She is not having side effects.   Symptoms: No change in energy level No constipation  No diarrhea No heat / cold intolerance  No nervousness No palpitations  No weight changes    -----------------------------------------------------------------------------------------  Follow up for restless legs:  The patient was last seen for this 1 year ago. Changes made at last visit include none; stable on ropinarole.  She reports good compliance with treatment. She feels that condition is Improved. She is not having side effects.   ----------------------------------------------------------------------------------------- Continues on estrace for menopausal sx which she has been taking for several years. She is doing well with and not having any issues with hot flashes or mood swings. She states she mainly needs to take it to help vaginal dryness.       Medications: Outpatient Medications Prior to Visit  Medication Sig  . aspirin EC 81 MG tablet Take 81 mg by mouth at  bedtime.  Marland Kitchen buPROPion (WELLBUTRIN SR) 150 MG 12 hr tablet TAKE ONE TABLET 3 TIMES DAILY  . Cholecalciferol (VITAMIN D3) 5000 UNITS CAPS Take 5,000 Units by mouth daily.   . Coenzyme Q10 200 MG capsule Take 200 mg by mouth daily.  . cyclobenzaprine (FLEXERIL) 10 MG tablet Take 1 tablet (10 mg total) by mouth 3 (three) times daily as needed for muscle spasms.  . diazepam (VALIUM) 10 MG tablet TAKE ONE TABLET EVERY 6 HOURS AS NEEDED FOR ANXIETY  . diclofenac Sodium (VOLTAREN) 1 % GEL SMARTSIG:1 Gram(s) Topical 4 Times Daily PRN  . estradiol (ESTRACE) 1 MG tablet TAKE ONE TABLET EVERY DAY  . fluticasone (FLONASE) 50 MCG/ACT nasal spray Place 1 spray into both nostrils daily as needed for allergies or rhinitis.  . hydroquinone 4 % cream Apply 1 application topically daily.  Marland Kitchen LINZESS 145 MCG CAPS capsule TAKE 1 CAPSULE EVERY DAY  . meloxicam (MOBIC) 15 MG tablet Take 15 mg by mouth at bedtime.   . Multiple Vitamin (MULTIVITAMIN WITH MINERALS) TABS tablet Take 1 tablet by mouth daily.  . mupirocin cream (BACTROBAN) 2 % APPLY TO AFFECTED AREAS TOPICALLY TWICE DAILY  .  omeprazole (PRILOSEC) 40 MG capsule Take 1 capsule (40 mg total) by mouth daily. (Patient taking differently: Take 40 mg by mouth daily as needed (heartburn). )  . oxyCODONE (ROXICODONE) 15 MG immediate release tablet Take 1-2 tablets (15-30 mg total) by mouth every 4 (four) hours as needed for pain.  . phentermine 37.5 MG capsule Take 1 capsule (37.5 mg total) by mouth every morning.  . polyethylene glycol (MIRALAX / GLYCOLAX) packet Take 17 g by mouth 2 (two) times daily as needed for mild constipation (dissolve in an 8 oz glass of water).  . pregabalin (LYRICA) 75 MG capsule TAKE 1 CAPSULE BY MOUTH 3 TIMES DAILY (Patient taking differently: Take 150 mg by mouth 2 (two) times daily. )  . RESTASIS 0.05 % ophthalmic emulsion Place 1 drop into both eyes 2 (two) times daily.   Marland Kitchen rOPINIRole (REQUIP) 2 MG tablet TAKE 1 TABLET BY MOUTH AT  BEDTIME  . rosuvastatin (CRESTOR) 10 MG tablet TAKE ONE TABLET EVERY DAY  . Turmeric 500 MG CAPS Take 500 mg by mouth daily.    No facility-administered medications prior to visit.    Review of Systems    Objective    BP 112/70 (BP Location: Left Arm, Patient Position: Sitting, Cuff Size: Large)   Pulse 80   Temp 98.1 F (36.7 C) (Oral)   Resp 16   Wt 184 lb (83.5 kg)   SpO2 98% Comment: room air  BMI 27.98 kg/m    Physical Exam  General appearance:  Overweight female, cooperative and in no acute distress Head: Normocephalic, without obvious abnormality, atraumatic Respiratory: Respirations even and unlabored, normal respiratory rate Extremities: All extremities are intact.  Skin: Skin color, texture, turgor normal. No rashes seen  Psych: Appropriate mood and affect. Neurologic: Mental status: Alert, oriented to person, place, and time, thought content appropriate.   No results found for any visits on 01/23/20.  Assessment & Plan     1. Major depressive disorder with single episode, in partial remission ([place]) Doing very well with current medications and wishes to continue without change.   2. Vitamin D deficiency Recently start OTC vitamin d due to low vitamin D around and is tolerating well.  - VITAMIN D 25 Hydroxy (Vit-D Deficiency, Fractures)  3. Chronic, continuous use of opioids Fairly well controlled on current medication regiment. Continue follow up pain clinic as scheduled.   4. Essential hypertension Well controlled.  Continue current medications.    5. Menopausal symptoms Counseled that topical estrogen may be safer long term treatment than oral ERT. For now will wean to 1/2 tablet daily and consider adding estrogen if she has trouble with vaginal dryness.   Future Appointments  Date Time Provider [place]  03/22/2020  3:00 PM Jonathon Bellows, MD [facility] None  07/09/2020 11:00 AM BFP-NURSE HEALTH ADVISOR [facility]  07/27/2020  1:40 PM Birdie Sons, MD [facility]  11/08/2020  3:00 PM Rubie Maid, MD [facility] None         The entirety of the information documented in the History of Present Illness, Review of Systems and Physical Exam were personally obtained by me. Portions of this information were initially documented by the CMA and reviewed by me for thoroughness and accuracy.      Lelon Huh, MD  [facility] [phone] (phone) [phone] (fax)  [place]

## 2020-01-23 ENCOUNTER — Ambulatory Visit (INDEPENDENT_AMBULATORY_CARE_PROVIDER_SITE_OTHER): Payer: Medicare Other | Admitting: Family Medicine

## 2020-01-23 ENCOUNTER — Encounter: Payer: Self-pay | Admitting: Family Medicine

## 2020-01-23 ENCOUNTER — Other Ambulatory Visit: Payer: Self-pay

## 2020-01-23 VITALS — BP 112/70 | HR 80 | Temp 98.1°F | Resp 16 | Wt 184.0 lb

## 2020-01-23 DIAGNOSIS — F1190 Opioid use, unspecified, uncomplicated: Secondary | ICD-10-CM | POA: Diagnosis not present

## 2020-01-23 DIAGNOSIS — F324 Major depressive disorder, single episode, in partial remission: Secondary | ICD-10-CM

## 2020-01-23 DIAGNOSIS — E559 Vitamin D deficiency, unspecified: Secondary | ICD-10-CM | POA: Diagnosis not present

## 2020-01-23 DIAGNOSIS — I10 Essential (primary) hypertension: Secondary | ICD-10-CM | POA: Diagnosis not present

## 2020-01-23 DIAGNOSIS — N951 Menopausal and female climacteric states: Secondary | ICD-10-CM | POA: Diagnosis not present

## 2020-01-23 MED ORDER — ESTRADIOL 1 MG PO TABS: 0.5000 mg | ORAL_TABLET | Freq: Every day | ORAL | Status: DC

## 2020-01-23 MED ORDER — OXYCODONE HCL 15 MG PO TABS: 15.0000 mg | ORAL_TABLET | ORAL | 0 refills | Status: DC | PRN

## 2020-01-23 NOTE — Patient Instructions (Addendum)
.   Covid-19 vaccines: The Covid vaccines have been given to hundreds of millions of people and found to be very effective and are as safe as any other vaccine.  The [employer] vaccine has been associated with very rare dangerous blood clots, but only in adult women under the age of 72.  The risk of dying from Covid infections is much higher than having a serious reaction to the vaccine.  I strongly recommend getting fully vaccinated against Covid-19.  I recommend that adult women under 60 get fully vaccinated, but the [place] vaccines may be safer for those women than the [employer] vaccine.     Reduce estrogen to 1/2 tablet a day. There is a prescription estrogen cream available if you have any trouble with the lower dose estrogen tablets

## 2020-02-02 DIAGNOSIS — M5441 Lumbago with sciatica, right side: Secondary | ICD-10-CM | POA: Diagnosis not present

## 2020-02-02 DIAGNOSIS — M5442 Lumbago with sciatica, left side: Secondary | ICD-10-CM | POA: Diagnosis not present

## 2020-02-02 DIAGNOSIS — G8929 Other chronic pain: Secondary | ICD-10-CM | POA: Diagnosis not present

## 2020-02-23 ENCOUNTER — Other Ambulatory Visit: Payer: Self-pay | Admitting: Family Medicine

## 2020-02-23 NOTE — Telephone Encounter (Signed)
Requested medication (s) are due for refill today: yes  Requested medication (s) are on the active medication list: yes   Last refill:  01/23/2020  Future visit scheduled:yes   Notes to clinic: this refill cannot be delegated    Requested Prescriptions  Pending Prescriptions Disp Refills   oxyCODONE (ROXICODONE) 15 MG immediate release tablet 180 tablet 0    Sig: Take 1-2 tablets (15-30 mg total) by mouth every 4 (four) hours as needed for pain.      Not Delegated - Analgesics:  Opioid Agonists Failed - 02/23/2020  2:55 PM      Failed - This refill cannot be delegated      Failed - Urine Drug Screen completed in last 360 days.      Passed - Valid encounter within last 6 months    Recent Outpatient Visits           1 month ago Vitamin D deficiency   [facility] Fisher, Kirstie Peri, MD   1 year ago Annual physical exam   [facility] Birdie Sons, MD   2 years ago Elevated blood pressure reading   [facility] Birdie Sons, MD   3 years ago Essential hypertension   [facility] Birdie Sons, MD   3 years ago Cough   [facility] Birdie Sons, MD       Future Appointments             In 4 weeks Jonathon Bellows, MD [place]   In 5 months Fisher, Kirstie Peri, MD [facility] [place]   In 8 months Rubie Maid, MD [facility]

## 2020-02-23 NOTE — Telephone Encounter (Signed)
Medication: oxyCODONE (ROXICODONE) 15 MG immediate release tablet [[id number]]   Has the patient contacted their pharmacy? YES (Agent: If no, request that the patient contact the pharmacy for the refill.) (Agent: If yes, when and what did the pharmacy advise?)  Preferred Pharmacy (with phone number or street name): [place], [place] [address] [place] [address] Phone: [phone] Fax: [phone] Hours: Not open 24 hours    Agent: Please be advised that RX refills may take up to 3 business days. We ask that you follow-up with your pharmacy.

## 2020-02-25 ENCOUNTER — Other Ambulatory Visit: Payer: Self-pay | Admitting: Family Medicine

## 2020-02-28 ENCOUNTER — Telehealth: Payer: Self-pay

## 2020-02-28 NOTE — Telephone Encounter (Signed)
Copied from [place] [id number]. Topic: General - Other >> Feb 28, 2020 12:47 PM Rainey Pines A wrote: Patient is requesting a callback from Dr. Sabino Snipes nurse I nregards to her medication refill request from 9/2

## 2020-02-29 MED ORDER — OXYCODONE HCL 15 MG PO TABS: 15.0000 mg | ORAL_TABLET | ORAL | 0 refills | Status: DC | PRN

## 2020-02-29 NOTE — Telephone Encounter (Signed)
Duplicate refill request.

## 2020-03-01 ENCOUNTER — Other Ambulatory Visit: Payer: Self-pay | Admitting: Family Medicine

## 2020-03-01 NOTE — Telephone Encounter (Signed)
   Notes to clinic:  looks like this was refill yesterday  NT is unable to refuse    Requested Prescriptions  Pending Prescriptions Disp Refills   oxyCODONE (ROXICODONE) 15 MG immediate release tablet [Pharmacy Med Name: OXYCODONE HCL 15 MG TAB] 180 tablet     Sig: TAKE 1-2 TABLETS BY MOUTH EVERY 4 HOURS FOR PAIN.      Not Delegated - Analgesics:  Opioid Agonists Failed - 03/01/2020  2:29 PM      Failed - This refill cannot be delegated      Failed - Urine Drug Screen completed in last 360 days.      Passed - Valid encounter within last 6 months    Recent Outpatient Visits           1 month ago Vitamin D deficiency   [facility] Fisher, Kirstie Peri, MD   1 year ago Annual physical exam   [facility] Birdie Sons, MD   2 years ago Elevated blood pressure reading   [facility] Birdie Sons, MD   3 years ago Essential hypertension   [facility] Birdie Sons, MD   3 years ago Cough   [facility] Birdie Sons, MD       Future Appointments             In 3 weeks Jonathon Bellows, MD [place]   In 4 months Caryn Section, Kirstie Peri, MD [facility], [place]   In 8 months Rubie Maid, MD [facility]

## 2020-03-11 ENCOUNTER — Other Ambulatory Visit: Payer: Self-pay | Admitting: Family Medicine

## 2020-03-11 NOTE — Telephone Encounter (Signed)
Requested Prescriptions  Pending Prescriptions Disp Refills   rOPINIRole (REQUIP) 2 MG tablet [Pharmacy Med Name: ROPINIROLE HCL 2 MG TAB] 90 tablet 1    Sig: TAKE 1 TABLET BY MOUTH AT BEDTIME     Neurology:  Parkinsonian Agents Passed - 03/11/2020  6:41 PM      Passed - Last BP in normal range    BP Readings from Last 1 Encounters:  01/23/20 112/70         Passed - Valid encounter within last 12 months    Recent Outpatient Visits          1 month ago Vitamin D deficiency   [facility] Birdie Sons, MD   1 year ago Annual physical exam   [facility] Birdie Sons, MD   2 years ago Elevated blood pressure reading   [facility] Birdie Sons, MD   3 years ago Essential hypertension   [facility] Birdie Sons, MD   3 years ago Cough   [facility] Birdie Sons, MD      Future Appointments            In 1 week Jonathon Bellows, MD [place]   In 4 months Fisher, Kirstie Peri, MD [facility], [place]   In 8 months Rubie Maid, MD [facility]

## 2020-03-22 ENCOUNTER — Ambulatory Visit (INDEPENDENT_AMBULATORY_CARE_PROVIDER_SITE_OTHER): Payer: Medicare Other | Admitting: Gastroenterology

## 2020-03-22 VITALS — BP 138/84 | HR 93 | Temp 98.4°F | Ht 68.0 in | Wt 190.0 lb

## 2020-03-22 DIAGNOSIS — K641 Second degree hemorrhoids: Secondary | ICD-10-CM

## 2020-03-22 NOTE — Progress Notes (Signed)
Jonathon Bellows MD, MRCP(U.K) [address]  [place]  [place], [place] [address]  Main: [phone]  Fax: [phone]   Primary Care Physician: Birdie Sons, MD  Primary Gastroenterologist:  Dr. Jonathon Bellows   Follow-up for constipation and grade 3 hemorrhoids  HPI: Courtney Ross is a 64 y.o. female    Summary of history :  Initially referred and seen on 01/04/2020 for hemorrhoids.  Had been on Linzess 145 mcg for constipation at times has not had a bowel movement for up to 4 days.  Associated with occasional bleeding.  History of colon polyps in the past.  Last colonoscopy in July 2014 and was suggested to have a repeat in 5 years.   Interval history   01/04/2020-03/22/2020  01/10/2020: Colonoscopy: Large internal hemorrhoids grade 2 were found.  3 polyps 3 to 5 mm in size were resected in the ascending colon and cecum.  They were tubular adenomas.  She continues to have symptomatic hemorrhoids despite conservative management.  Occasional bleeding and perianal discomfort.     Current Outpatient Medications  Medication Sig Dispense Refill  . aspirin EC 81 MG tablet Take 81 mg by mouth at bedtime.    Marland Kitchen buPROPion (WELLBUTRIN SR) 150 MG 12 hr tablet TAKE ONE TABLET 3 TIMES DAILY 90 tablet 3  . Cholecalciferol (VITAMIN D3) 5000 UNITS CAPS Take 5,000 Units by mouth daily.     . Coenzyme Q10 200 MG capsule Take 200 mg by mouth daily.    . cyclobenzaprine (FLEXERIL) 10 MG tablet Take 1 tablet (10 mg total) by mouth 3 (three) times daily as needed for muscle spasms. 90 tablet 5  . diazepam (VALIUM) 10 MG tablet TAKE ONE TABLET EVERY 6 HOURS AS NEEDED FOR ANXIETY 120 tablet 5  . diclofenac Sodium (VOLTAREN) 1 % GEL SMARTSIG:1 Gram(s) Topical 4 Times Daily PRN    . estradiol (ESTRACE) 1 MG tablet Take 0.5 tablets (0.5 mg total) by mouth daily. 3 tablet   . fluticasone (FLONASE) 50 MCG/ACT nasal spray Place 1 spray into both nostrils daily as needed for allergies or rhinitis.      . hydroquinone 4 % cream Apply 1 application topically daily. 28.35 g 1  . LINZESS 145 MCG CAPS capsule TAKE 1 CAPSULE EVERY DAY 30 capsule 12  . meloxicam (MOBIC) 15 MG tablet Take 15 mg by mouth at bedtime.     . Multiple Vitamin (MULTIVITAMIN WITH MINERALS) TABS tablet Take 1 tablet by mouth daily.    . mupirocin cream (BACTROBAN) 2 % APPLY TO AFFECTED AREAS TOPICALLY TWICE DAILY 15 g 2  . omeprazole (PRILOSEC) 40 MG capsule Take 1 capsule (40 mg total) by mouth daily. (Patient taking differently: Take 40 mg by mouth daily as needed (heartburn). ) 30 capsule 1  . oxyCODONE (ROXICODONE) 15 MG immediate release tablet TAKE 1-2 TABLETS BY MOUTH EVERY 4 HOURS FOR PAIN. 180 tablet 0  . phentermine 37.5 MG capsule Take 1 capsule (37.5 mg total) by mouth every morning. 30 capsule 0  . polyethylene glycol (MIRALAX / GLYCOLAX) packet Take 17 g by mouth 2 (two) times daily as needed for mild constipation (dissolve in an 8 oz glass of water). 14 each 1  . pregabalin (LYRICA) 75 MG capsule TAKE 1 CAPSULE BY MOUTH 3 TIMES DAILY (Patient taking differently: Take 150 mg by mouth 2 (two) times daily. ) 90 capsule 1  . RESTASIS 0.05 % ophthalmic emulsion Place 1 drop into both eyes 2 (two) times daily.     Marland Kitchen  rOPINIRole (REQUIP) 2 MG tablet TAKE 1 TABLET BY MOUTH AT BEDTIME 90 tablet 1  . rosuvastatin (CRESTOR) 10 MG tablet TAKE ONE TABLET EVERY DAY 90 tablet 4  . Turmeric 500 MG CAPS Take 500 mg by mouth daily.      No current facility-administered medications for this visit.    Allergies as of 03/22/2020 - Review Complete 01/23/2020  Allergen Reaction Noted  . Hydrocodone Itching 12/05/2014  . Lisinopril Cough 10/29/2016  . Mirtazapine  05/29/2015  . Other Nausea And Vomiting 04/28/2018    ROS:  General: Negative for anorexia, weight loss, fever, chills, fatigue, weakness. ENT: Negative for hoarseness, difficulty swallowing , nasal congestion. CV: Negative for chest pain, angina, palpitations,  dyspnea on exertion, peripheral edema.  Respiratory: Negative for dyspnea at rest, dyspnea on exertion, cough, sputum, wheezing.  GI: See history of present illness. GU:  Negative for dysuria, hematuria, urinary incontinence, urinary frequency, nocturnal urination.  Endo: Negative for unusual weight change.    Physical Examination:   BP 138/84   Pulse 93   Temp 98.4 F (36.9 C)   Ht 5\' 8"  (1.727 m)   Wt 190 lb (86.2 kg)   BMI 28.89 kg/m   General: Well-nourished, well-developed in no acute distress.  Eyes: No icterus. Conjunctivae pink. Neuro: Alert and oriented x 3.  Grossly intact. Skin: Warm and dry, no jaundice.   Psych: Alert and cooperative, normal mood and affect.  PROCEDURE NOTE: The patient presents with symptomatic grade 2 hemorrhoids, unresponsive to maximal medical therapy, requesting rubber band ligation of his/her hemorrhoidal disease.  All risks, benefits and alternative forms of therapy were described and informed consent was obtained.  In the Left Lateral Decubitus position (if anoscopy is performed) anoscopic examination revealed grade 2 hemorrhoids in the all position(s).   The decision was made to band the RA internal hemorrhoid, and the [place] was used to perform band ligation without complication.  Digital anorectal examination was then performed to assure proper positioning of the band, and to adjust the banded tissue as required.  The patient was discharged home without pain or other issues.  Dietary and behavioral recommendations were given and (if necessary - prescriptions were given), along with follow-up instructions.  The patient will return 4 weeks for follow-up and possible additional banding as required.  No complications were encountered and the patient tolerated the procedure well.   Imaging Studies: No results found.  Assessment and Plan:   Courtney Ross is a 64 y.o. y/o female here to follow-up for symptomatic internal  hemorrhoids as well as constipation.  Constipation doing well but continues to have symptomatic hemorrhoids.  Right anterior column was banded today.  She will return back in 4 weeks for further banding.      Dr Jonathon Bellows  MD,MRCP [facility]) Follow up in 4 weeks

## 2020-03-29 ENCOUNTER — Other Ambulatory Visit: Payer: Self-pay | Admitting: Family Medicine

## 2020-03-29 NOTE — Telephone Encounter (Signed)
Requested medication (s) are due for refill today: yes  Requested medication (s) are on the active medication list: yes  Last refill: 03/01/20  #180  0 refills  Future visit scheduled  yes 07/27/20  Notes to clinic: not delegated  Requested Prescriptions  Pending Prescriptions Disp Refills   oxyCODONE (ROXICODONE) 15 MG immediate release tablet 180 tablet 0    Sig: TAKE 1-2 TABLETS BY MOUTH EVERY 4 HOURS FOR PAIN.      Not Delegated - Analgesics:  Opioid Agonists Failed - 03/29/2020  1:17 PM      Failed - This refill cannot be delegated      Failed - Urine Drug Screen completed in last 360 days.      Passed - Valid encounter within last 6 months    Recent Outpatient Visits           2 months ago Vitamin D deficiency   [facility] Birdie Sons, MD   1 year ago Annual physical exam   [facility] Birdie Sons, MD   3 years ago Elevated blood pressure reading   [facility] Birdie Sons, MD   3 years ago Essential hypertension   [facility] Birdie Sons, MD   3 years ago Cough   [facility] Birdie Sons, MD       Future Appointments             In 1 week Jonathon Bellows, MD [place]   In 4 months Caryn Section, Kirstie Peri, MD [facility], [place]   In 7 months Rubie Maid, MD [facility]

## 2020-03-29 NOTE — Telephone Encounter (Signed)
Medication Refill - Medication: oxyCODONE (ROXICODONE) 15 MG immediate release tablet   Has the patient contacted their pharmacy? yes (Agent: If no, request that the patient contact the pharmacy for the refill.) (Agent: If yes, when and what did the pharmacy advise?)contact pcp  Preferred Pharmacy (with phone number or street name):  [place], [place] - [place] Phone:  [phone]  Fax:  [phone]       Agent: Please be advised that RX refills may take up to 3 business days. We ask that you follow-up with your pharmacy.

## 2020-03-30 MED ORDER — OXYCODONE HCL 15 MG PO TABS: ORAL_TABLET | ORAL | 0 refills | Status: DC

## 2020-04-10 ENCOUNTER — Other Ambulatory Visit: Payer: Self-pay | Admitting: Family Medicine

## 2020-04-10 DIAGNOSIS — F324 Major depressive disorder, single episode, in partial remission: Secondary | ICD-10-CM

## 2020-04-11 ENCOUNTER — Encounter: Payer: Self-pay | Admitting: Gastroenterology

## 2020-04-11 ENCOUNTER — Other Ambulatory Visit: Payer: Self-pay

## 2020-04-11 ENCOUNTER — Ambulatory Visit (INDEPENDENT_AMBULATORY_CARE_PROVIDER_SITE_OTHER): Payer: Medicare Other | Admitting: Gastroenterology

## 2020-04-11 VITALS — BP 131/86 | HR 98 | Temp 98.2°F | Ht 68.0 in | Wt 183.0 lb

## 2020-04-11 DIAGNOSIS — K642 Third degree hemorrhoids: Secondary | ICD-10-CM | POA: Diagnosis not present

## 2020-04-11 DIAGNOSIS — K641 Second degree hemorrhoids: Secondary | ICD-10-CM | POA: Diagnosis not present

## 2020-04-11 NOTE — Progress Notes (Signed)
Patient follow-ups today for banding of hemorrhoids    Summary of history :  She has symptomatic grade 2 hemorrhoids unresponsive to maximal medical therapy. Underwent ligation on 03/22/2020 first round. Right anterior internal hemorrhoid was banded.   First round: 03/22/2020 right anterior hemorrhoid banded   Interval history   03/22/2020-04/11/2020 Did well after the initial banding no complaints feels much better   Digital rectal exam performed in the presence of a chaperone. External anal findings: Normal Internal findings: , No masses, no blood on glove noticed.    PROCEDURE NOTE: The patient presents with symptomatic grade 2 hemorrhoids, unresponsive to maximal medical therapy, requesting rubber band ligation of his/her hemorrhoidal disease.  All risks, benefits and alternative forms of therapy were described and informed consent was obtained.  In the Left Lateral Decubitus position (if anoscopy is performed) anoscopic examination revealed grade 2 hemorrhoids in the RP and LL position(s).   The decision was made to band the LL internal hemorrhoid, and the [place] was used to perform band ligation without complication.  Digital anorectal examination was then performed to assure proper positioning of the band, and to adjust the banded tissue as required.  The patient was discharged home without pain or other issues.  Dietary and behavioral recommendations were given and (if necessary - prescriptions were given), along with follow-up instructions.  The patient will return 4 weeks for follow-up and possible additional banding as required.  No complications were encountered and the patient tolerated the procedure well.   Plan:  1. Avoid constipation.  Commence on stool softeners if not already on  Follow-up: 4 weeks  Dr Jonathon Bellows MD,MRCP [facility]) Gastroenterology/Hepatology Pager: [phone]

## 2020-04-27 ENCOUNTER — Other Ambulatory Visit: Payer: Self-pay | Admitting: Family Medicine

## 2020-04-27 MED ORDER — OXYCODONE HCL 15 MG PO TABS: ORAL_TABLET | ORAL | 0 refills | Status: DC

## 2020-04-27 NOTE — Telephone Encounter (Signed)
Medication Refill - Medication: oxyCODONE (ROXICODONE) 15 MG immediate release tablet    Has the patient contacted their pharmacy?yes (Agent: If no, request that the patient contact the pharmacy for the refill.) (Agent: If yes, when and what did the pharmacy advise?)Contact PCP  Preferred Pharmacy (with phone number or street name):  [place], [place] - [place] Phone:  [phone]  Fax:  [phone]       Agent: Please be advised that RX refills may take up to 3 business days. We ask that you follow-up with your pharmacy.

## 2020-04-27 NOTE — Telephone Encounter (Signed)
Requested medication (s) are due for refill today: yes  Requested medication (s) are on the active medication list: yes  Last refill:  03/30/20  Future visit scheduled: yes  Notes to clinic:  not delegated    Requested Prescriptions  Pending Prescriptions Disp Refills   oxyCODONE (ROXICODONE) 15 MG immediate release tablet 180 tablet 0    Sig: TAKE 1-2 TABLETS BY MOUTH EVERY 4 HOURS FOR PAIN.      Not Delegated - Analgesics:  Opioid Agonists Failed - 04/27/2020  2:33 PM      Failed - This refill cannot be delegated      Failed - Urine Drug Screen completed in last 360 days      Passed - Valid encounter within last 6 months    Recent Outpatient Visits           3 months ago Vitamin D deficiency   [facility] Birdie Sons, MD   1 year ago Annual physical exam   [facility] Birdie Sons, MD   3 years ago Elevated blood pressure reading   [facility] Birdie Sons, MD   3 years ago Essential hypertension   [facility] Birdie Sons, MD   3 years ago Cough   [facility] Birdie Sons, MD       Future Appointments             In 1 week Jonathon Bellows, MD [place]   In 3 months Caryn Section, Kirstie Peri, MD [facility], [place]   In 6 months Rubie Maid, MD [facility]

## 2020-04-30 ENCOUNTER — Telehealth: Payer: Self-pay | Admitting: Family Medicine

## 2020-04-30 MED ORDER — OXYCODONE HCL 15 MG PO TABS: ORAL_TABLET | ORAL | 0 refills | Status: DC

## 2020-04-30 NOTE — Telephone Encounter (Signed)
Pt is calling and only receive 30 pills of oxycodone on 04-27-2020. Pt gets 180 per twenty something days. [facility] [place]

## 2020-04-30 NOTE — Addendum Note (Signed)
Addended by: Birdie Sons on: 04/30/2020 10:52 AM   Modules accepted: Orders

## 2020-04-30 NOTE — Telephone Encounter (Signed)
Routing to [facility]. 

## 2020-05-08 ENCOUNTER — Ambulatory Visit (INDEPENDENT_AMBULATORY_CARE_PROVIDER_SITE_OTHER): Payer: Medicare Other | Admitting: Gastroenterology

## 2020-05-08 ENCOUNTER — Other Ambulatory Visit: Payer: Self-pay

## 2020-05-08 VITALS — BP 161/100 | HR 90 | Temp 98.2°F | Ht 68.0 in | Wt 182.0 lb

## 2020-05-08 DIAGNOSIS — K641 Second degree hemorrhoids: Secondary | ICD-10-CM

## 2020-05-08 NOTE — Progress Notes (Signed)
Patient follow-ups today for banding of hemorrhoids    Summary of history :  She has symptomatic grade 2 hemorrhoids unresponsive to maximal medical therapy    First round:03/22/2020 right anterior hemorrhoid banded Second round:04/11/2020  LL hemorrhoid banded    Interval history  04/11/2020- 05/08/2020    Digital rectal exam performed in the presence of a chaperone. External anal findings: External nonthrombosed hemorrhoids Internal findings: , No masses, no blood on glove noticed.    PROCEDURE NOTE: The patient presents with symptomatic grade 2 hemorrhoids, unresponsive to maximal medical therapy, requesting rubber band ligation of his/her hemorrhoidal disease.  All risks, benefits and alternative forms of therapy were described and informed consent was obtained.  In the Left Lateral Decubitus position (if anoscopy is performed) anoscopic examination revealed grade 2 hemorrhoids in the RP position(s).   The decision was made to band the RP internal hemorrhoid, and the [place] was used to perform band ligation without complication.  Digital anorectal examination was then performed to assure proper positioning of the band, and to adjust the banded tissue as required.  The patient was discharged home without pain or other issues.  Dietary and behavioral recommendations were given and (if necessary - prescriptions were given), along with follow-up instructions.  The patient will return As needed as needed for follow-up and possible additional banding as required.  No complications were encountered and the patient tolerated the procedure well.   Plan:  1. Avoid constipation.  Commence on stool softeners if not already on  Follow-up:as needed   Dr Jonathon Bellows MD,MRCP [facility]) Gastroenterology/Hepatology Pager: [phone]

## 2020-05-09 ENCOUNTER — Other Ambulatory Visit: Payer: Self-pay | Admitting: Family Medicine

## 2020-06-01 ENCOUNTER — Other Ambulatory Visit: Payer: Self-pay | Admitting: Family Medicine

## 2020-06-01 MED ORDER — OXYCODONE HCL 15 MG PO TABS: ORAL_TABLET | ORAL | 0 refills | Status: DC

## 2020-06-01 NOTE — Telephone Encounter (Signed)
Please review for refill. Refill not delegated per protocol.  Last refill: 04/30/20 #180

## 2020-06-01 NOTE — Telephone Encounter (Signed)
Medication Refill - Medication: oxyCODONE (ROXICODONE) 15 MG immediate release tablet    Has the patient contacted their pharmacy? yes (Agent: If no, request that the patient contact the pharmacy for the refill.) (Agent: If yes, when and what did the pharmacy advise?)Contact PCP  Preferred Pharmacy (with phone number or street name):  [place], [place] - [place] Phone:  [phone]  Fax:  [phone]       Agent: Please be advised that RX refills may take up to 3 business days. We ask that you follow-up with your pharmacy.

## 2020-07-02 ENCOUNTER — Telehealth: Payer: Self-pay | Admitting: Family Medicine

## 2020-07-02 NOTE — Telephone Encounter (Signed)
Copied from [place] [phone] [id number]. Topic: Quick Communication - Rx Refill/Question >> Jul 02, 2020 10:19 AM Tessa Lerner A wrote: Medication: oxyCODONE (ROXICODONE) 15 MG   Has the patient contacted their pharmacy? No, preferred to contact PCP first.   Preferred Pharmacy (with phone number or street name): [place], [place] - [place] Phone: [phone]  Agent: Please be advised that RX refills may take up to 3 business days. We ask that you follow-up with your pharmacy.

## 2020-07-02 NOTE — Telephone Encounter (Signed)
Requested medication (s) are due for refill today: yes  Requested medication (s) are on the active medication list: yes  Last refill:  06/01/2020  Future visit scheduled: yes  Notes to clinic:  this refill cannot be delegated    Requested Prescriptions  Pending Prescriptions Disp Refills   oxyCODONE (ROXICODONE) 15 MG immediate release tablet 180 tablet 0    Sig: TAKE 1-2 TABLETS BY MOUTH EVERY 4 HOURS FOR PAIN.      There is no refill protocol information for this order

## 2020-07-03 MED ORDER — OXYCODONE HCL 15 MG PO TABS: ORAL_TABLET | ORAL | 0 refills | Status: DC

## 2020-07-05 NOTE — Progress Notes (Signed)
Subjective:   Courtney Ross is a 65 y.o. female who presents for Medicare Annual (Subsequent) preventive examination.  I connected with Shanina Kepple today by telephone and verified that I am speaking with the correct person using two identifiers. Location patient: home Location provider: work Persons participating in the virtual visit: patient, provider.   I discussed the limitations, risks, security and privacy concerns of performing an evaluation and management service by telephone and the availability of in person appointments. I also discussed with the patient that there may be a patient responsible charge related to this service. The patient expressed understanding and verbally consented to this telephonic visit.    Interactive audio and video telecommunications were attempted between this provider and patient, however failed, due to patient having technical difficulties OR patient did not have access to video capability.  We continued and completed visit with audio only.   Review of Systems    N/A  Cardiac Risk Factors include: advanced age (>57men, >66 women);dyslipidemia;hypertension     Objective:    There were no vitals filed for this visit. There is no height or weight on file to calculate BMI.  Advanced Directives 07/09/2020 01/10/2020 07/05/2019 04/05/2019 02/17/2019 01/27/2019 09/01/2018  Does Patient Have a Medical Advance Directive? Yes Yes Yes Yes Yes Yes Yes  Type of Paramedic of [place];Living will - [place];Living will [place];Living will [place];Living will - [place];Living will  Does patient want to make changes to medical advance directive? - - - - No - Patient declined - -  Copy of [place] in Chart? Yes - validated most recent copy scanned in chart (See row information) - Yes - validated most recent copy scanned in chart (See row  information) No - copy requested Yes - validated most recent copy scanned in chart (See row information) - No - copy requested  Would patient like information on creating a medical advance directive? - - - - - - -    Current Medications (verified) Outpatient Encounter Medications as of 07/09/2020  Medication Sig  . aspirin EC 81 MG tablet Take 81 mg by mouth at bedtime.  Marland Kitchen buPROPion (WELLBUTRIN SR) 150 MG 12 hr tablet TAKE ONE TABLET 3 TIMES DAILY  . Cholecalciferol (VITAMIN D3) 5000 UNITS CAPS Take 5,000 Units by mouth daily.   . Coenzyme Q10 200 MG capsule Take 200 mg by mouth daily.  . cyclobenzaprine (FLEXERIL) 10 MG tablet Take 1 tablet (10 mg total) by mouth 3 (three) times daily as needed for muscle spasms.  . diazepam (VALIUM) 10 MG tablet TAKE ONE TABLET EVERY 6 HOURS AS NEEDED FOR ANXIETY  . diclofenac Sodium (VOLTAREN) 1 % GEL SMARTSIG:1 Gram(s) Topical 4 Times Daily PRN  . fluticasone (FLONASE) 50 MCG/ACT nasal spray Place 1 spray into both nostrils daily as needed for allergies or rhinitis.  . hydroquinone 4 % cream Apply 1 application topically daily.  Marland Kitchen LINZESS 145 MCG CAPS capsule TAKE 1 CAPSULE EVERY DAY  . meloxicam (MOBIC) 15 MG tablet Take 15 mg by mouth at bedtime.   . Multiple Vitamin (MULTIVITAMIN WITH MINERALS) TABS tablet Take 1 tablet by mouth daily.  . mupirocin cream (BACTROBAN) 2 % APPLY TO AFFECTED AREAS TOPICALLY TWICE DAILY  . MYRBETRIQ 25 MG TB24 tablet TAKE 1 TABLET BY MOUTH DAILY  . oxyCODONE (ROXICODONE) 15 MG immediate release tablet TAKE 1-2 TABLETS BY MOUTH EVERY 4 HOURS FOR PAIN.  Marland Kitchen  polyethylene glycol (MIRALAX / GLYCOLAX) packet Take 17 g by mouth 2 (two) times daily as needed for mild constipation (dissolve in an 8 oz glass of water).  . pregabalin (LYRICA) 75 MG capsule TAKE 1 CAPSULE BY MOUTH 3 TIMES DAILY (Patient taking differently: Take 150 mg by mouth 2 (two) times daily.)  . rOPINIRole (REQUIP) 2 MG tablet TAKE 1 TABLET BY MOUTH AT BEDTIME  .  rosuvastatin (CRESTOR) 10 MG tablet TAKE ONE TABLET EVERY DAY  . Turmeric 500 MG CAPS Take 500 mg by mouth daily.   Marland Kitchen estradiol (ESTRACE) 1 MG tablet TAKE ONE TABLET EVERY DAY (Patient not taking: Reported on 07/09/2020)  . omeprazole (PRILOSEC) 40 MG capsule Take 1 capsule (40 mg total) by mouth daily. (Patient taking differently: Take 40 mg by mouth daily as needed (heartburn).)  . phentermine 37.5 MG capsule Take 1 capsule (37.5 mg total) by mouth every morning.  . RESTASIS 0.05 % ophthalmic emulsion Place 1 drop into both eyes 2 (two) times daily.   . [DISCONTINUED] estradiol (ESTRACE) 1 MG tablet TAKE ONE TABLET EVERY DAY   No facility-administered encounter medications on file as of 07/09/2020.    Allergies (verified) Hydrocodone, Lisinopril, Mirtazapine, and Other   History: Past Medical History:  Diagnosis Date  . Anxiety   . Back pain   . Colon polyp   . Constipation   . Degenerative disc disease, lumbar   . Depression   . Family history of adverse reaction to anesthesia    daughter nausea  . Fatty liver   . GERD (gastroesophageal reflux disease)   . H/O spinal fusion   . Headache    migraines  . Headache, migraine 05/08/2015  . High cholesterol   . Hypertension   . Lower back pain   . Polycystic ovarian disease   . PONV (postoperative nausea and vomiting)    due to surgery from the 1990's  . Restless leg syndrome   . Spasm 01/29/2009   Past Surgical History:  Procedure Laterality Date  . [place]  . APPENDECTOMY  1988  . ARTHRODESIS METATARSALPHALANGEAL JOINT (MTPJ) Left 12/14/2014   Procedure: ARTHRODESIS METATARSALPHALANGEAL JOINT (MTPJ) 1ST;  Surgeon: Albertine Patricia, DPM;  Location: [facility];  Service: Podiatry;  Laterality: Left;  LMA  . back fusion  2004   lumbar region. had broken facet joint.   Marland Kitchen BACK SURGERY  [phone]   x3  . BLADDER SUSPENSION  2006  . BREAST BIOPSY Left 1990's   core [place]  . BREAST SURGERY   2005   lumpectomy  . CARPAL TUNNEL RELEASE Bilateral 02/26/07,05/21/07  . CHOLECYSTECTOMY N/A 05/03/2018   Procedure: LAPAROSCOPIC CHOLECYSTECTOMY WITH INTRAOPERATIVE CHOLANGIOGRAM;  Surgeon: Robert Bellow, MD;  Location: [facility];  Service: General;  Laterality: N/A;  . COLONOSCOPY    . COLONOSCOPY WITH PROPOFOL N/A 01/10/2020   Procedure: COLONOSCOPY WITH PROPOFOL;  Surgeon: Jonathon Bellows, MD;  Location: [facility];  Service: Gastroenterology;  Laterality: N/A;  . COLPORRHAPHY    . DILATION AND CURETTAGE OF UTERUS    . [place]  . FOOT SURGERY Left 2017   fusion on big toe, hammer toe, bunions  . HAMMER TOE SURGERY Left 03/22/2015   Procedure: HAMMER TOE CORRECTION L 2ND AND 3RD ;  Surgeon: Albertine Patricia, DPM;  Location: [facility];  Service: Podiatry;  Laterality: Left;  WITH LOCAL  . HERNIA REPAIR Right 1988   inguinal  . LAMINECTOMY  1993, 1994  . NECK SURGERY  2006   fusion  . RECTOCELE REPAIR N/A 08/25/2016   Procedure: POSTERIOR REPAIR (RECTOCELE);  Surgeon: Herold Harms, MD;  Location: [facility];  Service: Gynecology;  Laterality: N/A;  . SPINAL CORD STIMULATOR BATTERY EXCHANGE Right 04/08/2019   Procedure: SPINAL CORD STIMULATOR BATTERY EXCHANGE;  Surgeon: Odette Fraction, MD;  Location: [facility];  Service: Neurosurgery;  Laterality: Right;  SPINAL CORD STIMULATOR BATTERY EXCHANGE  . SPINAL CORD STIMULATOR IMPLANT  2014   stimulator in the ileo inguinal nerve due to scar tissue in abdomen  . TONSILLECTOMY  1985  . VENTRAL HERNIA REPAIR N/A 09/01/2018   6.4 cm Ventra lite mesh. HERNIA REPAIR VENTRAL ADULT;  Surgeon: Earline Mayotte, MD;  Location: [facility];  Service: General;  Laterality: N/A;   Family History  Problem Relation Age of Onset  . Diabetes Father   . Hypertension Father   . CAD Father   . Diabetes Sister        non-Insulin dependent diabetes mellitus  . Heart disease Brother   . Cancer Daughter        cancer of the  cervix at age 41-18  . Heart disease Brother   . Breast cancer Paternal Aunt   . Colon cancer Paternal Uncle   . Ovarian cancer Neg Hx    Social History   Socioeconomic History  . Marital status: Legally Separated    Spouse name: Not on file  . Number of children: 4  . Years of education: Not on file  . Highest education level: 9th grade  Occupational History    Comment: disabled  Tobacco Use  . Smoking status: Never Smoker  . Smokeless tobacco: Never Used  Vaping Use  . Vaping Use: Never used  Substance and Sexual Activity  . Alcohol use: No    Alcohol/week: 0.0 standard drinks  . Drug use: No  . Sexual activity: Yes    Birth control/protection: Surgical  Other Topics Concern  . Not on file  Social History Narrative   Pt is currently receiving assistance from [employer] and gets food stamps.    Social Determinants of Health   Financial Resource Strain: Low Risk   . Difficulty of Paying Living Expenses: Not hard at all  Food Insecurity: No Food Insecurity  . Worried About Programme researcher, broadcasting/film/video in the Last Year: Never true  . Ran Out of Food in the Last Year: Never true  Transportation Needs: No Transportation Needs  . Lack of Transportation (Medical): No  . Lack of Transportation (Non-Medical): No  Physical Activity: Insufficiently Active  . Days of Exercise per Week: 3 days  . Minutes of Exercise per Session: 30 min  Stress: No Stress Concern Present  . Feeling of Stress : Not at all  Social Connections: Unknown  . Frequency of Communication with Friends and Family: More than three times a week  . Frequency of Social Gatherings with Friends and Family: More than three times a week  . Attends Religious Services: More than 4 times per year  . Active Member of Clubs or Organizations: Yes  . Attends Banker Meetings: More than 4 times per year  . Marital Status: Not on file    Tobacco Counseling Counseling given: Not Answered   Clinical  Intake:  Pre-visit preparation completed: Yes  Pain : No/denies pain (Has chronic back pain.)     Nutritional Risks: None Diabetes: No  How often do you need to have  someone help you when you read instructions, pamphlets, or other written materials from your doctor or pharmacy?: 1 - Never  Diabetic? No  Interpreter Needed?: No  Information entered by :: [facility], LPN   Activities of Daily Living In your present state of health, do you have any difficulty performing the following activities: 07/09/2020  Hearing? N  Vision? Y  Comment Due to cataracts on both eyes.  Difficulty concentrating or making decisions? N  Walking or climbing stairs? Y  Comment Due to back pain.  Dressing or bathing? N  Doing errands, shopping? N  Preparing Food and eating ? N  Using the Toilet? N  In the past six months, have you accidently leaked urine? Y  Comment Currently on Myrbetriq for leakage issues.  Do you have problems with loss of bowel control? N  Managing your Medications? N  Managing your Finances? N  Housekeeping or managing your Housekeeping? N  Some recent data might be hidden    Patient Care Team: Malva Limes, MD as PCP - General (Family Medicine) Lockie Mola, MD as Referring Physician (Ophthalmology) Arlis Porta, MD as Referring Physician (Neurosurgery) Lemar Livings, Merrily Pew, MD (General Surgery) Wyline Mood, MD as Consulting Physician (Gastroenterology) Rosetta Posner, DPM as Consulting Physician (Podiatry) Hildred Laser, MD as Referring Physician (Obstetrics and Gynecology)  Indicate any recent Medical Services you may have received from other than [facility] providers in the past year (date may be approximate).     Assessment:   This is a routine wellness examination for Vanilla.  Hearing/Vision screen No exam data present  Dietary issues and exercise activities discussed: Current Exercise Habits: Home exercise routine, Type of exercise:  stretching;walking, Time (Minutes): 30, Frequency (Times/Week): 3, Weekly Exercise (Minutes/Week): 90, Intensity: Mild, Exercise limited by: orthopedic condition(s)  Goals    . Increase water intake     Starting 05/30/16, I will continue drinking 5 glasses of water a day.       Depression Screen PHQ 2/9 Scores 07/09/2020 01/23/2020 02/17/2019 01/27/2019 07/07/2018 06/30/2018 06/30/2018  PHQ - 2 Score 0 0 0 0 0 0 0  PHQ- 9 Score - 4 - - 4 2 -    Fall Risk Fall Risk  07/09/2020 07/05/2019 01/27/2019 09/16/2018 07/07/2018  Falls in the past year? 0 0 0 0 0  Number falls in past yr: 0 0 - 0 0  Injury with Fall? 0 0 - - 0  Follow up - - - Falls evaluation completed Falls evaluation completed    FALL RISK PREVENTION PERTAINING TO THE HOME:  Any stairs in or around the home? Yes  If so, are there any without handrails? No  Home free of loose throw rugs in walkways, pet beds, electrical cords, etc? Yes  Adequate lighting in your home to reduce risk of falls? Yes   ASSISTIVE DEVICES UTILIZED TO PREVENT FALLS:  Life alert? No  Use of a cane, walker or w/c? No  Grab bars in the bathroom? Yes  Shower chair or bench in shower? No  Elevated toilet seat or a handicapped toilet? Yes    Cognitive Function: Normal cognitive status assessed by observation by this Nurse Health Advisor. No abnormalities found.      6CIT Screen 06/30/2018 05/30/2016  What Year? 0 points 0 points  What month? 0 points 0 points  What time? 0 points 0 points  Count back from 20 0 points 0 points  Months in reverse 0 points 0 points  Repeat phrase 0 points 4 points  Total Score 0 4    Immunizations Immunization History  Administered Date(s) Administered  . Influenza Inj Mdck Quad Pf 04/10/2018  . Influenza Split 07/20/2007  . Influenza,inj,Quad PF,6+ Mos 04/05/2019  . Influenza-Unspecified 04/23/2013, 03/20/2017, 04/10/2018  . Pneumococcal Conjugate-13 05/11/2014  . Tdap 09/21/2015, 09/03/2016  . Zoster 01/13/2011     TDAP status: Up to date  Flu Vaccine status: Declined, Education has been provided regarding the importance of this vaccine but patient still declined. Advised may receive this vaccine at local pharmacy or Health Dept. Aware to provide a copy of the vaccination record if obtained from local pharmacy or Health Dept. Verbalized acceptance and understanding.  Pneumococcal vaccine status: Due, Education has been provided regarding the importance of this vaccine. Advised may receive this vaccine at local pharmacy or Health Dept. Aware to provide a copy of the vaccination record if obtained from local pharmacy or Health Dept. Verbalized acceptance and understanding.  Covid-19 vaccine status: Information provided on how to obtain vaccines. Pt is unsure if she wants to receive this or not.  Qualifies for Shingles Vaccine? Yes   Zostavax completed Yes   Shingrix Completed?: No.    Education has been provided regarding the importance of this vaccine. Patient has been advised to call insurance company to determine out of pocket expense if they have not yet received this vaccine. Advised may also receive vaccine at local pharmacy or Health Dept. Verbalized acceptance and understanding.  Screening Tests Health Maintenance  Topic Date Due  . COVID-19 Vaccine (1) Never done  . PNA vac Low Risk Adult (2 of 2 - PPSV23) 06/02/2020  . MAMMOGRAM  06/22/2020  . INFLUENZA VACCINE  09/20/2020 (Originally 01/22/2020)  . COLONOSCOPY (Pts 45-30yrs Insurance coverage will need to be confirmed)  01/10/2023  . DEXA SCAN  01/03/2024  . TETANUS/TDAP  09/04/2026  . Hepatitis C Screening  Completed  . HIV Screening  Completed    Health Maintenance  Health Maintenance Due  Topic Date Due  . COVID-19 Vaccine (1) Never done  . PNA vac Low Risk Adult (2 of 2 - PPSV23) 06/02/2020  . MAMMOGRAM  06/22/2020    Colorectal cancer screening: Type of screening: Colonoscopy. Completed 01/10/20. Repeat every 3  years  Mammogram status: Ordered 11/08/19. Pt provided with contact info and advised to call to schedule appt. Pt plans to schedule before 10/2020.  Bone Density status: Completed 01/03/19. Results reflect: Bone density results: NORMAL. Repeat every 5 years.  Lung Cancer Screening: (Low Dose CT Chest recommended if Age 60-80 years, 30 pack-year currently smoking OR have quit w/in 15years.) does not qualify.   Additional Screening:  Hepatitis C Screening: Up to date  Vision Screening: Recommended annual ophthalmology exams for early detection of glaucoma and other disorders of the eye. Is the patient up to date with their annual eye exam?  Yes  Who is the provider or what is the name of the office in which the patient attends annual eye exams? Dr Wallace Going @ [place] If pt is not established with a provider, would they like to be referred to a provider to establish care? No .   Dental Screening: Recommended annual dental exams for proper oral hygiene  Community Resource Referral / Chronic Care Management: CRR required this visit?  No   CCM required this visit?  No      Plan:     I have personally reviewed and noted the following in the patient's chart:   . Medical and social history .  Use of alcohol, tobacco or illicit drugs  . Current medications and supplements . Functional ability and status . Nutritional status . Physical activity . Advanced directives . List of other physicians . Hospitalizations, surgeries, and ER visits in previous 12 months . Vitals . Screenings to include cognitive, depression, and falls . Referrals and appointments  In addition, I have reviewed and discussed with patient certain preventive protocols, quality metrics, and best practice recommendations. A written personalized care plan for preventive services as well as general preventive health recommendations were provided to patient.     Hamdi Vari [place], [place]   5/46/2703   Nurse Notes: Pt would  like to receive her Pneumovax 23 vaccine at next in office apt. Pt is undecided about receiving the Covid vaccines. Pt plans to call and schedule a mammogram for this year. Advised pt the order is on file and is good until 10/2020.

## 2020-07-08 ENCOUNTER — Other Ambulatory Visit: Payer: Self-pay | Admitting: Family Medicine

## 2020-07-09 ENCOUNTER — Ambulatory Visit (INDEPENDENT_AMBULATORY_CARE_PROVIDER_SITE_OTHER): Payer: Medicare Other

## 2020-07-09 ENCOUNTER — Other Ambulatory Visit: Payer: Self-pay

## 2020-07-09 DIAGNOSIS — Z0000 Encounter for general adult medical examination without abnormal findings: Secondary | ICD-10-CM | POA: Diagnosis not present

## 2020-07-09 NOTE — Patient Instructions (Signed)
Courtney Ross , Thank you for taking time to come for your Medicare Wellness Visit. I appreciate your ongoing commitment to your health goals. Please review the following plan we discussed and let me know if I can assist you in the future.   Screening recommendations/referrals: Colonoscopy: Up to date, due 01/10/2023 Mammogram: Currently due. Order on file. Please call [phone] to schedule your mammogram.  Bone Density: Up to date, due 12/2023 Recommended yearly ophthalmology/optometry visit for glaucoma screening and checkup Recommended yearly dental visit for hygiene and checkup  Vaccinations: Influenza vaccine: Currently due, declined receiving at this time.  Pneumococcal vaccine: Pneumovax 23 due. Will receive at next in office apt.  Tdap vaccine: Up to date, due 08/2026 Shingles vaccine: Shingrix discussed. Please contact your pharmacy for coverage information.     Advanced directives: Currently on file.   Conditions/risks identified: Continue to increase water intake to 6-8 8 oz glasses a day.   Next appointment: 07/27/20 @ 1:40 PM with Dr Caryn Section    Preventive Care 66 Years and Older, Female Preventive care refers to lifestyle choices and visits with your health care provider that can promote health and wellness. What does preventive care include?  A yearly physical exam. This is also called an annual well check.  Dental exams once or twice a year.  Routine eye exams. Ask your health care provider how often you should have your eyes checked.  Personal lifestyle choices, including:  Daily care of your teeth and gums.  Regular physical activity.  Eating a healthy diet.  Avoiding tobacco and drug use.  Limiting alcohol use.  Practicing safe sex.  Taking low-dose aspirin every day.  Taking vitamin and mineral supplements as recommended by your health care provider. What happens during an annual well check? The services and screenings done by your health care provider  during your annual well check will depend on your age, overall health, lifestyle risk factors, and family history of disease. Counseling  Your health care provider may ask you questions about your:  Alcohol use.  Tobacco use.  Drug use.  Emotional well-being.  Home and relationship well-being.  Sexual activity.  Eating habits.  History of falls.  Memory and ability to understand (cognition).  Work and work Statistician.  Reproductive health. Screening  You may have the following tests or measurements:  Height, weight, and BMI.  Blood pressure.  Lipid and cholesterol levels. These may be checked every 5 years, or more frequently if you are over 53 years old.  Skin check.  Lung cancer screening. You may have this screening every year starting at age 66 if you have a 30-pack-year history of smoking and currently smoke or have quit within the past 15 years.  Fecal occult blood test (FOBT) of the stool. You may have this test every year starting at age 66.  Flexible sigmoidoscopy or colonoscopy. You may have a sigmoidoscopy every 5 years or a colonoscopy every 10 years starting at age 66.  Hepatitis C blood test.  Hepatitis B blood test.  Sexually transmitted disease (STD) testing.  Diabetes screening. This is done by checking your blood sugar (glucose) after you have not eaten for a while (fasting). You may have this done every 1-3 years.  Bone density scan. This is done to screen for osteoporosis. You may have this done starting at age 66.  Mammogram. This may be done every 1-2 years. Talk to your health care provider about how often you should have regular mammograms. Talk with your  health care provider about your test results, treatment options, and if necessary, the need for more tests. Vaccines  Your health care provider may recommend certain vaccines, such as:  Influenza vaccine. This is recommended every year.  Tetanus, diphtheria, and acellular pertussis  (Tdap, Td) vaccine. You may need a Td booster every 10 years.  Zoster vaccine. You may need this after age 18.  Pneumococcal 13-valent conjugate (PCV13) vaccine. One dose is recommended after age 9.  Pneumococcal polysaccharide (PPSV23) vaccine. One dose is recommended after age 70. Talk to your health care provider about which screenings and vaccines you need and how often you need them. This information is not intended to replace advice given to you by your health care provider. Make sure you discuss any questions you have with your health care provider. Document Released: 07/06/2015 Document Revised: 02/27/2016 Document Reviewed: 04/10/2015 Elsevier Interactive Patient Education  2017 [employer] Prevention in the Home Falls can cause injuries. They can happen to people of all ages. There are many things you can do to make your home safe and to help prevent falls. What can I do on the outside of my home?  Regularly fix the edges of walkways and driveways and fix any cracks.  Remove anything that might make you trip as you walk through a door, such as a raised step or threshold.  Trim any bushes or trees on the path to your home.  Use bright outdoor lighting.  Clear any walking paths of anything that might make someone trip, such as rocks or tools.  Regularly check to see if handrails are loose or broken. Make sure that both sides of any steps have handrails.  Any raised decks and porches should have guardrails on the edges.  Have any leaves, snow, or ice cleared regularly.  Use sand or salt on walking paths during winter.  Clean up any spills in your garage right away. This includes oil or grease spills. What can I do in the bathroom?  Use night lights.  Install grab bars by the toilet and in the tub and shower. Do not use towel bars as grab bars.  Use non-skid mats or decals in the tub or shower.  If you need to sit down in the shower, use a plastic, non-slip  stool.  Keep the floor dry. Clean up any water that spills on the floor as soon as it happens.  Remove soap buildup in the tub or shower regularly.  Attach bath mats securely with double-sided non-slip rug tape.  Do not have throw rugs and other things on the floor that can make you trip. What can I do in the bedroom?  Use night lights.  Make sure that you have a light by your bed that is easy to reach.  Do not use any sheets or blankets that are too big for your bed. They should not hang down onto the floor.  Have a firm chair that has side arms. You can use this for support while you get dressed.  Do not have throw rugs and other things on the floor that can make you trip. What can I do in the kitchen?  Clean up any spills right away.  Avoid walking on wet floors.  Keep items that you use a lot in easy-to-reach places.  If you need to reach something above you, use a strong step stool that has a grab bar.  Keep electrical cords out of the way.  Do not use  floor polish or wax that makes floors slippery. If you must use wax, use non-skid floor wax.  Do not have throw rugs and other things on the floor that can make you trip. What can I do with my stairs?  Do not leave any items on the stairs.  Make sure that there are handrails on both sides of the stairs and use them. Fix handrails that are broken or loose. Make sure that handrails are as long as the stairways.  Check any carpeting to make sure that it is firmly attached to the stairs. Fix any carpet that is loose or worn.  Avoid having throw rugs at the top or bottom of the stairs. If you do have throw rugs, attach them to the floor with carpet tape.  Make sure that you have a light switch at the top of the stairs and the bottom of the stairs. If you do not have them, ask someone to add them for you. What else can I do to help prevent falls?  Wear shoes that:  Do not have high heels.  Have rubber bottoms.  Are  comfortable and fit you well.  Are closed at the toe. Do not wear sandals.  If you use a stepladder:  Make sure that it is fully opened. Do not climb a closed stepladder.  Make sure that both sides of the stepladder are locked into place.  Ask someone to hold it for you, if possible.  Clearly mark and make sure that you can see:  Any grab bars or handrails.  First and last steps.  Where the edge of each step is.  Use tools that help you move around (mobility aids) if they are needed. These include:  Canes.  Walkers.  Scooters.  Crutches.  Turn on the lights when you go into a dark area. Replace any light bulbs as soon as they burn out.  Set up your furniture so you have a clear path. Avoid moving your furniture around.  If any of your floors are uneven, fix them.  If there are any pets around you, be aware of where they are.  Review your medicines with your doctor. Some medicines can make you feel dizzy. This can increase your chance of falling. Ask your doctor what other things that you can do to help prevent falls. This information is not intended to replace advice given to you by your health care provider. Make sure you discuss any questions you have with your health care provider. Document Released: 04/05/2009 Document Revised: 11/15/2015 Document Reviewed: 07/14/2014 Elsevier Interactive Patient Education  2017 [employer].

## 2020-07-11 DIAGNOSIS — M7742 Metatarsalgia, left foot: Secondary | ICD-10-CM | POA: Diagnosis not present

## 2020-07-11 DIAGNOSIS — M79672 Pain in left foot: Secondary | ICD-10-CM | POA: Diagnosis not present

## 2020-07-11 DIAGNOSIS — M216X2 Other acquired deformities of left foot: Secondary | ICD-10-CM | POA: Diagnosis not present

## 2020-07-11 DIAGNOSIS — M2042 Other hammer toe(s) (acquired), left foot: Secondary | ICD-10-CM | POA: Diagnosis not present

## 2020-07-11 DIAGNOSIS — M216X1 Other acquired deformities of right foot: Secondary | ICD-10-CM | POA: Diagnosis not present

## 2020-07-27 ENCOUNTER — Telehealth (INDEPENDENT_AMBULATORY_CARE_PROVIDER_SITE_OTHER): Payer: Medicare Other | Admitting: Family Medicine

## 2020-07-27 ENCOUNTER — Other Ambulatory Visit: Payer: Self-pay

## 2020-07-27 DIAGNOSIS — E785 Hyperlipidemia, unspecified: Secondary | ICD-10-CM | POA: Diagnosis not present

## 2020-07-27 DIAGNOSIS — I10 Essential (primary) hypertension: Secondary | ICD-10-CM

## 2020-07-27 DIAGNOSIS — Z03818 Encounter for observation for suspected exposure to other biological agents ruled out: Secondary | ICD-10-CM | POA: Diagnosis not present

## 2020-07-27 DIAGNOSIS — Z20822 Contact with and (suspected) exposure to covid-19: Secondary | ICD-10-CM

## 2020-07-27 DIAGNOSIS — E038 Other specified hypothyroidism: Secondary | ICD-10-CM

## 2020-07-27 DIAGNOSIS — E559 Vitamin D deficiency, unspecified: Secondary | ICD-10-CM | POA: Diagnosis not present

## 2020-07-27 DIAGNOSIS — R110 Nausea: Secondary | ICD-10-CM | POA: Diagnosis not present

## 2020-07-27 DIAGNOSIS — E065 Other chronic thyroiditis: Secondary | ICD-10-CM | POA: Diagnosis not present

## 2020-07-27 MED ORDER — ONDANSETRON 4 MG PO TBDP: 4.0000 mg | ORAL_TABLET | Freq: Three times a day (TID) | ORAL | 0 refills | Status: AC | PRN

## 2020-07-27 NOTE — Progress Notes (Signed)
Established patient visit   Patient: Courtney Ross   DOB: Oct 01, 1955   65 y.o. Female  MRN: [id number] Visit Date: 07/27/2020  Today's healthcare provider: Lelon Huh, MD   No chief complaint on file.  Subjective    HPI  Depression, Follow-up  She  was last seen for this 6 months ago. Changes made at last visit include none. She was doing very well with current medications and wished to continue without change.   She reports good compliance with treatment. She is not having side effects.   She reports good tolerance of treatment.  She feels she is Improved since last visit.  Depression screen [facility] 2/9 07/09/2020 01/23/2020 02/17/2019  Decreased Interest 0 0 0  Down, Depressed, Hopeless 0 0 0  PHQ - 2 Score 0 0 0  Altered sleeping - 3 -  Tired, decreased energy - 1 -  Change in appetite - 0 -  Feeling bad or failure about yourself  - 0 -  Trouble concentrating - 0 -  Moving slowly or fidgety/restless - 0 -  Suicidal thoughts - 0 -  PHQ-9 Score - 4 -  Difficult doing work/chores - Somewhat difficult -  Some recent data might be hidden    -----------------------------------------------------------------------------------------  Follow up for Vitamin D Deficiency:  The patient was last seen for this 6 months ago. Changes made at last visit include none. Labs were ordered but not completed.  She reports good compliance with treatment. She feels that condition is Unchanged. She is not having side effects.   -----------------------------------------------------------------------------------------  Hypertension, follow-up  BP Readings from Last 3 Encounters:  05/08/20 (!) 161/100  04/11/20 131/86  03/22/20 138/84   Wt Readings from Last 3 Encounters:  05/08/20 182 lb (82.6 kg)  04/11/20 183 lb (83 kg)  03/22/20 190 lb (86.2 kg)     She was last seen for hypertension 6 months ago.  BP at that visit was 112/70. Management since that visit includes  continuing same medications.  She reports good compliance with treatment. She is not having side effects.  She is following a regular diet. She is not exercising. She does not smoke.  Use of agents associated with hypertension: NSAIDS.     The 10-year ASCVD risk score Mikey Bussing DC Brooke Bonito., et al., 2013) is: 8.2%   ---------------------------------------------------------------------------------------------------  Follow up for menopausal symptoms:  The patient was last seen for this 6 months ago. During last visit patient was counseled that topical estrogen may be safer long term treatment than oral ERT. Patient was to wean to 1/2 tablet daily and consider adding estrogen if she had trouble with vaginal dryness. -----------------------------------------------------------------------------------------  Follow up for chronic pain:  The patient was last seen for this 6 months ago. Changes made at last visit include none. Pain was fairly well controlled on current medication regiment. She was to continue follow up with pain clinic as scheduled.  She reports good compliance with treatment. She feels that condition is Unchanged. She is not having side effects.   -----------------------------------------------------------------------------------------  Had Covid sx starting on 07-23-2019. Has several Covid exposures. Is nauseated, but no other persistent Covid symptoms.       Medications: Outpatient Medications Prior to Visit  Medication Sig  . aspirin EC 81 MG tablet Take 81 mg by mouth at bedtime.  Marland Kitchen buPROPion (WELLBUTRIN SR) 150 MG 12 hr tablet TAKE ONE TABLET 3 TIMES DAILY  . Cholecalciferol (VITAMIN D3) 5000 UNITS CAPS Take 5,000 Units by  mouth daily.   . Coenzyme Q10 200 MG capsule Take 200 mg by mouth daily.  . cyclobenzaprine (FLEXERIL) 10 MG tablet Take 1 tablet (10 mg total) by mouth 3 (three) times daily as needed for muscle spasms.  . diazepam (VALIUM) 10 MG tablet TAKE ONE  TABLET EVERY 6 HOURS AS NEEDED FOR ANXIETY  . diclofenac Sodium (VOLTAREN) 1 % GEL SMARTSIG:1 Gram(s) Topical 4 Times Daily PRN  . estradiol (ESTRACE) 1 MG tablet TAKE ONE TABLET EVERY DAY (Patient not taking: Reported on 07/09/2020)  . fluticasone (FLONASE) 50 MCG/ACT nasal spray Place 1 spray into both nostrils daily as needed for allergies or rhinitis.  . hydroquinone 4 % cream Apply 1 application topically daily.  Marland Kitchen LINZESS 145 MCG CAPS capsule TAKE 1 CAPSULE EVERY DAY  . meloxicam (MOBIC) 15 MG tablet Take 15 mg by mouth at bedtime.   . Multiple Vitamin (MULTIVITAMIN WITH MINERALS) TABS tablet Take 1 tablet by mouth daily.  . mupirocin cream (BACTROBAN) 2 % APPLY TO AFFECTED AREAS TOPICALLY TWICE DAILY  . MYRBETRIQ 25 MG TB24 tablet TAKE 1 TABLET BY MOUTH DAILY  . omeprazole (PRILOSEC) 40 MG capsule Take 1 capsule (40 mg total) by mouth daily. (Patient taking differently: Take 40 mg by mouth daily as needed (heartburn).)  . oxyCODONE (ROXICODONE) 15 MG immediate release tablet TAKE 1-2 TABLETS BY MOUTH EVERY 4 HOURS FOR PAIN.  Marland Kitchen phentermine 37.5 MG capsule Take 1 capsule (37.5 mg total) by mouth every morning.  . polyethylene glycol (MIRALAX / GLYCOLAX) packet Take 17 g by mouth 2 (two) times daily as needed for mild constipation (dissolve in an 8 oz glass of water).  . pregabalin (LYRICA) 75 MG capsule TAKE 1 CAPSULE BY MOUTH 3 TIMES DAILY (Patient taking differently: Take 150 mg by mouth 2 (two) times daily.)  . RESTASIS 0.05 % ophthalmic emulsion Place 1 drop into both eyes 2 (two) times daily.   Marland Kitchen rOPINIRole (REQUIP) 2 MG tablet TAKE 1 TABLET BY MOUTH AT BEDTIME  . rosuvastatin (CRESTOR) 10 MG tablet TAKE ONE TABLET EVERY DAY  . Turmeric 500 MG CAPS Take 500 mg by mouth daily.    No facility-administered medications prior to visit.       Objective      Physical Exam  Awake, alert, oriented x 3. In no apparent distress   No results found for any visits on 07/27/20.  Assessment  & Plan     1. Essential hypertension Doing well with current medications.   2. Nausea - ondansetron (ZOFRAN-ODT) 4 MG disintegrating tablet; Take 1 tablet (4 mg total) by mouth every 8 (eight) hours as needed for nausea or vomiting.  Dispense: 12 tablet; Refill: 0  3. Close exposure to COVID-19 virus Started on 07-22-2020. Advised to stay out of medication facilities until 07-01-2020 unless she has urgent medical need. 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 Vitamin D deficiency  - VITAMIN D 25 Hydroxy (Vit-D Deficiency, Fractures)  6. Dyslipidemia She is tolerating rosuvastatin well with no adverse effects.   - Comprehensive metabolic panel - Lipid panel   No follow-ups on file.      The entirety of the information documented in the History of Present Illness, Review of Systems and Physical Exam were personally obtained by me. Portions of this information were initially documented by the CMA and reviewed by me for thoroughness and accuracy.  Lelon Huh, MD  [facility] [phone] (phone) [phone] (fax)  [place]

## 2020-07-31 DIAGNOSIS — Z03818 Encounter for observation for suspected exposure to other biological agents ruled out: Secondary | ICD-10-CM | POA: Diagnosis not present

## 2020-07-31 DIAGNOSIS — Z20822 Contact with and (suspected) exposure to covid-19: Secondary | ICD-10-CM | POA: Diagnosis not present

## 2020-08-01 DIAGNOSIS — E065 Other chronic thyroiditis: Secondary | ICD-10-CM | POA: Diagnosis not present

## 2020-08-01 DIAGNOSIS — E559 Vitamin D deficiency, unspecified: Secondary | ICD-10-CM | POA: Diagnosis not present

## 2020-08-01 DIAGNOSIS — E785 Hyperlipidemia, unspecified: Secondary | ICD-10-CM | POA: Diagnosis not present

## 2020-08-01 DIAGNOSIS — E038 Other specified hypothyroidism: Secondary | ICD-10-CM | POA: Diagnosis not present

## 2020-08-02 LAB — COMPREHENSIVE METABOLIC PANEL
ALT: 18 IU/L (ref 0–32)
AST: 18 IU/L (ref 0–40)
Albumin/Globulin Ratio: 1.7 (ref 1.2–2.2)
Albumin: 4.1 g/dL (ref 3.8–4.8)
Alkaline Phosphatase: 100 IU/L (ref 44–121)
BUN/Creatinine Ratio: 17 (ref 12–28)
BUN: 15 mg/dL (ref 8–27)
Bilirubin Total: 0.4 mg/dL (ref 0.0–1.2)
CO2: 20 mmol/L (ref 20–29)
Calcium: 8.8 mg/dL (ref 8.7–10.3)
Chloride: 105 mmol/L (ref 96–106)
Creatinine, Ser: 0.9 mg/dL (ref 0.57–1.00)
GFR calc Af Amer: 78 mL/min/{1.73_m2} (ref >59)
GFR calc non Af Amer: 67 mL/min/{1.73_m2} (ref >59)
Globulin, Total: 2.4 g/dL (ref 1.5–4.5)
Glucose: 88 mg/dL (ref 65–99)
Potassium: 4.1 mmol/L (ref 3.5–5.2)
Sodium: 141 mmol/L (ref 134–144)
Total Protein: 6.5 g/dL (ref 6.0–8.5)

## 2020-08-02 LAB — LIPID PANEL
Chol/HDL Ratio: 4.3 ratio (ref 0.0–4.4)
Cholesterol, Total: 203 mg/dL — ABNORMAL HIGH (ref 100–199)
HDL: 47 mg/dL (ref >39)
LDL Chol Calc (NIH): 134 mg/dL — ABNORMAL HIGH (ref 0–99)
Triglycerides: 124 mg/dL (ref 0–149)
VLDL Cholesterol Cal: 22 mg/dL (ref 5–40)

## 2020-08-02 LAB — TSH: TSH: 2.79 u[IU]/mL (ref 0.450–4.500)

## 2020-08-02 LAB — VITAMIN D 25 HYDROXY (VIT D DEFICIENCY, FRACTURES): Vit D, 25-Hydroxy: 52.1 ng/mL (ref 30.0–100.0)

## 2020-08-09 ENCOUNTER — Other Ambulatory Visit: Payer: Self-pay | Admitting: Family Medicine

## 2020-08-09 DIAGNOSIS — F324 Major depressive disorder, single episode, in partial remission: Secondary | ICD-10-CM

## 2020-08-09 NOTE — Telephone Encounter (Signed)
Requested Prescriptions  Pending Prescriptions Disp Refills  . buPROPion (WELLBUTRIN SR) 150 MG 12 hr tablet [Pharmacy Med Name: BUPROPION HCL ER (SR) 150 MG TAB] 270 tablet 0    Sig: TAKE ONE TABLET 3 TIMES DAILY     Psychiatry: Antidepressants - bupropion Failed - 08/09/2020  1:29 PM      Failed - Last BP in normal range    BP Readings from Last 1 Encounters:  05/08/20 (!) 161/100         Passed - Completed PHQ-2 or PHQ-9 in the last 360 days      Passed - Valid encounter within last 6 months    Recent Outpatient Visits          1 week ago Essential hypertension   [facility] Birdie Sons, MD   6 months ago Vitamin D deficiency   [facility], Kirstie Peri, MD   2 years ago Annual physical exam   [facility] Birdie Sons, MD   3 years ago Elevated blood pressure reading   [facility] Birdie Sons, MD   3 years ago Essential hypertension   [place], Kirstie Peri, MD      Future Appointments            In 3 months Rubie Maid, MD [facility]           . estradiol (ESTRACE) 1 MG tablet [Pharmacy Med Name: ESTRADIOL 1 MG TAB] 90 tablet 0    Sig: TAKE ONE TABLET EVERY DAY     OB/GYN:  Estrogens Failed - 08/09/2020  1:29 PM      Failed - Mammogram is up-to-date per Health Maintenance      Failed - Last BP in normal range    BP Readings from Last 1 Encounters:  05/08/20 (!) 161/100         Passed - Valid encounter within last 12 months    Recent Outpatient Visits          1 week ago Essential hypertension   [facility] Birdie Sons, MD   6 months ago Vitamin D deficiency   [facility] Birdie Sons, MD   2 years ago Annual physical exam   [facility] Birdie Sons, MD   3 years ago Elevated blood pressure reading   [facility] Birdie Sons, MD   3 years ago Essential hypertension    [facility] Birdie Sons, MD      Future Appointments            In 3 months Rubie Maid, MD [facility]

## 2020-08-23 ENCOUNTER — Encounter: Payer: Self-pay | Admitting: Family Medicine

## 2020-08-24 MED ORDER — CYCLOBENZAPRINE HCL 10 MG PO TABS: 10.0000 mg | ORAL_TABLET | Freq: Three times a day (TID) | ORAL | 5 refills | Status: DC | PRN

## 2020-08-24 MED ORDER — DIAZEPAM 10 MG PO TABS: ORAL_TABLET | ORAL | 5 refills | Status: DC

## 2020-08-27 ENCOUNTER — Other Ambulatory Visit: Payer: Self-pay | Admitting: Family Medicine

## 2020-08-27 NOTE — Telephone Encounter (Signed)
Copied from [place] [id number]. Topic: Quick Communication - Rx Refill/Question >> Aug 27, 2020 10:29 AM Tessa Lerner A wrote: Medication: oxyCODONE (ROXICODONE) 15 MG immediate release tablet   Has the patient contacted their pharmacy? No. Patient has been directed to contact PCP due to medication's classification  Preferred Pharmacy (with phone number or street name): [place], [place] - [address]  Phone:  [phone]  Agent: Please be advised that RX refills may take up to 3 business days. We ask that you follow-up with your pharmacy.

## 2020-08-27 NOTE — Telephone Encounter (Signed)
Requested medication (s) are due for refill today: yes  Requested medication (s) are on the active medication list: yes  Last refill:  07/03/2020  Future visit scheduled: no  Notes to clinic: this refill cannot be delegated   Requested Prescriptions  Pending Prescriptions Disp Refills   oxyCODONE (ROXICODONE) 15 MG immediate release tablet 180 tablet 0    Sig: TAKE 1-2 TABLETS BY MOUTH EVERY 4 HOURS FOR PAIN.      Not Delegated - Analgesics:  Opioid Agonists Failed - 08/27/2020 10:34 AM      Failed - This refill cannot be delegated      Failed - Urine Drug Screen completed in last 360 days      Passed - Valid encounter within last 6 months    Recent Outpatient Visits           1 month ago Essential hypertension   [facility] Birdie Sons, MD   7 months ago Vitamin D deficiency   [facility] Birdie Sons, MD   2 years ago Annual physical exam   [facility] Birdie Sons, MD   3 years ago Elevated blood pressure reading   [facility] Birdie Sons, MD   3 years ago Essential hypertension   [facility] Birdie Sons, MD       Future Appointments             In 2 months Rubie Maid, MD [facility]

## 2020-08-29 MED ORDER — OXYCODONE HCL 15 MG PO TABS: ORAL_TABLET | ORAL | 0 refills | Status: DC

## 2020-09-04 ENCOUNTER — Other Ambulatory Visit: Payer: Self-pay | Admitting: Family Medicine

## 2020-09-10 ENCOUNTER — Encounter: Payer: Self-pay | Admitting: Family Medicine

## 2020-09-25 DIAGNOSIS — M961 Postlaminectomy syndrome, not elsewhere classified: Secondary | ICD-10-CM | POA: Diagnosis not present

## 2020-09-25 DIAGNOSIS — Z981 Arthrodesis status: Secondary | ICD-10-CM | POA: Diagnosis not present

## 2020-09-26 ENCOUNTER — Other Ambulatory Visit: Payer: Self-pay | Admitting: Family Medicine

## 2020-09-26 MED ORDER — OXYCODONE HCL 15 MG PO TABS: ORAL_TABLET | ORAL | 0 refills | Status: DC

## 2020-09-26 NOTE — Telephone Encounter (Signed)
Medication Refill - Medication: oxyCODONE (ROXICODONE) 15 MG immediate release tablet     Preferred Pharmacy (with phone number or street name):    [place], [place] - [address] Phone:  [phone]  Fax:  [phone]       Agent: Please be advised that RX refills may take up to 3 business days. We ask that you follow-up with your pharmacy.

## 2020-09-26 NOTE — Telephone Encounter (Signed)
Requested medication (s) are due for refill today: yes  Requested medication (s) are on the active medication list:yes  Last refill: 08/29/2020  Future visit scheduled: yes  Notes to clinic: this refill cannot be delegated    Requested Prescriptions  Pending Prescriptions Disp Refills   oxyCODONE (ROXICODONE) 15 MG immediate release tablet 180 tablet 0    Sig: TAKE 1-2 TABLETS BY MOUTH EVERY 4 HOURS FOR PAIN.      There is no refill protocol information for this order

## 2020-10-05 ENCOUNTER — Other Ambulatory Visit: Payer: Self-pay | Admitting: Family Medicine

## 2020-10-05 DIAGNOSIS — F324 Major depressive disorder, single episode, in partial remission: Secondary | ICD-10-CM

## 2020-10-24 ENCOUNTER — Other Ambulatory Visit: Payer: Self-pay | Admitting: Family Medicine

## 2020-10-24 MED ORDER — OXYCODONE HCL 15 MG PO TABS: ORAL_TABLET | ORAL | 0 refills | Status: DC

## 2020-10-24 NOTE — Telephone Encounter (Signed)
Requested medication (s) are due for refill today: yes  Requested medication (s) are on the active medication list: yes  Last refill:  09/26/2020  Future visit scheduled: no  Notes to clinic:  this refill cannot be delegated    Requested Prescriptions  Pending Prescriptions Disp Refills   oxyCODONE (ROXICODONE) 15 MG immediate release tablet 180 tablet 0    Sig: TAKE 1-2 TABLETS BY MOUTH EVERY 4 HOURS FOR PAIN.      There is no refill protocol information for this order

## 2020-10-24 NOTE — Telephone Encounter (Signed)
Copied from [place] [id number]. Topic: Quick Communication - Rx Refill/Question >> Oct 24, 2020  9:55 AM Leward Quan A wrote: Medication: oxyCODONE (ROXICODONE) 15 MG immediate release tablet  Has the patient contacted their pharmacy? Yes.   (Agent: If no, request that the patient contact the pharmacy for the refill.) (Agent: If yes, when and what did the pharmacy advise?)  Preferred Pharmacy (with phone number or street name): [place], [place] - [place]  Phone:  [phone] Fax:  [phone]     Agent: Please be advised that RX refills may take up to 3 business days. We ask that you follow-up with your pharmacy.

## 2020-11-06 ENCOUNTER — Other Ambulatory Visit: Payer: Self-pay | Admitting: Family Medicine

## 2020-11-06 DIAGNOSIS — E7800 Pure hypercholesterolemia, unspecified: Secondary | ICD-10-CM

## 2020-11-08 ENCOUNTER — Encounter: Payer: Medicare Other | Admitting: Obstetrics and Gynecology

## 2020-11-09 ENCOUNTER — Encounter: Payer: Self-pay | Admitting: Obstetrics and Gynecology

## 2020-11-22 ENCOUNTER — Other Ambulatory Visit: Payer: Self-pay | Admitting: Family Medicine

## 2020-11-22 MED ORDER — OXYCODONE HCL 15 MG PO TABS: ORAL_TABLET | ORAL | 0 refills | Status: DC

## 2020-11-22 NOTE — Telephone Encounter (Signed)
Medication Refill - Medication: oxyCODONE (ROXICODONE) 15 MG immediate release tablet     Preferred Pharmacy (with phone number or street name): [place], [place] - [place]: Please be advised that RX refills may take up to 3 business days. We ask that you follow-up with your pharmacy.

## 2020-11-22 NOTE — Telephone Encounter (Signed)
Requested medication (s) are due for refill today - yes  Requested medication (s) are on the active medication list -yes  Future visit scheduled -no  Last refill: 10/24/20  Notes to clinic: Request non delegated Rx  Requested Prescriptions  Pending Prescriptions Disp Refills   oxyCODONE (ROXICODONE) 15 MG immediate release tablet 180 tablet 0    Sig: TAKE 1-2 TABLETS BY MOUTH EVERY 4 HOURS FOR PAIN.      Not Delegated - Analgesics:  Opioid Agonists Failed - 11/22/2020  9:29 AM      Failed - This refill cannot be delegated      Failed - Urine Drug Screen completed in last 360 days      Passed - Valid encounter within last 6 months    Recent Outpatient Visits           3 months ago Essential hypertension   [facility] Birdie Sons, MD   10 months ago Vitamin D deficiency   [place], Kirstie Peri, MD   2 years ago Annual physical exam   [facility] Birdie Sons, MD   3 years ago Elevated blood pressure reading   [facility] Birdie Sons, MD   3 years ago Essential hypertension   [facility] Birdie Sons, MD                    Requested Prescriptions  Pending Prescriptions Disp Refills   oxyCODONE (ROXICODONE) 15 MG immediate release tablet 180 tablet 0    Sig: TAKE 1-2 TABLETS BY MOUTH EVERY 4 HOURS FOR PAIN.      Not Delegated - Analgesics:  Opioid Agonists Failed - 11/22/2020  9:29 AM      Failed - This refill cannot be delegated      Failed - Urine Drug Screen completed in last 360 days      Passed - Valid encounter within last 6 months    Recent Outpatient Visits           3 months ago Essential hypertension   [facility] Birdie Sons, MD   10 months ago Vitamin D deficiency   [facility] Birdie Sons, MD   2 years ago Annual physical exam   [facility] Birdie Sons, MD   3 years ago Elevated blood  pressure reading   [facility] Birdie Sons, MD   3 years ago Essential hypertension   [facility] Birdie Sons, MD

## 2020-12-25 ENCOUNTER — Other Ambulatory Visit: Payer: Self-pay

## 2020-12-25 MED ORDER — OXYCODONE HCL 15 MG PO TABS: ORAL_TABLET | ORAL | 0 refills | Status: DC

## 2020-12-25 NOTE — Telephone Encounter (Signed)
Copied from [place] [id number]. Topic: Quick Communication - Rx Refill/Question >> Dec 25, 2020 11:49 AM Loma Boston wrote: Medication: oxyCODONE (ROXICODONE) 15 MG immediate release tablet 180 tablet 0 11/22/2020   Sig: TAKE 1-2 TABLETS BY MOUTH EVERY 4 HOURS FOR PAIN.  Sent to pharmacy as: oxyCODONE (ROXICODONE) 15 MG immediate release tablet  Earliest Fill Date: 11/22/2020     Has the patient contacted their pharmacy? Yes.   (Agent: If no, request that the patient contact the pharmacy for the refill.) (Agent: If yes, when and what did the pharmacy advise?)call dr  Preferred Pharmacy (with phone number or street name): [place], [place] - [address] [place] [address] Phone: [phone] Fax: [phone] Hours: Not open 24 hours    Agent: Please be advised that RX refills may take up to 3 business days. We ask that you follow-up with your pharmacy.

## 2021-01-06 ENCOUNTER — Other Ambulatory Visit: Payer: Self-pay | Admitting: Family Medicine

## 2021-01-19 ENCOUNTER — Other Ambulatory Visit: Payer: Self-pay | Admitting: Family Medicine

## 2021-01-19 NOTE — Telephone Encounter (Signed)
Requested medication (s) are due for refill today: yes in 2 weeks  Requested medication (s) are on the active medication list: yes  Last refill:  11/06/20 # 90  Future visit scheduled: no  Notes to clinic:  last mammogram 06/22/2018   Requested Prescriptions  Pending Prescriptions Disp Refills   estradiol (ESTRACE) 1 MG tablet [Pharmacy Med Name: ESTRADIOL 1 MG TAB] 90 tablet 0    Sig: TAKE ONE TABLET EVERY DAY      OB/GYN:  Estrogens Failed - 01/19/2021  9:08 AM      Failed - Mammogram is up-to-date per Health Maintenance      Failed - Last BP in normal range    BP Readings from Last 1 Encounters:  05/08/20 (!) 161/100          Passed - Valid encounter within last 12 months    Recent Outpatient Visits           5 months ago Essential hypertension   [facility] Birdie Sons, MD   12 months ago Vitamin D deficiency   [place], Kirstie Peri, MD   2 years ago Annual physical exam   [facility] Birdie Sons, MD   3 years ago Elevated blood pressure reading   [facility] Birdie Sons, MD   3 years ago Essential hypertension   [place], Kirstie Peri, MD

## 2021-01-21 ENCOUNTER — Other Ambulatory Visit: Payer: Self-pay | Admitting: Family Medicine

## 2021-01-21 NOTE — Telephone Encounter (Signed)
Requested medication (s) are due for refill today: Yes  Requested medication (s) are on the active medication list: Yes  Last refill:  12/25/20  Future visit scheduled: Yes  Notes to clinic:  See request.    Requested Prescriptions  Pending Prescriptions Disp Refills   oxyCODONE (ROXICODONE) 15 MG immediate release tablet 180 tablet 0    Sig: TAKE 1-2 TABLETS BY MOUTH EVERY 4 HOURS FOR PAIN.      Not Delegated - Analgesics:  Opioid Agonists Failed - 01/21/2021  2:49 PM      Failed - This refill cannot be delegated      Failed - Urine Drug Screen completed in last 360 days      Passed - Valid encounter within last 6 months    Recent Outpatient Visits           5 months ago Essential hypertension   [facility] Birdie Sons, MD   12 months ago Vitamin D deficiency   [facility] Birdie Sons, MD   2 years ago Annual physical exam   [facility] Birdie Sons, MD   3 years ago Elevated blood pressure reading   [facility] Birdie Sons, MD   3 years ago Essential hypertension   [facility] Birdie Sons, MD

## 2021-01-21 NOTE — Telephone Encounter (Signed)
Medication Refill - Medication: oxycodone 15 mg  Has the patient contacted their pharmacy? No. Controlled substance. Pt last seen dr Caryn Section feb 2022  Preferred Pharmacy (with phone number or street name): [facility] [address] in [place] phone number [phone]  Agent: Please be advised that RX refills may take up to 3 business days. We ask that you follow-up with your pharmacy.

## 2021-01-22 MED ORDER — OXYCODONE HCL 15 MG PO TABS: ORAL_TABLET | ORAL | 0 refills | Status: DC

## 2021-02-04 ENCOUNTER — Encounter: Payer: Self-pay | Admitting: Ophthalmology

## 2021-02-07 ENCOUNTER — Other Ambulatory Visit: Payer: Self-pay | Admitting: Family Medicine

## 2021-02-07 DIAGNOSIS — F324 Major depressive disorder, single episode, in partial remission: Secondary | ICD-10-CM

## 2021-02-07 NOTE — Telephone Encounter (Signed)
Requested medications are due for refill today 1 month early  Requested medications are on the active medication list yes  Last refill 6/15 (not mail order)  Last visit Last visit to address med/dx 01/2020  Future visit scheduled yes, 02/18/21  Notes to clinic Failed protocol due to no valid visit within 6  months, however, does have appt scheduled.

## 2021-02-07 NOTE — Discharge Instructions (Signed)

## 2021-02-11 ENCOUNTER — Other Ambulatory Visit: Payer: Self-pay

## 2021-02-11 ENCOUNTER — Encounter: Payer: Self-pay | Admitting: Ophthalmology

## 2021-02-11 ENCOUNTER — Ambulatory Visit: Payer: 59 | Admitting: Anesthesiology

## 2021-02-11 ENCOUNTER — Ambulatory Visit
Admission: RE | Admit: 2021-02-11 | Discharge: 2021-02-11 | Disposition: A | Discharge status: Home / Self Care | Payer: 59 | Attending: Ophthalmology | Admitting: Ophthalmology

## 2021-02-11 ENCOUNTER — Encounter
Admission: RE | Disposition: A | Discharge status: Home / Self Care | Payer: Self-pay | Source: Home / Self Care | Attending: Ophthalmology

## 2021-02-11 DIAGNOSIS — Z7982 Long term (current) use of aspirin: Secondary | ICD-10-CM | POA: Insufficient documentation

## 2021-02-11 DIAGNOSIS — H2511 Age-related nuclear cataract, right eye: Secondary | ICD-10-CM | POA: Diagnosis not present

## 2021-02-11 DIAGNOSIS — Z79890 Hormone replacement therapy (postmenopausal): Secondary | ICD-10-CM | POA: Insufficient documentation

## 2021-02-11 DIAGNOSIS — Z791 Long term (current) use of non-steroidal anti-inflammatories (NSAID): Secondary | ICD-10-CM | POA: Insufficient documentation

## 2021-02-11 DIAGNOSIS — Z885 Allergy status to narcotic agent status: Secondary | ICD-10-CM | POA: Diagnosis not present

## 2021-02-11 DIAGNOSIS — Z888 Allergy status to other drugs, medicaments and biological substances status: Secondary | ICD-10-CM | POA: Insufficient documentation

## 2021-02-11 HISTORY — DX: Fatty (change of) liver, not elsewhere classified: K76.0

## 2021-02-11 HISTORY — DX: Presence of dental prosthetic device (complete) (partial): Z97.2

## 2021-02-11 HISTORY — DX: Presence of other specified functional implants: Z96.89

## 2021-02-11 HISTORY — DX: Inflammatory liver disease, unspecified: K75.9

## 2021-02-11 HISTORY — PX: CATARACT EXTRACTION W/PHACO: SHX586

## 2021-02-11 SURGERY — PHACOEMULSIFICATION, CATARACT, WITH IOL INSERTION
Anesthesia: Monitor Anesthesia Care | Site: Eye | Laterality: Right

## 2021-02-11 MED ORDER — SIGHTPATH DOSE#1 SODIUM HYALURONATE 23 MG/ML IO SOLUTION
PREFILLED_SYRINGE | INTRAOCULAR | Status: DC | PRN
  Administered 2021-02-11: 0.55 mL via INTRAOCULAR

## 2021-02-11 MED ORDER — SIGHTPATH DOSE#1 BSS IO SOLN
INTRAOCULAR | Status: DC | PRN
  Administered 2021-02-11: 57 mL via OPHTHALMIC

## 2021-02-11 MED ORDER — PHENYLEPHRINE HCL 10 % OP SOLN
1.0000 [drp] | OPHTHALMIC | Status: DC | PRN
  Administered 2021-02-11 (×3): 1 [drp] via OPHTHALMIC

## 2021-02-11 MED ORDER — FENTANYL CITRATE (PF) 100 MCG/2ML IJ SOLN
INTRAMUSCULAR | Status: DC | PRN
  Administered 2021-02-11: 50 ug via INTRAVENOUS

## 2021-02-11 MED ORDER — MOXIFLOXACIN HCL 0.5 % OP SOLN
OPHTHALMIC | Status: DC | PRN
  Administered 2021-02-11: 0.2 mL via OPHTHALMIC

## 2021-02-11 MED ORDER — SIGHTPATH DOSE#1 SODIUM HYALURONATE 10 MG/ML IO SOLUTION
PREFILLED_SYRINGE | INTRAOCULAR | Status: DC | PRN
  Administered 2021-02-11: 0.85 mL via INTRAOCULAR

## 2021-02-11 MED ORDER — LACTATED RINGERS IV SOLN: INTRAVENOUS | Status: DC

## 2021-02-11 MED ORDER — LIDOCAINE HCL (PF) 2 % IJ SOLN
INTRAOCULAR | Status: DC | PRN
  Administered 2021-02-11: 1 mL via INTRAOCULAR

## 2021-02-11 MED ORDER — CYCLOPENTOLATE HCL 2 % OP SOLN
1.0000 [drp] | OPHTHALMIC | Status: DC | PRN
  Administered 2021-02-11 (×3): 1 [drp] via OPHTHALMIC

## 2021-02-11 MED ORDER — TETRACAINE HCL 0.5 % OP SOLN
1.0000 [drp] | OPHTHALMIC | Status: DC | PRN
  Administered 2021-02-11 (×3): 1 [drp] via OPHTHALMIC

## 2021-02-11 MED ORDER — SIGHTPATH DOSE#1 BSS IO SOLN
INTRAOCULAR | Status: DC | PRN
  Administered 2021-02-11: 15 mL

## 2021-02-11 MED ORDER — MIDAZOLAM HCL 2 MG/2ML IJ SOLN
INTRAMUSCULAR | Status: DC | PRN
  Administered 2021-02-11: 2 mg via INTRAVENOUS

## 2021-02-11 SURGICAL SUPPLY — 18 items
CANNULA ANT/CHMB 27G (MISCELLANEOUS) ×2 IMPLANT
CANNULA ANT/CHMB 27GA (MISCELLANEOUS) ×4 IMPLANT
DISSECTOR HYDRO NUCLEUS 50X22 (MISCELLANEOUS) ×2 IMPLANT
GLOVE SURG ENC TEXT LTX SZ7.5 (GLOVE) ×2 IMPLANT
GLOVE SURG GAMMEX PI TX LF 7.5 (GLOVE) ×1 IMPLANT
GLOVE SURG SYN 8.5  E (GLOVE) ×2
GLOVE SURG SYN 8.5 E (GLOVE) ×1 IMPLANT
GLOVE SURG SYN 8.5 PF PI (GLOVE) ×1 IMPLANT
GOWN STRL REUS W/ TWL LRG LVL3 (GOWN DISPOSABLE) ×2 IMPLANT
GOWN STRL REUS W/TWL LRG LVL3 (GOWN DISPOSABLE) ×4
LENS ACRYSOF TORIC TRIFOC 20.5 ×2 IMPLANT
LENS IOL ACRSF IQ PAN 20.5 IMPLANT
MARKER SKIN DUAL TIP RULER LAB (MISCELLANEOUS) ×2 IMPLANT
PACK EYE AFTER SURG (MISCELLANEOUS) ×2 IMPLANT
SYR 3ML LL SCALE MARK (SYRINGE) ×2 IMPLANT
SYR TB 1ML LUER SLIP (SYRINGE) ×2 IMPLANT
WATER STERILE IRR 250ML POUR (IV SOLUTION) ×2 IMPLANT
WIPE NON LINTING 3.25X3.25 (MISCELLANEOUS) ×2 IMPLANT

## 2021-02-11 NOTE — H&P (Signed)
[facility]   Primary Care Physician:  Birdie Sons, MD Ophthalmologist: Dr. Benay Pillow  Pre-Procedure History & Physical: HPI:  Courtney Ross is a 65 y.o. female here for cataract surgery.   Past Medical History:  Diagnosis Date   Anxiety    Back pain    Colon polyp    Constipation    Degenerative disc disease, lumbar    Depression    Family history of adverse reaction to anesthesia    daughter nausea   Fatty liver    Fatty liver disease, nonalcoholic    GERD (gastroesophageal reflux disease)    H/O spinal fusion    Headache    migraines   Headache, migraine 05/08/2015   Hepatitis    in the 1970s.  Resolved.   High cholesterol    Hypertension    Lower back pain    Polycystic ovarian disease    PONV (postoperative nausea and vomiting)    due to surgery from the 1990's   Restless leg syndrome    Spasm 01/29/2009   Spinal cord stimulator status    Wears dentures    full upper, partial lower    Past Surgical History:  Procedure Laterality Date   APPENDECTOMY  1988   ARTHRODESIS METATARSALPHALANGEAL JOINT (MTPJ) Left 12/14/2014   Procedure: ARTHRODESIS METATARSALPHALANGEAL JOINT (MTPJ) 1ST;  Surgeon: Albertine Patricia, DPM;  Location: [place];  Service: Podiatry;  Laterality: Left;  LMA   back fusion  2004   lumbar region. had broken facet joint.    BACK SURGERY  [phone]   x3   BLADDER SUSPENSION  2006   BREAST BIOPSY Left 1990's   core Sankar   BREAST SURGERY  2005   lumpectomy   CARPAL TUNNEL RELEASE Bilateral 02/26/07,05/21/07   CHOLECYSTECTOMY N/A 05/03/2018   Procedure: LAPAROSCOPIC CHOLECYSTECTOMY WITH INTRAOPERATIVE CHOLANGIOGRAM;  Surgeon: Robert Bellow, MD;  Location: [facility];  Service: General;  Laterality: N/A;   COLONOSCOPY     COLONOSCOPY WITH PROPOFOL N/A 01/10/2020   Procedure: COLONOSCOPY WITH PROPOFOL;  Surgeon: Jonathon Bellows, MD;  Location: [facility];  Service: Gastroenterology;  Laterality: N/A;    COLPORRHAPHY     DILATION AND CURETTAGE OF UTERUS     ECTOPIC PREGNANCY SURGERY  1982   FOOT SURGERY Left 2017   fusion on big toe, hammer toe, bunions   HAMMER TOE SURGERY Left 03/22/2015   Procedure: HAMMER TOE CORRECTION L 2ND AND 3RD ;  Surgeon: Albertine Patricia, DPM;  Location: [place];  Service: Podiatry;  Laterality: Left;  WITH LOCAL   HERNIA REPAIR Right 1988   inguinal   LAMINECTOMY  1993, 1994   NECK SURGERY  2006   fusion   RECTOCELE REPAIR N/A 08/25/2016   Procedure: POSTERIOR REPAIR (RECTOCELE);  Surgeon: Brayton Mars, MD;  Location: [facility];  Service: Gynecology;  Laterality: N/A;   SPINAL CORD STIMULATOR BATTERY EXCHANGE Right 04/08/2019   Procedure: SPINAL CORD STIMULATOR BATTERY EXCHANGE;  Surgeon: Clydell Hakim, MD;  Location: [place];  Service: Neurosurgery;  Laterality: Right;  SPINAL CORD STIMULATOR BATTERY EXCHANGE   SPINAL CORD STIMULATOR IMPLANT  2014   stimulator in the ileo inguinal nerve due to scar tissue in abdomen   TONSILLECTOMY  1985   [place]OTAL [place]ST[place]CT[place]   VENTRAL HERNIA REPAIR N/A 09/01/2018   6.4 cm Ventra lite mesh. HERNIA REPAIR VENTRAL ADULT;  Surgeon: Robert Bellow, MD;  Location: [facility];  Service: General;  Laterality: N/A;    Prior to Admission medications   Medication Sig Start Date End Date Taking? Authorizing Provider  aspirin EC 81 MG tablet Take 81 mg by mouth at bedtime.   Yes [provider]  buPROPion (WELLBUTRIN SR) 150 MG 12 hr tablet TAKE 1 TABLET BY MOUTH 3 TIMES DAILY. 02/08/21  Yes Birdie Sons, MD  Cholecalciferol (VITAMIN D3) 5000 UNITS CAPS Take 5,000 Units by mouth daily.    Yes [provider]  Coenzyme Q10 200 MG capsule Take 200 mg by mouth daily.   Yes [provider]  cyclobenzaprine (FLEXERIL) 10 MG tablet Take 1 tablet (10 mg total) by mouth 3 (three) times daily as needed for muscle spasms. 08/24/20  Yes Birdie Sons, MD  diazepam (VALIUM) 10 MG  tablet TAKE ONE TABLET EVERY 6 HOURS AS NEEDED FOR ANXIETY 08/24/20  Yes Birdie Sons, MD  diclofenac Sodium (VOLTAREN) 1 % GEL SMARTSIG:1 Gram(s) Topical 4 Times Daily PRN 06/13/19  Yes [provider]  estradiol (ESTRACE) 1 MG tablet TAKE ONE TABLET EVERY DAY 01/21/21  Yes Birdie Sons, MD  fluticasone (FLONASE) 50 MCG/ACT nasal spray Place 1 spray into both nostrils daily as needed for allergies or rhinitis.   Yes [provider]  hydroquinone 4 % cream Apply 1 application topically daily. 11/11/19  Yes Rubie Maid, MD  LINZESS 145 MCG CAPS capsule TAKE 1 CAPSULE EVERY DAY 04/10/20  Yes Birdie Sons, MD  meloxicam (MOBIC) 15 MG tablet Take 15 mg by mouth at bedtime.  05/11/15  Yes [provider]  Multiple Vitamin (MULTIVITAMIN WITH MINERALS) TABS tablet Take 1 tablet by mouth daily.   Yes [provider]  mupirocin cream (BACTROBAN) 2 % APPLY TO AFFECTED AREAS TOPICALLY TWICE DAILY 07/07/19  Yes Birdie Sons, MD  MYRBETRIQ 25 MG TB24 tablet TAKE 1 TABLET BY MOUTH DAILY. 01/07/21  Yes Birdie Sons, MD  omeprazole (PRILOSEC) 40 MG capsule Take 1 capsule (40 mg total) by mouth daily. Patient taking differently: Take 40 mg by mouth daily as needed (heartburn). 03/05/18  Yes Vickie Epley, MD  oxyCODONE (ROXICODONE) 15 MG immediate release tablet TAKE 1-2 TABLETS BY MOUTH EVERY 4 HOURS FOR PAIN. 01/22/21  Yes Birdie Sons, MD  polyethylene glycol (MIRALAX / GLYCOLAX) packet Take 17 g by mouth 2 (two) times daily as needed for mild constipation (dissolve in an 8 oz glass of water). 07/21/18  Yes Birdie Sons, MD  pregabalin (LYRICA) 75 MG capsule TAKE 1 CAPSULE BY MOUTH 3 TIMES DAILY Patient taking differently: Take 150 mg by mouth 2 (two) times daily. 01/06/20  Yes Birdie Sons, MD  RESTASIS 0.05 % ophthalmic emulsion Place 1 drop into both eyes 2 (two) times daily.  11/21/15  Yes [provider]  rOPINIRole (REQUIP) 2 MG tablet  TAKE 1 TABLET BY MOUTH AT BEDTIME 10/05/20  Yes Birdie Sons, MD  rosuvastatin (CRESTOR) 10 MG tablet TAKE ONE TABLET EVERY DAY 11/06/20  Yes Birdie Sons, MD  Turmeric 500 MG CAPS Take 500 mg by mouth daily.  05/05/19  Yes [provider]  phentermine 37.5 MG capsule Take 1 capsule (37.5 mg total) by mouth every morning. Patient not taking: Reported on 02/04/2021 11/09/19   Rubie Maid, MD    Allergies as of 01/11/2021 - Review Complete 07/09/2020  Allergen Reaction Noted   Hydrocodone Itching 12/05/2014   Lisinopril Cough 10/29/2016   Mirtazapine  05/29/2015   Other  Nausea And Vomiting 04/28/2018    Family History  Problem Relation Age of Onset   Diabetes Father    Hypertension Father    CAD Father    Diabetes Sister        non-Insulin dependent diabetes mellitus   Heart disease Brother    Cancer Daughter        cancer of the cervix at age 68-18   Heart disease Brother    Breast cancer Paternal Aunt    Colon cancer Paternal Uncle    Ovarian cancer Neg Hx     Social History   Socioeconomic History   Marital status: Legally Separated    Spouse name: Not on file   Number of children: 4   Years of education: Not on file   Highest education level: 9th grade  Occupational History    Comment: disabled  Tobacco Use   Smoking status: Never   Smokeless tobacco: Never  Vaping Use   Vaping Use: Never used  Substance and Sexual Activity   Alcohol use: Yes    Comment: rare   Drug use: No   Sexual activity: Yes    Birth control/protection: Surgical  Other Topics Concern   Not on file  Social History Narrative   Pt is currently receiving assistance from [employer] and gets food stamps.    Social Determinants of Health   Financial Resource Strain: Low Risk    Difficulty of Paying Living Expenses: Not hard at all  Food Insecurity: No Food Insecurity   Worried About Charity fundraiser in the Last Year: Never true   [place] in the Last Year: Never true   Transportation Needs: No Transportation Needs   Lack of Transportation (Medical): No   Lack of Transportation (Non-Medical): No  Physical Activity: Insufficiently Active   Days of Exercise per Week: 3 days   Minutes of Exercise per Session: 30 min  Stress: No Stress Concern Present   Feeling of Stress : Not at all  Social Connections: Unknown   Frequency of Communication with Friends and Family: More than three times a week   Frequency of Social Gatherings with Friends and Family: More than three times a week   Attends Religious Services: More than 4 times per year   Active Member of [employer] or Organizations: Yes   Attends Music therapist: More than 4 times per year   Marital Status: Not on file  Intimate Partner Violence: Not At Risk   Fear of Current or Ex-Partner: No   Emotionally Abused: No   Physically Abused: No   Sexually Abused: No    Review of Systems: See HPI, otherwise negative ROS  Physical Exam: BP 136/85   Pulse 88   Temp 97.9 F (36.6 C) (Temporal)   Resp 16   Ht '5\' 8"'$  (1.727 m)   Wt 88.5 kg   SpO2 98%   BMI 29.65 kg/m  General:   Alert, cooperative in NAD Head:  Normocephalic and atraumatic. Respiratory:  Normal work of breathing. Cardiovascular:  RRR  Impression/Plan: Courtney Ross is here for cataract surgery.  Risks, benefits, limitations, and alternatives regarding cataract surgery have been reviewed with the patient.  Questions have been answered.  All parties agreeable.   Benay Pillow, MD  02/11/2021, 9:34 AM

## 2021-02-11 NOTE — Transfer of Care (Signed)
Immediate Anesthesia Transfer of Care Note  Patient: Courtney Ross  Procedure(s) Performed: CATARACT EXTRACTION PHACO AND INTRAOCULAR LENS PLACEMENT ([place]) RIGHT PANOPTIX LENS (Right: Eye)  Patient Location: PACU  Anesthesia Type: MAC  Level of Consciousness: awake, alert  and patient cooperative  Airway and Oxygen Therapy: Patient Spontanous Breathing and Patient connected to supplemental oxygen  Post-op Assessment: Post-op Vital signs reviewed, Patient's Cardiovascular Status Stable, Respiratory Function Stable, Patent Airway and No signs of Nausea or vomiting  Post-op Vital Signs: Reviewed and stable  Complications: No notable events documented.

## 2021-02-11 NOTE — Anesthesia Preprocedure Evaluation (Signed)
Anesthesia Evaluation  Patient identified by MRN, date of birth, ID band  History of Anesthesia Complications (+) PONV  Airway Mallampati: II   Neck ROM: Full    Dental   Pulmonary    Pulmonary exam normal        Cardiovascular hypertension, Normal cardiovascular exam     Neuro/Psych  Headaches, Anxiety Depression    GI/Hepatic GERD  ,(+) Hepatitis -  Endo/Other  Hypothyroidism   Renal/GU      Musculoskeletal  (+) Arthritis ,   Abdominal   Peds  Hematology   Anesthesia Other Findings   Reproductive/Obstetrics                             Anesthesia Physical Anesthesia Plan  ASA: 3  Anesthesia Plan: MAC   Post-op Pain Management:    Induction:   PONV Risk Score and Plan:   Airway Management Planned: Natural Airway  Additional Equipment:   Intra-op Plan:   Post-operative Plan:   Informed Consent: I have reviewed the patients History and Physical, chart, labs and discussed the procedure including the risks, benefits and alternatives for the proposed anesthesia with the patient or authorized representative who has indicated his/her understanding and acceptance.       Plan Discussed with:   Anesthesia Plan Comments:         Anesthesia Quick Evaluation

## 2021-02-11 NOTE — Op Note (Signed)
OPERATIVE NOTE  Courtney Ross [id number] 02/11/2021   PREOPERATIVE DIAGNOSIS:  Nuclear sclerotic cataract right eye.  H25.11   POSTOPERATIVE DIAGNOSIS:    Nuclear sclerotic cataract right eye.     PROCEDURE:  Phacoemusification with posterior chamber intraocular lens placement of the right eye   LENS:   Implant Name Type Inv. Item Serial No. Manufacturer Lot No. LRB No. Used Action  ACRYSOF TORIC TRIFOCAL 20.5 - [id number]  ACRYSOF TORIC TRIFOCAL 20.5 [id number] ALCON  Right 1 Implanted       Procedure(s) with comments: CATARACT EXTRACTION PHACO AND INTRAOCULAR LENS PLACEMENT ([facility]) RIGHT PANOPTIX LENS (Right) - PANOPTIX LENS 1.92 00:17.8  TFNT00 +20.5   ULTRASOUND TIME: 0 minutes 17 seconds.  CDE 1.92   SURGEON:  Benay Pillow, MD, MPH  ANESTHESIOLOGIST: Anesthesiologist: Carlos American, MD CRNA: Silvana Newness, CRNA   ANESTHESIA:  Topical with tetracaine drops augmented with 1% preservative-free intracameral lidocaine.  ESTIMATED BLOOD LOSS: less than 1 mL.   COMPLICATIONS:  None.   DESCRIPTION OF PROCEDURE:  The patient was identified in the holding room and transported to the operating room and placed in the supine position under the operating microscope.  The right eye was identified as the operative eye and it was prepped and draped in the usual sterile ophthalmic fashion.   A 1.0 millimeter clear-corneal paracentesis was made at the 10:30 position. 0.5 ml of preservative-free 1% lidocaine with epinephrine was injected into the anterior chamber.  The anterior chamber was filled with Healon 5 viscoelastic.  A 2.4 millimeter keratome was used to make a near-clear corneal incision at the 8:00 position.  A curvilinear capsulorrhexis was made with a cystotome and capsulorrhexis forceps.  Balanced salt solution was used to hydrodissect and hydrodelineate the nucleus.   Phacoemulsification was then used in stop and chop fashion to remove the lens nucleus and epinucleus.   The remaining cortex was then removed using the irrigation and aspiration handpiece. Healon was then placed into the capsular bag to distend it for lens placement.  A lens was then injected into the capsular bag.  The remaining viscoelastic was aspirated.   Wounds were hydrated with balanced salt solution.  The anterior chamber was inflated to a physiologic pressure with balanced salt solution.   Intracameral vigamox 0.1 mL undiluted was injected into the eye and a drop placed onto the ocular surface.  The lens was well centered and clear.  No wound leaks were noted.  The patient was taken to the recovery room in stable condition without complications of anesthesia or surgery  Benay Pillow 02/11/2021, 10:05 AM

## 2021-02-11 NOTE — Anesthesia Postprocedure Evaluation (Signed)
Anesthesia Post Note  Patient: Courtney Ross  Procedure(s) Performed: CATARACT EXTRACTION PHACO AND INTRAOCULAR LENS PLACEMENT ([place]) RIGHT PANOPTIX LENS (Right: Eye)     Patient location during evaluation: PACU Anesthesia Type: MAC Level of consciousness: awake and alert Pain management: pain level controlled Vital Signs Assessment: post-procedure vital signs reviewed and stable Respiratory status: spontaneous breathing, nonlabored ventilation, respiratory function stable and patient connected to nasal cannula oxygen Cardiovascular status: stable and blood pressure returned to baseline Postop Assessment: no apparent nausea or vomiting Anesthetic complications: no   No notable events documented.  Wanda Plump Quamesha Mullet

## 2021-02-12 ENCOUNTER — Other Ambulatory Visit: Payer: Self-pay

## 2021-02-12 ENCOUNTER — Encounter: Payer: Self-pay | Admitting: Ophthalmology

## 2021-02-18 ENCOUNTER — Encounter: Payer: Self-pay | Admitting: Family Medicine

## 2021-02-18 ENCOUNTER — Telehealth: Payer: Self-pay | Admitting: Family Medicine

## 2021-02-18 ENCOUNTER — Telehealth (INDEPENDENT_AMBULATORY_CARE_PROVIDER_SITE_OTHER): Payer: 59 | Admitting: Family Medicine

## 2021-02-18 DIAGNOSIS — E7800 Pure hypercholesterolemia, unspecified: Secondary | ICD-10-CM

## 2021-02-18 DIAGNOSIS — G2581 Restless legs syndrome: Secondary | ICD-10-CM | POA: Diagnosis not present

## 2021-02-18 DIAGNOSIS — K219 Gastro-esophageal reflux disease without esophagitis: Secondary | ICD-10-CM

## 2021-02-18 MED ORDER — OMEPRAZOLE 40 MG PO CPDR: 40.0000 mg | DELAYED_RELEASE_CAPSULE | Freq: Every day | ORAL | 4 refills | Status: DC | PRN

## 2021-02-18 MED ORDER — ROSUVASTATIN CALCIUM 10 MG PO TABS: 10.0000 mg | ORAL_TABLET | Freq: Every day | ORAL | 1 refills | Status: DC

## 2021-02-18 MED ORDER — PRAMIPEXOLE DIHYDROCHLORIDE 0.25 MG PO TABS
ORAL_TABLET | ORAL | 1 refills | Status: DC
Start: 2021-02-18 — End: 2021-04-06

## 2021-02-18 NOTE — Telephone Encounter (Signed)
[facility] faxed refill request for the following medications:    rosuvastatin (CRESTOR) 10 MG tablet   Please advise.

## 2021-02-18 NOTE — Progress Notes (Signed)
MyChart Video Visit    Virtual Visit via Video Note   This visit type was conducted due to national recommendations for restrictions regarding the COVID-19 Pandemic (e.g. social distancing) in an effort to limit this patient's exposure and mitigate transmission in our community. This patient is at least at moderate risk for complications without adequate follow up. This format is felt to be most appropriate for this patient at this time. Physical exam was limited by quality of the video and audio technology used for the visit.   Patient location: home Provider location: [facility]  I discussed the limitations of evaluation and management by telemedicine and the availability of in person appointments. The patient expressed understanding and agreed to proceed.  Patient: Courtney Ross   DOB: January 26, 1956   65 y.o. Female  MRN: [id number] Visit Date: 02/18/2021  Today's healthcare provider: Lelon Huh, MD   Chief Complaint  Patient presents with   Restless Legs syndrome   Gastroesophageal Reflux   Subjective    HPI  Follow up for restless legs syndrome:  The patient was last seen for this 2 years ago. Changes made at last visit include none.  She reports good compliance with treatment. Currently on Ropinirole at bedtime. She feels that condition is Worse. She states that the current dose is no longer effective in controlling symptoms. She feels sleepy after taking the medication and yawns most of the day.  She is not having side effects.   -----------------------------------------------------------------------------------------   Depression, Follow-up  She  was last seen for this 1  year  ago. Changes made at last visit include none.   She reports good compliance with treatment. She is not having side effects.   She reports good tolerance of treatment. Current symptoms include: depressed mood She feels she is Unchanged since last visit.  Depression screen [facility] 2/9  02/18/2021 07/09/2020 01/23/2020  Decreased Interest 0 0 0  Down, Depressed, Hopeless 1 0 0  PHQ - 2 Score 1 0 0  Altered sleeping 1 - 3  Tired, decreased energy 1 - 1  Change in appetite 0 - 0  Feeling bad or failure about yourself  0 - 0  Trouble concentrating 0 - 0  Moving slowly or fidgety/restless 0 - 0  Suicidal thoughts 0 - 0  PHQ-9 Score 3 - 4  Difficult doing work/chores Not difficult at all - Somewhat difficult  Some recent data might be hidden    -----------------------------------------------------------------------------------------   GERD, Follow up:  Patient currently takes Omeprazole '40mg'$  daily (prescribed by [facility]).  She reports good compliance with treatment. She is not having side effects. .  She IS experiencing heartburn. She reports that she is having more indigestion and reflux on current dose of Omeprazole.   -----------------------------------------------------------------------------------------     Medications: Outpatient Medications Prior to Visit  Medication Sig   aspirin EC 81 MG tablet Take 81 mg by mouth at bedtime.   buPROPion (WELLBUTRIN SR) 150 MG 12 hr tablet TAKE 1 TABLET BY MOUTH 3 TIMES DAILY.   Cholecalciferol (VITAMIN D3) 5000 UNITS CAPS Take 5,000 Units by mouth daily.    Coenzyme Q10 200 MG capsule Take 200 mg by mouth daily.   cyclobenzaprine (FLEXERIL) 10 MG tablet Take 1 tablet (10 mg total) by mouth 3 (three) times daily as needed for muscle spasms.   diazepam (VALIUM) 10 MG tablet TAKE ONE TABLET EVERY 6 HOURS AS NEEDED FOR ANXIETY   diclofenac Sodium (VOLTAREN) 1 % GEL SMARTSIG:1 Gram(s)  Topical 4 Times Daily PRN   estradiol (ESTRACE) 1 MG tablet TAKE ONE TABLET EVERY DAY   fluticasone (FLONASE) 50 MCG/ACT nasal spray Place 1 spray into both nostrils daily as needed for allergies or rhinitis.   hydroquinone 4 % cream Apply 1 application topically daily.   LINZESS 145 MCG CAPS capsule TAKE 1 CAPSULE EVERY DAY    meloxicam (MOBIC) 15 MG tablet Take 15 mg by mouth at bedtime.    Multiple Vitamin (MULTIVITAMIN WITH MINERALS) TABS tablet Take 1 tablet by mouth daily.   mupirocin cream (BACTROBAN) 2 % APPLY TO AFFECTED AREAS TOPICALLY TWICE DAILY   MYRBETRIQ 25 MG TB24 tablet TAKE 1 TABLET BY MOUTH DAILY.   omeprazole (PRILOSEC) 40 MG capsule Take 1 capsule (40 mg total) by mouth daily. (Patient taking differently: Take 40 mg by mouth daily as needed (heartburn).)   oxyCODONE (ROXICODONE) 15 MG immediate release tablet TAKE 1-2 TABLETS BY MOUTH EVERY 4 HOURS FOR PAIN.   polyethylene glycol (MIRALAX / GLYCOLAX) packet Take 17 g by mouth 2 (two) times daily as needed for mild constipation (dissolve in an 8 oz glass of water).   pregabalin (LYRICA) 75 MG capsule TAKE 1 CAPSULE BY MOUTH 3 TIMES DAILY (Patient taking differently: Take 150 mg by mouth 2 (two) times daily.)   RESTASIS 0.05 % ophthalmic emulsion Place 1 drop into both eyes 2 (two) times daily.    rOPINIRole (REQUIP) 2 MG tablet TAKE 1 TABLET BY MOUTH AT BEDTIME   rosuvastatin (CRESTOR) 10 MG tablet TAKE ONE TABLET EVERY DAY   Turmeric 500 MG CAPS Take 500 mg by mouth daily.    phentermine 37.5 MG capsule Take 1 capsule (37.5 mg total) by mouth every morning. (Patient not taking: No sig reported)   No facility-administered medications prior to visit.    Review of Systems    Objective    There were no vitals taken for this visit.   Physical Exam   Awake, alert, oriented x 3. In no apparent distress   Assessment & Plan     1. Restless legs syndrome No longer controlled with ropinirole, change to  pramipexole (MIRAPEX) 0.25 MG tablet; Start 1/2 tablet 1 hour before bedtime for 4 days, then increase to 1 tablet before bedtime for 4 days, then 2 tablets before bedtime  Dispense: 90 tablet; Refill: 1  2. Gastroesophageal reflux disease, unspecified whether esophagitis present Refill - omeprazole (PRILOSEC) 40 MG capsule; Take 1 capsule (40  mg total) by mouth daily as needed (heartburn).  Dispense: 90 capsule; Refill: 4       I discussed the assessment and treatment plan with the patient. The patient was provided an opportunity to ask questions and all were answered. The patient agreed with the plan and demonstrated an understanding of the instructions.   The patient was advised to call back or seek an in-person evaluation if the symptoms worsen or if the condition fails to improve as anticipated.  I provided 11 minutes of non-face-to-face time during this encounter.  The entirety of the information documented in the History of Present Illness, Review of Systems and Physical Exam were personally obtained by me. Portions of this information were initially documented by the CMA and reviewed by me for thoroughness and accuracy.    Lelon Huh, MD [facility] [phone] (phone) [phone] (fax)  [place]

## 2021-02-19 ENCOUNTER — Telehealth: Payer: Self-pay

## 2021-02-19 NOTE — Telephone Encounter (Signed)
Copied from [place] [id number]. Topic: Quick Communication - Rx Refill/Question >> Feb 19, 2021 12:14 PM [place], Apolonio Schneiders wrote: Pharmacy was following up on a fax they sent over, caller needed some clarifaciton on the medications that were recently sent in as well.

## 2021-02-19 NOTE — Telephone Encounter (Signed)
Error

## 2021-02-19 NOTE — Telephone Encounter (Signed)
LOV:  NOV: None  Last Refill:   pregabalin 01/06/2020 #90 1 Refill

## 2021-02-19 NOTE — Addendum Note (Signed)
Addended by: Ashley Royalty E on: 02/19/2021 10:32 AM   Modules accepted: Orders

## 2021-02-19 NOTE — Telephone Encounter (Signed)
[facility] faxed refill request for the following medications:    pregabalin (LYRICA) 75 MG capsule  Please advise.

## 2021-02-21 ENCOUNTER — Other Ambulatory Visit: Payer: Self-pay | Admitting: Family Medicine

## 2021-02-21 ENCOUNTER — Telehealth: Payer: Self-pay | Admitting: Family Medicine

## 2021-02-21 MED ORDER — MELOXICAM 15 MG PO TABS: 15.0000 mg | ORAL_TABLET | Freq: Every evening | ORAL | 3 refills | Status: DC | PRN

## 2021-02-21 MED ORDER — OXYCODONE HCL 15 MG PO TABS: ORAL_TABLET | ORAL | 0 refills | Status: DC

## 2021-02-21 NOTE — Telephone Encounter (Signed)
Copied from [place] [phone]. Topic: Quick Communication - Rx Refill/Question >> Feb 21, 2021  8:44 AM Tessa Lerner A wrote: Medication: meloxicam (MOBIC) 15 MG tablet [id number]   Has the patient contacted their pharmacy? Yes.   (Agent: If no, request that the patient contact the pharmacy for the refill.) (Agent: If yes, when and what did the pharmacy advise?)  Preferred Pharmacy (with phone number or street name): [place] ([facility]) - [place], [address] [place] [address] Phone: [phone] Fax: [phone]  Agent: Please be advised that RX refills may take up to 3 business days. We ask that you follow-up with your pharmacy.

## 2021-02-21 NOTE — Telephone Encounter (Signed)
Copied from [place] [phone]. Topic: Quick Communication - Rx Refill/Question >> Feb 21, 2021 12:22 PM Yvette Rack wrote: Medication: meloxicam (MOBIC) 15 MG tablet  Has the patient contacted their pharmacy? Yes.   (Agent: If no, request that the patient contact the pharmacy for the refill.) (Agent: If yes, when and what did the pharmacy advise?)  Preferred Pharmacy (with phone number or street name): [place] ([facility]) - [place], [place] Phone: [phone]  Fax: [phone]  Agent: Please be advised that RX refills may take up to 3 business days. We ask that you follow-up with your pharmacy.

## 2021-02-21 NOTE — Telephone Encounter (Signed)
Kathlee Nations pharmacist with [facility] needs to know which medication the pt should be taking ropinirole or pramipexole reference number [id number]

## 2021-02-21 NOTE — Telephone Encounter (Signed)
Already refilled 02/21/21.

## 2021-02-21 NOTE — Telephone Encounter (Signed)
Requested medication (s) are due for refill today:yes  Requested medication (s) are on the active medication list: yes  Last refill: 01/22/21  #180  0 refills  Future visit scheduled yes 07/15/21  with nurse  Notes to clinic: not delegated  Requested Prescriptions  Pending Prescriptions Disp Refills   oxyCODONE (ROXICODONE) 15 MG immediate release tablet 180 tablet 0    Sig: TAKE 1-2 TABLETS BY MOUTH EVERY 4 HOURS FOR PAIN.     Not Delegated - Analgesics:  Opioid Agonists Failed - 02/21/2021 11:04 AM      Failed - This refill cannot be delegated      Failed - Urine Drug Screen completed in last 360 days      Passed - Valid encounter within last 6 months    Recent Outpatient Visits           3 days ago Restless legs syndrome   [facility] Birdie Sons, MD   6 months ago Essential hypertension   [facility] Birdie Sons, MD   1 year ago Vitamin D deficiency   [facility] Birdie Sons, MD   2 years ago Annual physical exam   [facility] Birdie Sons, MD   3 years ago Elevated blood pressure reading   [facility] Birdie Sons, MD

## 2021-02-21 NOTE — Telephone Encounter (Signed)
Copied from [place] [phone]. Topic: Quick Communication - Rx Refill/Question >> Feb 21, 2021  9:07 AM Leward Quan A wrote: Medication: oxyCODONE (ROXICODONE) 15 MG immediate release tablet  Has the patient contacted their pharmacy? No. (Agent: If no, request that the patient contact the pharmacy for the refill.) (Agent: If yes, when and what did the pharmacy advise?)  Preferred Pharmacy (with phone number or street name): [place], [place] - [place]  Phone:  [phone] Fax:  [phone]     Agent: Please be advised that RX refills may take up to 3 business days. We ask that you follow-up with your pharmacy.

## 2021-02-22 NOTE — Telephone Encounter (Signed)
Pharmacy advised  

## 2021-02-22 NOTE — Telephone Encounter (Signed)
She was changed to pramipexole

## 2021-03-01 NOTE — Anesthesia Preprocedure Evaluation (Addendum)
Anesthesia Evaluation  Patient identified by MRN, date of birth, ID band Patient awake    Reviewed: Allergy & Precautions, NPO status , Patient's Chart, lab work & pertinent test results, reviewed documented beta blocker date and time   History of Anesthesia Complications (+) PONV and history of anesthetic complications  Airway Mallampati: II  TM Distance: >3 FB Neck ROM: Full    Dental  (+) Upper Dentures, Partial Lower   Pulmonary    breath sounds clear to auscultation       Cardiovascular hypertension, (-) angina(-) DOE  Rhythm:Regular Rate:Normal   HLD   Neuro/Psych  Headaches, PSYCHIATRIC DISORDERS Anxiety Depression  S/p spinal cord stimulator  Neuromuscular disease    GI/Hepatic GERD  Controlled,(+) Hepatitis - (Fatty liver)  Endo/Other  Hypothyroidism  PCOS  Renal/GU      Musculoskeletal  (+) Arthritis ,   Abdominal   Peds  Hematology   Anesthesia Other Findings   Reproductive/Obstetrics                            Anesthesia Physical Anesthesia Plan  ASA: 2  Anesthesia Plan: MAC   Post-op Pain Management:    Induction: Intravenous  PONV Risk Score and Plan: 3 and TIVA, Midazolam and Treatment may vary due to age or medical condition  Airway Management Planned: Nasal Cannula  Additional Equipment:   Intra-op Plan:   Post-operative Plan:   Informed Consent: I have reviewed the patients History and Physical, chart, labs and discussed the procedure including the risks, benefits and alternatives for the proposed anesthesia with the patient or authorized representative who has indicated his/her understanding and acceptance.       Plan Discussed with: CRNA and Anesthesiologist  Anesthesia Plan Comments:         Anesthesia Quick Evaluation

## 2021-03-01 NOTE — Discharge Instructions (Signed)

## 2021-03-04 ENCOUNTER — Ambulatory Visit
Admission: RE | Admit: 2021-03-04 | Discharge: 2021-03-04 | Disposition: A | Discharge status: Home / Self Care | Payer: 59 | Attending: Ophthalmology | Admitting: Ophthalmology

## 2021-03-04 ENCOUNTER — Ambulatory Visit: Payer: 59 | Admitting: Anesthesiology

## 2021-03-04 ENCOUNTER — Encounter: Payer: Self-pay | Admitting: Ophthalmology

## 2021-03-04 ENCOUNTER — Encounter
Admission: RE | Disposition: A | Discharge status: Home / Self Care | Payer: Self-pay | Source: Home / Self Care | Attending: Ophthalmology

## 2021-03-04 ENCOUNTER — Other Ambulatory Visit: Payer: Self-pay

## 2021-03-04 DIAGNOSIS — Z833 Family history of diabetes mellitus: Secondary | ICD-10-CM | POA: Insufficient documentation

## 2021-03-04 DIAGNOSIS — Z9049 Acquired absence of other specified parts of digestive tract: Secondary | ICD-10-CM | POA: Diagnosis not present

## 2021-03-04 DIAGNOSIS — Z885 Allergy status to narcotic agent status: Secondary | ICD-10-CM | POA: Insufficient documentation

## 2021-03-04 DIAGNOSIS — Z888 Allergy status to other drugs, medicaments and biological substances status: Secondary | ICD-10-CM | POA: Insufficient documentation

## 2021-03-04 DIAGNOSIS — Z8619 Personal history of other infectious and parasitic diseases: Secondary | ICD-10-CM | POA: Diagnosis not present

## 2021-03-04 DIAGNOSIS — Z8249 Family history of ischemic heart disease and other diseases of the circulatory system: Secondary | ICD-10-CM | POA: Diagnosis not present

## 2021-03-04 DIAGNOSIS — Z7982 Long term (current) use of aspirin: Secondary | ICD-10-CM | POA: Insufficient documentation

## 2021-03-04 DIAGNOSIS — E282 Polycystic ovarian syndrome: Secondary | ICD-10-CM | POA: Diagnosis not present

## 2021-03-04 DIAGNOSIS — Z9682 Presence of neurostimulator: Secondary | ICD-10-CM | POA: Diagnosis not present

## 2021-03-04 DIAGNOSIS — K760 Fatty (change of) liver, not elsewhere classified: Secondary | ICD-10-CM | POA: Insufficient documentation

## 2021-03-04 DIAGNOSIS — Z79899 Other long term (current) drug therapy: Secondary | ICD-10-CM | POA: Insufficient documentation

## 2021-03-04 DIAGNOSIS — Z8759 Personal history of other complications of pregnancy, childbirth and the puerperium: Secondary | ICD-10-CM | POA: Insufficient documentation

## 2021-03-04 DIAGNOSIS — Z90710 Acquired absence of both cervix and uterus: Secondary | ICD-10-CM | POA: Diagnosis not present

## 2021-03-04 DIAGNOSIS — K219 Gastro-esophageal reflux disease without esophagitis: Secondary | ICD-10-CM | POA: Diagnosis not present

## 2021-03-04 DIAGNOSIS — I10 Essential (primary) hypertension: Secondary | ICD-10-CM | POA: Insufficient documentation

## 2021-03-04 DIAGNOSIS — H2512 Age-related nuclear cataract, left eye: Secondary | ICD-10-CM | POA: Insufficient documentation

## 2021-03-04 DIAGNOSIS — E7800 Pure hypercholesterolemia, unspecified: Secondary | ICD-10-CM | POA: Diagnosis not present

## 2021-03-04 HISTORY — PX: CATARACT EXTRACTION W/PHACO: SHX586

## 2021-03-04 SURGERY — PHACOEMULSIFICATION, CATARACT, WITH IOL INSERTION
Anesthesia: Monitor Anesthesia Care | Site: Eye | Laterality: Left

## 2021-03-04 MED ORDER — SIGHTPATH DOSE#1 SODIUM HYALURONATE 23 MG/ML IO SOLUTION
PREFILLED_SYRINGE | INTRAOCULAR | Status: DC | PRN
  Administered 2021-03-04: .6 mL via INTRAOCULAR

## 2021-03-04 MED ORDER — MOXIFLOXACIN HCL 0.5 % OP SOLN
OPHTHALMIC | Status: DC | PRN
  Administered 2021-03-04: 0.2 mL via OPHTHALMIC

## 2021-03-04 MED ORDER — SIGHTPATH DOSE#1 BSS IO SOLN
INTRAOCULAR | Status: DC | PRN
  Administered 2021-03-04: 15 mL

## 2021-03-04 MED ORDER — SIGHTPATH DOSE#1 BSS IO SOLN
INTRAOCULAR | Status: DC | PRN
  Administered 2021-03-04: 72 mL via OPHTHALMIC

## 2021-03-04 MED ORDER — PHENYLEPHRINE HCL 10 % OP SOLN
1.0000 [drp] | OPHTHALMIC | Status: AC
  Administered 2021-03-04 (×3): 1 [drp] via OPHTHALMIC

## 2021-03-04 MED ORDER — MIDAZOLAM HCL 2 MG/2ML IJ SOLN
INTRAMUSCULAR | Status: DC | PRN
  Administered 2021-03-04: 2 mg via INTRAVENOUS

## 2021-03-04 MED ORDER — LACTATED RINGERS IV SOLN: INTRAVENOUS | Status: DC

## 2021-03-04 MED ORDER — SIGHTPATH DOSE#1 SODIUM HYALURONATE 10 MG/ML IO SOLUTION
PREFILLED_SYRINGE | INTRAOCULAR | Status: DC | PRN
  Administered 2021-03-04: 0.55 mL via INTRAOCULAR

## 2021-03-04 MED ORDER — LIDOCAINE HCL (PF) 2 % IJ SOLN
INTRAOCULAR | Status: DC | PRN
  Administered 2021-03-04: 1 mL via INTRAOCULAR

## 2021-03-04 MED ORDER — TETRACAINE HCL 0.5 % OP SOLN
1.0000 [drp] | OPHTHALMIC | Status: AC | PRN
  Administered 2021-03-04 (×3): 1 [drp] via OPHTHALMIC

## 2021-03-04 MED ORDER — CYCLOPENTOLATE HCL 2 % OP SOLN
1.0000 [drp] | OPHTHALMIC | Status: AC
  Administered 2021-03-04 (×3): 1 [drp] via OPHTHALMIC

## 2021-03-04 MED ORDER — ACETAMINOPHEN 10 MG/ML IV SOLN: 1000.0000 mg | Freq: Once | INTRAVENOUS | Status: DC | PRN

## 2021-03-04 MED ORDER — ONDANSETRON HCL 4 MG/2ML IJ SOLN
4.0000 mg | Freq: Once | INTRAMUSCULAR | Status: AC | PRN
  Administered 2021-03-04: 4 mg via INTRAVENOUS

## 2021-03-04 MED ORDER — FENTANYL CITRATE (PF) 100 MCG/2ML IJ SOLN
INTRAMUSCULAR | Status: DC | PRN
  Administered 2021-03-04 (×2): 50 ug via INTRAVENOUS

## 2021-03-04 SURGICAL SUPPLY — 17 items
CANNULA ANT/CHMB 27G (MISCELLANEOUS) ×2 IMPLANT
CANNULA ANT/CHMB 27GA (MISCELLANEOUS) ×4 IMPLANT
DISSECTOR HYDRO NUCLEUS 50X22 (MISCELLANEOUS) ×2 IMPLANT
GLOVE SURG GAMMEX PI TX LF 7.5 (GLOVE) ×2 IMPLANT
GLOVE SURG SYN 8.5  E (GLOVE) ×2
GLOVE SURG SYN 8.5 E (GLOVE) ×1 IMPLANT
GLOVE SURG SYN 8.5 PF PI (GLOVE) ×1 IMPLANT
GOWN STRL REUS W/ TWL LRG LVL3 (GOWN DISPOSABLE) ×2 IMPLANT
GOWN STRL REUS W/TWL LRG LVL3 (GOWN DISPOSABLE) ×4
LENS ACRYSOF TORIC TRIFOC 20.5 ×2 IMPLANT
LENS IOL ACRSF IQ PAN 20.5 IMPLANT
MARKER SKIN DUAL TIP RULER LAB (MISCELLANEOUS) ×2 IMPLANT
PACK EYE AFTER SURG (MISCELLANEOUS) ×2 IMPLANT
SYR 3ML LL SCALE MARK (SYRINGE) ×2 IMPLANT
SYR TB 1ML LUER SLIP (SYRINGE) ×2 IMPLANT
WATER STERILE IRR 250ML POUR (IV SOLUTION) ×2 IMPLANT
WIPE NON LINTING 3.25X3.25 (MISCELLANEOUS) ×2 IMPLANT

## 2021-03-04 NOTE — Anesthesia Postprocedure Evaluation (Signed)
Anesthesia Post Note  Patient: Courtney Ross  Procedure(s) Performed: CATARACT EXTRACTION PHACO AND INTRAOCULAR  LENS PLACEMENT (IOC) LEFT PANOPTIX LENS 2.87 00:30.0 (Left: Eye)     Patient location during evaluation: PACU Anesthesia Type: MAC Level of consciousness: awake and alert Pain management: pain level controlled Vital Signs Assessment: post-procedure vital signs reviewed and stable Respiratory status: spontaneous breathing, nonlabored ventilation, respiratory function stable and patient connected to nasal cannula oxygen Cardiovascular status: stable and blood pressure returned to baseline Postop Assessment: no apparent nausea or vomiting Anesthetic complications: no   No notable events documented.  Eyob Godlewski A  Peng Thorstenson

## 2021-03-04 NOTE — Op Note (Signed)
OPERATIVE NOTE  ERNESTEEN HEADDEN [id number] 03/04/2021   PREOPERATIVE DIAGNOSIS:  Nuclear sclerotic cataract left eye.  H25.12   POSTOPERATIVE DIAGNOSIS:    Nuclear sclerotic cataract left eye.     PROCEDURE:  Phacoemusification with posterior chamber intraocular lens placement of the left eye   LENS:   Implant Name Type Inv. Item Serial No. Manufacturer Lot No. LRB No. Used Action  ACRYSOF TORIC TRIFOCAL 20.5 - [id number]  ACRYSOF TORIC TRIFOCAL 20.5 [id number] ALCON  Left 1 Implanted      Procedure(s) with comments: CATARACT EXTRACTION PHACO AND INTRAOCULAR  LENS PLACEMENT (IOC) LEFT PANOPTIX LENS 2.87 00:30.0 (Left) - PANOPTIX LENS  TFNT00 +20.5   ULTRASOUND TIME: 0 minutes 30 seconds.  CDE 2.87   SURGEON:  Benay Pillow, MD, MPH   ANESTHESIA:  Topical with tetracaine drops augmented with 1% preservative-free intracameral lidocaine.  ESTIMATED BLOOD LOSS: <1 mL   COMPLICATIONS:  None.   DESCRIPTION OF PROCEDURE:  The patient was identified in the holding room and transported to the operating room and placed in the supine position under the operating microscope.  The left eye was identified as the operative eye and it was prepped and draped in the usual sterile ophthalmic fashion.   A 1.0 millimeter clear-corneal paracentesis was made at the 5:00 position. 0.5 ml of preservative-free 1% lidocaine with epinephrine was injected into the anterior chamber.  The anterior chamber was filled with Healon 5 viscoelastic.  A 2.4 millimeter keratome was used to make a near-clear corneal incision at the 2:00 position.  A curvilinear capsulorrhexis was made with a cystotome and capsulorrhexis forceps.  Balanced salt solution was used to hydrodissect and hydrodelineate the nucleus.   Phacoemulsification was then used in stop and chop fashion to remove the lens nucleus and epinucleus.  The remaining cortex was then removed using the irrigation and aspiration handpiece. Healon was then placed  into the capsular bag to distend it for lens placement.  A lens was then injected into the capsular bag.  The remaining viscoelastic was aspirated.   Wounds were hydrated with balanced salt solution.  The anterior chamber was inflated to a physiologic pressure with balanced salt solution.  Intracameral vigamox 0.1 mL undiltued was injected into the eye and a drop placed onto the ocular surface.  The lens was well centered.   No wound leaks were noted.  The patient was taken to the recovery room in stable condition without complications of anesthesia or surgery  Benay Pillow 03/04/2021, 11:54 AM

## 2021-03-04 NOTE — H&P (Signed)
[facility]   Primary Care Physician:  Birdie Sons, MD Ophthalmologist: Dr. Benay Pillow  Pre-Procedure History & Physical: HPI:  Courtney Ross is a 65 y.o. female here for cataract surgery.   Past Medical History:  Diagnosis Date   Anxiety    Back pain    Colon polyp    Constipation    Degenerative disc disease, lumbar    Depression    Family history of adverse reaction to anesthesia    daughter nausea   Fatty liver    Fatty liver disease, nonalcoholic    GERD (gastroesophageal reflux disease)    H/O spinal fusion    Headache    migraines   Headache, migraine 05/08/2015   Hepatitis    in the 1970s.  Resolved.   High cholesterol    Hypertension    Lower back pain    Polycystic ovarian disease    PONV (postoperative nausea and vomiting)    due to surgery from the 1990's   Restless leg syndrome    Spasm 01/29/2009   Spinal cord stimulator status    Wears dentures    full upper, partial lower    Past Surgical History:  Procedure Laterality Date   APPENDECTOMY  1988   ARTHRODESIS METATARSALPHALANGEAL JOINT (MTPJ) Left 12/14/2014   Procedure: ARTHRODESIS METATARSALPHALANGEAL JOINT (MTPJ) 1ST;  Surgeon: Albertine Patricia, DPM;  Location: [place];  Service: Podiatry;  Laterality: Left;  LMA   back fusion  2004   lumbar region. had broken facet joint.    BACK SURGERY  [phone]   x3   BLADDER SUSPENSION  2006   BREAST BIOPSY Left 1990's   core Sankar   BREAST SURGERY  2005   lumpectomy   CARPAL TUNNEL RELEASE Bilateral 02/26/07,05/21/07   CATARACT EXTRACTION W/PHACO Right 02/11/2021   Procedure: CATARACT EXTRACTION PHACO AND INTRAOCULAR LENS PLACEMENT ([place]) RIGHT PANOPTIX LENS;  Surgeon: Eulogio Bear, MD;  Location: [place];  Service: Ophthalmology;  Laterality: Right;  PANOPTIX LENS 1.92 00:17.8   CHOLECYSTECTOMY N/A 05/03/2018   Procedure: LAPAROSCOPIC CHOLECYSTECTOMY WITH INTRAOPERATIVE CHOLANGIOGRAM;  Surgeon:  Robert Bellow, MD;  Location: [facility];  Service: General;  Laterality: N/A;   COLONOSCOPY     COLONOSCOPY WITH PROPOFOL N/A 01/10/2020   Procedure: COLONOSCOPY WITH PROPOFOL;  Surgeon: Jonathon Bellows, MD;  Location: [facility];  Service: Gastroenterology;  Laterality: N/A;   COLPORRHAPHY     DILATION AND CURETTAGE OF UTERUS     ECTOPIC PREGNANCY SURGERY  1982   FOOT SURGERY Left 2017   fusion on big toe, hammer toe, bunions   HAMMER TOE SURGERY Left 03/22/2015   Procedure: HAMMER TOE CORRECTION L 2ND AND 3RD ;  Surgeon: Albertine Patricia, DPM;  Location: [place];  Service: Podiatry;  Laterality: Left;  WITH LOCAL   HERNIA REPAIR Right 1988   inguinal   LAMINECTOMY  1993, 1994   NECK SURGERY  2006   fusion   RECTOCELE REPAIR N/A 08/25/2016   Procedure: POSTERIOR REPAIR (RECTOCELE);  Surgeon: Brayton Mars, MD;  Location: [facility];  Service: Gynecology;  Laterality: N/A;   SPINAL CORD STIMULATOR BATTERY EXCHANGE Right 04/08/2019   Procedure: SPINAL CORD STIMULATOR BATTERY EXCHANGE;  Surgeon: Clydell Hakim, MD;  Location: [place];  Service: Neurosurgery;  Laterality: Right;  SPINAL CORD STIMULATOR BATTERY EXCHANGE   SPINAL CORD STIMULATOR IMPLANT  2014   stimulator in the ileo inguinal nerve due to scar tissue in abdomen   TONSILLECTOMY  1985   TOTAL ABDOMINAL HYSTERECTOMY      [place]   VENTRAL HERNIA REPAIR N/A 09/01/2018   6.4 cm Ventra lite mesh. HERNIA REPAIR VENTRAL ADULT;  Surgeon: Robert Bellow, MD;  Location: [facility];  Service: General;  Laterality: N/A;    Prior to Admission medications   Medication Sig Start Date End Date Taking? Authorizing Provider  aspirin EC 81 MG tablet Take 81 mg by mouth at bedtime.   Yes [provider]  buPROPion (WELLBUTRIN SR) 150 MG 12 hr tablet TAKE 1 TABLET BY MOUTH 3 TIMES DAILY. 02/08/21  Yes Birdie Sons, MD  Cholecalciferol (VITAMIN D3) 5000 UNITS CAPS Take 5,000 Units by mouth daily.    Yes [provider]  Coenzyme Q10 200 MG capsule Take 200 mg by mouth daily.   Yes [provider]  cyclobenzaprine (FLEXERIL) 10 MG tablet Take 1 tablet (10 mg total) by mouth 3 (three) times daily as needed for muscle spasms. 08/24/20  Yes Birdie Sons, MD  diazepam (VALIUM) 10 MG tablet TAKE ONE TABLET EVERY 6 HOURS AS NEEDED FOR ANXIETY 08/24/20  Yes Birdie Sons, MD  diclofenac Sodium (VOLTAREN) 1 % GEL SMARTSIG:1 Gram(s) Topical 4 Times Daily PRN 06/13/19  Yes [provider]  estradiol (ESTRACE) 1 MG tablet TAKE ONE TABLET EVERY DAY 01/21/21  Yes Birdie Sons, MD  fluticasone (FLONASE) 50 MCG/ACT nasal spray Place 1 spray into both nostrils daily as needed for allergies or rhinitis.   Yes [provider]  hydroquinone 4 % cream Apply 1 application topically daily. 11/11/19  Yes Rubie Maid, MD  LINZESS 145 MCG CAPS capsule TAKE 1 CAPSULE EVERY DAY 04/10/20  Yes Birdie Sons, MD  meloxicam (MOBIC) 15 MG tablet Take 1 tablet (15 mg total) by mouth at bedtime as needed for pain. 02/21/21  Yes Birdie Sons, MD  Multiple Vitamin (MULTIVITAMIN WITH MINERALS) TABS tablet Take 1 tablet by mouth daily.   Yes [provider]  mupirocin cream (BACTROBAN) 2 % APPLY TO AFFECTED AREAS TOPICALLY TWICE DAILY 07/07/19  Yes Birdie Sons, MD  MYRBETRIQ 25 MG TB24 tablet TAKE 1 TABLET BY MOUTH DAILY. 01/07/21  Yes Birdie Sons, MD  omeprazole (PRILOSEC) 40 MG capsule Take 1 capsule (40 mg total) by mouth daily as needed (heartburn). 02/18/21  Yes Birdie Sons, MD  oxyCODONE (ROXICODONE) 15 MG immediate release tablet TAKE 1-2 TABLETS BY MOUTH EVERY 4 HOURS FOR PAIN. 02/21/21  Yes Birdie Sons, MD  phentermine 37.5 MG capsule Take 1 capsule (37.5 mg total) by mouth every morning. 11/09/19  Yes Rubie Maid, MD  polyethylene glycol (MIRALAX / GLYCOLAX) packet Take 17 g by mouth 2 (two) times daily as needed for mild constipation (dissolve in an 8 oz glass of  water). 07/21/18  Yes Birdie Sons, MD  pramipexole (MIRAPEX) 0.25 MG tablet Start 1/2 tablet 1 hour before bedtime for 4 days, then increase to 1 tablet before bedtime for 4 days, then 2 tablets before bedtime 02/18/21  Yes Fisher, Kirstie Peri, MD  pregabalin (LYRICA) 75 MG capsule TAKE 1 CAPSULE BY MOUTH 3 TIMES DAILY Patient taking differently: Take 150 mg by mouth 2 (two) times daily. 01/06/20  Yes Birdie Sons, MD  RESTASIS 0.05 % ophthalmic emulsion Place 1 drop into both eyes 2 (two) times daily.  11/21/15  Yes [provider]  rosuvastatin (CRESTOR) 10 MG tablet Take 1 tablet (10 mg total) by mouth daily.  02/18/21  Yes Birdie Sons, MD  Turmeric 500 MG CAPS Take 500 mg by mouth daily.  05/05/19  Yes [provider]    Allergies as of 01/11/2021 - Review Complete 07/09/2020  Allergen Reaction Noted   Hydrocodone Itching 12/05/2014   Lisinopril Cough 10/29/2016   Mirtazapine  05/29/2015   Other Nausea And Vomiting 04/28/2018    Family History  Problem Relation Age of Onset   Diabetes Father    Hypertension Father    CAD Father    Diabetes Sister        non-Insulin dependent diabetes mellitus   Heart disease Brother    Cancer Daughter        cancer of the cervix at age 41-18   Heart disease Brother    Breast cancer Paternal Aunt    Colon cancer Paternal Uncle    Ovarian cancer Neg Hx     Social History   Socioeconomic History   Marital status: Legally Separated    Spouse name: Not on file   Number of children: 4   Years of education: Not on file   Highest education level: 9th grade  Occupational History    Comment: disabled  Tobacco Use   Smoking status: Never   Smokeless tobacco: Never  Vaping Use   Vaping Use: Never used  Substance and Sexual Activity   Alcohol use: Yes    Comment: rare   Drug use: No   Sexual activity: Yes    Birth control/protection: Surgical  Other Topics Concern   Not on file  Social History Narrative   Pt is  currently receiving assistance from [place] and gets food stamps.    Social Determinants of Health   Financial Resource Strain: Low Risk    Difficulty of Paying Living Expenses: Not hard at all  Food Insecurity: No Food Insecurity   Worried About Charity fundraiser in the Last Year: Never true   [place] in the Last Year: Never true  Transportation Needs: No Transportation Needs   Lack of Transportation (Medical): No   Lack of Transportation (Non-Medical): No  Physical Activity: Insufficiently Active   Days of Exercise per Week: 3 days   Minutes of Exercise per Session: 30 min  Stress: No Stress Concern Present   Feeling of Stress : Not at all  Social Connections: Unknown   Frequency of Communication with Friends and Family: More than three times a week   Frequency of Social Gatherings with Friends and Family: More than three times a week   Attends Religious Services: More than 4 times per year   Active Member of [employer] or Organizations: Yes   Attends Music therapist: More than 4 times per year   Marital Status: Not on file  Intimate Partner Violence: Not At Risk   Fear of Current or Ex-Partner: No   Emotionally Abused: No   Physically Abused: No   Sexually Abused: No    Review of Systems: See HPI, otherwise negative ROS  Physical Exam: BP 130/90   Pulse 78   Temp 98.1 F (36.7 C) (Temporal)   Resp 18   Wt 87.5 kg   SpO2 99%   BMI 29.35 kg/m  General:   Alert, cooperative in NAD Head:  Normocephalic and atraumatic. Respiratory:  Normal work of breathing. Cardiovascular:  RRR  Impression/Plan: Courtney Ross is here for cataract surgery.  Risks, benefits, limitations, and alternatives regarding cataract surgery have been reviewed with the patient.  Questions have been answered.  All parties agreeable.   Benay Pillow, MD  03/04/2021, 11:21 AM

## 2021-03-04 NOTE — Transfer of Care (Signed)
Immediate Anesthesia Transfer of Care Note  Patient: Courtney Ross  Procedure(s) Performed: CATARACT EXTRACTION PHACO AND INTRAOCULAR  LENS PLACEMENT (I[facility]) LEFT PANOPTIX LENS 2.87 00:30.0 (Left: Eye)  Patient Location: PACU  Anesthesia Type: MAC  Level of Consciousness: awake, alert  and patient cooperative  Airway and Oxygen Therapy: Patient Spontanous Breathing and Patient connected to supplemental oxygen  Post-op Assessment: Post-op Vital signs reviewed, Patient's Cardiovascular Status Stable, Respiratory Function Stable, Patent Airway and No signs of Nausea or vomiting  Post-op Vital Signs: Reviewed and stable  Complications: No notable events documented.

## 2021-03-04 NOTE — Anesthesia Procedure Notes (Signed)
Procedure Name: MAC Date/Time: 03/04/2021 11:39 AM Performed by: Jeannene Patella, CRNA Pre-anesthesia Checklist: Patient identified, Emergency Drugs available, Suction available, Timeout performed and Patient being monitored Patient Re-evaluated:Patient Re-evaluated prior to induction Oxygen Delivery Method: Nasal cannula Placement Confirmation: positive ETCO2

## 2021-03-05 ENCOUNTER — Encounter: Payer: Self-pay | Admitting: Ophthalmology

## 2021-03-12 ENCOUNTER — Telehealth: Payer: Self-pay

## 2021-03-12 MED ORDER — PREGABALIN 75 MG PO CAPS: 75.0000 mg | ORAL_CAPSULE | Freq: Three times a day (TID) | ORAL | 1 refills | Status: DC

## 2021-03-12 NOTE — Telephone Encounter (Signed)
[facility] faxed refill request for the following medications:  pregabalin (LYRICA) 75 MG capsule    Please advise.

## 2021-03-14 ENCOUNTER — Other Ambulatory Visit: Payer: Self-pay

## 2021-03-14 MED ORDER — PREGABALIN 75 MG PO CAPS: 75.0000 mg | ORAL_CAPSULE | Freq: Three times a day (TID) | ORAL | 1 refills | Status: DC

## 2021-03-14 NOTE — Telephone Encounter (Signed)
It looks like this was sent to [facility] and should have been sent to [facility].  We received another fax requesting this medication.  Can we please send to the correct pharmacy.

## 2021-03-21 ENCOUNTER — Other Ambulatory Visit: Payer: Self-pay | Admitting: Family Medicine

## 2021-03-21 NOTE — Telephone Encounter (Signed)
Patient would like her oxyCODONE (ROXICODONE) 15 MG immediate release tablet sent to   [place], [place] Phone:  [phone]  Fax:  [phone]

## 2021-03-22 MED ORDER — OXYCODONE HCL 15 MG PO TABS: ORAL_TABLET | ORAL | 0 refills | Status: DC

## 2021-03-22 NOTE — Telephone Encounter (Signed)
LOV:  07/27/2020 NOV: None   Last Refill:  02/21/2021  #180 0 Refills.   Thanks,   -Mickel Baas

## 2021-03-22 NOTE — Telephone Encounter (Signed)
Requested medications are due for refill today yes  Requested medications are on the active medication list yes  Last refill 02/28/21  Last visit 02/18/21, do not see this med/dx addressed in 12 months  Future visit scheduled yes, 07/15/21  Notes to clinic Unsure when this med/dx was addressed, does have upcoming appt in Jan, please assess.

## 2021-03-22 NOTE — Telephone Encounter (Signed)
Please review. Thanks!  

## 2021-04-05 ENCOUNTER — Other Ambulatory Visit: Payer: Self-pay | Admitting: Family Medicine

## 2021-04-05 DIAGNOSIS — G2581 Restless legs syndrome: Secondary | ICD-10-CM

## 2021-04-06 NOTE — Telephone Encounter (Signed)
Requested Prescriptions  Pending Prescriptions Disp Refills  . pramipexole (MIRAPEX) 0.25 MG tablet [Pharmacy Med Name: PRAMIPEXOLE  0.25MG   TAB] 180 tablet 3    Sig: TAKE 1/2 TABLET 1 HOUR  BEFORE BEDTIME FOR 4 DAYS,  THEN INCREASE TO 1 TAB  BEFORE BEDTIME FOR 4 DAYS,  THEN 2 TABS BEFORE BEDTIME     Neurology:  Parkinsonian Agents Passed - 04/05/2021 10:52 PM      Passed - Last BP in normal range    BP Readings from Last 1 Encounters:  03/04/21 115/75         Passed - Valid encounter within last 12 months    Recent Outpatient Visits          1 month ago Restless legs syndrome   [facility] Birdie Sons, MD   8 months ago Essential hypertension   [facility] Birdie Sons, MD   1 year ago Vitamin D deficiency   [facility] Birdie Sons, MD   2 years ago Annual physical exam   [facility] Birdie Sons, MD   4 years ago Elevated blood pressure reading   [place], Kirstie Peri, MD

## 2021-04-17 ENCOUNTER — Telehealth: Payer: Self-pay

## 2021-04-17 NOTE — Telephone Encounter (Signed)
Copied from [place] [phone]. Topic: Appointment Scheduling - Scheduling Inquiry for Clinic >> Apr 17, 2021 12:14 PM Scherrie Gerlach wrote: Reason for CRM: pt would like a shingles vaccine. Please call.  Also needs a flu shot asap

## 2021-04-19 ENCOUNTER — Other Ambulatory Visit: Payer: Self-pay | Admitting: *Deleted

## 2021-04-19 MED ORDER — OXYCODONE HCL 15 MG PO TABS: ORAL_TABLET | ORAL | 0 refills | Status: DC

## 2021-04-19 NOTE — Telephone Encounter (Signed)
Returned call to patient and advised shingles vaccine info.

## 2021-04-22 ENCOUNTER — Telehealth: Payer: Self-pay | Admitting: *Deleted

## 2021-04-22 DIAGNOSIS — L7211 Pilar cyst: Secondary | ICD-10-CM

## 2021-04-22 NOTE — Telephone Encounter (Signed)
Patient is requesting a referral to Dermatology for a Pillar Cyst. Please advise?

## 2021-04-24 ENCOUNTER — Ambulatory Visit (INDEPENDENT_AMBULATORY_CARE_PROVIDER_SITE_OTHER): Payer: 59 | Admitting: Physician Assistant

## 2021-04-24 ENCOUNTER — Other Ambulatory Visit: Payer: Self-pay

## 2021-04-24 DIAGNOSIS — Z23 Encounter for immunization: Secondary | ICD-10-CM | POA: Diagnosis not present

## 2021-04-24 MED ORDER — DICLOFENAC SODIUM 1 % EX GEL: CUTANEOUS | 4 refills | Status: AC

## 2021-05-02 ENCOUNTER — Other Ambulatory Visit: Payer: Self-pay | Admitting: Family Medicine

## 2021-05-22 ENCOUNTER — Other Ambulatory Visit: Payer: Self-pay | Admitting: Family Medicine

## 2021-05-22 NOTE — Telephone Encounter (Signed)
Copied from [place] [phone]. Topic: Quick Communication - Rx Refill/Question >> May 22, 2021 10:35 AM Leward Quan A wrote: Medication: oxyCODONE (ROXICODONE) 15 MG immediate release tablet   Has the patient contacted their pharmacy? No. Controlled substance  (Agent: If no, request that the patient contact the pharmacy for the refill. If patient does not wish to contact the pharmacy document the reason why and proceed with request.) (Agent: If yes, when and what did the pharmacy advise?)  Preferred Pharmacy (with phone number or street name): [place], [place] - [place]  Phone:  [phone] Fax:  [phone]    Has the patient been seen for an appointment in the last year OR does the patient have an upcoming appointment? Yes.    Agent: Please be advised that RX refills may take up to 3 business days. We ask that you follow-up with your pharmacy.

## 2021-05-23 MED ORDER — OXYCODONE HCL 15 MG PO TABS: ORAL_TABLET | ORAL | 0 refills | Status: DC

## 2021-05-23 NOTE — Telephone Encounter (Signed)
Requested medications are due for refill today.  yes  Requested medications are on the active medications list.  yes  Last refill. 04/19/2021  Future visit scheduled.   yes  Notes to clinic.  Medication not delegated.

## 2021-06-20 ENCOUNTER — Other Ambulatory Visit: Payer: Self-pay | Admitting: Family Medicine

## 2021-06-20 NOTE — Telephone Encounter (Signed)
Requested medication (s) are due for refill today: yes  Requested medication (s) are on the active medication list: yes  Last refill:  valium- 08/24/20 #120 5 refills ,  oxycodone - 05/23/21 #180 0 refills   Future visit scheduled: no  Notes to clinic:  not delegated per protocol do you want to refill Rx?     Requested Prescriptions  Pending Prescriptions Disp Refills   diazepam (VALIUM) 10 MG tablet 120 tablet 5    Sig: TAKE ONE TABLET EVERY 6 HOURS AS NEEDED FOR ANXIETY     Not Delegated - Psychiatry:  Anxiolytics/Hypnotics Failed - 06/20/2021 11:54 AM      Failed - This refill cannot be delegated      Failed - Urine Drug Screen completed in last 360 days      Passed - Valid encounter within last 6 months    Recent Outpatient Visits           4 months ago Restless legs syndrome   [facility] Birdie Sons, MD   10 months ago Essential hypertension   [facility] Birdie Sons, MD   1 year ago Vitamin D deficiency   [place], Kirstie Peri, MD   2 years ago Annual physical exam   [facility] Birdie Sons, MD   4 years ago Elevated blood pressure reading   [facility] Birdie Sons, MD       Future Appointments             In 4 months Ralene Bathe, MD [place]             oxyCODONE (ROXICODONE) 15 MG immediate release tablet 180 tablet 0    Sig: TAKE 1-2 TABLETS BY MOUTH EVERY 4 HOURS FOR PAIN.     Not Delegated - Analgesics:  Opioid Agonists Failed - 06/20/2021 11:54 AM      Failed - This refill cannot be delegated      Failed - Urine Drug Screen completed in last 360 days      Passed - Valid encounter within last 6 months    Recent Outpatient Visits           4 months ago Restless legs syndrome   [facility] Birdie Sons, MD   10 months ago Essential hypertension   [facility] Birdie Sons, MD   1 year ago  Vitamin D deficiency   [facility] Birdie Sons, MD   2 years ago Annual physical exam   [facility] Birdie Sons, MD   4 years ago Elevated blood pressure reading   [facility] Birdie Sons, MD       Future Appointments             In 4 months Ralene Bathe, MD [place]

## 2021-06-20 NOTE — Telephone Encounter (Signed)
Medication Refill - Medication: diazepam (VALIUM) 10 MG tablet  oxyCODONE (ROXICODONE) 15 MG immediate release tablet   Has the patient contacted their pharmacy? No. Advised to call these in with office  (Agent: If no, request that the patient contact the pharmacy for the refill. If patient does not wish to contact the pharmacy document the reason why and proceed with request.) (Agent: If yes, when and what did the pharmacy advise?)  Preferred Pharmacy (with phone number or street name): [facility]  Has the patient been seen for an appointment in the last year OR does the patient have an upcoming appointment? Yes.    Agent: Please be advised that RX refills may take up to 3 business days. We ask that you follow-up with your pharmacy.

## 2021-06-21 MED ORDER — DIAZEPAM 10 MG PO TABS: ORAL_TABLET | ORAL | 1 refills | Status: DC

## 2021-06-21 MED ORDER — OXYCODONE HCL 15 MG PO TABS: ORAL_TABLET | ORAL | 0 refills | Status: DC

## 2021-07-03 ENCOUNTER — Other Ambulatory Visit: Payer: Self-pay | Admitting: Family Medicine

## 2021-07-03 DIAGNOSIS — F324 Major depressive disorder, single episode, in partial remission: Secondary | ICD-10-CM

## 2021-07-03 NOTE — Telephone Encounter (Signed)
Patient will need an office visit for further refills. Requested Prescriptions  Pending Prescriptions Disp Refills   buPROPion (WELLBUTRIN SR) 150 MG 12 hr tablet [Pharmacy Med Name: buPROPion HCl ER (SR) 150 MG Oral Tablet Extended Release 12 Hour] 270 tablet 0    Sig: TAKE 1 TABLET BY MOUTH 3  TIMES DAILY     Psychiatry: Antidepressants - bupropion Passed - 07/03/2021 10:26 PM      Passed - Completed PHQ-2 or PHQ-9 in the last 360 days      Passed - Last BP in normal range    BP Readings from Last 1 Encounters:  03/04/21 115/75         Passed - Valid encounter within last 6 months    Recent Outpatient Visits          4 months ago Restless legs syndrome   [facility] Birdie Sons, MD   11 months ago Essential hypertension   [facility] Birdie Sons, MD   1 year ago Vitamin D deficiency   [facility] Birdie Sons, MD   2 years ago Annual physical exam   [facility] Birdie Sons, MD   4 years ago Elevated blood pressure reading   [facility] Birdie Sons, MD      Future Appointments            In 3 months Ralene Bathe, MD [place]

## 2021-07-04 ENCOUNTER — Ambulatory Visit: Payer: Self-pay | Admitting: *Deleted

## 2021-07-04 DIAGNOSIS — N760 Acute vaginitis: Secondary | ICD-10-CM

## 2021-07-04 NOTE — Telephone Encounter (Signed)
°  Chief Complaint: Yeast infection Symptoms: itching, slight whitish discharge Frequency: 1 week ago Pertinent Negatives: Patient denies Pain, abdominal pain, odor,rash, fever, no recent ATBs Disposition: [] ED /[] Urgent Care (no appt availability in office) / [] Appointment(In office/virtual)/ []  [place] [facility]/ [x] Home Care/ [] Refused Recommended Disposition /[] [place] [facility]/ []  Follow-up with PCP Additional Notes: Home care advise given. HAs taken OTC meds, ineffective, States H/O. Requesting Diflucan be called in for her. [facility] if appropriate      Reason for Disposition  [1] Symptoms of a yeast infection (i.e., itchy, white discharge, not bad smelling) AND [2] feels like prior vaginal yeast infections  Answer Assessment - Initial Assessment Questions 1. SYMPTOM: "What's the main symptom you're concerned about?" (e.g., pain, itching, dryness)     Itching 2. LOCATION: "Where is the  *No Answer* located?" (e.g., inside/outside, left/right)     Vaginal yeast infection 3. ONSET: "When did the    start?"     Last week 4. PAIN: "Is there any pain?" If Yes, ask: "How bad is it?" (Scale: 1-10; mild, moderate, severe)     no 5. ITCHING: "Is there any itching?" If Yes, ask: "How bad is it?" (Scale: 1-10; mild, moderate, severe)     yes 6. CAUSE: "What do you think is causing the discharge?" "Have you had the same problem before? What happened then?"     Yeast 7. OTHER SYMPTOMS: "Do you have any other symptoms?" (e.g., fever, itching, vaginal bleeding, pain with urination, injury to genital area, vaginal foreign bodynfection)     Yeast infection  Protocols used: Vaginal Symptoms-A-AH

## 2021-07-05 MED ORDER — FLUCONAZOLE 150 MG PO TABS: 150.0000 mg | ORAL_TABLET | Freq: Once | ORAL | 0 refills | Status: AC

## 2021-07-05 NOTE — Addendum Note (Signed)
Addended by: Birdie Sons on: 07/05/2021 09:31 AM   Modules accepted: Orders

## 2021-07-15 ENCOUNTER — Ambulatory Visit (INDEPENDENT_AMBULATORY_CARE_PROVIDER_SITE_OTHER): Payer: 59

## 2021-07-15 DIAGNOSIS — Z1231 Encounter for screening mammogram for malignant neoplasm of breast: Secondary | ICD-10-CM | POA: Diagnosis not present

## 2021-07-15 DIAGNOSIS — Z0000 Encounter for general adult medical examination without abnormal findings: Secondary | ICD-10-CM

## 2021-07-15 NOTE — Progress Notes (Signed)
Virtual Visit via Telephone Note  I connected with  Courtney Ross on 07/15/21 at 11:00 AM EST by telephone and verified that I am speaking with the correct person using two identifiers.  Location: Patient: home Provider: [facility] Persons participating in the virtual visit: [place]   I discussed the limitations, risks, security and privacy concerns of performing an evaluation and management service by telephone and the availability of in person appointments. The patient expressed understanding and agreed to proceed.  Interactive audio and video telecommunications were attempted between this nurse and patient, however failed, due to patient having technical difficulties OR patient did not have access to video capability.  We continued and completed visit with audio only.  Some vital signs may be absent or patient reported.   Dionisio David, LPN  Subjective:   Courtney Ross is a 66 y.o. female who presents for Medicare Annual (Subsequent) preventive examination.  Review of Systems           Objective:    There were no vitals filed for this visit. There is no height or weight on file to calculate BMI.  Advanced Directives 03/04/2021 02/11/2021 07/09/2020 01/10/2020 07/05/2019 04/05/2019 02/17/2019  Does Patient Have a Medical Advance Directive? Yes Yes Yes Yes Yes Yes Yes  Type of Paramedic of [place];Living will [place];Living will [place];Living will - [place];Living will [place];Living will [place];Living will  Does patient want to make changes to medical advance directive? - No - Patient declined - - - - No - Patient declined  Copy of [address] in Chart? - Yes - validated most recent copy scanned in chart (See row information) Yes - validated most recent copy scanned in chart (See row information) - Yes - validated most  recent copy scanned in chart (See row information) No - copy requested Yes - validated most recent copy scanned in chart (See row information)  Would patient like information on creating a medical advance directive? - - - - - - -    Current Medications (verified) Outpatient Encounter Medications as of 07/15/2021  Medication Sig   mirabegron ER (MYRBETRIQ) 25 MG TB24 tablet Take by mouth.   aspirin EC 81 MG tablet Take 81 mg by mouth at bedtime.   buPROPion (WELLBUTRIN SR) 150 MG 12 hr tablet TAKE 1 TABLET BY MOUTH 3  TIMES DAILY   Cholecalciferol (VITAMIN D3) 5000 UNITS CAPS Take 5,000 Units by mouth daily.    Coenzyme Q10 200 MG capsule Take 200 mg by mouth daily.   cyclobenzaprine (FLEXERIL) 10 MG tablet Take 1 tablet (10 mg total) by mouth 3 (three) times daily as needed for muscle spasms.   diazepam (VALIUM) 10 MG tablet TAKE ONE TABLET EVERY 6 HOURS AS NEEDED FOR ANXIETY   diclofenac Sodium (VOLTAREN) 1 % GEL SMARTSIG:1 Gram(s) Topical 4 Times Daily PRN   estradiol (ESTRACE) 1 MG tablet TAKE 1 TABLET BY MOUTH  DAILY   fluticasone (FLONASE) 50 MCG/ACT nasal spray Place 1 spray into both nostrils daily as needed for allergies or rhinitis.   hydroquinone 4 % cream Apply 1 application topically daily.   LINZESS 145 MCG CAPS capsule TAKE 1 CAPSULE BY MOUTH  DAILY   meloxicam (MOBIC) 15 MG tablet Take 1 tablet (15 mg total) by mouth at bedtime as needed for pain.   Multiple Vitamin (MULTIVITAMIN WITH MINERALS) TABS tablet Take 1 tablet  by mouth daily.   mupirocin cream (BACTROBAN) 2 % APPLY TO AFFECTED AREAS TOPICALLY TWICE DAILY   MYRBETRIQ 25 MG TB24 tablet TAKE 1 TABLET BY MOUTH DAILY.   omeprazole (PRILOSEC) 40 MG capsule Take 1 capsule (40 mg total) by mouth daily as needed (heartburn).   oxyCODONE (ROXICODONE) 15 MG immediate release tablet TAKE 1-2 TABLETS BY MOUTH EVERY 4 HOURS FOR PAIN.   phentermine 37.5 MG capsule Take 1 capsule (37.5 mg total) by mouth every morning.    polyethylene glycol (MIRALAX / GLYCOLAX) packet Take 17 g by mouth 2 (two) times daily as needed for mild constipation (dissolve in an 8 oz glass of water).   pramipexole (MIRAPEX) 0.25 MG tablet TAKE 1/2 TABLET 1 HOUR  BEFORE BEDTIME FOR 4 DAYS,  THEN INCREASE TO 1 TAB  BEFORE BEDTIME FOR 4 DAYS,  THEN 2 TABS BEFORE BEDTIME   pregabalin (LYRICA) 150 MG capsule Take 150 mg by mouth 2 (two) times daily.   pregabalin (LYRICA) 75 MG capsule Take 1 capsule (75 mg total) by mouth 3 (three) times daily.   RESTASIS 0.05 % ophthalmic emulsion Place 1 drop into both eyes 2 (two) times daily.    rosuvastatin (CRESTOR) 10 MG tablet Take 1 tablet (10 mg total) by mouth daily.   scopolamine (TRANSDERM SCOP, 1.5 MG,) 1 MG/3DAYS Transderm-Scop 1 mg over 3 days transdermal patch   Turmeric 500 MG CAPS Take 500 mg by mouth daily.    No facility-administered encounter medications on file as of 07/15/2021.    Allergies (verified) Hydrocodone, Lisinopril, Mirtazapine, and Other   History: Past Medical History:  Diagnosis Date   Anxiety    Back pain    Colon polyp    Constipation    Degenerative disc disease, lumbar    Depression    Family history of adverse reaction to anesthesia    daughter nausea   Fatty liver    Fatty liver disease, nonalcoholic    GERD (gastroesophageal reflux disease)    H/O spinal fusion    Headache    migraines   Headache, migraine 05/08/2015   Hepatitis    in the 1970s.  Resolved.   High cholesterol    Hypertension    Lower back pain    Polycystic ovarian disease    PONV (postoperative nausea and vomiting)    due to surgery from the 1990's   Restless leg syndrome    Spasm 01/29/2009   Spinal cord stimulator status    Wears dentures    full upper, partial lower   Past Surgical History:  Procedure Laterality Date   APPENDECTOMY  1988   ARTHRODESIS METATARSALPHALANGEAL JOINT (MTPJ) Left 12/14/2014   Procedure: ARTHRODESIS METATARSALPHALANGEAL JOINT (MTPJ) 1ST;   Surgeon: Albertine Patricia, DPM;  Location: [place];  Service: Podiatry;  Laterality: Left;  LMA   back fusion  2004   lumbar region. had broken facet joint.    BACK SURGERY  [id number]   x3   BLADDER SUSPENSION  2006   BREAST BIOPSY Left 1990's   core Sankar   BREAST SURGERY  2005   lumpectomy   CARPAL TUNNEL RELEASE Bilateral 02/26/07,05/21/07   CATARACT EXTRACTION W/PHACO Right 02/11/2021   Procedure: CATARACT EXTRACTION PHACO AND INTRAOCULAR LENS PLACEMENT ([place]) RIGHT PANOPTIX LENS;  Surgeon: Eulogio Bear, MD;  Location: [place];  Service: Ophthalmology;  Laterality: Right;  PANOPTIX LENS 1.92 00:17.8   CATARACT EXTRACTION W/PHACO Left 03/04/2021   Procedure: CATARACT EXTRACTION PHACO AND INTRAOCULAR  LENS PLACEMENT ([facility]) LEFT PANOPTIX LENS 2.87 00:30.0;  Surgeon: Eulogio Bear, MD;  Location: [place];  Service: Ophthalmology;  Laterality: Left;  PANOPTIX LENS   CHOLECYSTECTOMY N/A 05/03/2018   Procedure: LAPAROSCOPIC CHOLECYSTECTOMY WITH INTRAOPERATIVE CHOLANGIOGRAM;  Surgeon: Robert Bellow, MD;  Location: [facility];  Service: General;  Laterality: N/A;   COLONOSCOPY     COLONOSCOPY WITH PROPOFOL N/A 01/10/2020   Procedure: COLONOSCOPY WITH PROPOFOL;  Surgeon: Jonathon Bellows, MD;  Location: [facility];  Service: Gastroenterology;  Laterality: N/A;   COLPORRHAPHY     DILATION AND CURETTAGE OF UTERUS     ECTOPIC PREGNANCY SURGERY  1982   FOOT SURGERY Left 2017   fusion on big toe, hammer toe, bunions   HAMMER TOE SURGERY Left 03/22/2015   Procedure: HAMMER TOE CORRECTION L 2ND AND 3RD ;  Surgeon: Albertine Patricia, DPM;  Location: [place];  Service: Podiatry;  Laterality: Left;  WITH LOCAL   HERNIA REPAIR Right 1988   inguinal   LAMINECTOMY  1993, 1994   NECK SURGERY  2006   fusion   RECTOCELE REPAIR N/A 08/25/2016   Procedure: POSTERIOR REPAIR (RECTOCELE);  Surgeon: Brayton Mars, MD;  Location: [facility];  Service:  Gynecology;  Laterality: N/A;   SPINAL CORD STIMULATOR BATTERY EXCHANGE Right 04/08/2019   Procedure: SPINAL CORD STIMULATOR BATTERY EXCHANGE;  Surgeon: Clydell Hakim, MD;  Location: [place];  Service: Neurosurgery;  Laterality: Right;  SPINAL CORD STIMULATOR BATTERY EXCHANGE   SPINAL CORD STIMULATOR IMPLANT  2014   stimulator in the ileo inguinal nerve due to scar tissue in abdomen   TONSILLECTOMY  1985   [place]OTAL [place]   VENTRAL HERNIA REPAIR N/A 09/01/2018   6.4 cm Ventra lite mesh. HERNIA REPAIR VENTRAL ADULT;  Surgeon: Robert Bellow, MD;  Location: [facility];  Service: General;  Laterality: N/A;   Family History  Problem Relation Age of Onset   Diabetes Father    Hypertension Father    CAD Father    Diabetes Sister        non-Insulin dependent diabetes mellitus   Heart disease Brother    Cancer Daughter        cancer of the cervix at age 56-18   Heart disease Brother    Breast cancer Paternal Aunt    Colon cancer Paternal Uncle    Ovarian cancer Neg Hx    Social History   Socioeconomic History   Marital status: Legally Separated    Spouse name: Not on file   Number of children: 4   Years of education: Not on file   Highest education level: 9th grade  Occupational History    Comment: disabled  Tobacco Use   Smoking status: Never   Smokeless tobacco: Never  Vaping Use   Vaping Use: Never used  Substance and Sexual Activity   Alcohol use: Yes    Comment: rare   Drug use: No   Sexual activity: Yes    Birth control/protection: Surgical  Other Topics Concern   Not on file  Social History Narrative   Pt is currently receiving assistance from [employer] and gets food stamps.    Social Determinants of Health   Financial Resource Strain: Low Risk    Difficulty of Paying Living Expenses: Not hard at all  Food Insecurity: No Food Insecurity   Worried About Charity fundraiser in the Last Year: Never true   Arboriculturist in  the Last Year: Never  true  Transportation Needs: No Transportation Needs   Lack of Transportation (Medical): No   Lack of Transportation (Non-Medical): No  Physical Activity: Insufficiently Active   Days of Exercise per Week: 3 days   Minutes of Exercise per Session: 30 min  Stress: No Stress Concern Present   Feeling of Stress : Not at all  Social Connections: Unknown   Frequency of Communication with Friends and Family: More than three times a week   Frequency of Social Gatherings with Friends and Family: More than three times a week   Attends Religious Services: More than 4 times per year   Active Member of [employer] or Organizations: Yes   Attends Music therapist: More than 4 times per year   Marital Status: Not on file    Tobacco Counseling Counseling given: Not Answered   Clinical Intake:  Pre-visit preparation completed: Yes  Pain : No/denies pain     Nutritional Risks: None Diabetes: No  How often do you need to have someone help you when you read instructions, pamphlets, or other written materials from your doctor or pharmacy?: 1 - Never  Diabetic?no  Interpreter Needed?: No  Information entered by :: Kirke Shaggy, LPN   Activities of Daily Living In your present state of health, do you have any difficulty performing the following activities: 07/15/2021 02/11/2021  Hearing? N N  Vision? N N  Difficulty concentrating or making decisions? N N  Walking or climbing stairs? N N  Dressing or bathing? N N  Doing errands, shopping? N -  Preparing Food and eating ? N -  Using the Toilet? N -  In the past six months, have you accidently leaked urine? N -  Do you have problems with loss of bowel control? N -  Managing your Medications? N -  Managing your Finances? N -  Housekeeping or managing your Housekeeping? N -  Some recent data might be hidden    Patient Care Team: Birdie Sons, MD as PCP - General (Family Medicine) Leandrew Koyanagi, MD as Referring  Physician (Ophthalmology) Arnell Asal, MD as Referring Physician (Neurosurgery) Bary Castilla, Forest Gleason, MD (General Surgery) Jonathon Bellows, MD as Consulting Physician (Gastroenterology) Caroline More, DPM as Consulting Physician (Podiatry) Rubie Maid, MD as Referring Physician (Obstetrics and Gynecology)  Indicate any recent Medical Services you may have received from other than [facility] providers in the past year (date may be approximate).     Assessment:   This is a routine wellness examination for Courtney Ross.  Hearing/Vision screen No results found.  Dietary issues and exercise activities discussed:     Goals Addressed   None    Depression Screen PHQ 2/9 Scores 02/18/2021 07/09/2020 01/23/2020 02/17/2019 01/27/2019 07/07/2018 06/30/2018  PHQ - 2 Score 1 0 0 0 0 0 0  PHQ- 9 Score 3 - 4 - - 4 2    Fall Risk Fall Risk  07/15/2021 07/09/2020 07/05/2019 01/27/2019 09/16/2018  Falls in the past year? 0 0 0 0 0  Number falls in past yr: 0 0 0 - 0  Injury with Fall? 0 0 0 - -  Follow up - - - - Falls evaluation completed    FALL RISK PREVENTION PERTAINING TO THE HOME:  Any stairs in or around the home? Yes  If so, are there any without handrails? No  Home free of loose throw rugs in walkways, pet beds, electrical cords, etc? Yes  Adequate lighting in your home to reduce  risk of falls? Yes   ASSISTIVE DEVICES UTILIZED TO PREVENT FALLS:  Life alert? Yes  Use of a cane, walker or w/c? No  Grab bars in the bathroom? Yes  Shower chair or bench in shower? Yes  Elevated toilet seat or a handicapped toilet? No   Cognitive Function:Normal cognitive status assessed by direct observation by this Nurse Health Advisor. No abnormalities found.       6CIT Screen 06/30/2018 05/30/2016  What Year? 0 points 0 points  What month? 0 points 0 points  What time? 0 points 0 points  Count back from 20 0 points 0 points  Months in reverse 0 points 0 points  Repeat phrase 0 points 4 points  Total Score 0  4    Immunizations Immunization History  Administered Date(s) Administered   Fluad Quad(high Dose 65+) 04/24/2021   H1N1 04/25/2008   Influenza Inj Mdck Quad Pf 04/10/2018   Influenza Split 07/20/2007   Influenza, Seasonal, Injecte, Preservative Fre 04/25/2008   Influenza,inj,Quad PF,6+ Mos 04/05/2019   Influenza-Unspecified 04/23/2013, 03/20/2017, 04/10/2018   PNEUMOCOCCAL CONJUGATE-20 04/24/2021   Pneumococcal Conjugate-13 05/11/2014   Tdap 09/21/2015, 09/03/2016   Zoster, Live 01/13/2011    TDAP status: Up to date  Flu Vaccine status: Up to date  Pneumococcal vaccine status: Up to date  Covid-19 vaccine status: Declined, Education has been provided regarding the importance of this vaccine but patient still declined. Advised may receive this vaccine at local pharmacy or Health Dept.or vaccine clinic. Aware to provide a copy of the vaccination record if obtained from local pharmacy or Health Dept. Verbalized acceptance and understanding.  Qualifies for Shingles Vaccine? Yes   Zostavax completed Yes   Shingrix Completed?: No.    Education has been provided regarding the importance of this vaccine. Patient has been advised to call insurance company to determine out of pocket expense if they have not yet received this vaccine. Advised may also receive vaccine at local pharmacy or Health Dept. Verbalized acceptance and understanding.  Screening Tests Health Maintenance  Topic Date Due   COVID-19 Vaccine (1) Never done   Zoster Vaccines- Shingrix (1 of 2) Never done   MAMMOGRAM  06/22/2020   COLONOSCOPY (Pts 45-23yrs Insurance coverage will need to be confirmed)  01/10/2023   DEXA SCAN  01/03/2024   TETANUS/TDAP  09/04/2026   Pneumonia Vaccine 80+ Years old  Completed   INFLUENZA VACCINE  Completed   Hepatitis C Screening  Completed   HPV VACCINES  Aged Out    Health Maintenance  Health Maintenance Due  Topic Date Due   COVID-19 Vaccine (1) Never done   Zoster Vaccines-  Shingrix (1 of 2) Never done   MAMMOGRAM  06/22/2020    Colorectal cancer screening: Type of screening: Colonoscopy. Completed 01/10/20. Repeat every 3 years  Mammogram status: Ordered 07/15/21. Pt provided with contact info and advised to call to schedule appt.   Bone Density status: Completed 01/03/19. Results reflect: Bone density results: NORMAL. Repeat every 5 years.  Lung Cancer Screening: (Low Dose CT Chest recommended if Age 67-80 years, 30 pack-year currently smoking OR have quit w/in 15years.) does not qualify.   Additional Screening:  Hepatitis C Screening: does qualify; Completed 04/08/10  Vision Screening: Recommended annual ophthalmology exams for early detection of glaucoma and other disorders of the eye. Is the patient up to date with their annual eye exam?  Yes  Who is the provider or what is the name of the office in which the patient attends  annual eye exams? Dr.Bulakowski If pt is not established with a provider, would they like to be referred to a provider to establish care? No .   Dental Screening: Recommended annual dental exams for proper oral hygiene  Community Resource Referral / Chronic Care Management: CRR required this visit?  No   CCM required this visit?  No      Plan:     I have personally reviewed and noted the following in the patients chart:   Medical and social history Use of alcohol, tobacco or illicit drugs  Current medications and supplements including opioid prescriptions.  Functional ability and status Nutritional status Physical activity Advanced directives List of other physicians Hospitalizations, surgeries, and ER visits in previous 12 months Vitals Screenings to include cognitive, depression, and falls Referrals and appointments  In addition, I have reviewed and discussed with patient certain preventive protocols, quality metrics, and best practice recommendations. A written personalized care plan for preventive services as  well as general preventive health recommendations were provided to patient.     Dionisio David, LPN   [id number]/[id number]   Nurse Notes: none

## 2021-07-23 ENCOUNTER — Other Ambulatory Visit: Payer: Self-pay | Admitting: Family Medicine

## 2021-07-23 NOTE — Telephone Encounter (Signed)
Medication Refill - Medication: Oxycodone 15 mg  Has the patient contacted their pharmacy? No. (Agent: If no, request that the patient contact the pharmacy for the refill. If patient does not wish to contact the pharmacy document the reason why and proceed with request.) (Agent: If yes, when and what did the pharmacy advise?)  Preferred Pharmacy (with phone number or street name): [facility] Has the patient been seen for an appointment in the last year OR does the patient have an upcoming appointment? Yes.    Agent: Please be advised that RX refills may take up to 3 business days. We ask that you follow-up with your pharmacy.

## 2021-07-23 NOTE — Telephone Encounter (Signed)
Requested medication (s) are due for refill today:   Provider to review  Requested medication (s) are on the active medication list:   Yes  Future visit scheduled:   No   Last ordered: 06/21/2021 #180, 0 refills  Non delegated refill   Requested Prescriptions  Pending Prescriptions Disp Refills   oxyCODONE (ROXICODONE) 15 MG immediate release tablet 180 tablet 0    Sig: TAKE 1-2 TABLETS BY MOUTH EVERY 4 HOURS FOR PAIN.     Not Delegated - Analgesics:  Opioid Agonists Failed - 07/23/2021 12:53 PM      Failed - This refill cannot be delegated      Failed - Urine Drug Screen completed in last 360 days      Passed - Valid encounter within last 3 months    Recent Outpatient Visits           5 months ago Restless legs syndrome   [facility] Birdie Sons, MD   12 months ago Essential hypertension   [facility] Birdie Sons, MD   1 year ago Vitamin D deficiency   [facility] Birdie Sons, MD   3 years ago Annual physical exam   [facility] Birdie Sons, MD   4 years ago Elevated blood pressure reading   [facility] Birdie Sons, MD       Future Appointments             In 3 months Ralene Bathe, MD [place]

## 2021-07-24 ENCOUNTER — Other Ambulatory Visit: Payer: Self-pay | Admitting: Family Medicine

## 2021-07-24 NOTE — Telephone Encounter (Signed)
Copied from [place] [id number]. Topic: Quick Communication - Rx Refill/Question >> Jul 24, 2021  2:21 PM Lenon Curt, Everette A wrote: Medication: oxyCODONE (ROXICODONE) 15 MG immediate release tablet [[id number]]   Has the patient contacted their pharmacy? Yes.  The patient has been directed to contact their PCP (Agent: If no, request that the patient contact the pharmacy for the refill. If patient does not wish to contact the pharmacy document the reason why and proceed with request.) (Agent: If yes, when and what did the pharmacy advise?)  Preferred Pharmacy (with phone number or street name): [place] [place] [address] [place] [address] Phone: [phone] Fax: [phone]  Has the patient been seen for an appointment in the last year OR does the patient have an upcoming appointment? Yes.    Agent: Please be advised that RX refills may take up to 3 business days. We ask that you follow-up with your pharmacy.

## 2021-07-24 NOTE — Telephone Encounter (Signed)
Requested medication (s) are due for refill today: yes  Requested medication (s) are on the active medication list: yes  Last refill:  06/21/21 #180 for 0 RF  Future visit scheduled: no  Notes to clinic:  This medication can not be delegated, please assess.    Requested Prescriptions  Pending Prescriptions Disp Refills   oxyCODONE (ROXICODONE) 15 MG immediate release tablet 180 tablet 0    Sig: TAKE 1-2 TABLETS BY MOUTH EVERY 4 HOURS FOR PAIN.     Not Delegated - Analgesics:  Opioid Agonists Failed - 07/24/2021  3:42 PM      Failed - This refill cannot be delegated      Failed - Urine Drug Screen completed in last 360 days      Passed - Valid encounter within last 3 months    Recent Outpatient Visits           5 months ago Restless legs syndrome   [facility] Birdie Sons, MD   12 months ago Essential hypertension   [facility] Birdie Sons, MD   1 year ago Vitamin D deficiency   [facility] Birdie Sons, MD   3 years ago Annual physical exam   [facility] Birdie Sons, MD   4 years ago Elevated blood pressure reading   [facility] Birdie Sons, MD       Future Appointments             In 3 months Ralene Bathe, MD [place]

## 2021-07-25 MED ORDER — OXYCODONE HCL 15 MG PO TABS: ORAL_TABLET | ORAL | 0 refills | Status: DC

## 2021-08-12 ENCOUNTER — Other Ambulatory Visit: Payer: Self-pay | Admitting: Family Medicine

## 2021-08-12 DIAGNOSIS — E7800 Pure hypercholesterolemia, unspecified: Secondary | ICD-10-CM

## 2021-08-21 ENCOUNTER — Other Ambulatory Visit: Payer: Self-pay | Admitting: Family Medicine

## 2021-08-21 NOTE — Telephone Encounter (Signed)
She is due for follow up office visit and needs to schedule before refills can be approved.  ?

## 2021-08-21 NOTE — Telephone Encounter (Signed)
Requested medication (s) are due for refill today:   Provider to review for both ? ?Requested medication (s) are on the active medication list:   Yes for both ? ?Future visit scheduled:   No ? ? ?Last ordered: Oxycodone 07/25/2021 #180, 0 refills;   Flexeril 08/24/2020  ? ?Returned because both are non delegated refills per protocol.  ? ?Requested Prescriptions  ?Pending Prescriptions Disp Refills  ? oxyCODONE (ROXICODONE) 15 MG immediate release tablet 180 tablet 0  ?  Sig: TAKE 1-2 TABLETS BY MOUTH EVERY 4 HOURS FOR PAIN.  ?  ? Not Delegated - Analgesics:  Opioid Agonists Failed - 08/21/2021 12:38 PM  ?  ?  Failed - This refill cannot be delegated  ?  ?  Failed - Urine Drug Screen completed in last 360 days  ?  ?  Passed - Valid encounter within last 3 months  ?  Recent Outpatient Visits   ? ?      ? 6 months ago Restless legs syndrome  ? [facility] Caryn Section, Kirstie Peri, MD  ? 1 year ago Essential hypertension  ? [facility] Caryn Section, Kirstie Peri, MD  ? 1 year ago Vitamin D deficiency  ? [facility] Caryn Section, Kirstie Peri, MD  ? 3 years ago Annual physical exam  ? [facility] Birdie Sons, MD  ? 4 years ago Elevated blood pressure reading  ? [facility] Caryn Section, Kirstie Peri, MD  ? ?  ?  ?Future Appointments   ? ?        ? In 2 months Ralene Bathe, MD [place]  ? ?  ? ?  ?  ?  ? cyclobenzaprine (FLEXERIL) 10 MG tablet 90 tablet 5  ?  Sig: Take 1 tablet (10 mg total) by mouth 3 (three) times daily as needed for muscle spasms.  ?  ? Not Delegated - Analgesics:  Muscle Relaxants Failed - 08/21/2021 12:38 PM  ?  ?  Failed - This refill cannot be delegated  ?  ?  Passed - Valid encounter within last 6 months  ?  Recent Outpatient Visits   ? ?      ? 6 months ago Restless legs syndrome  ? [facility] Caryn Section, Kirstie Peri, MD  ? 1 year ago Essential hypertension  ? [facility] Caryn Section, Kirstie Peri, MD  ? 1 year ago Vitamin D  deficiency  ? [facility] Caryn Section, Kirstie Peri, MD  ? 3 years ago Annual physical exam  ? [facility] Birdie Sons, MD  ? 4 years ago Elevated blood pressure reading  ? [facility] Caryn Section, Kirstie Peri, MD  ? ?  ?  ?Future Appointments   ? ?        ? In 2 months Ralene Bathe, MD [place]  ? ?  ? ?  ?  ?  ? ?

## 2021-08-21 NOTE — Telephone Encounter (Signed)
Copied from [place] [id number]. Topic: Quick Communication - Rx Refill/Question ?>> Aug 21, 2021 10:40 AM Tessa Lerner A wrote: ?Medication: oxyCODONE (ROXICODONE) 15 MG immediate release tablet [[id number]]  ? ?cyclobenzaprine (FLEXERIL) 10 MG tablet [[id number]]  ? ?Has the patient contacted their pharmacy? No. ?(Agent: If no, request that the patient contact the pharmacy for the refill. If patient does not wish to contact the pharmacy document the reason why and proceed with request.) ?(Agent: If yes, when and what did the pharmacy advise?) ? ?Preferred Pharmacy (with phone number or street name): [place], [place] - [place] ?[place] [address] ?Phone: [phone] Fax: [phone] ?Hours: Not open 24 hours ? ? ?Has the patient been seen for an appointment in the last year OR does the patient have an upcoming appointment? Yes.  ? ?Agent: Please be advised that RX refills may take up to 3 business days. We ask that you follow-up with your pharmacy. ?

## 2021-08-22 ENCOUNTER — Telehealth: Payer: Self-pay | Admitting: Family Medicine

## 2021-08-23 MED ORDER — CYCLOBENZAPRINE HCL 10 MG PO TABS: 10.0000 mg | ORAL_TABLET | Freq: Three times a day (TID) | ORAL | 5 refills | Status: DC | PRN

## 2021-08-23 NOTE — Telephone Encounter (Signed)
Overdue for office visit. Needs to schedule appt this month before refill can be approved.  ?

## 2021-08-23 NOTE — Telephone Encounter (Signed)
Scheduled appt 08/26/21. KW ?

## 2021-08-26 ENCOUNTER — Other Ambulatory Visit: Payer: Self-pay

## 2021-08-26 ENCOUNTER — Ambulatory Visit (INDEPENDENT_AMBULATORY_CARE_PROVIDER_SITE_OTHER): Payer: 59 | Admitting: Family Medicine

## 2021-08-26 ENCOUNTER — Ambulatory Visit: Payer: 59 | Admitting: Family Medicine

## 2021-08-26 ENCOUNTER — Encounter: Payer: Self-pay | Admitting: Family Medicine

## 2021-08-26 VITALS — BP 139/83 | HR 85 | Temp 98.0°F | Resp 16 | Wt 185.3 lb

## 2021-08-26 DIAGNOSIS — M5416 Radiculopathy, lumbar region: Secondary | ICD-10-CM

## 2021-08-26 DIAGNOSIS — I10 Essential (primary) hypertension: Secondary | ICD-10-CM

## 2021-08-26 DIAGNOSIS — F419 Anxiety disorder, unspecified: Secondary | ICD-10-CM

## 2021-08-26 DIAGNOSIS — F1190 Opioid use, unspecified, uncomplicated: Secondary | ICD-10-CM | POA: Diagnosis not present

## 2021-08-26 DIAGNOSIS — G8929 Other chronic pain: Secondary | ICD-10-CM

## 2021-08-26 DIAGNOSIS — E785 Hyperlipidemia, unspecified: Secondary | ICD-10-CM

## 2021-08-26 DIAGNOSIS — M5137 Other intervertebral disc degeneration, lumbosacral region: Secondary | ICD-10-CM

## 2021-08-26 DIAGNOSIS — M51379 Other intervertebral disc degeneration, lumbosacral region without mention of lumbar back pain or lower extremity pain: Secondary | ICD-10-CM

## 2021-08-26 DIAGNOSIS — K760 Fatty (change of) liver, not elsewhere classified: Secondary | ICD-10-CM

## 2021-08-26 DIAGNOSIS — E559 Vitamin D deficiency, unspecified: Secondary | ICD-10-CM

## 2021-08-26 MED ORDER — SERTRALINE HCL 25 MG PO TABS: ORAL_TABLET | ORAL | 1 refills | Status: DC

## 2021-08-26 NOTE — Patient Instructions (Addendum)
Please review the attached list of medications and notify my office if there are any errors.  ? ?Please go to the lab draw station in [address] on the second floor of [facility]  when you are fasting for 8 hours. Normal hours are 8:00am to 11:30am and 1:00pm to 4:00pm Monday through Friday  ? ?The CDC recommends two doses of Shingrix (the shingles vaccine) separated by 2 to 6 months for adults age 67 years and older. I recommend checking with your insurance plan regarding coverage for this vaccine.   ?

## 2021-08-26 NOTE — Progress Notes (Signed)
Established patient visit   Patient: Courtney Ross   DOB: 1955-12-08   66 y.o. Female  MRN: [id number] Visit Date: 08/26/2021  Today's healthcare provider: Lelon Huh, MD   Chief Complaint  Patient presents with   follow-up chronic disease   Subjective    HPI  Follow up for chronic pain  The patient was last seen for this year ago.  Changes made at last visit include continue current treatment. Reports that she is doing well on the medication. -----------------------------------------------------------------------------------------  Hypertension, follow-up  BP Readings from Last 3 Encounters:  08/26/21 139/83  03/04/21 115/75  02/11/21 (!) 129/91   Wt Readings from Last 3 Encounters:  08/26/21 185 lb 4.8 oz (84.1 kg)  03/04/21 193 lb (87.5 kg)  02/11/21 195 lb (88.5 kg)     She was last seen for hypertension 1 years ago.  BP at that visit was n/a video visit. Management since that visit includes none.Doing very well with current medications and wishes to continue without change. She is not currently on any BP medicines  She reports excellent compliance with treatment. She is not having side effects.  She does not smoke.   Outside blood pressures are good at home, can't remember the numbers but her machine read normal. Symptoms: No chest pain No chest pressure  No palpitations No syncope  No dyspnea No orthopnea  No paroxysmal nocturnal dyspnea No lower extremity edema   Lipid/Cholesterol, follow-up  Last Lipid Panel: Lab Results  Component Value Date   CHOL 203 (H) 08/01/2020   LDLCALC 134 (H) 08/01/2020   HDL 47 08/01/2020   TRIG 124 08/01/2020    She was last seen for this 1 years ago.  Management since that visit includes continue current medications.  She reports excellent compliance with treatment. She is not having side effects.   Symptoms: Yes appetite changes No foot ulcerations  No chest pain No chest pressure/discomfort  No  dyspnea No orthopnea  No fatigue No lower extremity edema  No palpitations No paroxysmal nocturnal dyspnea  No nausea No numbness or tingling of extremity  No polydipsia No polyuria  No speech difficulty No syncope   She is following a  well balanced  diet. Current exercise: walking  Last metabolic panel Lab Results  Component Value Date   GLUCOSE 88 08/01/2020   NA 141 08/01/2020   K 4.1 08/01/2020   BUN 15 08/01/2020   CREATININE 0.90 08/01/2020   GFRNONAA 67 08/01/2020   CALCIUM 8.8 08/01/2020   AST 18 08/01/2020   ALT 18 08/01/2020   The 10-year ASCVD risk score (Arnett DK, et al., 2019) is: 7.7%  ---------------------------------------------------------------------------------------------------  Anxiety and Depression, Follow-up  She was last seen for anxiety 1 years ago. Changes made at last visit include "Doing very well with current medications and wishes to continue without change."   She reports excellent compliance with treatment. She reports excellent tolerance of treatment. She is not having side effects.   She feels her anxiety is mild to moderate and Improved since last visit.  Symptoms: No chest pain No difficulty concentrating  No dizziness No fatigue  No feelings of losing control Yes insomnia  Yes irritable No palpitations  Yes panic attacks No racing thoughts  No shortness of breath No sweating  No tremors/shakes    GAD-7 Results GAD-7 Generalized Anxiety Disorder Screening Tool 08/26/2021  1. Feeling Nervous, Anxious, or on Edge 1  2. Not Being Able to Stop or Control  Worrying 1  3. Worrying Too Much About Different Things 1  4. Trouble Relaxing 0  5. Being So Restless it's Hard To Sit Still 0  6. Becoming Easily Annoyed or Irritable 1  7. Feeling Afraid As If Something Awful Might Happen 0  Total GAD-7 Score 4  Difficulty At Work, Home, or Getting  Along With Others? Somewhat difficult    PHQ-9 Scores PHQ9 SCORE ONLY 08/26/2021 07/15/2021  02/18/2021  PHQ-9 Total Score 9 0 3   ---------------------------------------------------------------------------------------------------  GERD, Follow up:  The patient was last seen for GERD 6 months ago. Changes made since that visit include none. Continue current medication  She reports excellent compliance with treatment. She is not having side effects. .  She IS experiencing heartburn. She is NOT experiencing abdominal bloating, belching, chest pain, choking on food, deep pressure at base of neck, difficulty swallowing, or hoarseness  -----------------------------------------------------------------------------------------  Follow up for Restless Leg Syndrome  The patient was last seen for this 6 months ago. Changes made at last visit include "No longer controlled with ropinirole, change to  pramipexole (MIRAPEX) 0.25 MG tablet."  She reports excellent compliance with treatment. She feels that condition is Improved. She is not having side effects.   -----------------------------------------------------------------------------------------  Medications: Outpatient Medications Prior to Visit  Medication Sig   aspirin EC 81 MG tablet Take 81 mg by mouth at bedtime.   buPROPion (WELLBUTRIN SR) 150 MG 12 hr tablet TAKE 1 TABLET BY MOUTH 3  TIMES DAILY   Cholecalciferol (VITAMIN D3) 5000 UNITS CAPS Take 5,000 Units by mouth daily.    Coenzyme Q10 200 MG capsule Take 200 mg by mouth daily.   cyclobenzaprine (FLEXERIL) 10 MG tablet Take 1 tablet (10 mg total) by mouth 3 (three) times daily as needed for muscle spasms.   diazepam (VALIUM) 10 MG tablet TAKE ONE TABLET EVERY 6 HOURS AS NEEDED FOR ANXIETY   diclofenac Sodium (VOLTAREN) 1 % GEL SMARTSIG:1 Gram(s) Topical 4 Times Daily PRN   estradiol (ESTRACE) 1 MG tablet TAKE 1 TABLET BY MOUTH  DAILY   fluticasone (FLONASE) 50 MCG/ACT nasal spray Place 1 spray into both nostrils daily as needed for allergies or rhinitis.   hydroquinone  4 % cream Apply 1 application topically daily.   LINZESS 145 MCG CAPS capsule TAKE 1 CAPSULE BY MOUTH  DAILY   meloxicam (MOBIC) 15 MG tablet Take 1 tablet (15 mg total) by mouth at bedtime as needed for pain.   Multiple Vitamin (MULTIVITAMIN WITH MINERALS) TABS tablet Take 1 tablet by mouth daily.   mupirocin cream (BACTROBAN) 2 % APPLY TO AFFECTED AREAS TOPICALLY TWICE DAILY   MYRBETRIQ 25 MG TB24 tablet TAKE 1 TABLET BY MOUTH DAILY.   omeprazole (PRILOSEC) 40 MG capsule Take 1 capsule (40 mg total) by mouth daily as needed (heartburn).   oxyCODONE (ROXICODONE) 15 MG immediate release tablet TAKE ONE TO TWO TABLETS BY MOUTH EVERY 4HOURS FOR PAIN   polyethylene glycol (MIRALAX / GLYCOLAX) packet Take 17 g by mouth 2 (two) times daily as needed for mild constipation (dissolve in an 8 oz glass of water).   pramipexole (MIRAPEX) 0.25 MG tablet TAKE 1/2 TABLET 1 HOUR  BEFORE BEDTIME FOR 4 DAYS,  THEN INCREASE TO 1 TAB  BEFORE BEDTIME FOR 4 DAYS,  THEN 2 TABS BEFORE BEDTIME   pregabalin (LYRICA) 150 MG capsule Take 150 mg by mouth 2 (two) times daily.   RESTASIS 0.05 % ophthalmic emulsion Place 1 drop into both eyes 2 (two)  times daily.    rosuvastatin (CRESTOR) 10 MG tablet TAKE 1 TABLET BY MOUTH  DAILY   scopolamine (TRANSDERM-SCOP) 1 MG/3DAYS Transderm-Scop 1 mg over 3 days transdermal patch   Turmeric 500 MG CAPS Take 500 mg by mouth daily.    mirabegron ER (MYRBETRIQ) 25 MG TB24 tablet Take by mouth.   phentermine 37.5 MG capsule Take 1 capsule (37.5 mg total) by mouth every morning.   pregabalin (LYRICA) 75 MG capsule Take 1 capsule (75 mg total) by mouth 3 (three) times daily.   No facility-administered medications prior to visit.         Objective    BP 139/83 (BP Location: Right Arm, Patient Position: Sitting, Cuff Size: Normal)    Pulse 85    Temp 98 F (36.7 C) (Oral)    Resp 16    Wt 185 lb 4.8 oz (84.1 kg)    BMI 28.17 kg/m    Physical Exam   General: Appearance:      Mildly obese female in no acute distress  Eyes:    PERRL, conjunctiva/corneas clear, EOM's intact       Lungs:     Clear to auscultation bilaterally, respirations unlabored  Heart:    Normal heart rate. Normal rhythm. No murmurs, rubs, or gallops.    MS:   All extremities are intact.    Neurologic:   Awake, alert, oriented x 3. No apparent focal neurological defect.         No results found for any visits on 08/26/21.  Assessment & Plan     1. Chronic lumbar radicular pain (Right side)   2. Degeneration of lumbar or lumbosacral intervertebral disc Chronic pain fairly well controlled on current medications.   3. Opiate use  - Pain Mgt Scrn (14 Drugs), Ur  4. Essential hypertension Well controlled.  Continue current medications.   - CBC  5. Anxiety She feels she needs something in addition to bupropion whish she currently take '450mg'$  daily.  - sertraline (ZOLOFT) 25 MG tablet; Take 1/2 tablet daily for 6 days, then increase to 1 tablet daily for 6 days, then increase to 2 tablets daily  Dispense: 30 tablet; Refill: 1  6. Vitamin D deficiency  - VITAMIN D 25 Hydroxy (Vit-D Deficiency, Fractures)  7. Fatty metamorphosis of liver   8. Dyslipidemia She is tolerating rosuvastatin well with no adverse effects.   - Comprehensive metabolic panel - Lipid panel - TSH       The entirety of the information documented in the History of Present Illness, Review of Systems and Physical Exam were personally obtained by me. Portions of this information were initially documented by the CMA and reviewed by me for thoroughness and accuracy.     Lelon Huh, MD  [facility] [phone] (phone) [phone] (fax)  [place]

## 2021-08-28 LAB — CBC
Hematocrit: 43.8 % (ref 34.0–46.6)
Hemoglobin: 14.3 g/dL (ref 11.1–15.9)
MCH: 25.2 pg — ABNORMAL LOW (ref 26.6–33.0)
MCHC: 32.6 g/dL (ref 31.5–35.7)
MCV: 77 fL — ABNORMAL LOW (ref 79–97)
Platelets: 287 10*3/uL (ref 150–450)
RBC: 5.68 x10E6/uL — ABNORMAL HIGH (ref 3.77–5.28)
RDW: 14 % (ref 11.7–15.4)
WBC: 5.4 10*3/uL (ref 3.4–10.8)

## 2021-08-28 LAB — COMPREHENSIVE METABOLIC PANEL
ALT: 34 IU/L — ABNORMAL HIGH (ref 0–32)
AST: 31 IU/L (ref 0–40)
Albumin/Globulin Ratio: 1.8 (ref 1.2–2.2)
Albumin: 4.5 g/dL (ref 3.8–4.8)
Alkaline Phosphatase: 111 IU/L (ref 44–121)
BUN/Creatinine Ratio: 21 (ref 12–28)
BUN: 20 mg/dL (ref 8–27)
Bilirubin Total: 0.8 mg/dL (ref 0.0–1.2)
CO2: 22 mmol/L (ref 20–29)
Calcium: 9.8 mg/dL (ref 8.7–10.3)
Chloride: 102 mmol/L (ref 96–106)
Creatinine, Ser: 0.97 mg/dL (ref 0.57–1.00)
Globulin, Total: 2.5 g/dL (ref 1.5–4.5)
Glucose: 89 mg/dL (ref 70–99)
Potassium: 4.6 mmol/L (ref 3.5–5.2)
Sodium: 140 mmol/L (ref 134–144)
Total Protein: 7 g/dL (ref 6.0–8.5)
eGFR: 64 mL/min/{1.73_m2} (ref >59)

## 2021-08-28 LAB — PAIN MGT SCRN (14 DRUGS), UR
Amphetamine Scrn, Ur: NEGATIVE ng/mL
BARBITURATE SCREEN URINE: NEGATIVE ng/mL
BENZODIAZEPINE SCREEN, URINE: POSITIVE ng/mL — AB
Buprenorphine, Urine: NEGATIVE ng/mL
CANNABINOIDS UR QL SCN: NEGATIVE ng/mL
Cocaine (Metab) Scrn, Ur: NEGATIVE ng/mL
Creatinine(Crt), U: 193 mg/dL (ref 20.0–300.0)
Fentanyl, Urine: NEGATIVE pg/mL
Meperidine Screen, Urine: NEGATIVE ng/mL
Methadone Screen, Urine: NEGATIVE ng/mL
OXYCODONE+OXYMORPHONE UR QL SCN: POSITIVE ng/mL — AB
Opiate Scrn, Ur: NEGATIVE ng/mL
Ph of Urine: 5.6 (ref 4.5–8.9)
Phencyclidine Qn, Ur: NEGATIVE ng/mL
Propoxyphene Scrn, Ur: NEGATIVE ng/mL
Tramadol Screen, Urine: NEGATIVE ng/mL

## 2021-08-28 LAB — LIPID PANEL
Chol/HDL Ratio: 3.2 ratio (ref 0.0–4.4)
Cholesterol, Total: 230 mg/dL — ABNORMAL HIGH (ref 100–199)
HDL: 72 mg/dL (ref >39)
LDL Chol Calc (NIH): 143 mg/dL — ABNORMAL HIGH (ref 0–99)
Triglycerides: 84 mg/dL (ref 0–149)
VLDL Cholesterol Cal: 15 mg/dL (ref 5–40)

## 2021-08-28 LAB — VITAMIN D 25 HYDROXY (VIT D DEFICIENCY, FRACTURES): Vit D, 25-Hydroxy: 43.4 ng/mL (ref 30.0–100.0)

## 2021-08-28 LAB — TSH: TSH: 2.62 u[IU]/mL (ref 0.450–4.500)

## 2021-09-18 ENCOUNTER — Other Ambulatory Visit: Payer: Self-pay | Admitting: Family Medicine

## 2021-09-18 DIAGNOSIS — F419 Anxiety disorder, unspecified: Secondary | ICD-10-CM

## 2021-09-18 NOTE — Telephone Encounter (Signed)
Copied from [place] [id number]. Topic: Quick Communication - Rx Refill/Question ?>> Sep 18, 2021 12:27 PM Lenon Curt, [place] A wrote: ?Medication: sertraline (ZOLOFT) 25 MG tablet [[id number]]  ? ?Has the patient contacted their pharmacy? No.The patient was uncertain of the med's status  ?(Agent: If no, request that the patient contact the pharmacy for the refill. If patient does not wish to contact the pharmacy document the reason why and proceed with request.) ?(Agent: If yes, when and what did the pharmacy advise?) ? ?Preferred Pharmacy (with phone number or street name): [facility] ([facility] ) - [place], [address] ?[place] [address] ?Phone: [phone] Fax: [phone] ?Hours: Not open 24 hours ? ? ?Has the patient been seen for an appointment in the last year OR does the patient have an upcoming appointment? Yes.   ? ?Agent: Please be advised that RX refills may take up to 3 business days. We ask that you follow-up with your pharmacy. ?

## 2021-09-19 MED ORDER — SERTRALINE HCL 25 MG PO TABS: ORAL_TABLET | ORAL | 1 refills | Status: DC

## 2021-09-19 NOTE — Telephone Encounter (Signed)
Requested medications are due for refill today.  yes ? ?Requested medications are on the active medications list.  yes ? ?Last refill. 08/26/2021 #30 1 refill ? ?Future visit scheduled.   no ? ?Notes to clinic.  This is a new medication for the pt.  Please review current sig for maintenance instructions. ? ? ? ?Requested Prescriptions  ?Pending Prescriptions Disp Refills  ? sertraline (ZOLOFT) 25 MG tablet 30 tablet 1  ?  Sig: Take 1/2 tablet daily for 6 days, then increase to 1 tablet daily for 6 days, then increase to 2 tablets daily  ?  ? Not Delegated - Psychiatry:  Antidepressants - SSRI - sertraline Failed - 09/18/2021  5:05 PM  ?  ?  Failed - This refill cannot be delegated  ?  ?  Failed - ALT in normal range and within 360 days  ?  ALT  ?Date Value Ref Range Status  ?08/27/2021 34 (H) 0 - 32 IU/L Final  ?  ?  ?  ?  Passed - AST in normal range and within 360 days  ?  AST  ?Date Value Ref Range Status  ?08/27/2021 31 0 - 40 IU/L Final  ?  ?  ?  ?  Passed - Completed PHQ-2 or PHQ-9 in the last 360 days  ?  ?  Passed - Valid encounter within last 6 months  ?  Recent Outpatient Visits   ? ?      ? 3 weeks ago Chronic lumbar radicular pain (Right side)  ? [facility] Birdie Sons, MD  ? 7 months ago Restless legs syndrome  ? [facility] Caryn Section, Kirstie Peri, MD  ? 1 year ago Essential hypertension  ? [facility] Caryn Section, Kirstie Peri, MD  ? 1 year ago Vitamin D deficiency  ? [facility] Caryn Section, Kirstie Peri, MD  ? 3 years ago Annual physical exam  ? [facility] Birdie Sons, MD  ? ?  ?  ?Future Appointments   ? ?        ? In 1 month Ralene Bathe, MD [place]  ? ?  ? ?  ?  ?  ?  ?

## 2021-09-23 ENCOUNTER — Other Ambulatory Visit: Payer: Self-pay | Admitting: Family Medicine

## 2021-09-23 NOTE — Telephone Encounter (Signed)
Medication Refill - Medication: oxyCODONE (ROXICODONE) 15 MG immediate release tablet   Has the patient contacted their pharmacy? No. (Agent: If no, request that the patient contact the pharmacy for the refill. If patient does not wish to contact the pharmacy document the reason why and proceed with request.) (Agent: If yes, when and what did the pharmacy advise?)  Preferred Pharmacy (with phone number or street name): [facility] - [place], [place] - [address], [place] [address] Phone: [phone]  Fax: [phone]  Has the patient been seen for an appointment in the last year OR does the patient have an upcoming appointment? Yes.    Agent: Please be advised that RX refills may take up to 3 business days. We ask that you follow-up with your pharmacy.  

## 2021-09-24 ENCOUNTER — Other Ambulatory Visit: Payer: Self-pay | Admitting: Family Medicine

## 2021-09-24 ENCOUNTER — Ambulatory Visit
Admission: RE | Admit: 2021-09-24 | Discharge: 2021-09-24 | Disposition: A | Discharge status: Home / Self Care | Payer: 59 | Source: Ambulatory Visit | Attending: Family Medicine | Admitting: Family Medicine

## 2021-09-24 DIAGNOSIS — Z1231 Encounter for screening mammogram for malignant neoplasm of breast: Secondary | ICD-10-CM | POA: Insufficient documentation

## 2021-09-24 DIAGNOSIS — F419 Anxiety disorder, unspecified: Secondary | ICD-10-CM

## 2021-09-24 MED ORDER — OXYCODONE HCL 15 MG PO TABS: ORAL_TABLET | ORAL | 0 refills | Status: DC

## 2021-09-24 NOTE — Telephone Encounter (Signed)
Requested medication (s) are due for refill today: Yes ? ?Requested medication (s) are on the active medication list: Yes ? ?Last refill:  09/19/21 ? ?Future visit scheduled: Yes ? ?Notes to clinic:  Unable to refill per protocol, cannot delegate. Will need new Rx to reflect 2 tabs, see patient request in notes ? ? ? ? ? ?Requested Prescriptions  ?Pending Prescriptions Disp Refills  ? sertraline (ZOLOFT) 25 MG tablet 30 tablet 1  ?  Sig: Take 1/2 tablet daily for 6 days, then increase to 1 tablet daily for 6 days, then increase to 2 tablets daily  ?  ? Not Delegated - Psychiatry:  Antidepressants - SSRI - sertraline Failed - 09/24/2021  5:45 PM  ?  ?  Failed - This refill cannot be delegated  ?  ?  Failed - ALT in normal range and within 360 days  ?  ALT  ?Date Value Ref Range Status  ?08/27/2021 34 (H) 0 - 32 IU/L Final  ?  ?  ?  ?  Passed - AST in normal range and within 360 days  ?  AST  ?Date Value Ref Range Status  ?08/27/2021 31 0 - 40 IU/L Final  ?  ?  ?  ?  Passed - Completed PHQ-2 or PHQ-9 in the last 360 days  ?  ?  Passed - Valid encounter within last 6 months  ?  Recent Outpatient Visits   ? ?      ? 4 weeks ago Chronic lumbar radicular pain (Right side)  ? [facility] Birdie Sons, MD  ? 7 months ago Restless legs syndrome  ? [facility] Caryn Section, Kirstie Peri, MD  ? 1 year ago Essential hypertension  ? [facility] Caryn Section, Kirstie Peri, MD  ? 1 year ago Vitamin D deficiency  ? [facility] Caryn Section, Kirstie Peri, MD  ? 3 years ago Annual physical exam  ? [facility] Birdie Sons, MD  ? ?  ?  ?Future Appointments   ? ?        ? In 4 weeks Ralene Bathe, MD [place]  ? ?  ? ?  ?  ?  ? ? ? ? ?

## 2021-09-24 NOTE — Telephone Encounter (Signed)
Requested medication (s) are due for refill today: yes ? ?Requested medication (s) are on the active medication list: yes ? ?Last refill:  08/23/21 #180/0 ? ?Future visit scheduled: no ? ?Notes to clinic:  Unable to refill per protocol, cannot delegate. ? ? ?  ?Requested Prescriptions  ?Pending Prescriptions Disp Refills  ? oxyCODONE (ROXICODONE) 15 MG immediate release tablet 180 tablet 0  ?  Sig: TAKE ONE TO TWO TABLETS BY MOUTH EVERY 4HOURS FOR PAIN  ?  ? Not Delegated - Analgesics:  Opioid Agonists Failed - 09/24/2021 11:44 AM  ?  ?  Failed - This refill cannot be delegated  ?  ?  Failed - Urine Drug Screen completed in last 360 days  ?  ?  Passed - Valid encounter within last 3 months  ?  Recent Outpatient Visits   ? ?      ? 4 weeks ago Chronic lumbar radicular pain (Right side)  ? [facility] Birdie Sons, MD  ? 7 months ago Restless legs syndrome  ? [facility] Caryn Section, Kirstie Peri, MD  ? 1 year ago Essential hypertension  ? [facility] Caryn Section, Kirstie Peri, MD  ? 1 year ago Vitamin D deficiency  ? [facility] Caryn Section, Kirstie Peri, MD  ? 3 years ago Annual physical exam  ? [facility] Birdie Sons, MD  ? ?  ?  ?Future Appointments   ? ?        ? In 4 weeks Ralene Bathe, MD [place]  ? ?  ? ?  ?  ?  ? ?

## 2021-09-24 NOTE — Telephone Encounter (Signed)
Courtney Ross (Patient) Courtney Ross (Patient) General - Other  ?Summary: Pt states script written wrong FU  ?Pt states she starting taking this on 3/06. She has already followed the instructions and graduated to the 2 pills a day. Pt kept this fill but states will be needing a refill now as not enough dispensed. Pl  asking   to rewrite script.   ?  ?  ?sertraline (ZOLOFT) 25 MG tablet  ?Medication  ?Date: 09/19/2021 Department: [facility] Ordering/Authorizing: Birdie Sons, MD  ?  ?Order Providers  ?  ?Prescribing Provider Encounter Provider  ?Birdie Sons, MD Birdie Sons, MD  ?  ?Outpatient Medication Detail  ?  ?Disp Refills Start End   ?sertraline (ZOLOFT) 25 MG tablet 30 tablet 1 09/19/2021   ?Sig: Take 1/2 tablet daily for 6 days, then increase to 1 tablet daily for 6 days, then increase to 2 tablets daily   ?Sent to pharmacy as: sertraline (ZOLOFT) 25 MG tablet   ?E-Prescribing Status: Receipt confirmed by pharmacy (09/19/2021  1:46 PM EDT  ? ?

## 2021-09-25 ENCOUNTER — Other Ambulatory Visit: Payer: Self-pay | Admitting: Family Medicine

## 2021-09-25 DIAGNOSIS — N63 Unspecified lump in unspecified breast: Secondary | ICD-10-CM

## 2021-09-25 DIAGNOSIS — R928 Other abnormal and inconclusive findings on diagnostic imaging of breast: Secondary | ICD-10-CM

## 2021-09-26 ENCOUNTER — Telehealth: Payer: Self-pay

## 2021-09-26 NOTE — Telephone Encounter (Signed)
Pt called wanting to know the results from her recent mammogram. The PCP recommendations were not attached to results. Advised that someone will call with results. ?

## 2021-09-28 ENCOUNTER — Other Ambulatory Visit: Payer: Self-pay | Admitting: Family Medicine

## 2021-09-28 DIAGNOSIS — F324 Major depressive disorder, single episode, in partial remission: Secondary | ICD-10-CM

## 2021-09-30 NOTE — Telephone Encounter (Signed)
Mammogram done on 09/24/2021 is incomplete and needs additional imaging. I called and advised patient of this. I gave patient the contact information to [facility] and advised her to call to schedule appointment for additional imaging. Patient verbalized understanding and agreed to call.  ?

## 2021-10-10 ENCOUNTER — Other Ambulatory Visit: Payer: Self-pay | Admitting: Family Medicine

## 2021-10-10 DIAGNOSIS — F419 Anxiety disorder, unspecified: Secondary | ICD-10-CM

## 2021-10-10 NOTE — Telephone Encounter (Signed)
Medication Refill - Medication:  ?sertraline (ZOLOFT) 25 MG tablet - (requesting a 90 day refill) ? ?Has the patient contacted their pharmacy? Yes.   ?Contact PCP ? ?Preferred Pharmacy (with phone number or street name):  ?[facility] ) - [place], [place]  ?[place], [place] [address]  ?Phone:  [phone]  Fax:  [phone]  ? ?Has the patient been seen for an appointment in the last year OR does the patient have an upcoming appointment? Yes.   ? ?Agent: Please be advised that RX refills may take up to 3 business days. We ask that you follow-up with your pharmacy. ?

## 2021-10-11 NOTE — Telephone Encounter (Signed)
Requested medication (s) are due for refill today: has refill, mail order asking for 90 day supply ? ?Requested medication (s) are on the active medication list: yes ? ?Last refill:  09/19/21 #30 with 1 ? ?Future visit scheduled: no, just seen 08/26/21 ? ?Notes to clinic:  This medication can not be delegated, please assess.  ? ? ? ?  ? ?Requested Prescriptions  ?Pending Prescriptions Disp Refills  ? sertraline (ZOLOFT) 25 MG tablet 30 tablet 1  ?  Sig: Take 1/2 tablet daily for 6 days, then increase to 1 tablet daily for 6 days, then increase to 2 tablets daily  ?  ? Not Delegated - Psychiatry:  Antidepressants - SSRI - sertraline Failed - 10/10/2021 12:42 PM  ?  ?  Failed - This refill cannot be delegated  ?  ?  Failed - ALT in normal range and within 360 days  ?  ALT  ?Date Value Ref Range Status  ?08/27/2021 34 (H) 0 - 32 IU/L Final  ?  ?  ?  ?  Passed - AST in normal range and within 360 days  ?  AST  ?Date Value Ref Range Status  ?08/27/2021 31 0 - 40 IU/L Final  ?  ?  ?  ?  Passed - Completed PHQ-2 or PHQ-9 in the last 360 days  ?  ?  Passed - Valid encounter within last 6 months  ?  Recent Outpatient Visits   ? ?      ? 1 month ago Chronic lumbar radicular pain (Right side)  ? [facility] Birdie Sons, MD  ? 7 months ago Restless legs syndrome  ? [facility] Caryn Section, Kirstie Peri, MD  ? 1 year ago Essential hypertension  ? [facility] Caryn Section, Kirstie Peri, MD  ? 1 year ago Vitamin D deficiency  ? [facility] Caryn Section, Kirstie Peri, MD  ? 3 years ago Annual physical exam  ? [facility] Birdie Sons, MD  ? ?  ?  ?Future Appointments   ? ?        ? In 1 week Ralene Bathe, MD [place]  ? ?  ? ? ?  ?  ?  ? ? ?

## 2021-10-12 NOTE — Telephone Encounter (Addendum)
Please check with patient to see if she is taking one or two sertraline a day and how it it working. If taking 2 then we can change '50mg'$  tablet.  ?

## 2021-10-14 MED ORDER — SERTRALINE HCL 50 MG PO TABS: 50.0000 mg | ORAL_TABLET | Freq: Every day | ORAL | 3 refills | Status: DC

## 2021-10-14 NOTE — Telephone Encounter (Signed)
I called and spoke with patient. She states she is taking 2 of the 25 mg tablets daily (totaling 50 mg a day). Patient reports that this dose is working well for her and she would like to change the prescription to a 50 mg tablet daily.  ?

## 2021-10-16 ENCOUNTER — Ambulatory Visit
Admission: RE | Admit: 2021-10-16 | Discharge: 2021-10-16 | Disposition: A | Discharge status: Home / Self Care | Payer: 59 | Source: Ambulatory Visit | Attending: Family Medicine | Admitting: Family Medicine

## 2021-10-16 DIAGNOSIS — R928 Other abnormal and inconclusive findings on diagnostic imaging of breast: Secondary | ICD-10-CM

## 2021-10-16 DIAGNOSIS — N6312 Unspecified lump in the right breast, upper inner quadrant: Secondary | ICD-10-CM | POA: Diagnosis not present

## 2021-10-16 DIAGNOSIS — N63 Unspecified lump in unspecified breast: Secondary | ICD-10-CM | POA: Diagnosis present

## 2021-10-22 ENCOUNTER — Other Ambulatory Visit: Payer: Self-pay | Admitting: Family Medicine

## 2021-10-23 ENCOUNTER — Ambulatory Visit (INDEPENDENT_AMBULATORY_CARE_PROVIDER_SITE_OTHER): Payer: 59 | Admitting: Dermatology

## 2021-10-23 DIAGNOSIS — L7211 Pilar cyst: Secondary | ICD-10-CM

## 2021-10-23 DIAGNOSIS — L988 Other specified disorders of the skin and subcutaneous tissue: Secondary | ICD-10-CM | POA: Diagnosis not present

## 2021-10-23 NOTE — Patient Instructions (Signed)
Courtney Ross ?[place] [address] ? ? ?You have an upcoming surgery appointment scheduled at [facility]. ?'@FUTURESURGAPPT'$ @ ? ?PRE-OPERATIVE INSTRUCTIONS ? ?We recommend you read the following instructions. If you have any questions or concerns, please call the office at [phone]. ? ?Shower and wash the entire body with soap and water the day of your surgery paying special attention to cleansing at and around the planned surgery site. ?Avoid aspirin or aspirin containing products at least ten (10) days prior to your surgical procedure and for at least one week (7 days) after your surgical procedure. If you take aspirin on a regular basis for heart disease or history of stroke or for any other reason, we may recommend you continue taking aspirin but please notify us if you take this on a regular basis. Aspirin can cause more bleeding to occur during surgery as well as prolonged bleeding and bruising after surgery. ?Avoid other nonsteroidal pain medications at least one week prior to surgery and at least one week after your surgery. These include medications such as Ibuprofen (Motrin, Advil and Nuprin), Naprosyn, Voltaren, Relafen, etc. If these medications are used for therapeutic reasons, please inform us as they can cause increased bleeding or prolonged bleeding during and bruising after surgical procedures.  ?Please advise [place] if you are taking any ?blood thinner? medications such as Coumadin or Dipyridamole or Plavix or similar medications. These cause increased bleeding and prolonged bleeding during procedures and bruising after surgical procedures. We may have to consider discontinuing these medications briefly prior to and shortly after your surgery if safe to do so. ?Please inform us of all medications you are currently taking. All medications that are taken regularly should be taken the day of surgery as you always do. Nevertheless, we need to be informed of what medications  you are taking prior to surgery to know whether they will affect the procedure or cause any complications. ?Please inform us of any medication allergies. Also inform us of whether you have allergies to Latex or rubber products or whether you have had any adverse reaction to Lidocaine or Epinephrine. ?Please inform us of any prosthetic or artificial body parts such as artificial heart valve, joint replacements, etc., or similar condition that might require preoperative antibiotics. ?We recommend avoidance of alcohol at least two weeks prior to surgery and continued avoidance for at least two weeks after surgery. ?We recommend avoidance of tobacco smoking at least two weeks prior to surgery and continued abstinence for at least two weeks after surgery. ?Do not plan strenuous exercise, strenuous work or strenuous lifting for approximately four weeks after your surgery. ?We request if you are unable to make your scheduled surgical appointment, please call us at least a week in advance or as soon as you are aware of a problem so that we can cancel or reschedule the appointment. ?You MAY TAKE TYLENOL (acetaminophen) for pain as it is not a blood thinner. ?PLEASE PLAN TO BE IN TOWN FOR TWO WEEKS FOLLOWING SURGERY. THIS IS IMPORTANT SO YOU CAN BE CHECKED FOR DRESSING CHANGES, SUTURE REMOVAL AND TO MONITOR FOR POSSIBLE COMPLICATIONS. ? ? ? ? ?If You Need Anything After Your Visit ? ?If you have any questions or concerns for your doctor, please call our main line at [phone] and press option 4 to reach your doctor's medical assistant. If no one answers, please leave a voicemail as directed and we will return your call as soon as possible. Messages left after 4 pm  will be answered the following business day.  ? ?You may also send [place] a message via MyChart. We typically respond to MyChart messages within 1-2 business days. ? ?For prescription refills, please ask your pharmacy to contact our office. Our fax number is  [phone]. ? ?If you have an urgent issue when the clinic is closed that cannot wait until the next business day, you can page your doctor at the number below.   ? ?Please note that while we do our best to be available for urgent issues outside of office hours, we are not available 24/7.  ? ?If you have an urgent issue and are unable to reach [place], you may choose to seek medical care at your doctor's office, retail clinic, urgent care center, or emergency room. ? ?If you have a medical emergency, please immediately call 911 or go to the emergency department. ? ?Pager Numbers ? ?- Dr. Nehemiah Massed: [phone] ? ?- Dr. Laurence Ferrari: [phone] ? ?- Dr. Nicole Kindred: [phone] ? ?In the event of inclement weather, please call our main line at [phone] for an update on the status of any delays or closures. ? ?Dermatology Medication Tips: ?Please keep the boxes that topical medications come in in order to help keep track of the instructions about where and how to use these. Pharmacies typically print the medication instructions only on the boxes and not directly on the medication tubes.  ? ?If your medication is too expensive, please contact our office at [phone] option 4 or send [place] a message through [place].  ? ?We are unable to tell what your co-pay for medications will be in advance as this is different depending on your insurance coverage. However, we may be able to find a substitute medication at lower cost or fill out paperwork to get insurance to cover a needed medication.  ? ?If a prior authorization is required to get your medication covered by your insurance company, please allow [place] 1-2 business days to complete this process. ? ?Drug prices often vary depending on where the prescription is filled and some pharmacies may offer cheaper prices. ? ?The website www.goodrx.com contains coupons for medications through different pharmacies. The prices here do not account for what the cost may be with help from  insurance (it may be cheaper with your insurance), but the website can give you the price if you did not use any insurance.  ?- You can print the associated coupon and take it with your prescription to the pharmacy.  ?- You may also stop by our office during regular business hours and pick up a GoodRx coupon card.  ?- If you need your prescription sent electronically to a different pharmacy, notify our office through [facility] or by phone at [phone] option 4. ? ? ? ? ?Si Usted Necesita Algo Despu?s de Su Visita ? ?Tambi?n puede enviarnos un mensaje a trav?s de MyChart. Por lo general respondemos a los mensajes de MyChart en el transcurso de 1 a 2 d?as h?biles. ? ?Para renovar recetas, por favor pida a su farmacia que se ponga en contacto con nuestra oficina. Nuestro n?mero de fax es el [phone]. ? ?Si tiene un asunto urgente cuando la cl?nica est? cerrada y que no puede esperar hasta el siguiente d?a h?bil, puede llamar/localizar a su doctor(a) al n?mero que aparece a continuaci?n.  ? ?Por favor, tenga en cuenta que aunque hacemos todo lo posible para estar disponibles para asuntos urgentes fuera del horario de oficina, no estamos disponibles las 24 horas  del d?a, los 7 d?as de la semana.  ? ?Si tiene un problema urgente y no puede comunicarse con nosotros, puede optar por buscar atenci?n m?dica  en el consultorio de su doctor(a), en una cl?nica privada, en un centro de atenci?n urgente o en una sala de emergencias. ? ?Si tiene Engineer, maintenance (IT) m?dica, por favor llame inmediatamente al 911 o vaya a la sala de emergencias. ? ?N?meros de b?per ? ?- Dr. Nehemiah Massed: [phone] ? ?- Dra. Moye: [phone] ? ?- Dra. Nicole Kindred: [phone] ? ?En caso de inclemencias del tiempo, por favor llame a nuestra l?nea principal al [phone] para una actualizaci?n sobre el estado de cualquier retraso o cierre. ? ?Consejos para la medicaci?n en dermatolog?a: ?Por favor, guarde las cajas en las que vienen los  medicamentos de uso t?pico para ayudarle a seguir las instrucciones sobre d?nde y c?mo usarlos. Las farmacias generalmente imprimen las instrucciones del medicamento s?lo en las cajas y no directamente en los tubos del m

## 2021-10-23 NOTE — Progress Notes (Signed)
? ?  New Patient Visit ? ?Subjective  ?Courtney Ross is a 66 y.o. female who presents for the following: Other (New patient - Bump on head x several years - feels like something is crawling ). ?Also wants to discuss Botox. ? ?The following portions of the chart were reviewed this encounter and updated as appropriate:  ? Tobacco  Allergies  Meds  Problems  Med Hx  Surg Hx  Fam Hx   ?  ?Review of Systems:  No other skin or systemic complaints except as noted in HPI or Assessment and Plan. ? ?Objective  ?Well appearing patient in no apparent distress; mood and affect are within normal limits. ? ?A focused examination was performed including scalp, face. Relevant physical exam findings are noted in the Assessment and Plan. ? ?Right mid lat vertex scalp ?2.1 cm cystic papule ? ?Head - Anterior (Face) ?Rhytides and volume loss.  ? ? 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 ?Discussed risk of growth and potential for rupture. Discussed excision. Patient will schedule a surgery appointment. ? ?Elastosis of skin ?Head - Anterior (Face) ? ?Discussed Botox to frown complex. Advised patient she will need 20 units and the fee is $260. Advised patient she will need up to 4 treatments per year. Will plan treatment at post op appointment for pilar cyst. ? ? ?Return for Surgery for pilar cyst. ? ?I, Ashok Cordia, CMA, am acting as scribe for Sarina Ser, MD . ?Documentation: I have reviewed the above documentation for accuracy and completeness, and I agree with the above. ? ?Sarina Ser, MD ? ?

## 2021-11-05 ENCOUNTER — Other Ambulatory Visit: Payer: Self-pay | Admitting: Family Medicine

## 2021-11-05 ENCOUNTER — Encounter: Payer: Self-pay | Admitting: Dermatology

## 2021-11-05 DIAGNOSIS — E7800 Pure hypercholesterolemia, unspecified: Secondary | ICD-10-CM

## 2021-11-22 ENCOUNTER — Other Ambulatory Visit: Payer: Self-pay | Admitting: Family Medicine

## 2021-12-23 ENCOUNTER — Other Ambulatory Visit: Payer: Self-pay | Admitting: Family Medicine

## 2021-12-27 ENCOUNTER — Other Ambulatory Visit: Payer: Self-pay | Admitting: Family Medicine

## 2022-01-07 ENCOUNTER — Telehealth: Payer: Self-pay

## 2022-01-07 ENCOUNTER — Ambulatory Visit (INDEPENDENT_AMBULATORY_CARE_PROVIDER_SITE_OTHER): Payer: 59 | Admitting: Dermatology

## 2022-01-07 DIAGNOSIS — L7211 Pilar cyst: Secondary | ICD-10-CM | POA: Diagnosis not present

## 2022-01-07 DIAGNOSIS — D485 Neoplasm of uncertain behavior of skin: Secondary | ICD-10-CM

## 2022-01-07 MED ORDER — MUPIROCIN 2 % EX OINT: 1.0000 | TOPICAL_OINTMENT | Freq: Every day | CUTANEOUS | 0 refills | Status: DC

## 2022-01-07 NOTE — Telephone Encounter (Signed)
Patient doing fine after today's surgery./sh 

## 2022-01-07 NOTE — Patient Instructions (Signed)
Wound Care Instructions  On the day following your surgery, you should begin doing daily dressing changes: Remove the old dressing and discard it. Cleanse the wound gently with tap water. This may be done in the shower or by placing a wet gauze pad directly on the wound and letting it soak for several minutes. It is important to gently remove any dried blood from the wound in order to encourage healing. This may be done by gently rolling a moistened Q-tip on the dried blood. Do not pick at the wound. If the wound should start to bleed, continue cleaning the wound, then place a moist gauze pad on the wound and hold pressure for a few minutes.  Make sure you then dry the skin surrounding the wound completely or the tape will not stick to the skin. Do not use cotton balls on the wound. After the wound is clean and dry, apply the ointment gently with a Q-tip. Cut a non-stick pad to fit the size of the wound. Lay the pad flush to the wound. If the wound is draining, you may want to reinforce it with a small amount of gauze on top of the non-stick pad for a little added compression to the area. Use the tape to seal the area completely. Select from the following with respect to your individual situation: If your wound has been stitched closed: continue the above steps 1-8 at least daily until your sutures are removed. If your wound has been left open to heal: continue steps 1-8 at least daily for the first 3-4 weeks. We would like for you to take a few extra precautions for at least the next week. Sleep with your head elevated on pillows if our wound is on your head. Do not bend over or lift heavy items to reduce the chance of elevated blood pressure to the wound Do not participate in particularly strenuous activities.   Below is a list of dressing supplies you might need.  Cotton-tipped applicators - Q-tips Gauze pads (2x2 and/or 4x4) - All-Purpose Sponges Non-stick dressing material - Telfa Tape -  Paper or Hypafix New and clean tube of petroleum jelly - Vaseline    Comments on Post-Operative Period Slight swelling and redness often appear around the wound. This is normal and will disappear within several days following the surgery. The healing wound will drain a brownish-red-yellow discharge during healing. This is a normal phase of wound healing. As the wound begins to heal, the drainage may increase in amount. Again, this drainage is normal. Notify us if the drainage becomes persistently bloody, excessively swollen, or intensely painful or develops a foul odor or red streaks.  If you should experience mild discomfort during the healing phase, you may take an aspirin-free medication such as Tylenol (acetaminophen). Notify us if the discomfort is severe or persistent. Avoid alcoholic beverages when taking pain medicine.  In Case of Wound Hemorrhage A wound hemorrhage is when the bandage suddenly becomes soaked with bright red blood and flows profusely. If this happens, sit down or lie down with your head elevated. If the wound has a dressing on it, do not remove the dressing. Apply pressure to the existing gauze. If the wound is not covered, use a gauze pad to apply pressure and continue applying the pressure for 20 minutes without peeking. DO NOT COVER THE WOUND WITH A LARGE TOWEL OR WASH CLOTH. Release your hand from the wound site but do not remove the dressing. If the bleeding has stopped,   gently clean around the wound. Leave the dressing in place for 24 hours if possible. This wait time allows the blood vessels to close off so that you do not spark a new round of bleeding by disrupting the newly clotted blood vessels with an immediate dressing change. If the bleeding does not subside, continue to hold pressure. If matters are out of your control, contact an After Hours clinic or go to the Emergency Room.   Due to recent changes in healthcare laws, you may see results of your pathology  and/or laboratory studies on MyChart before the doctors have had a chance to review them. We understand that in some cases there may be results that are confusing or concerning to you. Please understand that not all results are received at the same time and often the doctors may need to interpret multiple results in order to provide you with the best plan of care or course of treatment. Therefore, we ask that you please give us 2 business days to thoroughly review all your results before contacting the office for clarification. Should we see a critical lab result, you will be contacted sooner.   If You Need Anything After Your Visit  If you have any questions or concerns for your doctor, please call our main line at [phone] and press option 4 to reach your doctor's medical assistant. If no one answers, please leave a voicemail as directed and we will return your call as soon as possible. Messages left after 4 pm will be answered the following business day.   You may also send us a message via MyChart. We typically respond to MyChart messages within 1-2 business days.  For prescription refills, please ask your pharmacy to contact our office. Our fax number is [phone].  If you have an urgent issue when the clinic is closed that cannot wait until the next business day, you can page your doctor at the number below.    Please note that while we do our best to be available for urgent issues outside of office hours, we are not available 24/7.   If you have an urgent issue and are unable to reach us, you may choose to seek medical care at your doctor's office, retail clinic, urgent care center, or emergency room.  If you have a medical emergency, please immediately call 911 or go to the emergency department.  Pager Numbers  - Dr. Kowalski: [phone]  - Dr. Moye: [phone]  - Dr. Stewart: [phone]  In the event of inclement weather, please call our main line at [phone] for  an update on the status of any delays or closures.  Dermatology Medication Tips: Please keep the boxes that topical medications come in in order to help keep track of the instructions about where and how to use these. Pharmacies typically print the medication instructions only on the boxes and not directly on the medication tubes.   If your medication is too expensive, please contact our office at [phone] option 4 or send us a message through MyChart.   We are unable to tell what your co-pay for medications will be in advance as this is different depending on your insurance coverage. However, we may be able to find a substitute medication at lower cost or fill out paperwork to get insurance to cover a needed medication.   If a prior authorization is required to get your medication covered by your insurance company, please allow us 1-2 business days to complete this process.    Drug prices often vary depending on where the prescription is filled and some pharmacies may offer cheaper prices.  The website www.goodrx.com contains coupons for medications through different pharmacies. The prices here do not account for what the cost may be with help from insurance (it may be cheaper with your insurance), but the website can give you the price if you did not use any insurance.  - You can print the associated coupon and take it with your prescription to the pharmacy.  - You may also stop by our office during regular business hours and pick up a GoodRx coupon card.  - If you need your prescription sent electronically to a different pharmacy, notify our office through [facility] MyChart or by phone at [phone] option 4.     Si Usted Necesita Algo Despus de Su Visita  Tambin puede enviarnos un mensaje a travs de MyChart. Por lo general respondemos a los mensajes de MyChart en el transcurso de 1 a 2 das hbiles.  Para renovar recetas, por favor pida a su farmacia que se ponga en contacto con  nuestra oficina. Nuestro nmero de fax es el [phone].  Si tiene un asunto urgente cuando la clnica est cerrada y que no puede esperar hasta el siguiente da hbil, puede llamar/localizar a su doctor(a) al nmero que aparece a continuacin.   Por favor, tenga en cuenta que aunque hacemos todo lo posible para estar disponibles para asuntos urgentes fuera del horario de oficina, no estamos disponibles las 24 horas del da, los 7 das de la semana.   Si tiene un problema urgente y no puede comunicarse con nosotros, puede optar por buscar atencin mdica  en el consultorio de su doctor(a), en una clnica privada, en un centro de atencin urgente o en una sala de emergencias.  Si tiene una emergencia mdica, por favor llame inmediatamente al 911 o vaya a la sala de emergencias.  Nmeros de bper  - Dr. Kowalski: [phone]  - Dra. Moye: [phone]  - Dra. Stewart: [phone]  En caso de inclemencias del tiempo, por favor llame a nuestra lnea principal al [phone] para una actualizacin sobre el estado de cualquier retraso o cierre.  Consejos para la medicacin en dermatologa: Por favor, guarde las cajas en las que vienen los medicamentos de uso tpico para ayudarle a seguir las instrucciones sobre dnde y cmo usarlos. Las farmacias generalmente imprimen las instrucciones del medicamento slo en las cajas y no directamente en los tubos del medicamento.   Si su medicamento es muy caro, por favor, pngase en contacto con nuestra oficina llamando al [phone] y presione la opcin 4 o envenos un mensaje a travs de MyChart.   No podemos decirle cul ser su copago por los medicamentos por adelantado ya que esto es diferente dependiendo de la cobertura de su seguro. Sin embargo, es posible que podamos encontrar un medicamento sustituto a menor costo o llenar un formulario para que el seguro cubra el medicamento que se considera necesario.   Si se requiere una autorizacin  previa para que su compaa de seguros cubra su medicamento, por favor permtanos de 1 a 2 das hbiles para completar este proceso.  Los precios de los medicamentos varan con frecuencia dependiendo del lugar de dnde se surte la receta y alguna farmacias pueden ofrecer precios ms baratos.  El sitio web www.goodrx.com tiene cupones para medicamentos de diferentes farmacias. Los precios aqu no tienen en cuenta lo que podra costar con la ayuda del seguro (puede ser ms   barato con su seguro), pero el sitio web puede darle el precio si no Field seismologist.  - Puede imprimir el cupn correspondiente y llevarlo con su receta a la farmacia.  - Tambin puede pasar por nuestra oficina durante el horario de atencin regular y Charity fundraiser una tarjeta de cupones de GoodRx.  - Si necesita que su receta se enve electrnicamente a una farmacia diferente, informe a nuestra oficina a travs de MyChart de [place] o por telfono llamando al [phone] y presione la opcin 4. Due to recent changes in healthcare laws, you may see results of your pathology and/or laboratory studies on MyChart before the doctors have had a chance to review them. We understand that in some cases there may be results that are confusing or concerning to you. Please understand that not all results are received at the same time and often the doctors may need to interpret multiple results in order to provide you with the best plan of care or course of treatment. Therefore, we ask that you please give [place] 2 business days to thoroughly review all your results before contacting the office for clarification. Should we see a critical lab result, you will be contacted sooner.   If You Need Anything After Your Visit  If you have any questions or concerns for your doctor, please call our main line at [phone] and press option 4 to reach your doctor's medical assistant. If no one answers, please leave a voicemail as directed and we will return  your call as soon as possible. Messages left after 4 pm will be answered the following business day.   You may also send [place] a message via [place]. We typically respond to MyChart messages within 1-2 business days.  For prescription refills, please ask your pharmacy to contact our office. Our fax number is [phone].  If you have an urgent issue when the clinic is closed that cannot wait until the next business day, you can page your doctor at the number below.    Please note that while we do our best to be available for urgent issues outside of office hours, we are not available 24/7.   If you have an urgent issue and are unable to reach [place], you may choose to seek medical care at your doctor's office, retail clinic, urgent care center, or emergency room.  If you have a medical emergency, please immediately call 911 or go to the emergency department.  Pager Numbers  - Dr. Nehemiah Massed: [phone]  - Dr. Laurence Ferrari: [phone]  - Dr. Nicole Kindred: [phone]  In the event of inclement weather, please call our main line at [phone] for an update on the status of any delays or closures.  Dermatology Medication Tips: Please keep the boxes that topical medications come in in order to help keep track of the instructions about where and how to use these. Pharmacies typically print the medication instructions only on the boxes and not directly on the medication tubes.   If your medication is too expensive, please contact our office at [phone] option 4 or send [place] a message through [place].   We are unable to tell what your co-pay for medications will be in advance as this is different depending on your insurance coverage. However, we may be able to find a substitute medication at lower cost or fill out paperwork to get insurance to cover a needed medication.   If a prior authorization is required to get your medication covered by your insurance company,  please allow [place] 1-2 business days to  complete this process.  Drug prices often vary depending on where the prescription is filled and some pharmacies may offer cheaper prices.  The website www.goodrx.com contains coupons for medications through different pharmacies. The prices here do not account for what the cost may be with help from insurance (it may be cheaper with your insurance), but the website can give you the price if you did not use any insurance.  - You can print the associated coupon and take it with your prescription to the pharmacy.  - You may also stop by our office during regular business hours and pick up a GoodRx coupon card.  - If you need your prescription sent electronically to a different pharmacy, notify our office through [facility] or by phone at [phone] option 4.     Si Usted Necesita Algo Despus de Su Visita  Tambin puede enviarnos un mensaje a travs de Pharmacist, community. Por lo general respondemos a los mensajes de MyChart en el transcurso de 1 a 2 das hbiles.  Para renovar recetas, por favor pida a su farmacia que se ponga en contacto con nuestra oficina. Harland Dingwall de fax es [place] [phone].  Si tiene un asunto urgente cuando la clnica est cerrada y que no puede esperar hasta el siguiente da hbil, puede llamar/localizar a su doctor(a) al nmero que aparece a continuacin.   Por favor, tenga en cuenta que aunque hacemos todo lo posible para estar disponibles para asuntos urgentes fuera del horario de [place], no estamos disponibles las 24 horas del da, los 7 das de la [place].   Si tiene un problema urgente y no puede comunicarse con nosotros, puede optar por buscar atencin mdica  en el consultorio de su doctor(a), en una clnica privada, en un centro de atencin urgente o en una sala de emergencias.  Si tiene Engineering geologist, por favor llame inmediatamente al 911 o vaya a la sala de emergencias.  Nmeros de bper  - Dr. Nehemiah Massed: [phone]  - Dra. Moye:  [phone]  - Dra. Nicole Kindred: [phone]  En caso de inclemencias del [place], por favor llame a Johnsie Kindred principal al [phone] para una actualizacin sobre el [place] de cualquier retraso o cierre.  Consejos para la medicacin en dermatologa: Por favor, guarde las cajas en las que vienen los medicamentos de uso tpico para ayudarle a seguir las instrucciones sobre dnde y cmo usarlos. Las farmacias generalmente imprimen las instrucciones del medicamento slo en las cajas y no directamente en los tubos del [place].   Si su medicamento es muy caro, por favor, pngase en contacto con Zigmund Daniel llamando al [phone] y presione la opcin 4 o envenos un mensaje a travs de Pharmacist, community.   No podemos decirle cul ser su copago por los medicamentos por adelantado ya que esto es diferente dependiendo de la cobertura de su seguro. Sin embargo, es posible que podamos encontrar un medicamento sustituto a Electrical engineer un formulario para que el seguro cubra el medicamento que se considera necesario.   Si se requiere una autorizacin previa para que su compaa de seguros [place] su medicamento, por favor permtanos de 1 a 2 das hbiles para completar este proceso.  Los precios de los medicamentos varan con frecuencia dependiendo del Environmental consultant de dnde se surte la receta y alguna farmacias pueden ofrecer precios ms baratos.  El sitio web www.goodrx.com tiene cupones para medicamentos de Airline pilot. Los precios aqu no tienen en cuenta lo  que podra costar con la ayuda del seguro (puede ser ms barato con su seguro), pero el sitio web puede darle el precio si no Field seismologist.  - Puede imprimir el cupn correspondiente y llevarlo con su receta a la farmacia.  - Tambin puede pasar por nuestra oficina durante el horario de atencin regular y Charity fundraiser una tarjeta de cupones de GoodRx.  - Si necesita que su receta se enve electrnicamente a una farmacia diferente,  informe a nuestra oficina a travs de MyChart de [place] o por telfono llamando al [phone] y presione la opcin 4.

## 2022-01-07 NOTE — Progress Notes (Signed)
   Follow-Up Visit   Subjective  Courtney Ross is a 66 y.o. female who presents for the following: Cyst (Right mid lat vertex scalp - excise today).  The following portions of the chart were reviewed this encounter and updated as appropriate:   Tobacco  Allergies  Meds  Problems  Med Hx  Surg Hx  Fam Hx     Review of Systems:  No other skin or systemic complaints except as noted in HPI or Assessment and Plan.  Objective  Well appearing patient in no apparent distress; mood and affect are within normal limits.  A focused examination was performed including scalp. Relevant physical exam findings are noted in the Assessment and Plan.  Right mid lat vertex Cystic papule   Assessment & Plan  Neoplasm of uncertain behavior of skin Right mid lat vertex  Skin excision  Lesion length (cm):  2.1 Lesion width (cm):  2.1 Margin per side (cm):  0 Total excision diameter (cm):  2.1 Informed consent: discussed and consent obtained   Timeout: patient name, date of birth, surgical site, and procedure verified   Procedure prep:  Patient was prepped and draped in usual sterile fashion Prep type:  Isopropyl alcohol and povidone-iodine Anesthesia: the lesion was anesthetized in a standard fashion   Anesthetic:  1% lidocaine w/ epinephrine 1-100,000 buffered w/ 8.4% NaHCO3 Instrument used: #15 blade   Hemostasis achieved with: pressure   Hemostasis achieved with comment:  Electrocautery Outcome: patient tolerated procedure well with no complications   Post-procedure details: sterile dressing applied and wound care instructions given   Dressing type: bandage and pressure dressing (mupirocin)    Skin repair Complexity:  Complex Final length (cm):  2.1 Reason for type of repair: reduce tension to allow closure, reduce the risk of dehiscence, infection, and necrosis, reduce subcutaneous dead space and avoid a hematoma, allow closure of the large defect, preserve normal anatomy, preserve  normal anatomical and functional relationships and enhance both functionality and cosmetic results   Undermining comment:  Undermining defect 2.1 cm Subcutaneous layers (deep stitches):  Suture size:  4-0 Suture type: Vicryl (polyglactin 910)   Subcutaneous suture technique: inverted dermal. Fine/surface layer approximation (top stitches):  Suture size:  3-0 Suture type: nylon   Stitches: simple running   Suture removal (days):  7 Hemostasis achieved with: suture and pressure Outcome: patient tolerated procedure well with no complications   Post-procedure details: sterile dressing applied and wound care instructions given   Dressing type: bandage and pressure dressing (mupirocin)    mupirocin ointment (BACTROBAN) 2 % Apply 1 Application topically daily. With dressing changes  Specimen 1 - Surgical pathology Differential Diagnosis: Cyst vs other  Check Margins: No   Return in about 1 week (around 01/14/2022) for suture removal, spots of hands and face.  I, Ashok Cordia, CMA, am acting as scribe for Sarina Ser, MD . Documentation: I have reviewed the above documentation for accuracy and completeness, and I agree with the above.  Sarina Ser, MD

## 2022-01-08 ENCOUNTER — Telehealth: Payer: Self-pay

## 2022-01-08 NOTE — Telephone Encounter (Addendum)
Called and discussed results with patient. Patient denies further questions.   ----- Message from Ralene Bathe, MD sent at 01/08/2022  5:22 PM EDT ----- Diagnosis Skin (M), right mid lat vertex EXCISION, PILAR CYST  Benign cyst

## 2022-01-14 ENCOUNTER — Ambulatory Visit (INDEPENDENT_AMBULATORY_CARE_PROVIDER_SITE_OTHER): Payer: 59 | Admitting: Dermatology

## 2022-01-14 DIAGNOSIS — L821 Other seborrheic keratosis: Secondary | ICD-10-CM

## 2022-01-14 DIAGNOSIS — L578 Other skin changes due to chronic exposure to nonionizing radiation: Secondary | ICD-10-CM

## 2022-01-14 DIAGNOSIS — L820 Inflamed seborrheic keratosis: Secondary | ICD-10-CM

## 2022-01-14 DIAGNOSIS — L988 Other specified disorders of the skin and subcutaneous tissue: Secondary | ICD-10-CM

## 2022-01-14 DIAGNOSIS — L7211 Pilar cyst: Secondary | ICD-10-CM

## 2022-01-14 NOTE — Patient Instructions (Signed)
Due to recent changes in healthcare laws, you may see results of your pathology and/or laboratory studies on MyChart before the doctors have had a chance to review them. We understand that in some cases there may be results that are confusing or concerning to you. Please understand that not all results are received at the same time and often the doctors may need to interpret multiple results in order to provide you with the best plan of care or course of treatment. Therefore, we ask that you please give us 2 business days to thoroughly review all your results before contacting the office for clarification. Should we see a critical lab result, you will be contacted sooner.   If You Need Anything After Your Visit  If you have any questions or concerns for your doctor, please call our main line at [phone] and press option 4 to reach your doctor's medical assistant. If no one answers, please leave a voicemail as directed and we will return your call as soon as possible. Messages left after 4 pm will be answered the following business day.   You may also send us a message via MyChart. We typically respond to MyChart messages within 1-2 business days.  For prescription refills, please ask your pharmacy to contact our office. Our fax number is [phone].  If you have an urgent issue when the clinic is closed that cannot wait until the next business day, you can page your doctor at the number below.    Please note that while we do our best to be available for urgent issues outside of office hours, we are not available 24/7.   If you have an urgent issue and are unable to reach us, you may choose to seek medical care at your doctor's office, retail clinic, urgent care center, or emergency room.  If you have a medical emergency, please immediately call 911 or go to the emergency department.  Pager Numbers  - Dr. Kowalski: [phone]  - Dr. Moye: [phone]  - Dr. Stewart:  [phone]  In the event of inclement weather, please call our main line at [phone] for an update on the status of any delays or closures.  Dermatology Medication Tips: Please keep the boxes that topical medications come in in order to help keep track of the instructions about where and how to use these. Pharmacies typically print the medication instructions only on the boxes and not directly on the medication tubes.   If your medication is too expensive, please contact our office at [phone] option 4 or send us a message through MyChart.   We are unable to tell what your co-pay for medications will be in advance as this is different depending on your insurance coverage. However, we may be able to find a substitute medication at lower cost or fill out paperwork to get insurance to cover a needed medication.   If a prior authorization is required to get your medication covered by your insurance company, please allow us 1-2 business days to complete this process.  Drug prices often vary depending on where the prescription is filled and some pharmacies may offer cheaper prices.  The website www.goodrx.com contains coupons for medications through different pharmacies. The prices here do not account for what the cost may be with help from insurance (it may be cheaper with your insurance), but the website can give you the price if you did not use any insurance.  - You can print the associated coupon and take it with   your prescription to the pharmacy.  - You may also stop by our office during regular business hours and pick up a GoodRx coupon card.  - If you need your prescription sent electronically to a different pharmacy, notify our office through [place] MyChart or by phone at [phone] option 4.     Si Usted Necesita Algo Despus de Su Visita  Tambin puede enviarnos un mensaje a travs de MyChart. Por lo general respondemos a los mensajes de MyChart en el transcurso de 1 a 2  das hbiles.  Para renovar recetas, por favor pida a su farmacia que se ponga en contacto con nuestra oficina. Nuestro nmero de fax es el [phone].  Si tiene un asunto urgente cuando la clnica est cerrada y que no puede esperar hasta el siguiente da hbil, puede llamar/localizar a su doctor(a) al nmero que aparece a continuacin.   Por favor, tenga en cuenta que aunque hacemos todo lo posible para estar disponibles para asuntos urgentes fuera del horario de oficina, no estamos disponibles las 24 horas del da, los 7 das de la semana.   Si tiene un problema urgente y no puede comunicarse con nosotros, puede optar por buscar atencin mdica  en el consultorio de su doctor(a), en una clnica privada, en un centro de atencin urgente o en una sala de emergencias.  Si tiene una emergencia mdica, por favor llame inmediatamente al 911 o vaya a la sala de emergencias.  Nmeros de bper  - Dr. Kowalski: [phone]  - Dra. Moye: [phone]  - Dra. Stewart: [phone]  En caso de inclemencias del tiempo, por favor llame a nuestra lnea principal al [phone] para una actualizacin sobre el estado de cualquier retraso o cierre.  Consejos para la medicacin en dermatologa: Por favor, guarde las cajas en las que vienen los medicamentos de uso tpico para ayudarle a seguir las instrucciones sobre dnde y cmo usarlos. Las farmacias generalmente imprimen las instrucciones del medicamento slo en las cajas y no directamente en los tubos del medicamento.   Si su medicamento es muy caro, por favor, pngase en contacto con nuestra oficina llamando al [phone] y presione la opcin 4 o envenos un mensaje a travs de MyChart.   No podemos decirle cul ser su copago por los medicamentos por adelantado ya que esto es diferente dependiendo de la cobertura de su seguro. Sin embargo, es posible que podamos encontrar un medicamento sustituto a menor costo o llenar un formulario para que el  seguro cubra el medicamento que se considera necesario.   Si se requiere una autorizacin previa para que su compaa de seguros cubra su medicamento, por favor permtanos de 1 a 2 das hbiles para completar este proceso.  Los precios de los medicamentos varan con frecuencia dependiendo del lugar de dnde se surte la receta y alguna farmacias pueden ofrecer precios ms baratos.  El sitio web www.goodrx.com tiene cupones para medicamentos de diferentes farmacias. Los precios aqu no tienen en cuenta lo que podra costar con la ayuda del seguro (puede ser ms barato con su seguro), pero el sitio web puede darle el precio si no utiliz ningn seguro.  - Puede imprimir el cupn correspondiente y llevarlo con su receta a la farmacia.  - Tambin puede pasar por nuestra oficina durante el horario de atencin regular y recoger una tarjeta de cupones de GoodRx.  - Si necesita que su receta se enve electrnicamente a una farmacia diferente, informe a nuestra oficina a travs de MyChart de [place]   o por telfono llamando al [phone] y presione la opcin 4.  

## 2022-01-14 NOTE — Progress Notes (Signed)
Follow-Up Visit   Subjective  Courtney Ross is a 66 y.o. female who presents for the following: Post op/suture removal (R mid lat vertex scalp - pathology proven benign cyst, patient is here today for suture removal), Irregular skin lesion (On the R infra occular - patient is concerned and would like it removed today if possible.), and Facial Elastosis (Patient is here today for Botox injections to the frown complex.).  The following portions of the chart were reviewed this encounter and updated as appropriate:   Tobacco  Allergies  Meds  Problems  Med Hx  Surg Hx  Fam Hx     Review of Systems:  No other skin or systemic complaints except as noted in HPI or Assessment and Plan.  Objective  Well appearing patient in no apparent distress; mood and affect are within normal limits.  A focused examination was performed including the face and scalp. Relevant physical exam findings are noted in the Assessment and Plan.  Face Rhytides and volume loss.                  R mid lat vertex scalp Healing excision site.  R infra ocular x 1 Erythematous stuck-on, waxy papule or plaque   Assessment & Plan  Elastosis of skin Face Botox 20 units injected into the frown complex  Botox Injection - Face Location: See attached image  Informed consent: Discussed risks (infection, pain, bleeding, bruising, swelling, allergic reaction, paralysis of nearby muscles, eyelid droop, double vision, neck weakness, difficulty breathing, headache, undesirable cosmetic result, and need for additional treatment) and benefits of the procedure, as well as the alternatives.  Informed consent was obtained.  Preparation: The area was cleansed with alcohol.  Procedure Details:  Botox was injected into the dermis with a 30-gauge needle. Pressure applied to any bleeding. Ice packs offered for swelling.  Lot Number:  [id number] Expiration:  02/2024  Total Units Injected:  20  Plan: Patient  was instructed to remain upright for 4 hours. Patient was instructed to avoid massaging the face and avoid vigorous exercise for the rest of the day. Tylenol may be used for headache.  Allow 2 weeks before returning to clinic for additional dosing as needed. Patient will call for any problems.  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  - Discussed pathology results showing a benign pilar cyst.  - Patient advised to keep steri-strips dry until they fall off. - Scars remodel for a full year. - Once steri-strips fall off, patient can apply over-the-counter silicone scar cream each night to help with scar remodeling if desired. - Patient advised to call with any concerns or if they notice any new or changing lesions.  Inflamed seborrheic keratosis R infra ocular x 1 Destruction of lesion - R infra ocular x 1 Complexity: simple   Destruction method: cryotherapy   Informed consent: discussed and consent obtained   Timeout:  patient name, date of birth, surgical site, and procedure verified Lesion destroyed using liquid nitrogen: Yes   Region frozen until ice ball extended beyond lesion: Yes   Outcome: patient tolerated procedure well with no complications   Post-procedure details: wound care instructions given    Seborrheic Keratoses - Stuck-on, waxy, tan-brown papules and/or plaques  - Benign-appearing - Discussed benign etiology and prognosis. - Observe -  Call for any changes  Actinic Damage - chronic, secondary to cumulative UV radiation exposure/sun exposure over time - diffuse scaly erythematous macules with underlying dyspigmentation - Recommend daily broad spectrum sunscreen SPF 30+ to sun-exposed areas, reapply every 2 hours as needed.  - Recommend staying in the shade or wearing long sleeves, sun glasses  (UVA+UVB protection) and wide brim hats (4-inch brim around the entire circumference of the hat). - Call for new or changing lesions.  Return in about 3 months (around 04/16/2022) for Botox injections.  Luther Redo, CMA, am acting as scribe for Sarina Ser, MD . Documentation: I have reviewed the above documentation for accuracy and completeness, and I agree with the above.  Sarina Ser, MD

## 2022-01-17 ENCOUNTER — Encounter: Payer: Self-pay | Admitting: Dermatology

## 2022-01-19 ENCOUNTER — Encounter: Payer: Self-pay | Admitting: Dermatology

## 2022-01-21 ENCOUNTER — Other Ambulatory Visit: Payer: Self-pay | Admitting: Family Medicine

## 2022-02-04 ENCOUNTER — Other Ambulatory Visit: Payer: Self-pay | Admitting: Family Medicine

## 2022-02-04 NOTE — Telephone Encounter (Signed)
Last refill:05/02/2021 #90 with 1 refill  Last office visit: 08/26/2021- this appointment did not address follow up for this medication.   Next office visit: no OV scheduled with PCP. She has appt with [facility] on 07/16/2022.

## 2022-02-21 ENCOUNTER — Other Ambulatory Visit: Payer: Self-pay | Admitting: Family Medicine

## 2022-02-27 ENCOUNTER — Other Ambulatory Visit: Payer: Self-pay | Admitting: Family Medicine

## 2022-03-14 ENCOUNTER — Other Ambulatory Visit: Payer: Self-pay | Admitting: Family Medicine

## 2022-03-14 DIAGNOSIS — G2581 Restless legs syndrome: Secondary | ICD-10-CM

## 2022-03-24 ENCOUNTER — Other Ambulatory Visit: Payer: Self-pay | Admitting: Family Medicine

## 2022-03-26 ENCOUNTER — Other Ambulatory Visit: Payer: Self-pay | Admitting: *Deleted

## 2022-03-26 DIAGNOSIS — G2581 Restless legs syndrome: Secondary | ICD-10-CM

## 2022-03-26 NOTE — Telephone Encounter (Signed)
Christine from [employer] called and would like Pramipexol rx to be updated to 2 tablets before bedtime, will send rx back through.

## 2022-03-26 NOTE — Telephone Encounter (Signed)
Unable to refill per protocol, last refill by provider 03/15/22 for 180 and 3 RF. Will refuse.   Requested Prescriptions  Pending Prescriptions Disp Refills  . pramipexole (MIRAPEX) 0.25 MG tablet 180 tablet 3    Sig: Take 1-2 tablets (0.25-0.5 mg total) by mouth at bedtime. TAKE ONE-HALF TABLET BY MOUTH 1  HOUR BEFORE BEDTIME FOR 4 DAYS,  THEN INCREASE TO 1 TABLET BEFORE BEDTIME FOR 4 DAYS, THEN 2  TABLETS BEFORE BEDTIME     Neurology:  Parkinsonian Agents Passed - 03/26/2022 11:47 AM      Passed - Last BP in normal range    BP Readings from Last 1 Encounters:  08/26/21 139/83         Passed - Last Heart Rate in normal range    Pulse Readings from Last 1 Encounters:  08/26/21 85         Passed - Valid encounter within last 12 months    Recent Outpatient Visits          7 months ago Chronic lumbar radicular pain (Right side)   [facility] Birdie Sons, MD   1 year ago Restless legs syndrome   [facility] Birdie Sons, MD   1 year ago Essential hypertension   [facility] Birdie Sons, MD   2 years ago Vitamin D deficiency   [facility] Birdie Sons, MD   3 years ago Annual physical exam   [facility] Birdie Sons, MD

## 2022-03-31 ENCOUNTER — Ambulatory Visit: Payer: Self-pay | Admitting: *Deleted

## 2022-03-31 DIAGNOSIS — G2581 Restless legs syndrome: Secondary | ICD-10-CM

## 2022-03-31 NOTE — Addendum Note (Signed)
Addended by: Randal Buba on: 03/31/2022 03:46 PM   Modules accepted: Orders

## 2022-03-31 NOTE — Telephone Encounter (Signed)
Summary: Clairity of sig   Kristen calling from [facility] is calling to get clarity on the sig of pramipexole (MIRAPEX) 0.25 MG tablet.     [facility]ed [facility]- patient is now on maintenance dosing- please update Rx. Reference# [id number] Pharmacy is requesting Sig update Reason for Disposition  [1] Caller has URGENT medicine question about med that PCP or specialist prescribed AND [2] triager unable to answer question  Answer Assessment - Initial Assessment Questions 1. NAME of MEDICINE: "What medicine(s) are you calling about?"     pramipexole (MIRAPEX) 0.25 MG tablet 2. QUESTION: "What is your question?" (e.g., double dose of medicine, side effect)     Pharmacy wants updated instructions- patient should be on maintenance dosing now 3. PRESCRIBER: "Who prescribed the medicine?" Reason: if prescribed by specialist, call should be referred to that group.     PCP  Protocols used: Medication Question Call-A-AH

## 2022-03-31 NOTE — Telephone Encounter (Signed)
Please see [facility] triage note regarding directions needing to be updated.

## 2022-04-01 MED ORDER — PRAMIPEXOLE DIHYDROCHLORIDE 0.25 MG PO TABS: 0.5000 mg | ORAL_TABLET | Freq: Every day | ORAL | 4 refills | Status: DC

## 2022-04-01 NOTE — Addendum Note (Signed)
Addended by: Birdie Sons on: 04/01/2022 07:54 AM   Modules accepted: Orders

## 2022-04-08 ENCOUNTER — Other Ambulatory Visit: Payer: Self-pay

## 2022-04-08 ENCOUNTER — Telehealth: Payer: Self-pay | Admitting: Family Medicine

## 2022-04-08 ENCOUNTER — Other Ambulatory Visit: Payer: Self-pay | Admitting: Family Medicine

## 2022-04-08 DIAGNOSIS — N63 Unspecified lump in unspecified breast: Secondary | ICD-10-CM

## 2022-04-08 DIAGNOSIS — N6312 Unspecified lump in the right breast, upper inner quadrant: Secondary | ICD-10-CM

## 2022-04-08 NOTE — Telephone Encounter (Signed)
Patient advised.

## 2022-04-08 NOTE — Telephone Encounter (Signed)
Per mammogram report 10/16/2021  IMPRESSION: Right breast 2 o'clock probably benign mass, for which short-term imaging follow-up is recommended.   RECOMMENDATION: Right diagnostic mammogram and focused right breast ultrasound in 6 months.  Please check with patient to see if she has been contacted by [facility] to schedule. If not  then need to place order.

## 2022-04-18 ENCOUNTER — Telehealth: Payer: Self-pay | Admitting: Family Medicine

## 2022-04-18 DIAGNOSIS — M5137 Other intervertebral disc degeneration, lumbosacral region: Secondary | ICD-10-CM

## 2022-04-18 MED ORDER — LIDOCAINE 5 % EX PTCH
1.0000 | MEDICATED_PATCH | CUTANEOUS | 1 refills | Status: DC
Start: 2022-04-18 — End: 2022-04-28

## 2022-04-18 NOTE — Telephone Encounter (Signed)
Lidocaine patches not on current medication list, please advise.

## 2022-04-18 NOTE — Telephone Encounter (Signed)
Prescription sent to [facility]

## 2022-04-18 NOTE — Telephone Encounter (Signed)
Medication Refill - Medication: lidocaine 5% patch,  place 1 patch on skin daily for 30 days, apply patch to the most painful area for up to 12 hours in a 24 hour period  The prescribing doctor is her [facility] neurosurgeon who she is unable to connect with. Patient was advised by her pharmacy and insurance to connect with the PCP.  Pharmacy and Insurance states a PA is required.  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 Yes.    Agent: Please be advised that RX refills may take up to 3 business days. We ask that you follow-up with your pharmacy.

## 2022-04-22 ENCOUNTER — Other Ambulatory Visit: Payer: Self-pay | Admitting: Family Medicine

## 2022-04-22 ENCOUNTER — Telehealth: Payer: Self-pay | Admitting: Family Medicine

## 2022-04-22 NOTE — Telephone Encounter (Signed)
Requested medication (s) are due for refill today -yes  Requested medication (s) are on the active medication list -yes  Future visit scheduled -no  Last refill: 03/24/22 #180  Notes to clinic: non delegated Rx  Requested Prescriptions  Pending Prescriptions Disp Refills   oxyCODONE (ROXICODONE) 15 MG immediate release tablet [Pharmacy Med Name: OXYCODONE HCL 15 MG TAB] 180 tablet     Sig: TAKE ONE TO TWO TABLETS [place] PAIN     Not Delegated - Analgesics:  Opioid Agonists Failed - 04/22/2022 10:04 AM      Failed - This refill cannot be delegated      Failed - Urine Drug Screen completed in last 360 days      Failed - Valid encounter within last 3 months    Recent Outpatient Visits           7 months ago Chronic lumbar radicular pain (Right side)   [facility] Birdie Sons, MD   1 year ago Restless legs syndrome   [facility] Birdie Sons, MD   1 year ago Essential hypertension   [facility] Birdie Sons, MD   2 years ago Vitamin D deficiency   [facility] Fisher, Kirstie Peri, MD   3 years ago Annual physical exam   [facility] Birdie Sons, MD                 Requested Prescriptions  Pending Prescriptions Disp Refills   oxyCODONE (ROXICODONE) 15 MG immediate release tablet [Pharmacy Med Name: OXYCODONE HCL 15 MG TAB] 180 tablet     Sig: TAKE ONE TO TWO TABLETS EVERY FOUR HOURSAS NEEDED FOR PAIN     Not Delegated - Analgesics:  Opioid Agonists Failed - 04/22/2022 10:04 AM      Failed - This refill cannot be delegated      Failed - Urine Drug Screen completed in last 360 days      Failed - Valid encounter within last 3 months    Recent Outpatient Visits           7 months ago Chronic lumbar radicular pain (Right side)   [facility] Birdie Sons, MD   1 year ago Restless legs syndrome   [facility] Birdie Sons, MD   1 year ago Essential hypertension   [facility] Birdie Sons, MD   2 years ago Vitamin D deficiency   [facility] Birdie Sons, MD   3 years ago Annual physical exam   [facility] Birdie Sons, MD

## 2022-04-22 NOTE — Telephone Encounter (Signed)
Patient called in states needs PA for lidocaine (LIDODERM) 5 % . [employer] phone # is [phone] and  fx [phone]

## 2022-04-28 ENCOUNTER — Ambulatory Visit
Admission: RE | Admit: 2022-04-28 | Discharge: 2022-04-28 | Disposition: A | Discharge status: Home / Self Care | Payer: 59 | Source: Ambulatory Visit | Attending: Family Medicine | Admitting: Family Medicine

## 2022-04-28 ENCOUNTER — Other Ambulatory Visit: Payer: Self-pay | Admitting: Family Medicine

## 2022-04-28 DIAGNOSIS — N6310 Unspecified lump in the right breast, unspecified quadrant: Secondary | ICD-10-CM | POA: Diagnosis present

## 2022-04-28 DIAGNOSIS — N63 Unspecified lump in unspecified breast: Secondary | ICD-10-CM | POA: Insufficient documentation

## 2022-04-28 DIAGNOSIS — M5137 Other intervertebral disc degeneration, lumbosacral region: Secondary | ICD-10-CM

## 2022-04-28 DIAGNOSIS — N6312 Unspecified lump in the right breast, upper inner quadrant: Secondary | ICD-10-CM | POA: Diagnosis not present

## 2022-04-28 MED ORDER — LIDOCAINE 5 % EX PTCH: 1.0000 | MEDICATED_PATCH | CUTANEOUS | 1 refills | Status: DC

## 2022-05-05 ENCOUNTER — Other Ambulatory Visit: Payer: Self-pay

## 2022-05-05 ENCOUNTER — Ambulatory Visit (INDEPENDENT_AMBULATORY_CARE_PROVIDER_SITE_OTHER): Payer: 59 | Admitting: Family Medicine

## 2022-05-05 ENCOUNTER — Encounter: Payer: Self-pay | Admitting: Family Medicine

## 2022-05-05 VITALS — BP 126/83 | HR 98 | Temp 98.6°F | Ht 68.0 in | Wt 196.0 lb

## 2022-05-05 DIAGNOSIS — S46911A Strain of unspecified muscle, fascia and tendon at shoulder and upper arm level, right arm, initial encounter: Secondary | ICD-10-CM

## 2022-05-05 DIAGNOSIS — K2100 Gastro-esophageal reflux disease with esophagitis, without bleeding: Secondary | ICD-10-CM | POA: Diagnosis not present

## 2022-05-05 DIAGNOSIS — R490 Dysphonia: Secondary | ICD-10-CM | POA: Diagnosis not present

## 2022-05-05 DIAGNOSIS — R1310 Dysphagia, unspecified: Secondary | ICD-10-CM | POA: Diagnosis not present

## 2022-05-05 MED ORDER — FAMOTIDINE 20 MG PO TABS: 20.0000 mg | ORAL_TABLET | Freq: Every evening | ORAL | 4 refills | Status: DC

## 2022-05-05 NOTE — Telephone Encounter (Signed)
Pt called to report that her surgeon at [facility] called this in to her pharmacy. A PA is needed for [facility], she is about to have spinal surgery. [employer] called reporting that they never received the PAT request.  Best contact: [phone] (pt)

## 2022-05-05 NOTE — Progress Notes (Signed)
I,Roshena L Chambers,acting as a scribe for Lelon Huh, MD.,have documented all relevant documentation on the behalf of Lelon Huh, MD,as directed by  Lelon Huh, MD while in the presence of Lelon Huh, MD.    Established patient visit   Patient: Courtney Ross   DOB: February 27, 1956   66 y.o. Female  MRN: [id number] Visit Date: 05/05/2022  Today's healthcare provider: Lelon Huh, MD   Chief Complaint  Patient presents with   Hoarse    Started about a month ago   Subjective    HPI Patient reports 1 month intermittent hoarseness. No active smoking history, but second hand smoking. Is on omeprazole for GERD associated with indigestion after gallbladder removed. She also reports having increasing difficulties swallowing foods and feeling like it gets stuck in her throat.    Also complains of 1 month right shoulder pain after dog on leash pulled on her arm.    Medications: Outpatient Medications Prior to Visit  Medication Sig   aspirin EC 81 MG tablet Take 81 mg by mouth at bedtime.   buPROPion (WELLBUTRIN SR) 150 MG 12 hr tablet TAKE 1 TABLET BY MOUTH 3 TIMES  DAILY   Cholecalciferol (VITAMIN D3) 5000 UNITS CAPS Take 5,000 Units by mouth daily.    Coenzyme Q10 200 MG capsule Take 200 mg by mouth daily.   cyclobenzaprine (FLEXERIL) 10 MG tablet Take 1 tablet (10 mg total) by mouth 3 (three) times daily as needed for muscle spasms.   diazepam (VALIUM) 10 MG tablet TAKE 1 TABLET BY MOUTH EVERY 6 HOURS AS NEEDED FOR ANXIETY.   diclofenac Sodium (VOLTAREN) 1 % GEL SMARTSIG:1 Gram(s) Topical 4 Times Daily PRN   estradiol (ESTRACE) 1 MG tablet TAKE 1 TABLET BY MOUTH DAILY   fluticasone (FLONASE) 50 MCG/ACT nasal spray Place 1 spray into both nostrils daily as needed for allergies or rhinitis.   hydroquinone 4 % cream Apply 1 application topically daily.   lidocaine (LIDODERM) 5 % Place 1 patch onto the skin daily. Remove & Discard patch within 12 hours or as directed by  MD   LINZESS 145 MCG CAPS capsule TAKE 1 CAPSULE BY MOUTH  DAILY   meloxicam (MOBIC) 15 MG tablet TAKE 1 TABLET BY MOUTH AT  BEDTIME AS NEEDED FOR PAIN   Multiple Vitamin (MULTIVITAMIN WITH MINERALS) TABS tablet Take 1 tablet by mouth daily.   mupirocin cream (BACTROBAN) 2 % APPLY TO AFFECTED AREAS TOPICALLY TWICE DAILY   mupirocin ointment (BACTROBAN) 2 % Apply 1 Application topically daily. With dressing changes   MYRBETRIQ 25 MG TB24 tablet TAKE 1 TABLET BY MOUTH  DAILY   omeprazole (PRILOSEC) 40 MG capsule Take 1 capsule (40 mg total) by mouth daily as needed (heartburn).   oxyCODONE (ROXICODONE) 15 MG immediate release tablet TAKE ONE TO TWO TABLETS EVERY FOUR HOURSAS NEEDED FOR PAIN   polyethylene glycol (MIRALAX / GLYCOLAX) packet Take 17 g by mouth 2 (two) times daily as needed for mild constipation (dissolve in an 8 oz glass of water).   pramipexole (MIRAPEX) 0.25 MG tablet Take 2 tablets (0.5 mg total) by mouth at bedtime.   pregabalin (LYRICA) 150 MG capsule Take 150 mg by mouth 2 (two) times daily.   RESTASIS 0.05 % ophthalmic emulsion Place 1 drop into both eyes 2 (two) times daily.    rosuvastatin (CRESTOR) 10 MG tablet TAKE 1 TABLET BY MOUTH DAILY   sertraline (ZOLOFT) 50 MG tablet Take 1 tablet (50 mg total) by mouth  daily.   Turmeric 500 MG CAPS Take 500 mg by mouth daily.    [DISCONTINUED] phentermine 37.5 MG capsule Take 1 capsule (37.5 mg total) by mouth every morning.   [DISCONTINUED] pregabalin (LYRICA) 75 MG capsule Take 1 capsule (75 mg total) by mouth 3 (three) times daily.   [DISCONTINUED] scopolamine (TRANSDERM-SCOP) 1 MG/3DAYS Transderm-Scop 1 mg over 3 days transdermal patch   No facility-administered medications prior to visit.    Review of Systems  Constitutional:  Negative for appetite change, chills, fatigue and fever.  HENT:  Positive for voice change.   Respiratory:  Negative for chest tightness and shortness of breath.   Cardiovascular:  Negative for  chest pain and palpitations.  Gastrointestinal:  Negative for abdominal pain, nausea and vomiting.  Neurological:  Negative for dizziness and weakness.       Objective    BP 126/83   Pulse 98   Temp 98.6 F (37 C)   Ht '5\' 8"'$  (1.727 m)   Wt 196 lb (88.9 kg)   BMI 29.80 kg/m    Physical Exam   General Appearance:    Well developed, well nourished female, alert, cooperative, in no acute distress  HENT:   ENT exam normal, no neck nodes or sinus tenderness  Eyes:    PERRL, conjunctiva/corneas clear, EOM's intact       Lungs:     Clear to auscultation bilaterally, respirations unlabored  Heart:    Normal heart rate. Normal rhythm. No murmurs, rubs, or gallops.    Neurologic:   Awake, alert, oriented x 3. No apparent focal neurological           defect.        Assessment & Plan     1. Hoarseness, persistent Discussed differential diagnosis including more likely etiologies such as GERD and vocal cord nodules, versus less likely conditions such as dysplasia and malignancies. Will add H2 blocker for GERD as below and refer ENT for further evaluation.   2. Dysphagia, unspecified type Refer to GI to consider endoscopic evaluation. Add H2 blocker for GERD as below.   3. Gastroesophageal reflux disease with esophagitis, unspecified whether hemorrhage Is already on '40mg'$  omeprazole every day.  add famotidine (PEPCID) 20 MG tablet; Take 1 tablet (20 mg total) by mouth every evening.  Dispense: 30 tablet; Refill: 4   4. Shoulder strain, right, initial encounter Advised she could call for orthopedic referral if needed.        The entirety of the information documented in the History of Present Illness, Review of Systems and Physical Exam were personally obtained by me. Portions of this information were initially documented by the CMA and reviewed by me for thoroughness and accuracy.     Lelon Huh, MD  [facility] [phone] (phone) [phone] (fax)  [place]

## 2022-05-05 NOTE — Telephone Encounter (Signed)
Patient seen in office for this.

## 2022-05-07 ENCOUNTER — Encounter: Payer: Self-pay | Admitting: Gastroenterology

## 2022-05-07 ENCOUNTER — Telehealth: Payer: Self-pay

## 2022-05-07 ENCOUNTER — Other Ambulatory Visit: Payer: Self-pay

## 2022-05-07 ENCOUNTER — Ambulatory Visit (INDEPENDENT_AMBULATORY_CARE_PROVIDER_SITE_OTHER): Payer: 59 | Admitting: Gastroenterology

## 2022-05-07 VITALS — BP 177/82 | HR 89 | Temp 98.1°F | Ht 68.0 in | Wt 197.2 lb

## 2022-05-07 DIAGNOSIS — R1319 Other dysphagia: Secondary | ICD-10-CM

## 2022-05-07 DIAGNOSIS — K219 Gastro-esophageal reflux disease without esophagitis: Secondary | ICD-10-CM | POA: Diagnosis not present

## 2022-05-07 NOTE — Progress Notes (Signed)
Courtney Darby, MD [address]  [place]  [place], [place] [address]  Main: [phone]  Fax: [phone]    Gastroenterology Consultation  Referring Provider:     Birdie Sons, MD Primary Care Physician:  Birdie Sons, MD Primary Gastroenterologist:  Dr. Vicente Males Reason for Consultation: Dysphagia, chronic GERD        HPI:   Courtney Ross is a 66 y.o. female referred by Dr. Birdie Sons, MD  for consultation & management of dysphagia, chronic GERD.  Patient has history of chronic GERD for which she takes omeprazole 40 mg daily.  She reports that for more than a month, she has been experiencing hoarseness of voice, upper respiratory infection has been ruled out.  She does report worsening of nocturnal heartburn as well as worsening of reflux when bending over.  Dr. Caryn Section added Pepcid daily along with omeprazole.  She also reports food getting stuck in her throat, has been chewing well due to fear of choking episodes.  She does eat desserts on a regular basis.  She has been gaining weight due to decreased physical activity from back issues.  She used to walk along with her daughter until recently.  NSAIDs: None  Antiplts/Anticoagulants/Anti thrombotics: None  GI Procedures: Colonoscopy 2021 Tubular adenoma of the colon removed  Past Medical History:  Diagnosis Date   Anxiety    Back pain    Colon polyp    Constipation    Degenerative disc disease, lumbar    Depression    Family history of adverse reaction to anesthesia    daughter nausea   Fatty liver    Fatty liver disease, nonalcoholic    GERD (gastroesophageal reflux disease)    H/O spinal fusion    Headache    migraines   Headache, migraine 05/08/2015   Hepatitis    in the 1970s.  Resolved.   High cholesterol    Hypertension    Lower back pain    Polycystic ovarian disease    PONV (postoperative nausea and vomiting)    due to surgery from the 1990's   Restless leg syndrome    Spasm  01/29/2009   Spinal cord stimulator status    Wears dentures    full upper, partial lower    Past Surgical History:  Procedure Laterality Date   APPENDECTOMY  1988   ARTHRODESIS METATARSALPHALANGEAL JOINT (MTPJ) Left 12/14/2014   Procedure: ARTHRODESIS METATARSALPHALANGEAL JOINT (MTPJ) 1ST;  Surgeon: Albertine Patricia, DPM;  Location: [facility];  Service: Podiatry;  Laterality: Left;  LMA   back fusion  2004   lumbar region. had broken facet joint.    BACK SURGERY  [id number]   x3   BLADDER SUSPENSION  2006   BREAST BIOPSY Left 1990's   core Sankar   BREAST SURGERY  2005   lumpectomy   CARPAL TUNNEL RELEASE Bilateral 02/26/07,05/21/07   CATARACT EXTRACTION W/PHACO Right 02/11/2021   Procedure: CATARACT EXTRACTION PHACO AND INTRAOCULAR LENS PLACEMENT ([place]) RIGHT PANOPTIX LENS;  Surgeon: Eulogio Bear, MD;  Location: [facility];  Service: Ophthalmology;  Laterality: Right;  PANOPTIX LENS 1.92 00:17.8   CATARACT EXTRACTION W/PHACO Left 03/04/2021   Procedure: CATARACT EXTRACTION PHACO AND INTRAOCULAR  LENS PLACEMENT ([facility]) LEFT PANOPTIX LENS 2.87 00:30.0;  Surgeon: Eulogio Bear, MD;  Location: [facility];  Service: Ophthalmology;  Laterality: Left;  PANOPTIX LENS   CHOLECYSTECTOMY N/A 05/03/2018   Procedure: LAPAROSCOPIC CHOLECYSTECTOMY WITH INTRAOPERATIVE CHOLANGIOGRAM;  Surgeon: Bary Castilla,  Forest Gleason, MD;  Location: [facility];  Service: General;  Laterality: N/A;   COLONOSCOPY     COLONOSCOPY WITH PROPOFOL N/A 01/10/2020   Procedure: COLONOSCOPY WITH PROPOFOL;  Surgeon: Jonathon Bellows, MD;  [facility];  Service: Gastroenterology;  Laterality: N/A;   COLPORRHAPHY     DILATION AND CURETTAGE OF UTERUS     ECTOPIC PREGNANCY SURGERY  1982   FOOT SURGERY Left 2017   fusion on big toe, hammer toe, bunions   HAMMER TOE SURGERY Left 03/22/2015   Procedure: HAMMER TOE CORRECTION L 2ND AND 3RD ;  Surgeon: Albertine Patricia, DPM;  Location: [facility];  Service: Podiatry;  Laterality: Left;  WITH LOCAL   HERNIA REPAIR Right 1988   inguinal   LAMINECTOMY  1993, 1994   NECK SURGERY  2006   fusion   RECTOCELE REPAIR N/A 08/25/2016   Procedure: POSTERIOR REPAIR (RECTOCELE);  Surgeon: Brayton Mars, MD;  Location: [facility];  Service: Gynecology;  Laterality: N/A;   SPINAL CORD STIMULATOR BATTERY EXCHANGE Right 04/08/2019   Procedure: SPINAL CORD STIMULATOR BATTERY EXCHANGE;  Surgeon: Clydell Hakim, MD;  Location: [facility];  Service: Neurosurgery;  Laterality: Right;  SPINAL CORD STIMULATOR BATTERY EXCHANGE   SPINAL CORD STIMULATOR IMPLANT  2014   stimulator in the ileo inguinal nerve due to scar tissue in abdomen   TONSILLECTOMY  1985   TOTAL ABDOMINAL HYSTERECTOMY      [place]   VENTRAL HERNIA REPAIR N/A 09/01/2018   6.4 cm Ventra lite mesh. HERNIA REPAIR VENTRAL ADULT;  Surgeon: Robert Bellow, MD;  Location: [facility];  Service: General;  Laterality: N/A;     Current Outpatient Medications:    aspirin EC 81 MG tablet, Take 81 mg by mouth at bedtime., Disp: , Rfl:    buPROPion (WELLBUTRIN SR) 150 MG 12 hr tablet, TAKE 1 TABLET BY MOUTH 3 TIMES  DAILY, Disp: 270 tablet, Rfl: 3   Cholecalciferol (VITAMIN D3) 5000 UNITS CAPS, Take 5,000 Units by mouth daily. , Disp: , Rfl:    Coenzyme Q10 200 MG capsule, Take 200 mg by mouth daily., Disp: , Rfl:    cyclobenzaprine (FLEXERIL) 10 MG tablet, Take 1 tablet (10 mg total) by mouth 3 (three) times daily as needed for muscle spasms., Disp: 90 tablet, Rfl: 5   diazepam (VALIUM) 10 MG tablet, TAKE 1 TABLET BY MOUTH EVERY 6 HOURS AS NEEDED FOR ANXIETY., Disp: 120 tablet, Rfl: 3   diclofenac Sodium (VOLTAREN) 1 % GEL, SMARTSIG:1 Gram(s) Topical 4 Times Daily PRN, Disp: 100 g, Rfl: 4   estradiol (ESTRACE) 1 MG tablet, TAKE 1 TABLET BY MOUTH DAILY, Disp: 90 tablet, Rfl: 1   famotidine (PEPCID) 20 MG tablet, Take 1 tablet (20 mg total) by mouth every evening., Disp: 30 tablet, Rfl: 4    fluticasone (FLONASE) 50 MCG/ACT nasal spray, Place 1 spray into both nostrils daily as needed for allergies or rhinitis., Disp: , Rfl:    hydroquinone 4 % cream, Apply 1 application topically daily., Disp: 28.35 g, Rfl: 1   lidocaine (LIDODERM) 5 %, Place 1 patch onto the skin daily. Remove & Discard patch within 12 hours or as directed by MD, Disp: 90 patch, Rfl: 1   LINZESS 145 MCG CAPS capsule, TAKE 1 CAPSULE BY MOUTH  DAILY, Disp: 90 capsule, Rfl: 4   meloxicam (MOBIC) 15 MG tablet, TAKE 1 TABLET BY MOUTH AT  BEDTIME AS NEEDED FOR PAIN, Disp: 90 tablet, Rfl: 3   Multiple Vitamin (  MULTIVITAMIN WITH MINERALS) TABS tablet, Take 1 tablet by mouth daily., Disp: , Rfl:    mupirocin cream (BACTROBAN) 2 %, APPLY TO AFFECTED AREAS TOPICALLY TWICE DAILY, Disp: 15 g, Rfl: 2   mupirocin ointment (BACTROBAN) 2 %, Apply 1 Application topically daily. With dressing changes, Disp: 22 g, Rfl: 0   MYRBETRIQ 25 MG TB24 tablet, TAKE 1 TABLET BY MOUTH  DAILY, Disp: 90 tablet, Rfl: 3   omeprazole (PRILOSEC) 40 MG capsule, Take 1 capsule (40 mg total) by mouth daily as needed (heartburn)., Disp: 90 capsule, Rfl: 4   oxyCODONE (ROXICODONE) 15 MG immediate release tablet, TAKE ONE TO TWO TABLETS EVERY FOUR HOURSAS NEEDED FOR PAIN, Disp: 180 tablet, Rfl: 0   polyethylene glycol (MIRALAX / GLYCOLAX) packet, Take 17 g by mouth 2 (two) times daily as needed for mild constipation (dissolve in an 8 oz glass of water)., Disp: 14 each, Rfl: 1   pramipexole (MIRAPEX) 0.25 MG tablet, Take 2 tablets (0.5 mg total) by mouth at bedtime., Disp: 180 tablet, Rfl: 4   pregabalin (LYRICA) 150 MG capsule, Take 150 mg by mouth 2 (two) times daily., Disp: , Rfl:    RESTASIS 0.05 % ophthalmic emulsion, Place 1 drop into both eyes 2 (two) times daily. , Disp: , Rfl:    rosuvastatin (CRESTOR) 10 MG tablet, TAKE 1 TABLET BY MOUTH DAILY, Disp: 90 tablet, Rfl: 3   sertraline (ZOLOFT) 50 MG tablet, Take 1 tablet (50 mg total) by mouth daily.,  Disp: 90 tablet, Rfl: 3   Turmeric 500 MG CAPS, Take 500 mg by mouth daily. , Disp: , Rfl:    Family History  Problem Relation Age of Onset   Diabetes Father    Hypertension Father    CAD Father    Diabetes Sister        non-Insulin dependent diabetes mellitus   Heart disease Brother    Cancer Daughter        cancer of the cervix at age 66-18   Heart disease Brother    Breast cancer Paternal Aunt    Colon cancer Paternal Uncle    Ovarian cancer Neg Hx      Social History   Tobacco Use   Smoking status: Never   Smokeless tobacco: Never  Vaping Use   Vaping Use: Never used  Substance Use Topics   Alcohol use: Yes    Comment: rare   Drug use: No    Allergies as of 05/07/2022 - Review Complete 05/07/2022  Allergen Reaction Noted   Hydrocodone Itching 12/05/2014   Lisinopril Cough 10/29/2016   Mirtazapine  05/29/2015   Other Nausea And Vomiting 04/28/2018    Review of Systems:    All systems reviewed and negative except where noted in HPI.   Physical Exam:  BP (!) 177/82 (BP Location: Left Arm, Patient Position: Sitting, Cuff Size: Normal)   Pulse 89   Temp 98.1 F (36.7 C) (Oral)   Ht '5\' 8"'$  (1.727 m)   Wt 197 lb 4 oz (89.5 kg)   BMI 29.99 kg/m  No LMP recorded. Patient has had a hysterectomy.  General:   Alert,  Well-developed, well-nourished, pleasant and cooperative in NAD Head:  Normocephalic and atraumatic. Eyes:  Sclera clear, no icterus.   Conjunctiva pink. Ears:  Normal auditory acuity. Nose:  No deformity, discharge, or lesions. Mouth:  No deformity or lesions,oropharynx pink & moist. Neck:  Supple; no masses or thyromegaly. Lungs:  Respirations even and unlabored.  Clear throughout to auscultation.  No wheezes, crackles, or rhonchi. No acute distress. Heart:  Regular rate and rhythm; no murmurs, clicks, rubs, or gallops. Abdomen:  Normal bowel sounds. Soft, non-tender and non-distended without masses, hepatosplenomegaly or hernias noted.  No  guarding or rebound tenderness.   Rectal: Not performed Msk:  Symmetrical without gross deformities. Good, equal movement & strength bilaterally. Pulses:  Normal pulses noted. Extremities:  No clubbing or edema.  No cyanosis. Neurologic:  Alert and oriented x3;  grossly normal neurologically. Skin:  Intact without significant lesions or rashes. No jaundice. Psych:  Alert and cooperative. Normal mood and affect.  Imaging Studies: Reviewed  Assessment and Plan:   SATIN BOAL is a 66 y.o. female with history of cholecystectomy, chronic GERD, is seen in consultation for 1 month history of hoarseness of voice, dysphagia to solids and flareup of GERD symptoms  Recommend increase omeprazole to 40 mg p.o. twice daily before meals Discussed about antireflux lifestyle, information provided Recommend EGD for further evaluation  I have discussed alternative options, risks & benefits,  which include, but are not limited to, bleeding, infection, perforation,respiratory complication & drug reaction.  The patient agrees with this plan & written consent will be obtained.     Follow up in 4 months   Courtney Darby, MD

## 2022-05-07 NOTE — Anesthesia Preprocedure Evaluation (Signed)
Anesthesia Evaluation  Patient identified by MRN, date of birth, ID band Patient awake    Reviewed: Allergy & Precautions, NPO status , Patient's Chart, lab work & pertinent test results, reviewed documented beta blocker date and time   History of Anesthesia Complications (+) PONV and history of anesthetic complications  Airway Mallampati: II  TM Distance: >3 FB Neck ROM: Full    Dental  (+) Upper Dentures, Partial Lower   Pulmonary    breath sounds clear to auscultation       Cardiovascular hypertension, (-) angina (-) DOE  Rhythm:Regular Rate:Normal   HLD   Neuro/Psych  Headaches PSYCHIATRIC DISORDERS Anxiety Depression     S/p spinal cord stimulator  Neuromuscular disease    GI/Hepatic ,GERD  Controlled,,(+) Hepatitis - (Fatty liver)  Endo/Other  Hypothyroidism   PCOS  Renal/GU      Musculoskeletal  (+) Arthritis ,    Abdominal Normal abdominal exam  (+)   Peds  Hematology   Anesthesia Other Findings   Reproductive/Obstetrics                             Anesthesia Physical Anesthesia Plan  ASA: 2  Anesthesia Plan: General   Post-op Pain Management: Minimal or no pain anticipated   Induction: Intravenous  PONV Risk Score and Plan: 3 and TIVA, Treatment may vary due to age or medical condition and Propofol infusion  Airway Management Planned: Natural Airway and Nasal Cannula  Additional Equipment:   Intra-op Plan:   Post-operative Plan:   Informed Consent: I have reviewed the patients History and Physical, chart, labs and discussed the procedure including the risks, benefits and alternatives for the proposed anesthesia with the patient or authorized representative who has indicated his/her understanding and acceptance.     Dental advisory given  Plan Discussed with: CRNA and Anesthesiologist  Anesthesia Plan Comments:         Anesthesia Quick Evaluation

## 2022-05-07 NOTE — Patient Instructions (Signed)
Gastroesophageal Reflux Disease, Adult  Gastroesophageal reflux (GER) happens when acid from the stomach flows up into the tube that connects the mouth and the stomach (esophagus). Normally, food travels down the esophagus and stays in the stomach to be digested. With GER, food and stomach acid sometimes move back up into the esophagus. You may have a disease called gastroesophageal reflux disease (GERD) if the reflux: Happens often. Causes frequent or very bad symptoms. Causes problems such as damage to the esophagus. When this happens, the esophagus becomes sore and swollen. Over time, GERD can make small holes (ulcers) in the lining of the esophagus. What are the causes? This condition is caused by a problem with the muscle between the esophagus and the stomach. When this muscle is weak or not normal, it does not close properly to keep food and acid from coming back up from the stomach. The muscle can be weak because of: Tobacco use. Pregnancy. Having a certain type of hernia (hiatal hernia). Alcohol use. Certain foods and drinks, such as coffee, chocolate, onions, and peppermint. What increases the risk? Being overweight. Having a disease that affects your connective tissue. Taking NSAIDs, such a ibuprofen. What are the signs or symptoms? Heartburn. Difficult or painful swallowing. The feeling of having a lump in the throat. A bitter taste in the mouth. Bad breath. Having a lot of saliva. Having an upset or bloated stomach. Burping. Chest pain. Different conditions can cause chest pain. Make sure you see your doctor if you have chest pain. Shortness of breath or wheezing. A long-term cough or a cough at night. Wearing away of the surface of teeth (tooth enamel). Weight loss. How is this treated? Making changes to your diet. Taking medicine. Having surgery. Treatment will depend on how bad your symptoms are. Follow these instructions at home: Eating and drinking  Follow a  diet as told by your doctor. You may need to avoid foods and drinks such as: Coffee and tea, with or without caffeine. Drinks that contain alcohol. Energy drinks and sports drinks. Bubbly (carbonated) drinks or sodas. Chocolate and cocoa. Peppermint and mint flavorings. Garlic and onions. Horseradish. Spicy and acidic foods. These include peppers, chili powder, curry powder, vinegar, hot sauces, and BBQ sauce. Citrus fruit juices and citrus fruits, such as oranges, lemons, and limes. Tomato-based foods. These include red sauce, chili, salsa, and pizza with red sauce. Fried and fatty foods. These include donuts, french fries, potato chips, and high-fat dressings. High-fat meats. These include hot dogs, rib eye steak, sausage, ham, and bacon. High-fat dairy items, such as whole milk, butter, and cream cheese. Eat small meals often. Avoid eating large meals. Avoid drinking large amounts of liquid with your meals. Avoid eating meals during the 2-3 hours before bedtime. Avoid lying down right after you eat. Do not exercise right after you eat. Lifestyle  Do not smoke or use any products that contain nicotine or tobacco. If you need help quitting, ask your doctor. Try to lower your stress. If you need help doing this, ask your doctor. If you are overweight, lose an amount of weight that is healthy for you. Ask your doctor about a safe weight loss goal. General instructions Pay attention to any changes in your symptoms. Take over-the-counter and prescription medicines only as told by your doctor. Do not take aspirin, ibuprofen, or other NSAIDs unless your doctor says it is okay. Wear loose clothes. Do not wear anything tight around your waist. Raise (elevate) the head of your bed about   6 inches (15 cm). You may need to use a wedge to do this. Avoid bending over if this makes your symptoms worse. Keep all follow-up visits. Contact a doctor if: You have new symptoms. You lose weight and you  do not know why. You have trouble swallowing or it hurts to swallow. You have wheezing or a cough that keeps happening. You have a hoarse voice. Your symptoms do not get better with treatment. Get help right away if: You have sudden pain in your arms, neck, jaw, teeth, or back. You suddenly feel sweaty, dizzy, or light-headed. You have chest pain or shortness of breath. You vomit and the vomit is green, yellow, or black, or it looks like blood or coffee grounds. You faint. Your poop (stool) is red, bloody, or black. You cannot swallow, drink, or eat. These symptoms may represent a serious problem that is an emergency. Do not wait to see if the symptoms will go away. Get medical help right away. Call your local emergency services (911 in the [place] Do not drive yourself to the hospital. Summary If a person has gastroesophageal reflux disease (GERD), food and stomach acid move back up into the esophagus and cause symptoms or problems such as damage to the esophagus. Treatment will depend on how bad your symptoms are. Follow a diet as told by your doctor. Take all medicines only as told by your doctor. This information is not intended to replace advice given to you by your health care provider. Make sure you discuss any questions you have with your health care provider. Document Revised: 12/19/2019 Document Reviewed: 12/19/2019 Elsevier Patient Education  2023 [employer]  

## 2022-05-07 NOTE — Telephone Encounter (Unsigned)
Copied from [place] [id number]. Topic: General - Other >> May 02, 2022 12:51 PM Cyndi Bender wrote: Reason for CRM: Lorriane Shire with [facility] stated they need additional information regarding PA for lidocaine (LIDODERM) 5 %. Cb# [phone]  Reference# [id number]

## 2022-05-08 ENCOUNTER — Encounter
Admission: RE | Disposition: A | Discharge status: Home / Self Care | Payer: Self-pay | Source: Ambulatory Visit | Attending: Gastroenterology

## 2022-05-08 ENCOUNTER — Encounter: Payer: Self-pay | Admitting: Gastroenterology

## 2022-05-08 ENCOUNTER — Ambulatory Visit: Payer: 59 | Admitting: Anesthesiology

## 2022-05-08 ENCOUNTER — Ambulatory Visit
Admission: RE | Admit: 2022-05-08 | Discharge: 2022-05-08 | Disposition: A | Discharge status: Home / Self Care | Payer: 59 | Source: Ambulatory Visit | Attending: Gastroenterology | Admitting: Gastroenterology

## 2022-05-08 ENCOUNTER — Other Ambulatory Visit: Payer: Self-pay

## 2022-05-08 DIAGNOSIS — I10 Essential (primary) hypertension: Secondary | ICD-10-CM | POA: Insufficient documentation

## 2022-05-08 DIAGNOSIS — R1314 Dysphagia, pharyngoesophageal phase: Secondary | ICD-10-CM | POA: Diagnosis present

## 2022-05-08 DIAGNOSIS — K219 Gastro-esophageal reflux disease without esophagitis: Secondary | ICD-10-CM

## 2022-05-08 DIAGNOSIS — K3189 Other diseases of stomach and duodenum: Secondary | ICD-10-CM | POA: Diagnosis not present

## 2022-05-08 DIAGNOSIS — F418 Other specified anxiety disorders: Secondary | ICD-10-CM | POA: Insufficient documentation

## 2022-05-08 DIAGNOSIS — Z8249 Family history of ischemic heart disease and other diseases of the circulatory system: Secondary | ICD-10-CM | POA: Insufficient documentation

## 2022-05-08 DIAGNOSIS — K449 Diaphragmatic hernia without obstruction or gangrene: Secondary | ICD-10-CM | POA: Insufficient documentation

## 2022-05-08 DIAGNOSIS — R1319 Other dysphagia: Secondary | ICD-10-CM | POA: Diagnosis not present

## 2022-05-08 DIAGNOSIS — E282 Polycystic ovarian syndrome: Secondary | ICD-10-CM | POA: Diagnosis not present

## 2022-05-08 DIAGNOSIS — Z8711 Personal history of peptic ulcer disease: Secondary | ICD-10-CM | POA: Diagnosis not present

## 2022-05-08 DIAGNOSIS — K2980 Duodenitis without bleeding: Secondary | ICD-10-CM | POA: Diagnosis not present

## 2022-05-08 DIAGNOSIS — K2210 Ulcer of esophagus without bleeding: Secondary | ICD-10-CM

## 2022-05-08 DIAGNOSIS — E039 Hypothyroidism, unspecified: Secondary | ICD-10-CM | POA: Insufficient documentation

## 2022-05-08 DIAGNOSIS — M1990 Unspecified osteoarthritis, unspecified site: Secondary | ICD-10-CM | POA: Insufficient documentation

## 2022-05-08 DIAGNOSIS — K2100 Gastro-esophageal reflux disease with esophagitis, without bleeding: Secondary | ICD-10-CM | POA: Diagnosis not present

## 2022-05-08 DIAGNOSIS — K760 Fatty (change of) liver, not elsewhere classified: Secondary | ICD-10-CM | POA: Diagnosis not present

## 2022-05-08 DIAGNOSIS — G709 Myoneural disorder, unspecified: Secondary | ICD-10-CM | POA: Insufficient documentation

## 2022-05-08 DIAGNOSIS — R1013 Epigastric pain: Secondary | ICD-10-CM | POA: Diagnosis present

## 2022-05-08 HISTORY — PX: ESOPHAGOGASTRODUODENOSCOPY (EGD) WITH PROPOFOL: SHX5813

## 2022-05-08 SURGERY — ESOPHAGOGASTRODUODENOSCOPY (EGD) WITH PROPOFOL
Anesthesia: General | Site: Mouth

## 2022-05-08 MED ORDER — LIDOCAINE HCL (CARDIAC) PF 100 MG/5ML IV SOSY
PREFILLED_SYRINGE | INTRAVENOUS | Status: DC | PRN
  Administered 2022-05-08: 60 mg via INTRAVENOUS

## 2022-05-08 MED ORDER — LACTATED RINGERS IV SOLN: INTRAVENOUS | Status: DC

## 2022-05-08 MED ORDER — STERILE WATER FOR IRRIGATION IR SOLN
Status: DC | PRN
  Administered 2022-05-08: 50 mL

## 2022-05-08 MED ORDER — PROPOFOL 10 MG/ML IV BOLUS
INTRAVENOUS | Status: DC | PRN
  Administered 2022-05-08: 40 mg via INTRAVENOUS
  Administered 2022-05-08: 20 mg via INTRAVENOUS
  Administered 2022-05-08: 100 mg via INTRAVENOUS
  Administered 2022-05-08: 20 mg via INTRAVENOUS

## 2022-05-08 MED ORDER — OMEPRAZOLE 40 MG PO CPDR: 40.0000 mg | DELAYED_RELEASE_CAPSULE | Freq: Two times a day (BID) | ORAL | 2 refills | Status: DC

## 2022-05-08 MED ORDER — SODIUM CHLORIDE 0.9 % IV SOLN: INTRAVENOUS | Status: DC

## 2022-05-08 MED ORDER — GLYCOPYRROLATE 0.2 MG/ML IJ SOLN
INTRAMUSCULAR | Status: DC | PRN
  Administered 2022-05-08: .2 mg via INTRAVENOUS

## 2022-05-08 SURGICAL SUPPLY — 8 items
BLOCK BITE 60FR ADLT L/F GRN (MISCELLANEOUS) ×1 IMPLANT
FORCEPS BIOP RAD 4 LRG CAP 4 (CUTTING FORCEPS) IMPLANT
GOWN CVR UNV OPN BCK APRN NK (MISCELLANEOUS) ×2 IMPLANT
GOWN ISOL THUMB LOOP REG UNIV (MISCELLANEOUS) ×2
KIT PRC NS LF DISP ENDO (KITS) ×1 IMPLANT
KIT PROCEDURE OLYMPUS (KITS) ×1
MANIFOLD NEPTUNE II (INSTRUMENTS) ×1 IMPLANT
WATER STERILE IRR 250ML POUR (IV SOLUTION) ×1 IMPLANT

## 2022-05-08 NOTE — Anesthesia Postprocedure Evaluation (Signed)
Anesthesia Post Note  Patient: Courtney Ross  Procedure(s) Performed: ESOPHAGOGASTRODUODENOSCOPY (EGD) WITH BIOPSY (Mouth)  Patient location during evaluation: PACU Anesthesia Type: General Level of consciousness: awake and alert Pain management: pain level controlled Vital Signs Assessment: post-procedure vital signs reviewed and stable Respiratory status: spontaneous breathing, nonlabored ventilation and respiratory function stable Cardiovascular status: blood pressure returned to baseline and stable Postop Assessment: no apparent nausea or vomiting Anesthetic complications: no   No notable events documented.   Last Vitals:  Vitals:   05/08/22 1336 05/08/22 1346  BP: (!) 116/51 133/71  Pulse: 78 74  Resp: (!) 27 14  Temp:  (!) 36.4 C  SpO2: 92% 94%    Last Pain:  Vitals:   05/08/22 1346  TempSrc:   PainSc: 0-No pain                 [place] Ouch

## 2022-05-08 NOTE — Transfer of Care (Signed)
Immediate Anesthesia Transfer of Care Note  Patient: Courtney Ross  Procedure(s) Performed: ESOPHAGOGASTRODUODENOSCOPY (EGD) WITH BIOPSY (Mouth)  Patient Location: PACU  Anesthesia Type: General  Level of Consciousness: awake, alert  and patient cooperative  Airway and Oxygen Therapy: Patient Spontanous Breathing and Patient connected to supplemental oxygen  Post-op Assessment: Post-op Vital signs reviewed, Patient's Cardiovascular Status Stable, Respiratory Function Stable, Patent Airway and No signs of Nausea or vomiting  Post-op Vital Signs: Reviewed and stable  Complications: No notable events documented.

## 2022-05-08 NOTE — Telephone Encounter (Signed)
Per chart, fax from [facility] requesting additional information was received, completed  and faxed back on 05/06/2022 (seen media section).

## 2022-05-08 NOTE — H&P (Signed)
Courtney Darby, MD [address]  [place]  [place], [place] [address]  Main: [phone]  Fax: [phone] Pager: [phone]  Primary Care Physician:  Birdie Sons, MD Primary Gastroenterologist:  Dr. Cephas Ross  Pre-Procedure History & Physical: HPI:  Courtney Ross is a 66 y.o. female is here for an endoscopy.   Past Medical History:  Diagnosis Date   Anxiety    Back pain    Colon polyp    Constipation    Degenerative disc disease, lumbar    Depression    Family history of adverse reaction to anesthesia    daughter nausea   Fatty liver    Fatty liver disease, nonalcoholic    GERD (gastroesophageal reflux disease)    H/O spinal fusion    Headache    migraines   Headache, migraine 05/08/2015   Hepatitis    in the 1970s.  Resolved.   High cholesterol    Hypertension    Lower back pain    Polycystic ovarian disease    PONV (postoperative nausea and vomiting)    due to surgery from the 1990's   Restless leg syndrome    Spasm 01/29/2009   Spinal cord stimulator status    Wears dentures    full upper, partial lower    Past Surgical History:  Procedure Laterality Date   APPENDECTOMY  1988   ARTHRODESIS METATARSALPHALANGEAL JOINT (MTPJ) Left 12/14/2014   Procedure: ARTHRODESIS METATARSALPHALANGEAL JOINT (MTPJ) 1ST;  Surgeon: Albertine Patricia, DPM;  Location: [place];  Service: Podiatry;  Laterality: Left;  LMA   back fusion  2004   lumbar region. had broken facet joint.    BACK SURGERY  [phone]   x3   BLADDER SUSPENSION  2006   BREAST BIOPSY Left 1990's   core Sankar   BREAST SURGERY  2005   lumpectomy   CARPAL TUNNEL RELEASE Bilateral 02/26/07,05/21/07   CATARACT EXTRACTION W/PHACO Right 02/11/2021   Procedure: CATARACT EXTRACTION PHACO AND INTRAOCULAR LENS PLACEMENT ([place]) RIGHT PANOPTIX LENS;  Surgeon: Eulogio Bear, MD;  Location: [place];  Service: Ophthalmology;  Laterality: Right;  PANOPTIX  LENS 1.92 00:17.8   CATARACT EXTRACTION W/PHACO Left 03/04/2021   Procedure: CATARACT EXTRACTION PHACO AND INTRAOCULAR  LENS PLACEMENT ([facility]) LEFT PANOPTIX LENS 2.87 00:30.0;  Surgeon: Eulogio Bear, MD;  Location: [place];  Service: Ophthalmology;  Laterality: Left;  PANOPTIX LENS   CHOLECYSTECTOMY N/A 05/03/2018   Procedure: LAPAROSCOPIC CHOLECYSTECTOMY WITH INTRAOPERATIVE CHOLANGIOGRAM;  Surgeon: Robert Bellow, MD;  Location: [facility];  Service: General;  Laterality: N/A;   COLONOSCOPY     COLONOSCOPY WITH PROPOFOL N/A 01/10/2020   Procedure: COLONOSCOPY WITH PROPOFOL;  Surgeon: Jonathon Bellows, MD;  [facility] END[facility]COPY;  Service: Gastroenterology;  Laterality: N/A;   COLPORRHAPHY     DILATION AND CURETTAGE OF UTERUS     ECTOPIC PREGNANCY SURGERY  1982   FOOT SURGERY Left 2017   fusion on big toe, hammer toe, bunions   HAMMER TOE SURGERY Left 03/22/2015   Procedure: HAMMER TOE CORRECTION L 2ND AND 3RD ;  Surgeon: Albertine Patricia, DPM;  Location: [place];  Service: Podiatry;  Laterality: Left;  WITH LOCAL   HERNIA REPAIR Right 1988   inguinal   LAMINECTOMY  1993, 1994   NECK SURGERY  2006   fusion   RECTOCELE REPAIR N/A 08/25/2016   Procedure: POSTERIOR REPAIR (RECTOCELE);  Surgeon: Brayton Mars, MD;  Location: [facility];  Service: Gynecology;  Laterality: N/A;   SPINAL CORD STIMULATOR BATTERY EXCHANGE Right 04/08/2019   Procedure: SPINAL CORD STIMULATOR BATTERY EXCHANGE;  Surgeon: Clydell Hakim, MD;  Location: [place];  Service: Neurosurgery;  Laterality: Right;  SPINAL CORD STIMULATOR BATTERY EXCHANGE   SPINAL CORD STIMULATOR IMPLANT  2014   stimulator in the ileo inguinal nerve due to scar tissue in abdomen   TONSILLECTOMY  1985   [place]OTAL [place]BD[place]ST[place]   VENTRAL HERNIA REPAIR N/A 09/01/2018   6.4 cm Ventra lite mesh. HERNIA REPAIR VENTRAL ADULT;  Surgeon: Robert Bellow, MD;  Location: [facility];  Service: General;  Laterality:  N/A;    Prior to Admission medications   Medication Sig Start Date End Date Taking? Authorizing Provider  aspirin EC 81 MG tablet Take 81 mg by mouth at bedtime.   Yes [provider]  buPROPion (WELLBUTRIN SR) 150 MG 12 hr tablet TAKE 1 TABLET BY MOUTH 3 TIMES  DAILY 09/30/21  Yes Birdie Sons, MD  Cholecalciferol (VITAMIN D3) 5000 UNITS CAPS Take 5,000 Units by mouth daily.    Yes [provider]  Coenzyme Q10 200 MG capsule Take 200 mg by mouth daily.   Yes [provider]  cyclobenzaprine (FLEXERIL) 10 MG tablet Take 1 tablet (10 mg total) by mouth 3 (three) times daily as needed for muscle spasms. 08/23/21  Yes Birdie Sons, MD  diazepam (VALIUM) 10 MG tablet TAKE 1 TABLET BY MOUTH EVERY 6 HOURS AS NEEDED FOR ANXIETY. 01/21/22  Yes Birdie Sons, MD  diclofenac Sodium (VOLTAREN) 1 % GEL SMARTSIG:1 Gram(s) Topical 4 Times Daily PRN 04/24/21  Yes Birdie Sons, MD  estradiol (ESTRACE) 1 MG tablet TAKE 1 TABLET BY MOUTH DAILY 02/04/22  Yes Birdie Sons, MD  famotidine (PEPCID) 20 MG tablet Take 1 tablet (20 mg total) by mouth every evening. 05/05/22  Yes Birdie Sons, MD  fluticasone (FLONASE) 50 MCG/ACT nasal spray Place 1 spray into both nostrils daily as needed for allergies or rhinitis.   Yes [provider]  hydroquinone 4 % cream Apply 1 application topically daily. 11/11/19  Yes Rubie Maid, MD  lidocaine (LIDODERM) 5 % Place 1 patch onto the skin daily. Remove & Discard patch within 12 hours or as directed by MD 04/28/22  Yes Birdie Sons, MD  LINZESS 145 MCG CAPS capsule TAKE 1 CAPSULE BY MOUTH  DAILY 02/22/22  Yes Birdie Sons, MD  meloxicam (MOBIC) 15 MG tablet TAKE 1 TABLET BY MOUTH AT  BEDTIME AS NEEDED FOR PAIN 12/27/21  Yes Birdie Sons, MD  Multiple Vitamin (MULTIVITAMIN WITH MINERALS) TABS tablet Take 1 tablet by mouth daily.   Yes [provider]  MYRBETRIQ 25 MG TB24 tablet TAKE 1 TABLET BY MOUTH  DAILY  12/27/21  Yes Birdie Sons, MD  omeprazole (PRILOSEC) 40 MG capsule Take 1 capsule (40 mg total) by mouth daily as needed (heartburn). 02/18/21  Yes Birdie Sons, MD  oxyCODONE (ROXICODONE) 15 MG immediate release tablet TAKE ONE TO TWO TABLETS EVERY FOUR HOURSAS NEEDED FOR PAIN 04/22/22  Yes Birdie Sons, MD  polyethylene glycol (MIRALAX / GLYCOLAX) packet Take 17 g by mouth 2 (two) times daily as needed for mild constipation (dissolve in an 8 oz glass of water). 07/21/18  Yes Birdie Sons, MD  pramipexole (MIRAPEX) 0.25 MG tablet Take 2 tablets (0.5 mg total) by mouth at bedtime. 04/01/22  Yes Birdie Sons, MD  pregabalin (LYRICA) 150 MG capsule Take 150 mg by mouth 2 (two) times daily. 02/07/21  Yes [provider]  RESTASIS 0.05 % ophthalmic emulsion Place 1 drop into both eyes 2 (two) times daily.  11/21/15  Yes [provider]  rosuvastatin (CRESTOR) 10 MG tablet TAKE 1 TABLET BY MOUTH DAILY 11/05/21  Yes Birdie Sons, MD  sertraline (ZOLOFT) 50 MG tablet Take 1 tablet (50 mg total) by mouth daily. 10/14/21  Yes Birdie Sons, MD    Allergies as of 05/07/2022 - Review Complete 05/07/2022  Allergen Reaction Noted   Hydrocodone Itching 12/05/2014   Lisinopril Cough 10/29/2016   Mirtazapine  05/29/2015   Other Nausea And Vomiting 04/28/2018    Family History  Problem Relation Age of Onset   Diabetes Father    Hypertension Father    CAD Father    Diabetes Sister        non-Insulin dependent diabetes mellitus   Heart disease Brother    Cancer Daughter        cancer of the cervix at age 39-18   Heart disease Brother    Breast cancer Paternal Aunt    Colon cancer Paternal Uncle    Ovarian cancer Neg Hx     Social History   Socioeconomic History   Marital status: Legally Separated    Spouse name: Not on file   Number of children: 4   Years of education: Not on file   Highest education level: 9th grade  Occupational History    Comment:  disabled  Tobacco Use   Smoking status: Never   Smokeless tobacco: Never  Vaping Use   Vaping Use: Never used  Substance and Sexual Activity   Alcohol use: Not Currently    Comment: rare   Drug use: No   Sexual activity: Yes    Birth control/protection: Surgical  Other Topics Concern   Not on file  Social History Narrative   Pt is currently receiving assistance from [employer] and gets food stamps.    Social Determinants of Health   Financial Resource Strain: Low Risk  (07/15/2021)   Overall Financial Resource Strain (CARDIA)    Difficulty of Paying Living Expenses: Not very hard  Food Insecurity: Food Insecurity Present (07/15/2021)   Hunger Vital Sign    Worried About Running Out of Food in the Last Year: Sometimes true    Ran Out of Food in the Last Year: Never true  Transportation Needs: No Transportation Needs (07/15/2021)   PRAPARE - Hydrologist (Medical): No    Lack of Transportation (Non-Medical): No  Physical Activity: Insufficiently Active (07/15/2021)   Exercise Vital Sign    Days of Exercise per Week: 4 days    Minutes of Exercise per Session: 10 min  Stress: No Stress Concern Present (07/15/2021)   [place]    Feeling of Stress : Not at all  Social Connections: Moderately Integrated (07/15/2021)   Social Connection and Isolation Panel [NHANES]    Frequency of Communication with Friends and Family: More than three times a week    Frequency of Social Gatherings with Friends and Family: Twice a week    Attends Religious Services: More than 4 times per year    Active Member of [employer] or Organizations: Yes    Attends Music therapist: More than 4 times per year    Marital Status: Divorced  Human resources officer Violence: Not At Risk (  07/15/2021)   Humiliation, Afraid, Rape, and Kick questionnaire    Fear of Current or Ex-Partner: No    Emotionally Abused: No     Physically Abused: No    Sexually Abused: No    Review of Systems: See HPI, otherwise negative ROS  Physical Exam: BP 124/72   Temp 99.1 F (37.3 C) (Tympanic)   Resp 10   Ht 5' 7.99" (1.727 m)   Wt 88.4 kg   SpO2 95%   BMI 29.64 kg/m  General:   Alert,  pleasant and cooperative in NAD Head:  Normocephalic and atraumatic. Neck:  Supple; no masses or thyromegaly. Lungs:  Clear throughout to auscultation.    Heart:  Regular rate and rhythm. Abdomen:  Soft, nontender and nondistended. Normal bowel sounds, without guarding, and without rebound.   Neurologic:  Alert and  oriented x4;  grossly normal neurologically.  Impression/Plan: Courtney Ross is here for an endoscopy to be performed for dysphagia to solids and flareup of GERD symptoms   Risks, benefits, limitations, and alternatives regarding  endoscopy have been reviewed with the patient.  Questions have been answered.  All parties agreeable.   Sherri Sear, MD  05/08/2022, 1:10 PM

## 2022-05-08 NOTE — Op Note (Signed)
[facility] Gastroenterology Patient Name: Courtney Ross Procedure Date: 05/08/2022 1:04 PM MRN: [id number] Account #: [id number] Date of Birth: 01-07-1956 Admit Type: Outpatient Age: 66 Room: [facility] OR ROOM 01 Gender: Female Note Status: Finalized Instrument Name: [id number] Procedure:             Upper GI endoscopy Indications:           Epigastric abdominal pain, Esophageal dysphagia,                         Esophageal reflux symptoms that persist despite                         appropriate therapy Providers:             Lin Landsman MD, MD Referring MD:          Kirstie Peri. Caryn Section, MD (Referring MD) Medicines:             General Anesthesia Complications:         No immediate complications. Estimated blood loss:                         Minimal. Procedure:             Pre-Anesthesia Assessment:                        - Prior to the procedure, a History and Physical was                         performed, and patient medications and allergies were                         reviewed. The patient is competent. The risks and                         benefits of the procedure and the sedation options and                         risks were discussed with the patient. All questions                         were answered and informed consent was obtained.                         Patient identification and proposed procedure were                         verified by the physician, the nurse, the                         anesthesiologist, the anesthetist and the technician                         in the pre-procedure area in the procedure room in the                         endoscopy suite. Mental Status Examination: alert and  oriented. Airway Examination: normal oropharyngeal                         airway and neck mobility. Respiratory Examination:                         clear to auscultation. CV Examination: normal.                          Prophylactic Antibiotics: The patient does not require                         prophylactic antibiotics. Prior Anticoagulants: The                         patient has taken no anticoagulant or antiplatelet                         agents. ASA Grade Assessment: II - A patient with mild                         systemic disease. After reviewing the risks and                         benefits, the patient was deemed in satisfactory                         condition to undergo the procedure. The anesthesia                         plan was to use general anesthesia. Immediately prior                         to administration of medications, the patient was                         re-assessed for adequacy to receive sedatives. The                         heart rate, respiratory rate, oxygen saturations,                         blood pressure, adequacy of pulmonary ventilation, and                         response to care were monitored throughout the                         procedure. The physical status of the patient was                         re-assessed after the procedure.                        After obtaining informed consent, the endoscope was                         passed under direct vision. Throughout the procedure,  the patient's blood pressure, pulse, and oxygen                         saturations were monitored continuously. The Endoscope                         was introduced through the mouth, and advanced to the                         third part of duodenum. The upper GI endoscopy was                         accomplished without difficulty. The patient tolerated                         the procedure well. Findings:      Diffuse mildly scalloped mucosa was found in the second portion of the       duodenum. Biopsies for histology were taken with a cold forceps for       evaluation of celiac disease.      The duodenal bulb was normal.      A 5 mm healed  ulcer was found on the greater curvature of the gastric       body. The scar tissue was healthy in appearance. Biopsies were taken       with a cold forceps for histology. Estimated blood loss: none.      The gastric body, incisura and gastric antrum were normal. Biopsies were       taken with a cold forceps for histology.      The cardia and gastric fundus were normal on retroflexion.      A small hiatal hernia was present.      LA Grade A (one or more mucosal breaks less than 5 mm, not extending       between tops of 2 mucosal folds) esophagitis with no bleeding was found       at the gastroesophageal junction. Biopsies were taken with a cold       forceps for histology.      The examined esophagus was normal. Impression:            - Scalloped mucosa was found in the duodenum, rule out                         celiac disease. Biopsied.                        - Normal duodenal bulb.                        - Scar in the gastric body (greater curvature).                         Biopsied.                        - Normal gastric body, incisura and antrum. Biopsied.                        - Small hiatal hernia.                        -  LA Grade A reflux esophagitis with no bleeding.                         Biopsied.                        - Normal esophagus. Recommendation:        - Await pathology results.                        - Discharge patient to home (with escort).                        - Resume previous diet today.                        - Continue present medications.                        - Follow an antireflux regimen.                        - Use Prilosec (omeprazole) 40 mg PO BID for 1 month. Procedure Code(s):     --- Professional ---                        [phone], Esophagogastroduodenoscopy, flexible,                         transoral; with biopsy, single or multiple Diagnosis Code(s):     --- Professional ---                        K31.89, Other diseases of stomach and  duodenum                        K44.9, Diaphragmatic hernia without obstruction or                         gangrene                        K21.00, Gastro-esophageal reflux disease with                         esophagitis, without bleeding                        R10.13, Epigastric pain                        R13.14, Dysphagia, pharyngoesophageal phase CPT copyright 2022 American Medical Association. All rights reserved. The codes documented in this report are preliminary and upon coder review may  be revised to meet current compliance requirements. Dr. Ulyess Mort Lin Landsman MD, MD 05/08/2022 1:35:00 PM This report has been signed electronically. Number of Addenda: 0 Note Initiated On: 05/08/2022 1:04 PM Total Procedure Duration: 0 hours 10 minutes 41 seconds  Estimated Blood Loss:  Estimated blood loss was minimal.      [facility]

## 2022-05-09 ENCOUNTER — Encounter: Payer: Self-pay | Admitting: Gastroenterology

## 2022-05-12 ENCOUNTER — Other Ambulatory Visit: Payer: Self-pay | Admitting: Family Medicine

## 2022-05-12 DIAGNOSIS — M5137 Other intervertebral disc degeneration, lumbosacral region: Secondary | ICD-10-CM

## 2022-05-12 LAB — SURGICAL PATHOLOGY

## 2022-05-12 MED ORDER — LIDOCAINE 5 % EX PTCH: 1.0000 | MEDICATED_PATCH | CUTANEOUS | 1 refills | Status: DC

## 2022-05-12 NOTE — Telephone Encounter (Signed)
[facility] was finally able to approve the lidocaine (LIDODERM) 5 % but they need a new RX sent to them / please advise   [phone]/ order# [id number]

## 2022-05-13 ENCOUNTER — Telehealth: Payer: Self-pay

## 2022-05-13 NOTE — Telephone Encounter (Signed)
Called and patient verbalized understanding of results  °

## 2022-05-13 NOTE — Telephone Encounter (Signed)
-----   Message from Lin Landsman, MD sent at 05/13/2022  9:19 AM EST ----- Please inform patient that the pathology results from upper endoscopy confirms that she has reflux esophagitis.  The stomach biopsies came back normal.  Continue omeprazole 40 mg twice daily for 1 to 2 months and if her symptoms improve, can decrease to once a day.  I will see her for follow-up as scheduled  Rohini Vanga

## 2022-05-21 ENCOUNTER — Ambulatory Visit (INDEPENDENT_AMBULATORY_CARE_PROVIDER_SITE_OTHER): Payer: 59 | Admitting: Dermatology

## 2022-05-21 ENCOUNTER — Other Ambulatory Visit: Payer: Self-pay | Admitting: Family Medicine

## 2022-05-21 VITALS — BP 141/86 | HR 96

## 2022-05-21 DIAGNOSIS — L988 Other specified disorders of the skin and subcutaneous tissue: Secondary | ICD-10-CM

## 2022-05-21 DIAGNOSIS — D2362 Other benign neoplasm of skin of left upper limb, including shoulder: Secondary | ICD-10-CM | POA: Diagnosis not present

## 2022-05-21 DIAGNOSIS — D492 Neoplasm of unspecified behavior of bone, soft tissue, and skin: Secondary | ICD-10-CM

## 2022-05-21 NOTE — Progress Notes (Signed)
Follow-Up Visit   Subjective  Courtney Ross is a 66 y.o. female who presents for the following: Facial Elastosis (Face, pt presents for botox) and check spot (L arm, 26yr, gotten scaly).  The following portions of the chart were reviewed this encounter and updated as appropriate:   Tobacco  Allergies  Meds  Problems  Med Hx  Surg Hx  Fam Hx     Review of Systems:  No other skin or systemic complaints except as noted in HPI or Assessment and Plan.  Objective  Well appearing patient in no apparent distress; mood and affect are within normal limits.  A focused examination was performed including face. Relevant physical exam findings are noted in the Assessment and Plan.  face Rhytides and volume loss.                                  L wrist Rough brown patch with underlying induration ~1.2cm      Assessment & Plan  Elastosis of skin face  Botox 22.5 units injected today to: - Frown complex 20 units - Botox commas 1.25 units x 2  Discussed adding filler to botox comma if doesn't correct with botox  Discussed filler to nasolabial folds, corners of mouth, chin crease, recommend Restylane Refyne  Restylane Refyne 1cc injected to: - Nasolabial folds - bil corners of mouth - chin crease  Botox Injection - face Location: frown complex, botox commas bil  Informed consent: Discussed risks (infection, pain, bleeding, bruising, swelling, allergic reaction, paralysis of nearby muscles, eyelid droop, double vision, neck weakness, difficulty breathing, headache, undesirable cosmetic result, and need for additional treatment) and benefits of the procedure, as well as the alternatives.  Informed consent was obtained.  Preparation: The area was cleansed with alcohol.  Procedure Details:  Botox was injected into the dermis with a 30-gauge needle. Pressure applied to any bleeding. Ice packs offered for swelling.  Lot Number:   [id number]:  07/2024  Total Units Injected:  22.5  Plan: Patient was instructed to remain upright for 4 hours. Patient was instructed to avoid massaging the face and avoid vigorous exercise for the rest of the day. Tylenol may be used for headache.  Allow 2 weeks before returning to clinic for additional dosing as needed. Patient will call for any problems.   Filling material injection - face Prior to the procedure, the patient's past medical history, allergies and the rare but potential risks and complications were reviewed with the patient and a signed consent was obtained. Pre and post-treatment care was discussed and instructions provided.  Location: nasolabial folds, corners of mouth, and chin  Filler Type: Restylane Refyne lot [id number] 02/20/2023  Procedure: The area was prepped thoroughly with Puracyn. After introducing the needle into the desired treatment area, the syringe plunger was drawn back to ensure there was no flash of blood prior to injecting the filler in order to minimize risk of intravascular injection and vascular occlusion. After injection of the filler, the treated areas were cleansed and iced to reduce swelling. Post-treatment instructions were reviewed with the patient.       Patient tolerated the procedure well. The patient will call with any problems, questions or concerns prior to their next appointment.   Neoplasm of skin L wrist  Skin / nail biopsy Type of biopsy: punch   Informed consent: discussed and consent obtained   Timeout: patient name, date of birth,  surgical site, and procedure verified   Procedure prep:  Patient was prepped and draped in usual sterile fashion (The patient was cleaned and prepped) Prep type:  Isopropyl alcohol Anesthesia: the lesion was anesthetized in a standard fashion   Anesthetic:  1% lidocaine w/ epinephrine 1-100,000 buffered w/ 8.4% NaHCO3 Punch size:  3 mm Suture size:  4-0 Suture type: nylon   Hemostasis  achieved with: suture, pressure and aluminum chloride   Outcome: patient tolerated procedure well   Post-procedure details: sterile dressing applied and wound care instructions given   Dressing type: bandage, pressure dressing and bacitracin    Specimen 1 - Surgical pathology Differential Diagnosis: D48.5 ISK vs Cyst vs Dermatofibroma vs other  Check Margins: yes Rough brown patch with underlying induration ~1.2cm   Return in about 1 week (around 05/28/2022) for suture removal, 3-14mBotox.  I, SOthelia Pulling RMA, am acting as scribe for DSarina Ser MD . Documentation: I have reviewed the above documentation for accuracy and completeness, and I agree with the above.  DSarina Ser MD

## 2022-05-21 NOTE — Telephone Encounter (Signed)
Requested medication (s) are due for refill today: routing for review  Requested medication (s) are on the active medication list: yes  Last refill:  04/22/22  Future visit scheduled: yes  Notes to clinic:  Unable to refill per protocol, cannot delegate.      Requested Prescriptions  Pending Prescriptions Disp Refills   oxyCODONE (ROXICODONE) 15 MG immediate release tablet 180 tablet 0     Not Delegated - Analgesics:  Opioid Agonists Failed - 05/21/2022 10:19 AM      Failed - This refill cannot be delegated      Failed - Urine Drug Screen completed in last 360 days      Passed - Valid encounter within last 3 months    Recent Outpatient Visits           2 weeks ago Hoarseness, persistent   [facility] Birdie Sons, MD   8 months ago Chronic lumbar radicular pain (Right side)   [facility] Birdie Sons, MD   1 year ago Restless legs syndrome   [facility] Birdie Sons, MD   1 year ago Essential hypertension   [facility] Birdie Sons, MD   2 years ago Vitamin D deficiency   [place], Kirstie Peri, MD       Future Appointments             In 3 months Vanga, Tally Due, MD [place]

## 2022-05-21 NOTE — Telephone Encounter (Signed)
Medication Refill - Medication: oxyCODONE (ROXICODONE) 15 MG immediate release tablet   Has the patient contacted their pharmacy? No. No, more refills.   (Agent: If no, request that the patient contact the pharmacy for the refill. If patient does not wish to contact the pharmacy document the reason why and proceed with request.)   Preferred Pharmacy (with phone number or street name):  [place] [place] [facility] [place]  [place] [address]  Phone: [phone] Fax: [phone]  Hours: Not open 24 hours   Has the patient been seen for an appointment in the last year OR does the patient have an upcoming appointment? Yes.    Agent: Please be advised that RX refills may take up to 3 business days. We ask that you follow-up with your pharmacy.

## 2022-05-21 NOTE — Patient Instructions (Addendum)
Wound Care Instructions  Cleanse wound gently with soap and water once a day then pat dry with clean gauze. Apply a thin coat of Petrolatum (petroleum jelly, "Vaseline") over the wound (unless you have an allergy to this). We recommend that you use a new, sterile tube of Vaseline. Do not pick or remove scabs. Do not remove the yellow or white "healing tissue" from the base of the wound.  Cover the wound with fresh, clean, nonstick gauze and secure with paper tape. You may use Band-Aids in place of gauze and tape if the wound is small enough, but would recommend trimming much of the tape off as there is often too much. Sometimes Band-Aids can irritate the skin.  You should call the office for your biopsy report after 1 week if you have not already been contacted.  If you experience any problems, such as abnormal amounts of bleeding, swelling, significant bruising, significant pain, or evidence of infection, please call the office immediately.  FOR ADULT SURGERY PATIENTS: If you need something for pain relief you may take 1 extra strength Tylenol (acetaminophen) AND 2 Ibuprofen (200mg each) together every 4 hours as needed for pain. (do not take these if you are allergic to them or if you have a reason you should not take them.) Typically, you may only need pain medication for 1 to 3 days.     Due to recent changes in healthcare laws, you may see results of your pathology and/or laboratory studies on MyChart before the doctors have had a chance to review them. We understand that in some cases there may be results that are confusing or concerning to you. Please understand that not all results are received at the same time and often the doctors may need to interpret multiple results in order to provide you with the best plan of care or course of treatment. Therefore, we ask that you please give us 2 business days to thoroughly review all your results before contacting the office for clarification. Should  we see a critical lab result, you will be contacted sooner.   If You Need Anything After Your Visit  If you have any questions or concerns for your doctor, please call our main line at [phone] and press option 4 to reach your doctor's medical assistant. If no one answers, please leave a voicemail as directed and we will return your call as soon as possible. Messages left after 4 pm will be answered the following business day.   You may also send us a message via MyChart. We typically respond to MyChart messages within 1-2 business days.  For prescription refills, please ask your pharmacy to contact our office. Our fax number is [phone].  If you have an urgent issue when the clinic is closed that cannot wait until the next business day, you can page your doctor at the number below.    Please note that while we do our best to be available for urgent issues outside of office hours, we are not available 24/7.   If you have an urgent issue and are unable to reach us, you may choose to seek medical care at your doctor's office, retail clinic, urgent care center, or emergency room.  If you have a medical emergency, please immediately call 911 or go to the emergency department.  Pager Numbers  - Dr. Kowalski: [phone]  - Dr. Moye: [phone]  - Dr. Stewart: [phone]  In the event of inclement weather, please call our main line at   [phone] for an update on the status of any delays or closures.  Dermatology Medication Tips: Please keep the boxes that topical medications come in in order to help keep track of the instructions about where and how to use these. Pharmacies typically print the medication instructions only on the boxes and not directly on the medication tubes.   If your medication is too expensive, please contact our office at [phone] option 4 or send us a message through MyChart.   We are unable to tell what your co-pay for medications will be in  advance as this is different depending on your insurance coverage. However, we may be able to find a substitute medication at lower cost or fill out paperwork to get insurance to cover a needed medication.   If a prior authorization is required to get your medication covered by your insurance company, please allow us 1-2 business days to complete this process.  Drug prices often vary depending on where the prescription is filled and some pharmacies may offer cheaper prices.  The website www.goodrx.com contains coupons for medications through different pharmacies. The prices here do not account for what the cost may be with help from insurance (it may be cheaper with your insurance), but the website can give you the price if you did not use any insurance.  - You can print the associated coupon and take it with your prescription to the pharmacy.  - You may also stop by our office during regular business hours and pick up a GoodRx coupon card.  - If you need your prescription sent electronically to a different pharmacy, notify our office through [facility] MyChart or by phone at [phone] option 4.     Si Usted Necesita Algo Despus de Su Visita  Tambin puede enviarnos un mensaje a travs de MyChart. Por lo general respondemos a los mensajes de MyChart en el transcurso de 1 a 2 das hbiles.  Para renovar recetas, por favor pida a su farmacia que se ponga en contacto con nuestra oficina. Nuestro nmero de fax es el [phone].  Si tiene un asunto urgente cuando la clnica est cerrada y que no puede esperar hasta el siguiente da hbil, puede llamar/localizar a su doctor(a) al nmero que aparece a continuacin.   Por favor, tenga en cuenta que aunque hacemos todo lo posible para estar disponibles para asuntos urgentes fuera del horario de oficina, no estamos disponibles las 24 horas del da, los 7 das de la semana.   Si tiene un problema urgente y no puede comunicarse con nosotros, puede  optar por buscar atencin mdica  en el consultorio de su doctor(a), en una clnica privada, en un centro de atencin urgente o en una sala de emergencias.  Si tiene una emergencia mdica, por favor llame inmediatamente al 911 o vaya a la sala de emergencias.  Nmeros de bper  - Dr. Kowalski: [phone]  - Dra. Moye: [phone]  - Dra. Stewart: [phone]  En caso de inclemencias del tiempo, por favor llame a nuestra lnea principal al [phone] para una actualizacin sobre el estado de cualquier retraso o cierre.  Consejos para la medicacin en dermatologa: Por favor, guarde las cajas en las que vienen los medicamentos de uso tpico para ayudarle a seguir las instrucciones sobre dnde y cmo usarlos. Las farmacias generalmente imprimen las instrucciones del medicamento slo en las cajas y no directamente en los tubos del medicamento.   Si su medicamento es muy caro, por favor, pngase en contacto con   nuestra oficina llamando al [phone] y presione la opcin 4 o envenos un mensaje a travs de MyChart.   No podemos decirle cul ser su copago por los medicamentos por adelantado ya que esto es diferente dependiendo de la cobertura de su seguro. Sin embargo, es posible que podamos encontrar un medicamento sustituto a menor costo o llenar un formulario para que el seguro cubra el medicamento que se considera necesario.   Si se requiere una autorizacin previa para que su compaa de seguros cubra su medicamento, por favor permtanos de 1 a 2 das hbiles para completar este proceso.  Los precios de los medicamentos varan con frecuencia dependiendo del lugar de dnde se surte la receta y alguna farmacias pueden ofrecer precios ms baratos.  El sitio web www.goodrx.com tiene cupones para medicamentos de diferentes farmacias. Los precios aqu no tienen en cuenta lo que podra costar con la ayuda del seguro (puede ser ms barato con su seguro), pero el sitio web puede darle el  precio si no utiliz ningn seguro.  - Puede imprimir el cupn correspondiente y llevarlo con su receta a la farmacia.  - Tambin puede pasar por nuestra oficina durante el horario de atencin regular y recoger una tarjeta de cupones de GoodRx.  - Si necesita que su receta se enve electrnicamente a una farmacia diferente, informe a nuestra oficina a travs de MyChart de [facility] o por telfono llamando al [phone] y presione la opcin 4.  

## 2022-05-22 MED ORDER — OXYCODONE HCL 15 MG PO TABS: ORAL_TABLET | ORAL | 0 refills | Status: DC

## 2022-05-28 ENCOUNTER — Ambulatory Visit (INDEPENDENT_AMBULATORY_CARE_PROVIDER_SITE_OTHER): Payer: 59 | Admitting: Dermatology

## 2022-05-28 VITALS — BP 146/81 | HR 89

## 2022-05-28 DIAGNOSIS — D239 Other benign neoplasm of skin, unspecified: Secondary | ICD-10-CM

## 2022-05-28 DIAGNOSIS — Z4802 Encounter for removal of sutures: Secondary | ICD-10-CM

## 2022-05-28 DIAGNOSIS — D2362 Other benign neoplasm of skin of left upper limb, including shoulder: Secondary | ICD-10-CM

## 2022-05-28 NOTE — Patient Instructions (Addendum)
 Pre-Operative Instructions  You are scheduled for a surgical procedure at [facility]. We recommend you read the following instructions. If you have any questions or concerns, please call the office at [phone].  Shower and wash the entire body with soap and water the day of your surgery paying special attention to cleansing at and around the planned surgery site.  Avoid aspirin or aspirin containing products at least fourteen (14) days prior to your surgical procedure and for at least one week (7 Days) after your surgical procedure. If you take aspirin on a regular basis for heart disease or history of stroke or for any other reason, we may recommend you continue taking aspirin but please notify us if you take this on a regular basis. Aspirin can cause more bleeding to occur during surgery as well as prolonged bleeding and bruising after surgery.   Avoid other nonsteroidal pain medications at least one week prior to surgery and at least one week prior to your surgery. These include medications such as Ibuprofen (Motrin, Advil and Nuprin), Naprosyn, Voltaren, Relafen, etc. If medications are used for therapeutic reasons, please inform us as they can cause increased bleeding or prolonged bleeding during and bruising after surgical procedures.   Please advise us if you are taking any "blood thinner" medications such as Coumadin or Dipyridamole or Plavix or similar medications. These cause increased bleeding and prolonged bleeding during procedures and bruising after surgical procedures. We may have to consider discontinuing these medications briefly prior to and shortly after your surgery if safe to do so.   Please inform us of all medications you are currently taking. All medications that are taken regularly should be taken the day of surgery as you always do. Nevertheless, we need to be informed of what medications you are taking prior to surgery to know whether they will affect the  procedure or cause any complications.   Please inform us of any medication allergies. Also inform us of whether you have allergies to Latex or rubber products or whether you have had any adverse reaction to Lidocaine or Epinephrine.  Please inform us of any prosthetic or artificial body parts such as artificial heart valve, joint replacements, etc., or similar condition that might require preoperative antibiotics.   We recommend avoidance of alcohol at least two weeks prior to surgery and continued avoidance for at least two weeks after surgery.   We recommend discontinuation of tobacco smoking at least two weeks prior to surgery and continued abstinence for at least two weeks after surgery.  Do not plan strenuous exercise, strenuous work or strenuous lifting for approximately four weeks after your surgery.   We request if you are unable to make your scheduled surgical appointment, please call us at least a week in advance or as soon as you are aware of a problem so that we can cancel or reschedule the appointment.   You MAY TAKE TYLENOL (acetaminophen) for pain as it is not a blood thinner.   PLEASE PLAN TO BE IN TOWN FOR TWO WEEKS FOLLOWING SURGERY, THIS IS IMPORTANT SO YOU CAN BE CHECKED FOR DRESSING CHANGES, SUTURE REMOVAL AND TO MONITOR FOR POSSIBLE COMPLICATIONS.    Due to recent changes in healthcare laws, you may see results of your pathology and/or laboratory studies on MyChart before the doctors have had a chance to review them. We understand that in some cases there may be results that are confusing or concerning to you. Please understand that not all results are   received at the same time and often the doctors may need to interpret multiple results in order to provide you with the best plan of care or course of treatment. Therefore, we ask that you please give us 2 business days to thoroughly review all your results before contacting the office for clarification. Should we see a  critical lab result, you will be contacted sooner.   If You Need Anything After Your Visit  If you have any questions or concerns for your doctor, please call our main line at [phone] and press option 4 to reach your doctor's medical assistant. If no one answers, please leave a voicemail as directed and we will return your call as soon as possible. Messages left after 4 pm will be answered the following business day.   You may also send us a message via MyChart. We typically respond to MyChart messages within 1-2 business days.  For prescription refills, please ask your pharmacy to contact our office. Our fax number is [phone].  If you have an urgent issue when the clinic is closed that cannot wait until the next business day, you can page your doctor at the number below.    Please note that while we do our best to be available for urgent issues outside of office hours, we are not available 24/7.   If you have an urgent issue and are unable to reach us, you may choose to seek medical care at your doctor's office, retail clinic, urgent care center, or emergency room.  If you have a medical emergency, please immediately call 911 or go to the emergency department.  Pager Numbers  - Dr. Kowalski: [phone]  - Dr. Moye: [phone]  - Dr. Stewart: [phone]  In the event of inclement weather, please call our main line at [phone] for an update on the status of any delays or closures.  Dermatology Medication Tips: Please keep the boxes that topical medications come in in order to help keep track of the instructions about where and how to use these. Pharmacies typically print the medication instructions only on the boxes and not directly on the medication tubes.   If your medication is too expensive, please contact our office at [phone] option 4 or send us a message through MyChart.   We are unable to tell what your co-pay for medications will be in advance as  this is different depending on your insurance coverage. However, we may be able to find a substitute medication at lower cost or fill out paperwork to get insurance to cover a needed medication.   If a prior authorization is required to get your medication covered by your insurance company, please allow us 1-2 business days to complete this process.  Drug prices often vary depending on where the prescription is filled and some pharmacies may offer cheaper prices.  The website www.goodrx.com contains coupons for medications through different pharmacies. The prices here do not account for what the cost may be with help from insurance (it may be cheaper with your insurance), but the website can give you the price if you did not use any insurance.  - You can print the associated coupon and take it with your prescription to the pharmacy.  - You may also stop by our office during regular business hours and pick up a GoodRx coupon card.  - If you need your prescription sent electronically to a different pharmacy, notify our office through [facility] MyChart or by phone at [phone] option 4.       Si Usted Necesita Algo Despus de Su Visita  Tambin puede enviarnos un mensaje a travs de MyChart. Por lo general respondemos a los mensajes de MyChart en el transcurso de 1 a 2 das hbiles.  Para renovar recetas, por favor pida a su farmacia que se ponga en contacto con nuestra oficina. Nuestro nmero de fax es el [phone].  Si tiene un asunto urgente cuando la clnica est cerrada y que no puede esperar hasta el siguiente da hbil, puede llamar/localizar a su doctor(a) al nmero que aparece a continuacin.   Por favor, tenga en cuenta que aunque hacemos todo lo posible para estar disponibles para asuntos urgentes fuera del horario de oficina, no estamos disponibles las 24 horas del da, los 7 das de la semana.   Si tiene un problema urgente y no puede comunicarse con nosotros, puede optar por  buscar atencin mdica  en el consultorio de su doctor(a), en una clnica privada, en un centro de atencin urgente o en una sala de emergencias.  Si tiene una emergencia mdica, por favor llame inmediatamente al 911 o vaya a la sala de emergencias.  Nmeros de bper  - Dr. Kowalski: [phone]  - Dra. Moye: [phone]  - Dra. Stewart: [phone]  En caso de inclemencias del tiempo, por favor llame a nuestra lnea principal al [phone] para una actualizacin sobre el estado de cualquier retraso o cierre.  Consejos para la medicacin en dermatologa: Por favor, guarde las cajas en las que vienen los medicamentos de uso tpico para ayudarle a seguir las instrucciones sobre dnde y cmo usarlos. Las farmacias generalmente imprimen las instrucciones del medicamento slo en las cajas y no directamente en los tubos del medicamento.   Si su medicamento es muy caro, por favor, pngase en contacto con nuestra oficina llamando al [phone] y presione la opcin 4 o envenos un mensaje a travs de MyChart.   No podemos decirle cul ser su copago por los medicamentos por adelantado ya que esto es diferente dependiendo de la cobertura de su seguro. Sin embargo, es posible que podamos encontrar un medicamento sustituto a menor costo o llenar un formulario para que el seguro cubra el medicamento que se considera necesario.   Si se requiere una autorizacin previa para que su compaa de seguros cubra su medicamento, por favor permtanos de 1 a 2 das hbiles para completar este proceso.  Los precios de los medicamentos varan con frecuencia dependiendo del lugar de dnde se surte la receta y alguna farmacias pueden ofrecer precios ms baratos.  El sitio web www.goodrx.com tiene cupones para medicamentos de diferentes farmacias. Los precios aqu no tienen en cuenta lo que podra costar con la ayuda del seguro (puede ser ms barato con su seguro), pero el sitio web puede darle el precio si no  utiliz ningn seguro.  - Puede imprimir el cupn correspondiente y llevarlo con su receta a la farmacia.  - Tambin puede pasar por nuestra oficina durante el horario de atencin regular y recoger una tarjeta de cupones de GoodRx.  - Si necesita que su receta se enve electrnicamente a una farmacia diferente, informe a nuestra oficina a travs de MyChart de [facility] o por telfono llamando al [phone] y presione la opcin 4.  

## 2022-05-28 NOTE — Progress Notes (Signed)
   Follow-Up Visit   Subjective  Courtney Ross is a 66 y.o. female who presents for the following: Dermatofibroma bx proven (L wrist, pt presents for suture removal).  The following portions of the chart were reviewed this encounter and updated as appropriate:   Tobacco  Allergies  Meds  Problems  Med Hx  Surg Hx  Fam Hx     Review of Systems:  No other skin or systemic complaints except as noted in HPI or Assessment and Plan.  Objective  Well appearing patient in no apparent distress; mood and affect are within normal limits.  A focused examination was performed including left arm. Relevant physical exam findings are noted in the Assessment and Plan.  Left Wrist - Anterior Healing bx site   Assessment & Plan  Dermatofibroma Left Wrist - Anterior  Bx proven A dermatofibroma is a benign growth possibly related to trauma, such as an insect bite or inflamed acne-type bump.  Discussed removal (shave vrs excision) with resulting scar and risk of recurrence.  Since not bothersome, will observe for now.  Discussed excising and discussed scar from excision, pt prefers to schedule surgery.  Encounter for Removal of Sutures - Incision site at the L wrist is clean, dry and intact - Wound cleansed, sutures removed, wound cleansed and steri strips applied.  - Discussed pathology results showing dermatofibroma  - Patient advised to keep steri-strips dry until they fall off. - Scars remodel for a full year. - Once steri-strips fall off, patient can apply over-the-counter silicone scar cream each night to help with scar remodeling if desired. - Patient advised to call with any concerns or if they notice any new or changing lesions.    Return for to be scheduled for surgery dermatofibroma L wrist.  I, Othelia Pulling, RMA, am acting as scribe for Sarina Ser, MD . Documentation: I have reviewed the above documentation for accuracy and completeness, and I agree with the  above.  Sarina Ser, MD

## 2022-06-01 ENCOUNTER — Encounter: Payer: Self-pay | Admitting: Dermatology

## 2022-06-02 ENCOUNTER — Encounter: Payer: Self-pay | Admitting: Family Medicine

## 2022-06-02 ENCOUNTER — Ambulatory Visit: Payer: Self-pay

## 2022-06-02 ENCOUNTER — Ambulatory Visit (INDEPENDENT_AMBULATORY_CARE_PROVIDER_SITE_OTHER): Payer: 59 | Admitting: Family Medicine

## 2022-06-02 VITALS — BP 150/88 | HR 87 | Resp 16 | Wt 193.0 lb

## 2022-06-02 DIAGNOSIS — S60212A Contusion of left wrist, initial encounter: Secondary | ICD-10-CM

## 2022-06-02 NOTE — Progress Notes (Signed)
I,Courtney Ross,acting as a scribe for Lelon Huh, MD.,have documented all relevant documentation on the behalf of Lelon Huh, MD,as directed by  Lelon Huh, MD while in the presence of Lelon Huh, MD.   Established patient visit   Patient: Courtney Ross   DOB: Dec 26, 1955   66 y.o. Female  MRN: [id number] Visit Date: 06/02/2022  Today's healthcare provider: Lelon Huh, MD   Chief Complaint  Patient presents with   Wrist Injury   Subjective    HPI  Patient injured her left wrist one week ago. Patient states her dog jerked his leash while she was holding on. When the dog jerked the leash, it made patent's left wrist hit the door jam. Patient has some swelling on inner wrist.  Medications: Outpatient Medications Prior to Visit  Medication Sig   aspirin EC 81 MG tablet Take 81 mg by mouth at bedtime.   buPROPion (WELLBUTRIN SR) 150 MG 12 hr tablet TAKE 1 TABLET BY MOUTH 3 TIMES  DAILY   Cholecalciferol (VITAMIN D3) 5000 UNITS CAPS Take 5,000 Units by mouth daily.    Coenzyme Q10 200 MG capsule Take 200 mg by mouth daily.   cyclobenzaprine (FLEXERIL) 10 MG tablet Take 1 tablet (10 mg total) by mouth 3 (three) times daily as needed for muscle spasms.   diazepam (VALIUM) 10 MG tablet TAKE 1 TABLET BY MOUTH EVERY 6 HOURS AS NEEDED FOR ANXIETY.   diclofenac Sodium (VOLTAREN) 1 % GEL SMARTSIG:1 Gram(s) Topical 4 Times Daily PRN   estradiol (ESTRACE) 1 MG tablet TAKE 1 TABLET BY MOUTH DAILY   famotidine (PEPCID) 20 MG tablet Take 1 tablet (20 mg total) by mouth every evening.   fluticasone (FLONASE) 50 MCG/ACT nasal spray Place 1 spray into both nostrils daily as needed for allergies or rhinitis.   lidocaine (LIDODERM) 5 % Place 1 patch onto the skin daily. Remove & Discard patch within 12 hours or as directed by MD   LINZESS 145 MCG CAPS capsule TAKE 1 CAPSULE BY MOUTH  DAILY   meloxicam (MOBIC) 15 MG tablet TAKE 1 TABLET BY MOUTH AT  BEDTIME AS NEEDED FOR PAIN    Multiple Vitamin (MULTIVITAMIN WITH MINERALS) TABS tablet Take 1 tablet by mouth daily.   MYRBETRIQ 25 MG TB24 tablet TAKE 1 TABLET BY MOUTH  DAILY   omeprazole (PRILOSEC) 40 MG capsule Take 1 capsule (40 mg total) by mouth 2 (two) times daily before a meal.   oxyCODONE (ROXICODONE) 15 MG immediate release tablet TAKE ONE TO TWO TABLETS EVERY FOUR HOURSAS NEEDED FOR PAIN   polyethylene glycol (MIRALAX / GLYCOLAX) packet Take 17 g by mouth 2 (two) times daily as needed for mild constipation (dissolve in an 8 oz glass of water).   pramipexole (MIRAPEX) 0.25 MG tablet Take 2 tablets (0.5 mg total) by mouth at bedtime.   pregabalin (LYRICA) 150 MG capsule Take 150 mg by mouth 2 (two) times daily.   RESTASIS 0.05 % ophthalmic emulsion Place 1 drop into both eyes 2 (two) times daily.    rosuvastatin (CRESTOR) 10 MG tablet TAKE 1 TABLET BY MOUTH DAILY   sertraline (ZOLOFT) 50 MG tablet Take 1 tablet (50 mg total) by mouth daily.   hydroquinone 4 % cream Apply 1 application topically daily. (Patient not taking: Reported on 05/08/2022)   No facility-administered medications prior to visit.    Review of Systems  Constitutional:  Negative for appetite change, chills, fatigue and fever.  Respiratory:  Negative for chest  tightness and shortness of breath.   Cardiovascular:  Negative for chest pain and palpitations.  Gastrointestinal:  Negative for abdominal pain, nausea and vomiting.  Neurological:  Negative for dizziness and weakness.       Objective    BP (!) 150/88 (BP Location: Left Arm, Patient Position: Sitting, Cuff Size: Large)   Pulse 87   Resp 16   Wt 193 lb (87.5 kg)   SpO2 97%   BMI 29.35 kg/m    Physical Exam  FROM of wrist, normal strength. No tenderness. Moderate area of swelling with BB sized firm nodule over volar aspect of radial side of left wrist.    Assessment & Plan     1. Contusion of left wrist, initial encounter Has a small non-tender calcification along  lateral  flexor tendons, but no limits on wrist motion. Expect several months for calcification to resolve.         The entirety of the information documented in the History of Present Illness, Review of Systems and Physical Exam were personally obtained by me. Portions of this information were initially documented by the CMA and reviewed by me for thoroughness and accuracy.     Lelon Huh, MD  [facility] [phone] (phone) [phone] (fax)  [place]

## 2022-06-02 NOTE — Telephone Encounter (Signed)
Chief Complaint: Wrist injury - with bump Symptoms: Wrist looks odd Frequency: Last week Pertinent Negatives: Patient denies fever, true pain Disposition: '[]'$ ED /'[]'$ Urgent Care (no appt availability in office) / '[x]'$ Appointment(In office/virtual)/ '[]'$  [place] [facility]/ '[]'$ Home Care/ '[]'$ Refused Recommended Disposition /'[]'$ [place] [facility]/ '[]'$  Follow-up with PCP Additional Notes: PT hit her wrist on door jam while taking her dog out. PT hit her wrist pretty hard. There was bruising which has mostly resolved, However pt states that she has something, possibly a bone sticking up.     Patient called stating she hit her left hand on the door. Her wrist is swollen and it feels like a bone is sticking out. She would like an x-ray of her hand/wrist.   Reason for Disposition  [1] After 3 days AND [2] pain not improving  Answer Assessment - Initial Assessment Questions 1. ONSET: "When did the swelling start?" (e.g., minutes, hours, days, weeks)     Last week 2. LOCATION: "What part of the wrist is swollen?"  "Are both wrists swollen or just one wrist?"     One wrist - left 3. SEVERITY: "How bad is the swelling?"    * NONE: No joint swelling.   * SKIN ONLY: Localized; small area of puffy or swollen skin.   * BALL OR LUMP: There is a small firm ball or lump; size of a pea, marble, or grape.   * MILD: Joint looks or feels mildly swollen or puffy.   * MODERATE: Swollen; interferes with normal activities (e.g., work or school); decreased range of movement.   * SEVERE: Very swollen; can't move swollen joint at all; unable to hold a cup of water.     Swollen and bone sticking out 4. RECURRENT SYMPTOM: "Have you had wrist swelling before?" If Yes, ask: "When was the last time?" "What happened that time?"     no 5. CAUSE: "What do you think is causing the wrist swelling?" (e.g., arthritis, ganglion cyst, insect bite, recent injury)     Hurt hand on door frame 6. OTHER SYMPTOMS: "Do you have any  other symptoms?" (e.g., fever, hand pain)     Top of hand is sore. 7. PREGNANCY: "Is there any chance you are pregnant?" "When was your last menstrual period?"  Answer Assessment - Initial Assessment Questions 1. MECHANISM: "How did the injury happen?"     Hit hand on door jam 2. ONSET: "When did the injury happen?" (Minutes or hours ago)      Last week 3. APPEARANCE of INJURY: "What does the injury look like?"      bump 4. SEVERITY: "Can you use the hand normally?" "Can you bend your fingers into a ball and then fully open them?"     yes 5. SIZE: For cuts, bruises, or swelling, ask: "How large is it?" (e.g., inches or centimeters;  entire hand or wrist)      Bruise was size of 50 cent piece - 1.5 - 2 inches 6. PAIN: "Is there pain?" If Yes, ask: "How bad is the pain?"  (Scale 1-10; or mild, moderate, severe)     Tender on top 7. TETANUS: For any breaks in the skin, ask: "When was the last tetanus booster?"      8. OTHER SYMPTOMS: "Do you have any other symptoms?"      no 9. PREGNANCY: "Is there any chance you are pregnant?" "When was your last menstrual period?"     no  Protocols used: Wrist Swelling-A-AH, Hand and  Wrist Injury-A-AH

## 2022-06-08 ENCOUNTER — Encounter: Payer: Self-pay | Admitting: Dermatology

## 2022-06-12 ENCOUNTER — Other Ambulatory Visit: Payer: Self-pay | Admitting: Family Medicine

## 2022-06-12 ENCOUNTER — Ambulatory Visit: Payer: Self-pay

## 2022-06-12 DIAGNOSIS — F419 Anxiety disorder, unspecified: Secondary | ICD-10-CM

## 2022-06-12 NOTE — Progress Notes (Signed)
Established patient visit   Patient: Courtney Ross   DOB: July 24, 1955   66 y.o. Female  MRN: [id number] Visit Date: 06/13/2022  Today's healthcare provider: Gwyneth Sprout, FNP  Patient presents for new patient visit to establish care.  Introduced to Designer, jewellery role and practice setting.  All questions answered.  Discussed provider/patient relationship and expectations.   Chief Complaint  Patient presents with   Cough        Subjective    HPI HPI     Cough    Additional comments:        Last edited by Araceli Bouche, CMA on 06/13/2022  2:31 PM.      Cough: Patient complains of cough. Symptoms began 2 days ago. Cough described as non-productive. Patient denies dyspnea. Associated symptoms include headache, fever, chills, nasal congestion, sore throat, and nausea . Current treatments have included acetaminophen, with fair improvement.    Fever of 102.3 this morning  Pt has been exposed to Covid  Medications: Outpatient Medications Prior to Visit  Medication Sig   aspirin EC 81 MG tablet Take 81 mg by mouth at bedtime.   buPROPion (WELLBUTRIN SR) 150 MG 12 hr tablet TAKE 1 TABLET BY MOUTH 3 TIMES  DAILY   Cholecalciferol (VITAMIN D3) 5000 UNITS CAPS Take 5,000 Units by mouth daily.    Coenzyme Q10 200 MG capsule Take 200 mg by mouth daily.   diclofenac Sodium (VOLTAREN) 1 % GEL SMARTSIG:1 Gram(s) Topical 4 Times Daily PRN   estradiol (ESTRACE) 1 MG tablet TAKE 1 TABLET BY MOUTH DAILY   famotidine (PEPCID) 20 MG tablet Take 1 tablet (20 mg total) by mouth every evening.   fluticasone (FLONASE) 50 MCG/ACT nasal spray Place 1 spray into both nostrils daily as needed for allergies or rhinitis.   hydroquinone 4 % cream Apply 1 application topically daily.   lidocaine (LIDODERM) 5 % Place 1 patch onto the skin daily. Remove & Discard patch within 12 hours or as directed by MD   LINZESS 145 MCG CAPS capsule TAKE 1 CAPSULE BY MOUTH  DAILY   meloxicam (MOBIC)  15 MG tablet TAKE 1 TABLET BY MOUTH AT  BEDTIME AS NEEDED FOR PAIN   Multiple Vitamin (MULTIVITAMIN WITH MINERALS) TABS tablet Take 1 tablet by mouth daily.   MYRBETRIQ 25 MG TB24 tablet TAKE 1 TABLET BY MOUTH  DAILY   omeprazole (PRILOSEC) 40 MG capsule Take 1 capsule (40 mg total) by mouth 2 (two) times daily before a meal.   oxyCODONE (ROXICODONE) 15 MG immediate release tablet TAKE ONE TO TWO TABLETS EVERY FOUR HOURSAS NEEDED FOR PAIN   polyethylene glycol (MIRALAX / GLYCOLAX) packet Take 17 g by mouth 2 (two) times daily as needed for mild constipation (dissolve in an 8 oz glass of water).   pramipexole (MIRAPEX) 0.25 MG tablet Take 2 tablets (0.5 mg total) by mouth at bedtime.   pregabalin (LYRICA) 150 MG capsule Take 150 mg by mouth 2 (two) times daily.   RESTASIS 0.05 % ophthalmic emulsion Place 1 drop into both eyes 2 (two) times daily.    rosuvastatin (CRESTOR) 10 MG tablet TAKE 1 TABLET BY MOUTH DAILY   sertraline (ZOLOFT) 50 MG tablet Take 1 tablet (50 mg total) by mouth daily.   [DISCONTINUED] cyclobenzaprine (FLEXERIL) 10 MG tablet Take 1 tablet (10 mg total) by mouth 3 (three) times daily as needed for muscle spasms.   [DISCONTINUED] diazepam (VALIUM) 10 MG tablet TAKE 1 TABLET  BY MOUTH EVERY 6 HOURS AS NEEDED FOR ANXIETY.   No facility-administered medications prior to visit.    Review of Systems    Objective    BP 136/74 (BP Location: Right Arm, Patient Position: Sitting, Cuff Size: Normal)   Pulse 73   Temp (!) 97.4 F (36.3 C) (Oral)   Wt 184 lb 12.8 oz (83.8 kg)   SpO2 98%   BMI 28.11 kg/m   Physical Exam Vitals and nursing note reviewed.  Constitutional:      General: She is not in acute distress.    Appearance: Normal appearance. She is overweight. She is ill-appearing. She is not toxic-appearing or diaphoretic.  HENT:     Head: Normocephalic and atraumatic.     Right Ear: Tympanic membrane, ear canal and external ear normal.     Left Ear: Ear canal and  external ear normal. Swelling and tenderness present. Tympanic membrane is erythematous.     Nose: Congestion present. No rhinorrhea.     Right Sinus: Maxillary sinus tenderness present. No frontal sinus tenderness.     Left Sinus: Maxillary sinus tenderness present. No frontal sinus tenderness.     Mouth/Throat:     Mouth: Mucous membranes are moist.     Pharynx: Oropharynx is clear. No oropharyngeal exudate or posterior oropharyngeal erythema.  Eyes:     Extraocular Movements: Extraocular movements intact.     Conjunctiva/sclera: Conjunctivae normal.     Pupils: Pupils are equal, round, and reactive to light.  Cardiovascular:     Rate and Rhythm: Normal rate and regular rhythm.     Pulses: Normal pulses.     Heart sounds: Normal heart sounds. No murmur heard.    No friction rub. No gallop.  Pulmonary:     Effort: Pulmonary effort is normal. No respiratory distress.     Breath sounds: Normal breath sounds. No stridor. No wheezing, rhonchi or rales.  Chest:     Chest wall: No tenderness.  Musculoskeletal:        General: No swelling, tenderness, deformity or signs of injury. Normal range of motion.     Right lower leg: No edema.     Left lower leg: No edema.  Skin:    General: Skin is warm and dry.     Capillary Refill: Capillary refill takes less than 2 seconds.     Coloration: Skin is not jaundiced or pale.     Findings: No bruising, erythema, lesion or rash.  Neurological:     General: No focal deficit present.     Mental Status: She is alert and oriented to person, place, and time. Mental status is at baseline.     Cranial Nerves: No cranial nerve deficit.     Sensory: No sensory deficit.     Motor: No weakness.     Coordination: Coordination normal.  Psychiatric:        Mood and Affect: Mood normal.        Behavior: Behavior normal.        Thought Content: Thought content normal.        Judgment: Judgment normal.     No results found for any visits on 06/13/22.   Assessment & Plan     Problem List Items Addressed This Visit       Respiratory   Chest congestion    Acute, worsening S/S +2 days Known COVID exposure 6 days ago Associated s/s include HA, SOB/DOE, fever, chills, nasal congestion, sore throat, nausea, L ear pain  Little improvement with APAP        Other   Congestion of left ear - Primary    Popping reports with coughing or sneezing L AOM noted; will treat given current complaints, sinus tenderness and fever      Relevant Orders   POC COVID-19   POCT Influenza A/B   Fever    Acute, x1 >102F S/S +2 days Known COVID exposure 6 days ago Associated s/s include HA, SOB/DOE, fever, chills, nasal congestion, sore throat, nausea, L ear pain Little improvement with APAP      Return if symptoms worsen or fail to improve.     Vonna Kotyk, FNP, have reviewed all documentation for this visit. The documentation on 06/13/22 for the exam, diagnosis, procedures, and orders are all accurate and complete.  Gwyneth Sprout, [place] [phone] (phone) [phone] (fax)  [place]

## 2022-06-12 NOTE — Telephone Encounter (Signed)
Pt has congestion , headache, eye pain, and nausea / she also had chills and a possible fever / please advise   Chief Complaint: Sinus congestion, headache, sore throat, chills. Concerned she needs COVID and flu testing. Appointment made for tomorrow. Asking to be worked in today. Symptoms: Above Frequency: 2 days ago Pertinent Negatives: Patient denies SOB Disposition: '[]'$ ED /'[]'$ Urgent Care (no appt availability in office) / '[]'$ Appointment(In office/virtual)/ '[]'$  [facility]/ '[]'$ Home Care/ '[]'$ Refused Recommended Disposition /'[]'$ [place] [facility]/ '[x]'$  Follow-up with PCP Additional Notes: Please advise pt.  Reason for Disposition  [1] Sinus pain (not just congestion) AND [2] fever  Answer Assessment - Initial Assessment Questions 1. LOCATION: "Where does it hurt?"      Headache 2. ONSET: "When did the sinus pain start?"  (e.g., hours, days)      2 days ago 3. SEVERITY: "How bad is the pain?"   (Scale 1-10; mild, moderate or severe)   - MILD (1-3): doesn't interfere with normal activities    - MODERATE (4-7): interferes with normal activities (e.g., work or school) or awakens from sleep   - SEVERE (8-10): excruciating pain and patient unable to do any normal activities        Moderate 4. RECURRENT SYMPTOM: "Have you ever had sinus problems before?" If Yes, ask: "When was the last time?" and "What happened that time?"      Yes 5. NASAL CONGESTION: "Is the nose blocked?" If Yes, ask: "Can you open it or must you breathe through your mouth?"     Yes 6. NASAL DISCHARGE: "Do you have discharge from your nose?" If so ask, "What color?"     Clear 7. FEVER: "Do you have a fever?" If Yes, ask: "What is it, how was it measured, and when did it start?"      Chills 8. OTHER SYMPTOMS: "Do you have any other symptoms?" (e.g., sore throat, cough, earache, difficulty breathing)     Mild sore throat 9. PREGNANCY: "Is there any chance you are pregnant?" "When was your last menstrual period?"      No  Protocols used: Sinus Pain or Congestion-A-AH

## 2022-06-12 NOTE — Telephone Encounter (Signed)
Pt called saying she has lost her bottle of Zoloft., Sertraline 50 mg  She takes the generic.  She has ask that a prescription be sent to [facility].  CB@  [phone]

## 2022-06-12 NOTE — Telephone Encounter (Signed)
There are no appts today. Pt will to keep tomorrows appt or see UC.

## 2022-06-13 ENCOUNTER — Encounter: Payer: Self-pay | Admitting: Family Medicine

## 2022-06-13 ENCOUNTER — Ambulatory Visit (INDEPENDENT_AMBULATORY_CARE_PROVIDER_SITE_OTHER): Payer: 59 | Admitting: Family Medicine

## 2022-06-13 VITALS — BP 136/74 | HR 73 | Temp 97.4°F | Wt 184.8 lb

## 2022-06-13 DIAGNOSIS — R0989 Other specified symptoms and signs involving the circulatory and respiratory systems: Secondary | ICD-10-CM | POA: Diagnosis not present

## 2022-06-13 DIAGNOSIS — H938X2 Other specified disorders of left ear: Secondary | ICD-10-CM | POA: Diagnosis not present

## 2022-06-13 DIAGNOSIS — N421 Congestion and hemorrhage of prostate: Secondary | ICD-10-CM | POA: Insufficient documentation

## 2022-06-13 DIAGNOSIS — R509 Fever, unspecified: Secondary | ICD-10-CM | POA: Insufficient documentation

## 2022-06-13 LAB — POC COVID19 BINAXNOW: SARS Coronavirus 2 Ag: NEGATIVE

## 2022-06-13 LAB — POCT INFLUENZA A/B
Influenza A, POC: NEGATIVE
Influenza B, POC: NEGATIVE

## 2022-06-13 MED ORDER — AMOXICILLIN-POT CLAVULANATE 875-125 MG PO TABS: 1.0000 | ORAL_TABLET | Freq: Two times a day (BID) | ORAL | 0 refills | Status: DC

## 2022-06-13 MED ORDER — CYCLOBENZAPRINE HCL 10 MG PO TABS: 10.0000 mg | ORAL_TABLET | Freq: Three times a day (TID) | ORAL | 0 refills | Status: DC | PRN

## 2022-06-13 MED ORDER — DIAZEPAM 10 MG PO TABS: ORAL_TABLET | ORAL | 0 refills | Status: DC

## 2022-06-13 MED ORDER — SERTRALINE HCL 50 MG PO TABS: 50.0000 mg | ORAL_TABLET | Freq: Every day | ORAL | 0 refills | Status: DC

## 2022-06-13 NOTE — Assessment & Plan Note (Signed)
Popping reports with coughing or sneezing L AOM noted; will treat given current complaints, sinus tenderness and fever

## 2022-06-13 NOTE — Assessment & Plan Note (Signed)
Acute, x1 >102F S/S +2 days Known COVID exposure 6 days ago Associated s/s include HA, SOB/DOE, fever, chills, nasal congestion, sore throat, nausea, L ear pain Little improvement with APAP

## 2022-06-13 NOTE — Telephone Encounter (Signed)
Requested Prescriptions  Pending Prescriptions Disp Refills   sertraline (ZOLOFT) 50 MG tablet 90 tablet 0    Sig: Take 1 tablet (50 mg total) by mouth daily.     Psychiatry:  Antidepressants - SSRI - sertraline Failed - 06/12/2022 12:27 PM      Failed - ALT in normal range and within 360 days    ALT  Date Value Ref Range Status  08/27/2021 34 (H) 0 - 32 IU/L Final         Passed - AST in normal range and within 360 days    AST  Date Value Ref Range Status  08/27/2021 31 0 - 40 IU/L Final         Passed - Completed PHQ-2 or PHQ-9 in the last 360 days      Passed - Valid encounter within last 6 months    Recent Outpatient Visits           1 week ago Contusion of left wrist, initial encounter   [facility] Birdie Sons, MD   1 month ago Hoarseness, persistent   [facility] Birdie Sons, MD   9 months ago Chronic lumbar radicular pain (Right side)   [facility] Birdie Sons, MD   1 year ago Restless legs syndrome   [facility] Birdie Sons, MD   1 year ago Essential hypertension   [facility] Birdie Sons, MD       Future Appointments             In 2 months Vanga, Tally Due, MD [place]

## 2022-06-13 NOTE — Assessment & Plan Note (Signed)
Acute, worsening S/S +2 days Known COVID exposure 6 days ago Associated s/s include HA, SOB/DOE, fever, chills, nasal congestion, sore throat, nausea, L ear pain Little improvement with APAP

## 2022-06-20 ENCOUNTER — Other Ambulatory Visit: Payer: Self-pay | Admitting: Family Medicine

## 2022-06-20 NOTE — Telephone Encounter (Signed)
Requested medication (s) are due for refill today: routing for approval  Requested medication (s) are on the active medication list: yes Last refill:  05/22/22  Future visit scheduled: yes  Notes to clinic:  Unable to refill per protocol, cannot delegate.      Requested Prescriptions  Pending Prescriptions Disp Refills   oxyCODONE (ROXICODONE) 15 MG immediate release tablet 180 tablet 0    Sig: TAKE ONE TO TWO TABLETS EVERY FOUR HOURSAS NEEDED FOR PAIN     Not Delegated - Analgesics:  Opioid Agonists Failed - 06/20/2022 12:25 PM      Failed - This refill cannot be delegated      Failed - Urine Drug Screen completed in last 360 days      Passed - Valid encounter within last 3 months    Recent Outpatient Visits           1 week ago Congestion of left ear   [facility] Gwyneth Sprout, FNP   2 weeks ago Contusion of left wrist, initial encounter   [facility] Birdie Sons, MD   1 month ago Hoarseness, persistent   [facility] Birdie Sons, MD   9 months ago Chronic lumbar radicular pain (Right side)   [facility] Birdie Sons, MD   1 year ago Restless legs syndrome   [facility] Birdie Sons, MD       Future Appointments             In 2 months Vanga, Tally Due, MD [place]

## 2022-06-20 NOTE — Telephone Encounter (Signed)
Medication Refill - Medication: oxyCODONE (ROXICODONE) 15 MG immediate release tablet   Has the patient contacted their pharmacy? No. (Agent: If no, request that the patient contact the pharmacy for the refill. If patient does not wish to contact the pharmacy document the reason why and proceed with request.) (Agent: If yes, when and what did the pharmacy advise?)  Preferred Pharmacy (with phone number or street name): [place], [place] - [address], [place] [address] Phone: [phone]  Fax: [phone]  Has the patient been seen for an appointment in the last year OR does the patient have an upcoming appointment? Yes.    Agent: Please be advised that RX refills may take up to 3 business days. We ask that you follow-up with your pharmacy.

## 2022-06-22 MED ORDER — OXYCODONE HCL 15 MG PO TABS: ORAL_TABLET | ORAL | 0 refills | Status: DC

## 2022-07-01 ENCOUNTER — Encounter: Payer: 59 | Admitting: Dermatology

## 2022-07-11 ENCOUNTER — Other Ambulatory Visit: Payer: Self-pay | Admitting: Family Medicine

## 2022-07-14 NOTE — Telephone Encounter (Signed)
Requested Prescriptions  Pending Prescriptions Disp Refills   estradiol (ESTRACE) 1 MG tablet [Pharmacy Med Name: Estradiol 1 MG Oral Tablet] 90 tablet 3    Sig: TAKE 1 TABLET BY MOUTH DAILY     OB/GYN:  Estrogens Passed - 07/11/2022 10:12 PM      Passed - Mammogram is up-to-date per Health Maintenance      Passed - Last BP in normal range    BP Readings from Last 1 Encounters:  06/13/22 136/74         Passed - Valid encounter within last 12 months    Recent Outpatient Visits           1 month ago Congestion of left ear   [facility] Gwyneth Sprout, FNP   1 month ago Contusion of left wrist, initial encounter   [facility] Birdie Sons, MD   2 months ago Hoarseness, persistent   [place], Donald E, MD   10 months ago Chronic lumbar radicular pain (Right side)   [facility] Birdie Sons, MD   1 year ago Restless legs syndrome   [place], Donald E, MD       Future Appointments             In 1 month Vanga, Tally Due, MD [facility]

## 2022-07-16 ENCOUNTER — Ambulatory Visit (INDEPENDENT_AMBULATORY_CARE_PROVIDER_SITE_OTHER): Payer: 59

## 2022-07-16 VITALS — Ht 68.0 in | Wt 182.0 lb

## 2022-07-16 DIAGNOSIS — Z0000 Encounter for general adult medical examination without abnormal findings: Secondary | ICD-10-CM

## 2022-07-16 NOTE — Patient Instructions (Signed)
[name] , Thank you for taking time to come for your Medicare Wellness Visit. I appreciate your ongoing commitment to your health goals. Please review the following plan we discussed and let me know if I can assist you in the future.   These are the goals we discussed:  Goals      DIET - EAT MORE FRUITS AND VEGETABLES     Increase water intake     Starting 05/30/16, I will continue drinking 5 glasses of water a day.         This is a list of the screening recommended for you and due dates:  Health Maintenance  Topic Date Due   COVID-19 Vaccine (1) Never done   Zoster (Shingles) Vaccine (2 of 2) 11/05/2020   Flu Shot  09/21/2022*   Colon Cancer Screening  01/10/2023   Medicare Annual Wellness Visit  07/17/2023   Mammogram  09/25/2023   DEXA scan (bone density measurement)  01/03/2024   DTaP/Tdap/Td vaccine (3 - Td or Tdap) 09/04/2026   Pneumonia Vaccine  Completed   Hepatitis C Screening: USPSTF Recommendation to screen - Ages 2-79 yo.  Completed   HPV Vaccine  Aged Out  *Topic was postponed. The date shown is not the original due date.    Advanced directives: yes  Conditions/risks identified: none  Next appointment: Follow up in one year for your annual wellness visit 07/22/2023 '@1100'$  via telephone   Preventive Care 65 Years and Older, Female Preventive care refers to lifestyle choices and visits with your health care provider that can promote health and wellness. What does preventive care include? A yearly physical exam. This is also called an annual well check. Dental exams once or twice a year. Routine eye exams. Ask your health care provider how often you should have your eyes checked. Personal lifestyle choices, including: Daily care of your teeth and gums. Regular physical activity. Eating a healthy diet. Avoiding tobacco and drug use. Limiting alcohol use. Practicing safe sex. Taking low-dose aspirin every day. Taking vitamin and mineral supplements as  recommended by your health care provider. What happens during an annual well check? The services and screenings done by your health care provider during your annual well check will depend on your age, overall health, lifestyle risk factors, and family history of disease. Counseling  Your health care provider may ask you questions about your: Alcohol use. Tobacco use. Drug use. Emotional well-being. Home and relationship well-being. Sexual activity. Eating habits. History of falls. Memory and ability to understand (cognition). Work and work Statistician. Reproductive health. Screening  You may have the following tests or measurements: Height, weight, and BMI. Blood pressure. Lipid and cholesterol levels. These may be checked every 5 years, or more frequently if you are over 55 years old. Skin check. Lung cancer screening. You may have this screening every year starting at age 38 if you have a 30-pack-year history of smoking and currently smoke or have quit within the past 15 years. Fecal occult blood test (FOBT) of the stool. You may have this test every year starting at age 55. Flexible sigmoidoscopy or colonoscopy. You may have a sigmoidoscopy every 5 years or a colonoscopy every 10 years starting at age 71. Hepatitis C blood test. Hepatitis B blood test. Sexually transmitted disease (STD) testing. Diabetes screening. This is done by checking your blood sugar (glucose) after you have not eaten for a while (fasting). You may have this done every 1-3 years. Bone density scan. This is done  to screen for osteoporosis. You may have this done starting at age 28. Mammogram. This may be done every 1-2 years. Talk to your health care provider about how often you should have regular mammograms. Talk with your health care provider about your test results, treatment options, and if necessary, the need for more tests. Vaccines  Your health care provider may recommend certain vaccines, such  as: Influenza vaccine. This is recommended every year. Tetanus, diphtheria, and acellular pertussis (Tdap, Td) vaccine. You may need a Td booster every 10 years. Zoster vaccine. You may need this after age 26. Pneumococcal 13-valent conjugate (PCV13) vaccine. One dose is recommended after age 46. Pneumococcal polysaccharide (PPSV23) vaccine. One dose is recommended after age 23. Talk to your health care provider about which screenings and vaccines you need and how often you need them. This information is not intended to replace advice given to you by your health care provider. Make sure you discuss any questions you have with your health care provider. Document Released: 07/06/2015 Document Revised: 02/27/2016 Document Reviewed: 04/10/2015 Elsevier Interactive Patient Education  2017 [employer] Prevention in the Home Falls can cause injuries. They can happen to people of all ages. There are many things you can do to make your home safe and to help prevent falls. What can I do on the outside of my home? Regularly fix the edges of walkways and driveways and fix any cracks. Remove anything that might make you trip as you walk through a door, such as a raised step or threshold. Trim any bushes or trees on the path to your home. Use bright outdoor lighting. Clear any walking paths of anything that might make someone trip, such as rocks or tools. Regularly check to see if handrails are loose or broken. Make sure that both sides of any steps have handrails. Any raised decks and porches should have guardrails on the edges. Have any leaves, snow, or ice cleared regularly. Use sand or salt on walking paths during winter. Clean up any spills in your garage right away. This includes oil or grease spills. What can I do in the bathroom? Use night lights. Install grab bars by the toilet and in the tub and shower. Do not use towel bars as grab bars. Use non-skid mats or decals in the tub or  shower. If you need to sit down in the shower, use a plastic, non-slip stool. Keep the floor dry. Clean up any water that spills on the floor as soon as it happens. Remove soap buildup in the tub or shower regularly. Attach bath mats securely with double-sided non-slip rug tape. Do not have throw rugs and other things on the floor that can make you trip. What can I do in the bedroom? Use night lights. Make sure that you have a light by your bed that is easy to reach. Do not use any sheets or blankets that are too big for your bed. They should not hang down onto the floor. Have a firm chair that has side arms. You can use this for support while you get dressed. Do not have throw rugs and other things on the floor that can make you trip. What can I do in the kitchen? Clean up any spills right away. Avoid walking on wet floors. Keep items that you use a lot in easy-to-reach places. If you need to reach something above you, use a strong step stool that has a grab bar. Keep electrical cords out of the  way. Do not use floor polish or wax that makes floors slippery. If you must use wax, use non-skid floor wax. Do not have throw rugs and other things on the floor that can make you trip. What can I do with my stairs? Do not leave any items on the stairs. Make sure that there are handrails on both sides of the stairs and use them. Fix handrails that are broken or loose. Make sure that handrails are as long as the stairways. Check any carpeting to make sure that it is firmly attached to the stairs. Fix any carpet that is loose or worn. Avoid having throw rugs at the top or bottom of the stairs. If you do have throw rugs, attach them to the floor with carpet tape. Make sure that you have a light switch at the top of the stairs and the bottom of the stairs. If you do not have them, ask someone to add them for you. What else can I do to help prevent falls? Wear shoes that: Do not have high heels. Have  rubber bottoms. Are comfortable and fit you well. Are closed at the toe. Do not wear sandals. If you use a stepladder: Make sure that it is fully opened. Do not climb a closed stepladder. Make sure that both sides of the stepladder are locked into place. Ask someone to hold it for you, if possible. Clearly mark and make sure that you can see: Any grab bars or handrails. First and last steps. Where the edge of each step is. Use tools that help you move around (mobility aids) if they are needed. These include: Canes. Walkers. Scooters. Crutches. Turn on the lights when you go into a dark area. Replace any light bulbs as soon as they burn out. Set up your furniture so you have a clear path. Avoid moving your furniture around. If any of your floors are uneven, fix them. If there are any pets around you, be aware of where they are. Review your medicines with your doctor. Some medicines can make you feel dizzy. This can increase your chance of falling. Ask your doctor what other things that you can do to help prevent falls. This information is not intended to replace advice given to you by your health care provider. Make sure you discuss any questions you have with your health care provider. Document Released: 04/05/2009 Document Revised: 11/15/2015 Document Reviewed: 07/14/2014 Elsevier Interactive Patient Education  2017 [employer].

## 2022-07-16 NOTE — Progress Notes (Signed)
Virtual Visit via Telephone Note  I connected with  Courtney Ross on 07/16/22 at 11:00 AM EST by telephone and verified that I am speaking with the correct person using two identifiers.  Location: Patient: home Provider: [facility] Persons participating in the virtual visit: patient/Nurse Health Advisor   I discussed the limitations, risks, security and privacy concerns of performing an evaluation and management service by telephone and the availability of in person appointments. The patient expressed understanding and agreed to proceed.  Interactive audio and video telecommunications were attempted between this nurse and patient, however failed, due to patient having technical difficulties OR patient did not have access to video capability.  We continued and completed visit with audio only.  Some vital signs may be absent or patient reported.   Roger Shelter, LPN  Subjective:   Courtney Ross is a 67 y.o. female who presents for Medicare Annual (Subsequent) preventive examination.  Review of Systems        Objective:    Today's Vitals   07/16/22 1048 07/16/22 1049  Weight: 182 lb (82.6 kg)   Height: '5\' 8"'$  (1.727 m)   PainSc:  6    Body mass index is 27.67 kg/m.     07/16/2022   11:14 AM 05/08/2022   12:39 PM 07/15/2021   11:15 AM 03/04/2021   10:20 AM 02/11/2021    8:52 AM 07/09/2020   11:09 AM 01/10/2020    8:34 AM  Advanced Directives  Does Patient Have a Medical Advance Directive? Yes Yes Yes Yes Yes Yes Yes  Type of Paramedic of [place];Living will Healthcare Power of [place];Living will [place];Living will [place];Living will   Does patient want to make changes to medical advance directive? Yes (MAU/Ambulatory/Procedural Areas - Information given)  Yes (Inpatient - patient defers changing a medical advance directive and declines information  at this time)  No - Patient declined    Copy of [place] in Chart? Yes - validated most recent copy scanned in chart (See row information) No - copy requested Yes - validated most recent copy scanned in chart (See row information)  Yes - validated most recent copy scanned in chart (See row information) Yes - validated most recent copy scanned in chart (See row information)     Current Medications (verified) Outpatient Encounter Medications as of 07/16/2022  Medication Sig   aspirin EC 81 MG tablet Take 81 mg by mouth at bedtime.   buPROPion (WELLBUTRIN SR) 150 MG 12 hr tablet TAKE 1 TABLET BY MOUTH 3 TIMES  DAILY   Cholecalciferol (VITAMIN D3) 5000 UNITS CAPS Take 5,000 Units by mouth daily.    Coenzyme Q10 200 MG capsule Take 200 mg by mouth daily.   cyclobenzaprine (FLEXERIL) 10 MG tablet Take 1 tablet (10 mg total) by mouth 3 (three) times daily as needed for muscle spasms.   diazepam (VALIUM) 10 MG tablet TAKE 1 TABLET BY MOUTH EVERY 6 HOURS AS NEEDED FOR ANXIETY.   diclofenac Sodium (VOLTAREN) 1 % GEL SMARTSIG:1 Gram(s) Topical 4 Times Daily PRN   estradiol (ESTRACE) 1 MG tablet TAKE 1 TABLET BY MOUTH DAILY   fluticasone (FLONASE) 50 MCG/ACT nasal spray Place 1 spray into both nostrils daily as needed for allergies or rhinitis.   lidocaine (LIDODERM) 5 % Place 1 patch onto the skin daily. Remove & Discard patch within 12 hours or as directed by MD  LINZESS 145 MCG CAPS capsule TAKE 1 CAPSULE BY MOUTH  DAILY   meloxicam (MOBIC) 15 MG tablet TAKE 1 TABLET BY MOUTH AT  BEDTIME AS NEEDED FOR PAIN   Multiple Vitamin (MULTIVITAMIN WITH MINERALS) TABS tablet Take 1 tablet by mouth daily.   MYRBETRIQ 25 MG TB24 tablet TAKE 1 TABLET BY MOUTH  DAILY   omeprazole (PRILOSEC) 40 MG capsule Take 1 capsule (40 mg total) by mouth 2 (two) times daily before a meal.   oxyCODONE (ROXICODONE) 15 MG immediate release tablet TAKE ONE TO TWO TABLETS EVERY FOUR HOURSAS NEEDED FOR PAIN    pramipexole (MIRAPEX) 0.25 MG tablet Take 2 tablets (0.5 mg total) by mouth at bedtime.   pregabalin (LYRICA) 150 MG capsule Take 150 mg by mouth 2 (two) times daily.   RESTASIS 0.05 % ophthalmic emulsion Place 1 drop into both eyes 2 (two) times daily.    sertraline (ZOLOFT) 50 MG tablet Take 1 tablet (50 mg total) by mouth daily.   amoxicillin-clavulanate (AUGMENTIN) 875-125 MG tablet Take 1 tablet by mouth 2 (two) times daily. (Patient not taking: Reported on 07/16/2022)   famotidine (PEPCID) 20 MG tablet Take 1 tablet (20 mg total) by mouth every evening. (Patient not taking: Reported on 07/16/2022)   hydroquinone 4 % cream Apply 1 application topically daily. (Patient not taking: Reported on 07/16/2022)   polyethylene glycol (MIRALAX / GLYCOLAX) packet Take 17 g by mouth 2 (two) times daily as needed for mild constipation (dissolve in an 8 oz glass of water). (Patient not taking: Reported on 07/16/2022)   rosuvastatin (CRESTOR) 10 MG tablet TAKE 1 TABLET BY MOUTH DAILY   No facility-administered encounter medications on file as of 07/16/2022.    Allergies (verified) Hydrocodone, Lisinopril, Mirtazapine, and Other   History: Past Medical History:  Diagnosis Date   Anxiety    Atypical squamous cells of undetermined significance on cytologic smear of vagina (ASC-US) 11/22/2014   Back pain    Colon polyp    Constipation    Degenerative disc disease, lumbar    Depression    Family history of adverse reaction to anesthesia    daughter nausea   Fatty liver    Fatty liver disease, nonalcoholic    GERD (gastroesophageal reflux disease)    H/O spinal fusion    Headache    migraines   Headache, migraine 05/08/2015   Hepatitis    in the 1970s.  Resolved.   High cholesterol    History of frequent ear infections in childhood 11/22/2014   Overview:   DID have Chicken Pox. DID have Measles. DID have Mumps. DID have Scarlet Fever.      Formatting of this note might be different from the  original.  DID have Chicken Pox. DID have Measles. DID have Mumps. DID have Scarlet Fever.   Hypertension    Lower back pain    Polycystic ovarian disease    PONV (postoperative nausea and vomiting)    due to surgery from the 1990's   Restless leg syndrome    Spasm 01/29/2009   Spinal cord stimulator status    Wears dentures    full upper, partial lower   Past Surgical History:  Procedure Laterality Date   APPENDECTOMY  1988   ARTHRODESIS METATARSALPHALANGEAL JOINT (MTPJ) Left 12/14/2014   Procedure: ARTHRODESIS METATARSALPHALANGEAL JOINT (MTPJ) 1ST;  Surgeon: Albertine Patricia, DPM;  Location: [place];  Service: Podiatry;  Laterality: Left;  LMA   back fusion  2004   lumbar region.  had broken facet joint.    BACK SURGERY  [phone]   x3   BLADDER SUSPENSION  2006   BREAST BIOPSY Left 1990's   core Sankar   BREAST SURGERY  2005   lumpectomy   CARPAL TUNNEL RELEASE Bilateral 02/26/07,05/21/07   CATARACT EXTRACTION W/PHACO Right 02/11/2021   Procedure: CATARACT EXTRACTION PHACO AND INTRAOCULAR LENS PLACEMENT ([place]) RIGHT PANOPTIX LENS;  Surgeon: Eulogio Bear, MD;  Location: [facility];  Service: Ophthalmology;  Laterality: Right;  PANOPTIX LENS 1.92 00:17.8   CATARACT EXTRACTION W/PHACO Left 03/04/2021   Procedure: CATARACT EXTRACTION PHACO AND INTRAOCULAR  LENS PLACEMENT ([facility]) LEFT PANOPTIX LENS 2.87 00:30.0;  Surgeon: Eulogio Bear, MD;  Location: [facility];  Service: Ophthalmology;  Laterality: Left;  PANOPTIX LENS   CHOLECYSTECTOMY N/A 05/03/2018   Procedure: LAPAROSCOPIC CHOLECYSTECTOMY WITH INTRAOPERATIVE CHOLANGIOGRAM;  Surgeon: Robert Bellow, MD;  Location: [facility];  Service: General;  Laterality: N/A;   COLONOSCOPY     COLONOSCOPY WITH PROPOFOL N/A 01/10/2020   Procedure: COLONOSCOPY WITH PROPOFOL;  Surgeon: Jonathon Bellows, MD;  Location: [facility];  Service: Gastroenterology;  Laterality: N/A;   COLPORRHAPHY     DILATION AND  CURETTAGE OF UTERUS     ECTOPIC PREGNANCY SURGERY  1982   ESOPHAGOGASTRODUODENOSCOPY (EGD) WITH PROPOFOL N/A 05/08/2022   Procedure: ESOPHAGOGASTRODUODENOSCOPY (EGD) WITH BIOPSY;  Surgeon: Lin Landsman, MD;  Location: [facility];  Service: Endoscopy;  Laterality: N/A;   FOOT SURGERY Left 2017   fusion on big toe, hammer toe, bunions   HAMMER TOE SURGERY Left 03/22/2015   Procedure: HAMMER TOE CORRECTION L 2ND AND 3RD ;  Surgeon: Albertine Patricia, DPM;  Location: [place];  Service: Podiatry;  Laterality: Left;  WITH LOCAL   HERNIA REPAIR Right 1988   inguinal   LAMINECTOMY  1993, 1994   NECK SURGERY  2006   fusion   RECTOCELE REPAIR N/A 08/25/2016   Procedure: POSTERIOR REPAIR (RECTOCELE);  Surgeon: Brayton Mars, MD;  Location: [facility];  Service: Gynecology;  Laterality: N/A;   SPINAL CORD STIMULATOR BATTERY EXCHANGE Right 04/08/2019   Procedure: SPINAL CORD STIMULATOR BATTERY EXCHANGE;  Surgeon: Clydell Hakim, MD;  Location: [place];  Service: Neurosurgery;  Laterality: Right;  SPINAL CORD STIMULATOR BATTERY EXCHANGE   SPINAL CORD STIMULATOR IMPLANT  2014   stimulator in the ileo inguinal nerve due to scar tissue in abdomen   TONSILLECTOMY  1985   [place]   VENTRAL HERNIA REPAIR N/A 09/01/2018   6.4 cm Ventra lite mesh. HERNIA REPAIR VENTRAL ADULT;  Surgeon: Robert Bellow, MD;  Location: [facility];  Service: General;  Laterality: N/A;   Family History  Problem Relation Age of Onset   Diabetes Father    Hypertension Father    CAD Father    Diabetes Sister        non-Insulin dependent diabetes mellitus   Heart disease Brother    Cancer Daughter        cancer of the cervix at age 41-18   Heart disease Brother    Breast cancer Paternal Aunt    Colon cancer Paternal Uncle    Ovarian cancer Neg Hx    Social History   Socioeconomic History   Marital status: Legally Separated    Spouse name: Not on file   Number of  children: 4   Years of education: Not on file   Highest education level: 9th grade  Occupational  History    Comment: disabled  Tobacco Use   Smoking status: Never   Smokeless tobacco: Never  Vaping Use   Vaping Use: Never used  Substance and Sexual Activity   Alcohol use: Not Currently    Comment: rare   Drug use: No   Sexual activity: Not Currently    Birth control/protection: Surgical  Other Topics Concern   Not on file  Social History Narrative   Pt is currently receiving assistance from [employer] and gets food stamps.    Social Determinants of Health   Financial Resource Strain: Medium Risk (07/16/2022)   Overall Financial Resource Strain (CARDIA)    Difficulty of Paying Living Expenses: Somewhat hard  Food Insecurity: No Food Insecurity (07/16/2022)   Hunger Vital Sign    Worried About Running Out of Food in the Last Year: Never true    Ran Out of Food in the Last Year: Never true  Transportation Needs: No Transportation Needs (07/16/2022)   PRAPARE - Hydrologist (Medical): No    Lack of Transportation (Non-Medical): No  Physical Activity: Inactive (07/16/2022)   Exercise Vital Sign    Days of Exercise per Week: 0 days    Minutes of Exercise per Session: 0 min  Stress: No Stress Concern Present (07/16/2022)   [place]    Feeling of Stress : Only a little  Social Connections: Moderately Integrated (07/16/2022)   Social Connection and Isolation Panel [NHANES]    Frequency of Communication with Friends and Family: Three times a week    Frequency of Social Gatherings with Friends and Family: Three times a week    Attends Religious Services: More than 4 times per year    Active Member of Clubs or Organizations: Yes    Attends Music therapist: More than 4 times per year    Marital Status: Separated    Tobacco Counseling not needed  Clinical Intake:  Pre-visit  preparation completed: Yes  Pain : 0-10 Pain Score: 6  Pain Type: Neuropathic pain, Chronic pain Pain Location: Back Pain Orientation: Lower Pain Descriptors / Indicators: Aching (neck) Pain Onset: More than a month ago Pain Frequency: Intermittent Pain Relieving Factors: heat,massage,pain patches, pain meds Effect of Pain on Daily Activities: limited in amount of activity pt can do  Pain Relieving Factors: heat,massage,pain patches, pain meds  BMI - recorded: 27.67 Nutritional Status: BMI 25 -29 Overweight Nutritional Risks: None Diabetes: No  How often do you need to have someone help you when you read instructions, pamphlets, or other written materials from your doctor or pharmacy?: 1 - Never  Diabetic?no  Interpreter Needed?: No  Information entered by :: B.Shondrika Hoque,LPN   Activities of Daily Living    07/16/2022   11:14 AM 05/08/2022   12:39 PM  In your present state of health, do you have any difficulty performing the following activities:  Hearing? 0 0  Vision? 0 0  Difficulty concentrating or making decisions? 0 0  Walking or climbing stairs? 0 0  Dressing or bathing? 0 0  Doing errands, shopping? 0   Preparing Food and eating ? N   Using the Toilet? N   In the past six months, have you accidently leaked urine? N   Do you have problems with loss of bowel control? N   Managing your Medications? N   Managing your Finances? N   Housekeeping or managing your Housekeeping? N     Patient  Care Team: Birdie Sons, MD as PCP - General (Family Medicine) Leandrew Koyanagi, MD as Referring Physician (Ophthalmology) Arnell Asal, MD as Referring Physician (Neurosurgery) Bary Castilla Forest Gleason, MD (General Surgery) Jonathon Bellows, MD as Consulting Physician (Gastroenterology) Caroline More, DPM as Consulting Physician (Podiatry) Rubie Maid, MD as Referring Physician (Obstetrics and Gynecology)  Indicate any recent Medical Services you may have received  from other than [facility] providers in the past year (date may be approximate).     Assessment:   This is a routine wellness examination for Courtney Ross.  Hearing/Vision screen Hearing Screening - Comments:: No problems Vision Screening - Comments:: Cataracts removed:waiting for her glasses to come-vision adequate. [facility]  Dietary issues and exercise activities discussed: Current Exercise Habits: The patient does not participate in regular exercise at present, Exercise limited by: Other - see comments (pain management)   Goals Addressed   None    Depression Screen    07/16/2022   11:09 AM 05/05/2022    1:57 PM 08/26/2021    3:55 PM 07/15/2021   11:14 AM 02/18/2021   12:07 PM 07/09/2020   11:05 AM 01/23/2020    1:40 PM  PHQ 2/9 Scores  PHQ - 2 Score 0 2 2 0 1 0 0  PHQ- 9 Score  '4 9  3  4    '$ Fall Risk    07/16/2022   10:57 AM 05/05/2022    1:57 PM 07/15/2021   11:23 AM 07/15/2021   12:35 AM 07/09/2020   11:09 AM  Fall Risk   Falls in the past year? 0 1 0 0 0  Number falls in past yr: 1 1 0 0 0  Comment off balance at times      Injury with Fall? 0 0 0 0 0  Risk for fall due to :   No Fall Risks    Follow up Education provided;Falls prevention discussed  Falls evaluation completed      FALL RISK PREVENTION PERTAINING TO THE HOME:  Any stairs in or around the home? Yes  If so, are there any without handrails? Yes  Home free of loose throw rugs in walkways, pet beds, electrical cords, etc? No  Adequate lighting in your home to reduce risk of falls? Yes   ASSISTIVE DEVICES UTILIZED TO PREVENT FALLS:  Life alert? No  Use of a cane, walker or w/c? No  Grab bars in the bathroom? No  Shower chair or bench in shower? No  Elevated toilet seat or a handicapped toilet? No        07/16/2022   11:16 AM 06/30/2018   11:00 AM 05/30/2016   10:29 AM  6CIT Screen  What Year? 0 points 0 points 0 points  What month? 0 points 0 points 0 points  What time? 0 points 0 points 0  points  Count back from 20 0 points 0 points 0 points  Months in reverse 0 points 0 points 0 points  Repeat phrase 0 points 0 points 4 points  Total Score 0 points 0 points 4 points    Immunizations Immunization History  Administered Date(s) Administered   Fluad Quad(high Dose 65+) 04/24/2021   H1N1 04/25/2008   Influenza Inj Mdck Quad Pf 04/10/2018   Influenza Split 07/20/2007   Influenza, Seasonal, Injecte, Preservative Fre 04/25/2008   Influenza,inj,Quad PF,6+ Mos 04/05/2019   Influenza-Unspecified 04/23/2013, 03/20/2017, 04/10/2018   PNEUMOCOCCAL CONJUGATE-20 04/24/2021   Pneumococcal Conjugate-13 05/11/2014   Tdap 09/21/2015, 09/03/2016   Zoster Recombinat (Shingrix)  09/10/2020   Zoster, Live 01/13/2011    TDAP status: Up to date  Flu Vaccine status: Due, Education has been provided regarding the importance of this vaccine. Advised may receive this vaccine at local pharmacy or Health Dept. Aware to provide a copy of the vaccination record if obtained from local pharmacy or Health Dept. Verbalized acceptance and understanding.  Pneumococcal vaccine status: Up to date  Covid-19 vaccine status: Declined, Education has been provided regarding the importance of this vaccine but patient still declined. Advised may receive this vaccine at local pharmacy or Health Dept.or vaccine clinic. Aware to provide a copy of the vaccination record if obtained from local pharmacy or Health Dept. Verbalized acceptance and understanding.  Qualifies for Shingles Vaccine? Yes   Zostavax completed Yes     Screening Tests Health Maintenance  Topic Date Due   COVID-19 Vaccine (1) Never done   Zoster Vaccines- Shingrix (2 of 2) 11/05/2020   INFLUENZA VACCINE  09/21/2022 (Originally 01/21/2022)   COLONOSCOPY (Pts 45-23yr Insurance coverage will need to be confirmed)  01/10/2023   Medicare Annual Wellness (AWV)  07/17/2023   MAMMOGRAM  09/25/2023   DEXA SCAN  01/03/2024   DTaP/Tdap/Td (3 - Td or  Tdap) 09/04/2026   Pneumonia Vaccine 67 Years old  Completed   Hepatitis C Screening  Completed   HPV VACCINES  Aged Out    Health Maintenance  Health Maintenance Due  Topic Date Due   COVID-19 Vaccine (1) Never done   Zoster Vaccines- Shingrix (2 of 2) 11/05/2020    Colorectal cancer screening: Type of screening: Colonoscopy. Completed yes. Repeat every 3 years Due 07/24  Mammogram status: Completed yes. Repeat every year Due 11/24  Bone Density status: Completed yes. Results reflect: Bone density results: NORMAL. Repeat every 5 years. Due 12/2023  Lung Cancer Screening: (Low Dose CT Chest recommended if Age 68-80years, 30 pack-year currently smoking OR have quit w/in 15years.) does not qualify.   Lung Cancer Screening Referral: no  Additional Screening:  Hepatitis C Screening: does qualify; Completed yes 04/08/2010  Vision Screening: Recommended annual ophthalmology exams for early detection of glaucoma and other disorders of the eye. Is the patient up to date with their annual eye exam?  Yes  Who is the provider or what is the name of the office in which the patient attends annual eye exams[place] is not established with a provider, would they like to be referred to a provider to establish care? No .   Dental Screening: Recommended annual dental exams for proper oral hygiene  Community Resource Referral / Chronic Care Management: CRR required this visit?  No   CCM required this visit?  No      Plan:     I have personally reviewed and noted the following in the patient's chart:   Medical and social history Use of alcohol, tobacco or illicit drugs  Current medications and supplements including opioid prescriptions. Patient is currently taking opioid prescriptions. Information provided to patient regarding non-opioid alternatives. Patient advised to discuss non-opioid treatment plan with their provider. Functional ability and status Nutritional  status Physical activity Advanced directives List of other physicians Hospitalizations, surgeries, and ER visits in previous 12 months Vitals Screenings to include cognitive, depression, and falls Referrals and appointments  In addition, I have reviewed and discussed with patient certain preventive protocols, quality metrics, and best practice recommendations. A written personalized care plan for preventive services as well as general preventive health recommendations were provided  to patient.     Roger Shelter, LPN   02/08/2992   Nurse Notes: Pt states she is going through a divorce but is managing alright at this time. Pt sts she is being seen by [facility] specialist to manage pain.

## 2022-07-21 ENCOUNTER — Other Ambulatory Visit: Payer: Self-pay | Admitting: Family Medicine

## 2022-07-21 DIAGNOSIS — E7800 Pure hypercholesterolemia, unspecified: Secondary | ICD-10-CM

## 2022-07-22 NOTE — Telephone Encounter (Signed)
Unable to refill per protocol, Rx request is too soon. Last refill 11/05/21 for 90 and 3 refills. Will refuse.  Requested Prescriptions  Pending Prescriptions Disp Refills   rosuvastatin (CRESTOR) 10 MG tablet [Pharmacy Med Name: Rosuvastatin Calcium 10 MG Oral Tablet] 100 tablet 2    Sig: TAKE 1 TABLET BY MOUTH DAILY     Cardiovascular:  Antilipid - Statins 2 Failed - 07/21/2022 10:13 PM      Failed - Lipid Panel in normal range within the last 12 months    Cholesterol, Total  Date Value Ref Range Status  08/27/2021 230 (H) 100 - 199 mg/dL Final   LDL Chol Calc (NIH)  Date Value Ref Range Status  08/27/2021 143 (H) 0 - 99 mg/dL Final   HDL  Date Value Ref Range Status  08/27/2021 72 >39 mg/dL Final   Triglycerides  Date Value Ref Range Status  08/27/2021 84 0 - 149 mg/dL Final         Passed - Cr in normal range and within 360 days    Creatinine, Ser  Date Value Ref Range Status  08/27/2021 0.97 0.57 - 1.00 mg/dL Final         Passed - Patient is not pregnant      Passed - Valid encounter within last 12 months    Recent Outpatient Visits           1 month ago Congestion of left ear   [place] Gwyneth Sprout, FNP   1 month ago Contusion of left wrist, initial encounter   [place], Donald E, MD   2 months ago Hoarseness, persistent   [place], Donald E, MD   11 months ago Chronic lumbar radicular pain (Right side)   [place], Donald E, MD   1 year ago Restless legs syndrome   [place], Donald E, MD       Future Appointments             In 1 month Vanga, Tally Due, MD [facility] at [facility]

## 2022-07-25 ENCOUNTER — Other Ambulatory Visit: Payer: Self-pay | Admitting: Family Medicine

## 2022-07-25 NOTE — Telephone Encounter (Signed)
Medication Refill - Medication: oxyCODONE (ROXICODONE) 15 MG immediate release tablet   Has the patient contacted their pharmacy? No. No, more refills.  (Agent: If no, request that the patient contact the pharmacy for the refill. If patient does not wish to contact the pharmacy document the reason why and proceed with request.)   Preferred Pharmacy (with phone number or street name):  [place], [place] [facility] [address]  [place] [address]  Phone: [phone] Fax: [phone]  Hours: Not open 24 hours   Has the patient been seen for an appointment in the last year OR does the patient have an upcoming appointment? Yes.    Agent: Please be advised that RX refills may take up to 3 business days. We ask that you follow-up with your pharmacy.

## 2022-07-25 NOTE — Telephone Encounter (Signed)
Requested medications are due for refill today.  unsure  Requested medications are on the active medications list.  yes  Last refill. 06/22/2022 #180 0 rf  Future visit scheduled.   no  Notes to clinic.  Refill not delegated.    Requested Prescriptions  Pending Prescriptions Disp Refills   oxyCODONE (ROXICODONE) 15 MG immediate release tablet 180 tablet 0    Sig: TAKE ONE TO TWO TABLETS EVERY FOUR HOURSAS NEEDED FOR PAIN     Not Delegated - Analgesics:  Opioid Agonists Failed - 07/25/2022 10:07 AM      Failed - This refill cannot be delegated      Failed - Urine Drug Screen completed in last 360 days      Passed - Valid encounter within last 3 months    Recent Outpatient Visits           1 month ago Congestion of left ear   [place] Gwyneth Sprout, FNP   1 month ago Contusion of left wrist, initial encounter   [facility] Birdie Sons, MD   2 months ago Hoarseness, persistent   [place], Donald E, MD   11 months ago Chronic lumbar radicular pain (Right side)   [facility] Birdie Sons, MD   1 year ago Restless legs syndrome   [place], Donald E, MD       Future Appointments             In 1 month Vanga, Tally Due, MD [facility]

## 2022-07-26 MED ORDER — OXYCODONE HCL 15 MG PO TABS: ORAL_TABLET | ORAL | 0 refills | Status: DC

## 2022-08-11 ENCOUNTER — Emergency Department (HOSPITAL_COMMUNITY): Payer: 59

## 2022-08-11 ENCOUNTER — Other Ambulatory Visit: Payer: Self-pay

## 2022-08-11 ENCOUNTER — Emergency Department (HOSPITAL_COMMUNITY)
Admission: EM | Admit: 2022-08-11 | Discharge: 2022-08-12 | Disposition: A | Discharge status: Home / Self Care | Payer: 59 | Attending: Emergency Medicine | Admitting: Emergency Medicine

## 2022-08-11 DIAGNOSIS — Y93G1 Activity, food preparation and clean up: Secondary | ICD-10-CM | POA: Insufficient documentation

## 2022-08-11 DIAGNOSIS — S99921A Unspecified injury of right foot, initial encounter: Secondary | ICD-10-CM | POA: Diagnosis present

## 2022-08-11 DIAGNOSIS — S92909A Unspecified fracture of unspecified foot, initial encounter for closed fracture: Secondary | ICD-10-CM

## 2022-08-11 DIAGNOSIS — Y92000 Kitchen of unspecified non-institutional (private) residence as  the place of occurrence of the external cause: Secondary | ICD-10-CM | POA: Diagnosis not present

## 2022-08-11 DIAGNOSIS — S92901A Unspecified fracture of right foot, initial encounter for closed fracture: Secondary | ICD-10-CM | POA: Diagnosis not present

## 2022-08-11 DIAGNOSIS — Z7982 Long term (current) use of aspirin: Secondary | ICD-10-CM | POA: Insufficient documentation

## 2022-08-11 DIAGNOSIS — W1642XA Fall into unspecified water causing other injury, initial encounter: Secondary | ICD-10-CM | POA: Insufficient documentation

## 2022-08-11 NOTE — ED Provider Triage Note (Signed)
Emergency Medicine Provider Triage Evaluation Note  TRISTI ECONOMY , a 67 y.o. female  was evaluated in triage.  Pt complains of right foot injury. Her fiance had spilled water in the kitchen and when she tried to wipe it up she slipped, her right foot came up underneath her. No head trauma.   Review of Systems  Positive: Right foot pain Negative:   Physical Exam  BP (!) 149/82 (BP Location: Right Arm)   Pulse (!) 106   Temp 99 F (37.2 C) (Oral)   Resp 20   SpO2 98%  Gen:   Awake, no distress   Resp:  Normal effort  MSK:   Moves extremities without difficulty  Other:  Swelling and mild bruising to dorsum of right foot, can bear weight but is painful  Medical Decision Making  Medically screening exam initiated at 9:40 PM.  Appropriate orders placed.  KELISA MESMAN was informed that the remainder of the evaluation will be completed by another provider, this initial triage assessment does not replace that evaluation, and the importance of remaining in the ED until their evaluation is complete.  X-ray ordered   Jessyca Sloan T, PA-C 08/11/22 2142

## 2022-08-11 NOTE — ED Triage Notes (Signed)
Patient slipped and injured her right foot this evening , reports right foot pain with swelling .

## 2022-08-12 ENCOUNTER — Emergency Department (HOSPITAL_COMMUNITY): Payer: 59

## 2022-08-12 MED ORDER — OXYCODONE-ACETAMINOPHEN 5-325 MG PO TABS
2.0000 | ORAL_TABLET | Freq: Once | ORAL | Status: AC
  Administered 2022-08-12: 2 via ORAL
  Filled 2022-08-12: qty 2

## 2022-08-12 NOTE — ED Notes (Signed)
Ortho Tech called. 

## 2022-08-12 NOTE — ED Provider Notes (Signed)
[facility] Provider Note   CSN: [id number] Arrival date & time: 08/11/22  2110     History  Chief Complaint  Patient presents with   Foot Injury    Courtney Ross is a 67 y.o. female.  The history is provided by the patient and medical records.  Foot Injury  67 year old female presenting to the ED with right foot injury.  States she was washing dishes and went to clean up some water on the floor when she slipped and right foot bent backwards behind her.  States initially she seemed to have a deformity but that seems to have corrected since then.  She denies any head injury or loss of consciousness.  She has not been able to bear weight on affected foot since this occurred.  No prior history of right foot injuries.  Takes chronic oxycodone for pain, has not had any in the past few hours.  Home Medications Prior to Admission medications   Medication Sig Start Date End Date Taking? Authorizing Provider  amoxicillin-clavulanate (AUGMENTIN) 875-125 MG tablet Take 1 tablet by mouth 2 (two) times daily. Patient not taking: Reported on 07/16/2022 06/13/22   Gwyneth Sprout, FNP  aspirin EC 81 MG tablet Take 81 mg by mouth at bedtime.    [provider]  buPROPion (WELLBUTRIN SR) 150 MG 12 hr tablet TAKE 1 TABLET BY MOUTH 3 TIMES  DAILY 09/30/21   Birdie Sons, MD  Cholecalciferol (VITAMIN D3) 5000 UNITS CAPS Take 5,000 Units by mouth daily.     [provider]  Coenzyme Q10 200 MG capsule Take 200 mg by mouth daily.    [provider]  cyclobenzaprine (FLEXERIL) 10 MG tablet Take 1 tablet (10 mg total) by mouth 3 (three) times daily as needed for muscle spasms. 06/13/22   Gwyneth Sprout, FNP  diazepam (VALIUM) 10 MG tablet TAKE 1 TABLET BY MOUTH EVERY 6 HOURS AS NEEDED FOR ANXIETY. 06/13/22   Gwyneth Sprout, FNP  diclofenac Sodium (VOLTAREN) 1 % GEL SMARTSIG:1 Gram(s) Topical 4 Times Daily PRN 04/24/21   Birdie Sons, MD  estradiol (ESTRACE) 1 MG tablet TAKE 1 TABLET BY MOUTH DAILY 07/14/22   Birdie Sons, MD  famotidine (PEPCID) 20 MG tablet Take 1 tablet (20 mg total) by mouth every evening. Patient not taking: Reported on 07/16/2022 05/05/22   Birdie Sons, MD  fluticasone [facility]) 50 MCG/ACT nasal spray Place 1 spray into both nostrils daily as needed for allergies or rhinitis.    [provider]  hydroquinone 4 % cream Apply 1 application topically daily. Patient not taking: Reported on 07/16/2022 11/11/19   Rubie Maid, MD  lidocaine (LIDODERM) 5 % Place 1 patch onto the skin daily. Remove & Discard patch within 12 hours or as directed by MD 05/12/22   Birdie Sons, MD  LINZESS 145 MCG CAPS capsule TAKE 1 CAPSULE BY MOUTH  DAILY 02/22/22   Birdie Sons, MD  meloxicam (MOBIC) 15 MG tablet TAKE 1 TABLET BY MOUTH AT  BEDTIME AS NEEDED FOR PAIN 12/27/21   Birdie Sons, MD  Multiple Vitamin (MULTIVITAMIN WITH MINERALS) TABS tablet Take 1 tablet by mouth daily.    [provider]  MYRBETRIQ 25 MG TB24 tablet TAKE 1 TABLET BY MOUTH  DAILY 12/27/21   Birdie Sons, MD  omeprazole (PRILOSEC) 40 MG capsule Take 1 capsule (40 mg total) by mouth 2 (two) times daily before  a meal. 05/08/22 08/06/22  Vanga, Tally Due, MD  oxyCODONE (ROXICODONE) 15 MG immediate release tablet TAKE ONE TO TWO TABLETS EVERY FOUR HOURSAS NEEDED FOR PAIN 07/26/22   Birdie Sons, MD  polyethylene glycol (MIRALAX / Floria Raveling) packet Take 17 g by mouth 2 (two) times daily as needed for mild constipation (dissolve in an 8 oz glass of water). Patient not taking: Reported on 07/16/2022 07/21/18   Birdie Sons, MD  pramipexole (MIRAPEX) 0.25 MG tablet Take 2 tablets (0.5 mg total) by mouth at bedtime. 04/01/22   Birdie Sons, MD  pregabalin (LYRICA) 150 MG capsule Take 150 mg by mouth 2 (two) times daily. 02/07/21   [provider]  RESTASIS 0.05 % ophthalmic emulsion Place 1 drop into both eyes  2 (two) times daily.  11/21/15   [provider]  rosuvastatin (CRESTOR) 10 MG tablet TAKE 1 TABLET BY MOUTH DAILY 11/05/21   Birdie Sons, MD  sertraline (ZOLOFT) 50 MG tablet Take 1 tablet (50 mg total) by mouth daily. 06/13/22   Birdie Sons, MD      Allergies    Hydrocodone, Lisinopril, Mirtazapine, and Other    Review of Systems   Review of Systems  Musculoskeletal:  Positive for arthralgias.  All other systems reviewed and are negative.   Physical Exam Updated Vital Signs BP (!) 149/82 (BP Location: Right Arm)   Pulse (!) 106   Temp 99 F (37.2 C) (Oral)   Resp 20   SpO2 98%   Physical Exam Vitals and nursing note reviewed.  Constitutional:      Appearance: She is well-developed.  HENT:     Head: Normocephalic and atraumatic.  Eyes:     Conjunctiva/sclera: Conjunctivae normal.     Pupils: Pupils are equal, round, and reactive to light.  Cardiovascular:     Rate and Rhythm: Normal rate and regular rhythm.     Heart sounds: Normal heart sounds.  Pulmonary:     Effort: Pulmonary effort is normal.     Breath sounds: Normal breath sounds.  Abdominal:     General: Bowel sounds are normal.     Palpations: Abdomen is soft.  Musculoskeletal:        General: Normal range of motion.     Cervical back: Normal range of motion.     Comments: Fairly significant swelling and bruising diffusely noted to dorsal right foot, ankle and lower leg are non-tender, DP pulse intact, moving toes normally  Skin:    General: Skin is warm and dry.  Neurological:     Mental Status: She is alert and oriented to person, place, and time.     ED Results / Procedures / Treatments   Labs (all labs ordered are listed, but only abnormal results are displayed) Labs Reviewed - No data to display  EKG None  Radiology CT Foot Right Wo Contrast  Result Date: 08/12/2022 CLINICAL DATA:  Foot fractures with Lisfranc injury suspected on plain films. EXAM: CT OF THE RIGHT FOOT  WITHOUT CONTRAST TECHNIQUE: Multidetector CT imaging of the right foot was performed according to the standard protocol. Multiplanar CT image reconstructions were also generated. RADIATION DOSE REDUCTION: This exam was performed according to the departmental dose-optimization program which includes automated exposure control, adjustment of the mA and/or kV according to patient size and/or use of iterative reconstruction technique. COMPARISON:  Three-view right foot series yesterday FINDINGS: Bones/Joint/Cartilage There is an acute nondisplaced oblique intra-articular fracture of the plantar medial aspect of  the first metatarsal base. There is an acute comminuted intra-articular fracture of the second metatarsal base, with no significant displacement of the major fragments. There is a small comminution fragment displaced posteriorly into the distal space between the second and third cuneiforms. There are a few tiny chip fractures off the dorsal and anterolateral aspect of the third cuneiform bone, with intact third metatarsal. There is a transverse nondisplaced fracture of the proximal metaphysis of the fourth metatarsal and a linear chip fracture of the medial surface of the metatarsal base. There are small chip fractures off of the lateral surface of the cuboid bone also noted. There is no significant lateral translation of the metatarsal bases in relation to the cuneiforms, but the location of the second metatarsal injury likely involves at least 1 of the 3 Lisfranc bands, specifically most likely the interosseous band. There are postsurgical changes of first metatarsal bunionectomy with a short metal pin in the distal first metatarsal and metallic wiring in the distal great toe proximal phalanx. There is healed fracture deformity in the distal first metatarsal. Joint narrowing and trace spurring is noted at the first MTP joint. There is a well-formed ossicle at the anterior tip of the lateral malleolus which is  probably a remote chip fracture. Ligaments Suboptimally assessed by CT. Muscles and Tendons Also not well evaluated with this technique. The intrinsic plantar foot muscles show normal bulk. Major tendons are grossly intact as far as seen. Soft tissues Moderate diffuse edema. There is a 2.2 x 1.7 cm subcutaneous collection just below the proximal plantar fascia, Hounsfield density of 50 units, most likely a small hematoma. IMPRESSION: 1. Acute comminuted intra-articular fracture of the second metatarsal base, with no significant displacement of the major fragments. 2. Small comminution fragment displaced posteriorly into the distal space between the second and third cuneiforms. 3. There is no significant lateral translation of the metatarsal bases in relation to the cuneiforms, but the location of the second metatarsal base injury likely involves at least 1 of the 3 Lisfranc bands, specifically most likely the interosseous band if not the other 2 bands. 4. Acute nondisplaced oblique intra-articular fracture of the plantar medial aspect of the first metatarsal base. 5. Acute nondisplaced transverse fracture of the proximal metaphysis of the fourth metatarsal and a linear chip fracture of the medial surface of the metatarsal base. 6. Small chip fractures off of the dorsal and anterolateral aspect of the third cuneiform bone, with intact third metatarsal. 7. Small chip fractures off of the lateral surface of the cuboid bone. 8. 2.2 x 1.7 cm subcutaneous collection just below the proximal plantar fascia, most likely a small hematoma. 9. Moderate diffuse surface edema. Electronically Signed   By: Telford Nab M.D.   On: 08/12/2022 02:57   DG Foot Complete Right  Result Date: 08/11/2022 CLINICAL DATA:  [place], right foot injury EXAM: RIGHT FOOT COMPLETE - 3+ VIEW COMPARISON:  07/09/2010 FINDINGS: Frontal, oblique, lateral views of the right foot are obtained. Stable postsurgical changes of the first metatarsal and  first proximal phalanx. There is an intra-articular fracture at the base of the second metatarsal seen on the frontal view, with with widening of the tarsometatarsal joint space and slight lateral displacement of the second metatarsal relative to the tarsal bone. The remaining tarsometatarsal joints appearing grossly normal anatomic alignment. Marked soft tissue swelling throughout the forefoot and midfoot. IMPRESSION: 1. Minimally displaced intra-articular fracture at the base of the second metatarsal, with slight lateral displacement of the second metatarsal  and widening of the tarsometatarsal joint. Underlying Lisfranc injury is suspected. The remaining tarsometatarsal joints are in grossly anatomic alignment. Follow-up CT or MRI could be performed for better assessment of the midfoot. 2. Diffuse soft tissue swelling of the midfoot and forefoot. Electronically Signed   By: Randa Ngo M.D.   On: 08/11/2022 22:19    Procedures Procedures    Medications Ordered in ED Medications  oxyCODONE-acetaminophen (PERCOCET/ROXICET) 5-325 MG per tablet 2 tablet (has no administration in time range)    ED Course/ Medical Decision Making/ A&P                             Medical Decision Making Amount and/or Complexity of Data Reviewed Radiology: ordered and independent interpretation performed.  Risk Prescription drug management.   67 y.o.F here with right foot injury after slipping in water in kitchen.  No head injury or LOC.  Does have obvious swelling/bruising to dorsal right foot but remains NVI.  X-ray with comminuted fractures, concern for lis franc injury.  Will get CT.  CT with unfortunately significant injuries to right foot-- comminuted 1st and 2nd metatarsal fractures, apparent lis franc injury, chip fractures of cuneiform, cuboid, etc.  Also has apparent hematoma.  Will discuss with orthopedics.  Discussed with on call orthopedics, Dr. Alvan Dame-- recommends posterior splint with stirrups,  elevation for next 24 hours.  Will need to see Dr. Doran Durand in clinic on Wednesday.    Patient reassessed-- foot remains NVI.  She is swollen but still with bounding pulse, normal sensation to the toes, good movement.  No findings concerning for compartment syndrome.  I have discussed plan with patient and significant other at bedside, all voiced understanding.  CT has burned disc for her to take to ortho office for operative planning.  She is already on home oxycodone, continue this.  Can return here for any new/acute changes.  Final Clinical Impression(s) / ED Diagnoses Final diagnoses:  Multiple closed fractures of foot, initial encounter    Rx / DC Orders ED Discharge Orders     None         Larene Pickett, PA-C 08/12/22 0514    Quintella Reichert, MD 08/12/22 [phone]

## 2022-08-12 NOTE — Progress Notes (Signed)
Orthopedic Tech Progress Note Patient Details:  Courtney Ross 08/12/1955 [id number]  Ortho Devices Type of Ortho Device: Crutches, Post (short leg) splint, Stirrup splint Ortho Device/Splint Location: rle Ortho Device/Splint Interventions: Ordered, Application, Adjustment   Post Interventions Patient Tolerated: Well Instructions Provided: Care of device, Adjustment of device  [name] 08/12/2022, 5:07 AM

## 2022-08-12 NOTE — Discharge Instructions (Signed)
Call orthopedic office in the morning-- you need to be seen by Dr. Doran Durand.  He will be in the office Wednesday. Keep foot elevated above level of the heart to keep swelling down. Return here for any new/acute changes.

## 2022-08-14 ENCOUNTER — Telehealth: Payer: Self-pay

## 2022-08-14 NOTE — Transitions of Care (Post Inpatient/ED Visit) (Signed)
   08/14/2022  Name: Courtney Ross MRN: [id number] DOB: [birth year]  Today's TOC FU Call Status: Today's TOC FU Call Status:: Successful TOC FU Call Competed TOC FU Call Complete Date: 08/14/22  Transition Care Management Follow-up Telephone Call Date of Discharge: 08/12/22 [facility]) Type of Discharge: Emergency Department How have you been since you were released from the hospital?: Same Any questions or concerns?: No  Items Reviewed: Did you receive and understand the discharge instructions provided?: Yes Medications obtained and verified?: Yes (Medications Reviewed) Any new allergies since your discharge?: No Dietary orders reviewed?: No Do you have support at home?: Yes People in Home: child(ren), adult  Home Care and Equipment/Supplies: [place] Ordered?: No Any new equipment or medical supplies ordered?: No  Functional Questionnaire: Do you need assistance with bathing/showering or dressing?: Yes (Daughter will be able to help) Do you need assistance with meal preparation?: Yes (Friends and family providing meals) Do you need assistance with eating?: No Do you have difficulty maintaining continence: No Do you need assistance with getting out of bed/getting out of a chair/moving?: No Do you have difficulty managing or taking your medications?: No  Folllow up appointments reviewed: PCP Follow-up appointment confirmed?: No Specialist Hospital Follow-up appointment confirmed?: Yes Date of Specialist follow-up appointment?: 08/15/22 Follow-Up Specialty Provider:: [facility] Do you need transportation to your follow-up appointment?: No Do you understand care options if your condition(s) worsen?: Yes-patient verbalized understanding  SDOH Interventions Today    Flowsheet Row Most Recent Value  SDOH Interventions   Food Insecurity Interventions Intervention Not Indicated  Housing Interventions Intervention Not  Indicated  Transportation Interventions Intervention Not Indicated  Utilities Interventions Intervention Not Indicated       [place]

## 2022-08-18 ENCOUNTER — Ambulatory Visit: Payer: Self-pay

## 2022-08-18 ENCOUNTER — Encounter: Payer: Self-pay | Admitting: Family Medicine

## 2022-08-18 ENCOUNTER — Telehealth (INDEPENDENT_AMBULATORY_CARE_PROVIDER_SITE_OTHER): Payer: 59 | Admitting: Family Medicine

## 2022-08-18 VITALS — BP 134/80 | Temp 98.6°F

## 2022-08-18 DIAGNOSIS — J0140 Acute pansinusitis, unspecified: Secondary | ICD-10-CM

## 2022-08-18 MED ORDER — AMOXICILLIN-POT CLAVULANATE 875-125 MG PO TABS: 1.0000 | ORAL_TABLET | Freq: Two times a day (BID) | ORAL | 0 refills | Status: AC

## 2022-08-18 NOTE — Progress Notes (Signed)
I,Sulibeya S Dimas,acting as a Education administrator for Lavon Paganini, MD.,have documented all relevant documentation on the behalf of Lavon Paganini, MD,as directed by  Lavon Paganini, MD while in the presence of Lavon Paganini, MD.   MyChart Video Visit    Virtual Visit via Video Note   This format is felt to be most appropriate for this patient at this time. Physical exam was limited by quality of the video and audio technology used for the visit.    Patient location: home Provider location: [place] involved in the visit: patient, provider   I discussed the limitations of evaluation and management by telemedicine and the availability of in person appointments. The patient expressed understanding and agreed to proceed.  Patient: Courtney Ross   DOB: Sep 26, 1955   67 y.o. Female  MRN: [id number] Visit Date: 08/18/2022  Today's healthcare provider: Lavon Paganini, MD   No chief complaint on file.  Subjective    HPI  Upper respiratory symptoms She complains of congestion, nasal congestion, productive cough with  green colored sputum, and sinus pressure.with no fever, chills, night sweats or weight loss. Onset of symptoms was  2 weeks ago and gradually improving.She is drinking plenty of fluids.  Past history is significant for no history of pneumonia or bronchitis. Patient is non-smoker. Patient taking Mucinex and used old ABX from December, Augmentin 5 tablets.   ---------------------------------------------------------------------------------------------------    Medications: Outpatient Medications Prior to Visit  Medication Sig   aspirin EC 81 MG tablet Take 81 mg by mouth at bedtime.   buPROPion (WELLBUTRIN SR) 150 MG 12 hr tablet TAKE 1 TABLET BY MOUTH 3 TIMES  DAILY   Cholecalciferol (VITAMIN D3) 5000 UNITS CAPS Take 5,000 Units by mouth daily.    Coenzyme Q10 200 MG capsule Take 200 mg by mouth daily.   cyclobenzaprine (FLEXERIL) 10 MG  tablet Take 1 tablet (10 mg total) by mouth 3 (three) times daily as needed for muscle spasms.   diazepam (VALIUM) 10 MG tablet TAKE 1 TABLET BY MOUTH EVERY 6 HOURS AS NEEDED FOR ANXIETY.   diclofenac Sodium (VOLTAREN) 1 % GEL SMARTSIG:1 Gram(s) Topical 4 Times Daily PRN   estradiol (ESTRACE) 1 MG tablet TAKE 1 TABLET BY MOUTH DAILY   fluticasone (FLONASE) 50 MCG/ACT nasal spray Place 1 spray into both nostrils daily as needed for allergies or rhinitis.   lidocaine (LIDODERM) 5 % Place 1 patch onto the skin daily. Remove & Discard patch within 12 hours or as directed by MD   LINZESS 145 MCG CAPS capsule TAKE 1 CAPSULE BY MOUTH  DAILY   meloxicam (MOBIC) 15 MG tablet TAKE 1 TABLET BY MOUTH AT  BEDTIME AS NEEDED FOR PAIN   Multiple Vitamin (MULTIVITAMIN WITH MINERALS) TABS tablet Take 1 tablet by mouth daily.   MYRBETRIQ 25 MG TB24 tablet TAKE 1 TABLET BY MOUTH  DAILY   oxyCODONE (ROXICODONE) 15 MG immediate release tablet TAKE ONE TO TWO TABLETS EVERY FOUR HOURSAS NEEDED FOR PAIN   pramipexole (MIRAPEX) 0.25 MG tablet Take 2 tablets (0.5 mg total) by mouth at bedtime.   pregabalin (LYRICA) 150 MG capsule Take 150 mg by mouth 2 (two) times daily.   RESTASIS 0.05 % ophthalmic emulsion Place 1 drop into both eyes 2 (two) times daily.    rosuvastatin (CRESTOR) 10 MG tablet TAKE 1 TABLET BY MOUTH DAILY   sertraline (ZOLOFT) 50 MG tablet Take 1 tablet (50 mg total) by mouth daily.   omeprazole (PRILOSEC) 40 MG capsule  Take 1 capsule (40 mg total) by mouth 2 (two) times daily before a meal.   No facility-administered medications prior to visit.    Review of Systems  Constitutional:  Negative for chills and fever.  HENT:  Positive for congestion, postnasal drip, sinus pressure and sinus pain. Negative for ear pain and rhinorrhea.   Respiratory:  Positive for cough. Negative for shortness of breath and wheezing.   Gastrointestinal:  Negative for abdominal pain, nausea and vomiting.        Objective    BP 134/80   Temp 98.6 F (37 C) (Temporal)      Physical Exam Constitutional:      General: She is not in acute distress.    Appearance: Normal appearance.  HENT:     Head: Normocephalic.  Pulmonary:     Effort: Pulmonary effort is normal. No respiratory distress.  Neurological:     Mental Status: She is alert and oriented to person, place, and time. Mental status is at baseline.        Assessment & Plan     1. Acute non-recurrent pansinusitis - symptoms and exam c/w sinusitis   - no evidence of AOM, CAP, strep pharyngitis, or other infection - given duration of symptoms, suspect bacterial etiology - will treat with Augmentin x7d - discussed symptomatic management (flonase, decongestants, etc), natural course, and return precautions    Meds ordered this encounter  Medications   amoxicillin-clavulanate (AUGMENTIN) 875-125 MG tablet    Sig: Take 1 tablet by mouth 2 (two) times daily for 7 days.    Dispense:  14 tablet    Refill:  0     Return if symptoms worsen or fail to improve.     I discussed the assessment and treatment plan with the patient. The patient was provided an opportunity to ask questions and all were answered. The patient agreed with the plan and demonstrated an understanding of the instructions.   The patient was advised to call back or seek an in-person evaluation if the symptoms worsen or if the condition fails to improve as anticipated.  I, Lavon Paganini, MD, have reviewed all documentation for this visit. The documentation on 08/18/22 for the exam, diagnosis, procedures, and orders are all accurate and complete.   Aviva Wolfer, Dionne Bucy, MD, MPH [facility]

## 2022-08-18 NOTE — Telephone Encounter (Signed)
  Chief Complaint: Cough with green mucous/ phlegm Symptoms: above Frequency: 2 weeks Pertinent Negatives: Patient denies  Disposition: []$ ED /[]$ Urgent Care (no appt availability in office) / [x]$ Appointment(In office/virtual)/ []$  [facility]/ []$ Home Care/ []$ Refused Recommended Disposition /[]$ [facility]/ []$  Follow-up with PCP Additional Notes: Pt has had cough for 2 weeks. Pt has light to med green mucous/ phlegm. Pt stating taking "leftover" amoxicillin. PT would like more antibiotics.  Summary: cough / rx req   The patient has experienced cold like symptoms for roughly two weeks  The patient shares that they are experiencing cough and congestion that is dark in color  The patient would like to be prescribed an antibiotic for their concerns  Please contact further when possible     Reason for Disposition  Cough has been present for > 3 weeks  Answer Assessment - Initial Assessment Questions 1. ONSET: "When did the cough begin?"      2 weeks 2. SEVERITY: "How bad is the cough today?"      Mild - moderate 3. SPUTUM: "Describe the color of your sputum" (none, dry cough; clear, white, yellow, green)     Green 4. HEMOPTYSIS: "Are you coughing up any blood?" If so ask: "How much?" (flecks, streaks, tablespoons, etc.)     no 5. DIFFICULTY BREATHING: "Are you having difficulty breathing?" If Yes, ask: "How bad is it?" (e.g., mild, moderate, severe)    - MILD: No SOB at rest, mild SOB with walking, speaks normally in sentences, can lie down, no retractions, pulse < 100.    - MODERATE: SOB at rest, SOB with minimal exertion and prefers to sit, cannot lie down flat, speaks in phrases, mild retractions, audible wheezing, pulse 100-120.    - SEVERE: Very SOB at rest, speaks in single words, struggling to breathe, sitting hunched forward, retractions, pulse > 120      no 6. FEVER: "Do you have a fever?" If Yes, ask: "What is your temperature, how was it measured, and when  did it start?"     no 7. CARDIAC HISTORY: "Do you have any history of heart disease?" (e.g., heart attack, congestive heart failure)      no 8. LUNG HISTORY: "Do you have any history of lung disease?"  (e.g., pulmonary embolus, asthma, emphysema)     no 9. PE RISK FACTORS: "Do you have a history of blood clots?" (or: recent major surgery, recent prolonged travel, bedridden)     no 10. OTHER SYMPTOMS: "Do you have any other symptoms?" (e.g., runny nose, wheezing, chest pain)       Runny nose has resolved  Protocols used: Cough - Acute Productive-A-AH

## 2022-08-18 NOTE — Chronic Care Management (AMB) (Signed)
   08/18/2022  Courtney Ross January 29, 1956 [id number]   Reason for Encounter: Change in CCM enrollment status   Horris Latino RN Care Manager/Chronic Care Management [phone]

## 2022-08-22 ENCOUNTER — Other Ambulatory Visit: Payer: Self-pay | Admitting: Family Medicine

## 2022-08-22 NOTE — Telephone Encounter (Signed)
Medication Refill - Medication: oxyCODONE (ROXICODONE) 15 MG immediate release tablet [id number]   Has the patient contacted their pharmacy? Yes.    (Agent: If yes, when and what did the pharmacy advise?) Contact provider   Preferred Pharmacy (with phone number or street name): [place], [place] - [place]   Has the patient been seen for an appointment in the last year OR does the patient have an upcoming appointment? Yes.    Agent: Please be advised that RX refills may take up to 3 business days. We ask that you follow-up with your pharmacy.

## 2022-08-25 MED ORDER — OXYCODONE HCL 15 MG PO TABS: ORAL_TABLET | ORAL | 0 refills | Status: DC

## 2022-08-25 NOTE — Telephone Encounter (Signed)
Requested medications are due for refill today.  Provider to decide  Requested medications are on the active medications list.  yes  Last refill. 07/26/2022 #180 0 rf  Future visit scheduled.   no  Notes to clinic.  Refill not delegated.    Requested Prescriptions  Pending Prescriptions Disp Refills   oxyCODONE (ROXICODONE) 15 MG immediate release tablet 180 tablet 0    Sig: TAKE ONE TO TWO TABLETS EVERY FOUR HOURSAS NEEDED FOR PAIN     Not Delegated - Analgesics:  Opioid Agonists Failed - 08/22/2022  2:23 PM      Failed - This refill cannot be delegated      Failed - Urine Drug Screen completed in last 360 days      Passed - Valid encounter within last 3 months    Recent Outpatient Visits           1 week ago Acute non-recurrent pansinusitis   [place], Dionne Bucy, MD   2 months ago Congestion of left ear   [place] Tally Joe T, FNP   2 months ago Contusion of left wrist, initial encounter   [facility] Birdie Sons, MD   3 months ago Hoarseness, persistent   [place], Donald E, MD   12 months ago Chronic lumbar radicular pain (Right side)   [place], Donald E, MD       Future Appointments             In 2 weeks Vanga, Tally Due, MD [facility]

## 2022-08-27 ENCOUNTER — Other Ambulatory Visit: Payer: Self-pay | Admitting: Family Medicine

## 2022-08-27 DIAGNOSIS — N63 Unspecified lump in unspecified breast: Secondary | ICD-10-CM

## 2022-08-27 DIAGNOSIS — R928 Other abnormal and inconclusive findings on diagnostic imaging of breast: Secondary | ICD-10-CM

## 2022-08-27 NOTE — Telephone Encounter (Signed)
Pharmacy was having electronic issues and needs Rx for oxyCODONE (ROXICODONE) 15 MG immediate release tablet Resent / please advise

## 2022-08-28 MED ORDER — OXYCODONE HCL 15 MG PO TABS: ORAL_TABLET | ORAL | 0 refills | Status: DC

## 2022-08-28 NOTE — Telephone Encounter (Signed)
Pt is calling back stating that [facility] still has not received the medication refill request and is wanting to know if it can be called in to them. Pt states that she is out of her medication. Please advise

## 2022-09-02 ENCOUNTER — Telehealth: Payer: Self-pay | Admitting: Family Medicine

## 2022-09-02 NOTE — Telephone Encounter (Signed)
Pt contacted [facility] for their medication refill and the pharmacy told the pt that they would need Dr. Caryn Section to fill out a form before they could refill it. [facility] says they faxed over the paperwork. Pt wants to know if the paperwork has been received. Please advise.

## 2022-09-03 ENCOUNTER — Encounter: Payer: Self-pay | Admitting: Family Medicine

## 2022-09-03 NOTE — Telephone Encounter (Signed)
I haven't seen any forms regarding this mediction.

## 2022-09-04 ENCOUNTER — Other Ambulatory Visit: Payer: Self-pay | Admitting: Family Medicine

## 2022-09-04 DIAGNOSIS — F324 Major depressive disorder, single episode, in partial remission: Secondary | ICD-10-CM

## 2022-09-04 NOTE — Telephone Encounter (Signed)
Advised 

## 2022-09-05 ENCOUNTER — Telehealth: Payer: Self-pay | Admitting: Family Medicine

## 2022-09-05 ENCOUNTER — Other Ambulatory Visit: Payer: Self-pay | Admitting: Family Medicine

## 2022-09-05 NOTE — Telephone Encounter (Signed)
Requested medications are due for refill today.  unsure  Requested medications are on the active medications list.  no  Last refill. 08/19/2022 #14 0 rf   Future visit scheduled.   no  Notes to clinic.  Please review for refill.    Requested Prescriptions  Pending Prescriptions Disp Refills   XARELTO 10 MG TABS tablet [Pharmacy Med Name: XARELTO 10 MG TAB] 14 tablet     Sig: TAKE 1 TABLET BY MOUTH DAILY     Hematology: Anticoagulants - rivaroxaban Failed - 09/05/2022  4:46 PM      Failed - ALT in normal range and within 360 days    ALT  Date Value Ref Range Status  08/27/2021 34 (H) 0 - 32 IU/L Final         Failed - AST in normal range and within 360 days    AST  Date Value Ref Range Status  08/27/2021 31 0 - 40 IU/L Final         Failed - Cr in normal range and within 360 days    Creatinine, Ser  Date Value Ref Range Status  08/27/2021 0.97 0.57 - 1.00 mg/dL Final         Failed - HCT in normal range and within 360 days    Hematocrit  Date Value Ref Range Status  08/27/2021 43.8 34.0 - 46.6 % Final         Failed - HGB in normal range and within 360 days    Hemoglobin  Date Value Ref Range Status  08/27/2021 14.3 11.1 - 15.9 g/dL Final         Failed - PLT in normal range and within 360 days    Platelets  Date Value Ref Range Status  08/27/2021 287 150 - 450 x10E3/uL Final         Failed - eGFR is 15 or above and within 360 days    GFR calc Af Amer  Date Value Ref Range Status  08/01/2020 78 >59 mL/min/1.73 Final    Comment:    **In accordance with recommendations from the NKF-ASN Task force,**   [facility] is in the process of updating its eGFR calculation to the   2021 CKD-EPI creatinine equation that estimates kidney function   without a race variable.    GFR calc non Af Amer  Date Value Ref Range Status  08/01/2020 67 >59 mL/min/1.73 Final   eGFR  Date Value Ref Range Status  08/27/2021 64 >59 mL/min/1.73 Final         Passed - Patient is  not pregnant      Passed - Valid encounter within last 12 months    Recent Outpatient Visits           2 weeks ago Acute non-recurrent pansinusitis   [place], Dionne Bucy, MD   2 months ago Congestion of left ear   [place] Gwyneth Sprout, FNP   3 months ago Contusion of left wrist, initial encounter   [facility] Birdie Sons, MD   4 months ago Hoarseness, persistent   [place], Donald E, MD   1 year ago Chronic lumbar radicular pain (Right side)   [facility] Birdie Sons, MD

## 2022-09-05 NOTE — Telephone Encounter (Signed)
Status: PA RequestCreated: March 14th, 2024 [id number] Sent: March 15th, 2024

## 2022-09-05 NOTE — Telephone Encounter (Signed)
[facility] requesting prior authorization  [facility]: [id number] Name: [place] Oxycodone HCI 15mg  Tablets

## 2022-09-08 ENCOUNTER — Ambulatory Visit: Payer: 59 | Admitting: Gastroenterology

## 2022-09-08 NOTE — Telephone Encounter (Signed)
FYI  Patient stated she did not need this refilled, it was given to her after her foot surgery and they now have her on ASA.

## 2022-09-08 NOTE — Telephone Encounter (Signed)
I have no idea why patient was prescribed this. Maybe patient can give more information.

## 2022-09-10 NOTE — Telephone Encounter (Signed)
N/A on March 15 This medication or product is on your plan's list of covered drugs. Prior authorization is not required at this time. If your pharmacy has questions regarding the processing of your prescription, please have them call the [facility] pharmacy help desk at [phone]. **Please note: This request was submitted electronically. Formulary lowering, tiering exception, cost reduction and/or pre-benefit determination review (including prospective Medicare hospice reviews) requests cannot be requested using this method of submission. Providers contact us at [phone] for further assistance.

## 2022-09-13 ENCOUNTER — Other Ambulatory Visit: Payer: Self-pay | Admitting: Family Medicine

## 2022-09-15 NOTE — Telephone Encounter (Signed)
Request to increase refill number to 100- will increase for remainder of Rx. Requested Prescriptions  Pending Prescriptions Disp Refills   meloxicam (MOBIC) 15 MG tablet [Pharmacy Med Name: Meloxicam 15 MG Oral Tablet] 100 tablet 2    Sig: TAKE 1 TABLET BY MOUTH AT  BEDTIME AS NEEDED FOR PAIN     Analgesics:  COX2 Inhibitors Failed - 09/13/2022 10:44 PM      Failed - Manual Review: Labs are only required if the patient has taken medication for more than 8 weeks.      Failed - HGB in normal range and within 360 days    Hemoglobin  Date Value Ref Range Status  08/27/2021 14.3 11.1 - 15.9 g/dL Final         Failed - Cr in normal range and within 360 days    Creatinine, Ser  Date Value Ref Range Status  08/27/2021 0.97 0.57 - 1.00 mg/dL Final         Failed - HCT in normal range and within 360 days    Hematocrit  Date Value Ref Range Status  08/27/2021 43.8 34.0 - 46.6 % Final         Failed - AST in normal range and within 360 days    AST  Date Value Ref Range Status  08/27/2021 31 0 - 40 IU/L Final         Failed - ALT in normal range and within 360 days    ALT  Date Value Ref Range Status  08/27/2021 34 (H) 0 - 32 IU/L Final         Failed - eGFR is 30 or above and within 360 days    GFR calc Af Amer  Date Value Ref Range Status  08/01/2020 78 >59 mL/min/1.73 Final    Comment:    **In accordance with recommendations from the NKF-ASN Task force,**   [facility] is in the process of updating its eGFR calculation to the   2021 CKD-EPI creatinine equation that estimates kidney function   without a race variable.    GFR calc non Af Amer  Date Value Ref Range Status  08/01/2020 67 >59 mL/min/1.73 Final   eGFR  Date Value Ref Range Status  08/27/2021 64 >59 mL/min/1.73 Final         Passed - Patient is not pregnant      Passed - Valid encounter within last 12 months    Recent Outpatient Visits           4 weeks ago Acute non-recurrent pansinusitis   [place], Dionne Bucy, MD   3 months ago Congestion of left ear   [place] Tally Joe T, FNP   3 months ago Contusion of left wrist, initial encounter   [facility] Birdie Sons, MD   4 months ago Hoarseness, persistent   [place], Donald E, MD   1 year ago Chronic lumbar radicular pain (Right side)   [place] Birdie Sons, MD               MYRBETRIQ 25 MG TB24 tablet [Pharmacy Med Name: Myrbetriq 25 MG Oral Tablet Extended Release 24 Hour] 100 tablet 2    Sig: TAKE 1 TABLET BY MOUTH DAILY     Urology: Bladder Agents - mirabegron Failed - 09/13/2022 10:44 PM      Failed - Cr in normal  range and within 360 days    Creatinine, Ser  Date Value Ref Range Status  08/27/2021 0.97 0.57 - 1.00 mg/dL Final         Failed - ALT in normal range and within 360 days    ALT  Date Value Ref Range Status  08/27/2021 34 (H) 0 - 32 IU/L Final         Failed - AST in normal range and within 360 days    AST  Date Value Ref Range Status  08/27/2021 31 0 - 40 IU/L Final         Failed - eGFR is 15 or above and within 360 days    GFR calc Af Amer  Date Value Ref Range Status  08/01/2020 78 >59 mL/min/1.73 Final    Comment:    **In accordance with recommendations from the NKF-ASN Task force,**   [facility] is in the process of updating its eGFR calculation to the   2021 CKD-EPI creatinine equation that estimates kidney function   without a race variable.    GFR calc non Af Amer  Date Value Ref Range Status  08/01/2020 67 >59 mL/min/1.73 Final   eGFR  Date Value Ref Range Status  08/27/2021 64 >59 mL/min/1.73 Final         Passed - Last BP in normal range    BP Readings from Last 1 Encounters:  08/18/22 134/80         Passed - Valid encounter within last 12 months    Recent Outpatient Visits           4 weeks ago Acute  non-recurrent pansinusitis   [place], Dionne Bucy, MD   3 months ago Congestion of left ear   [place] Tally Joe T, FNP   3 months ago Contusion of left wrist, initial encounter   [facility] Birdie Sons, MD   4 months ago Hoarseness, persistent   [place], Donald E, MD   1 year ago Chronic lumbar radicular pain (Right side)   [facility] Birdie Sons, MD

## 2022-09-24 ENCOUNTER — Ambulatory Visit: Payer: 59 | Admitting: Dermatology

## 2022-09-29 ENCOUNTER — Other Ambulatory Visit: Payer: 59

## 2022-09-30 ENCOUNTER — Other Ambulatory Visit: Payer: Self-pay | Admitting: Family Medicine

## 2022-09-30 NOTE — Telephone Encounter (Signed)
Medication Refill - Medication: oxyCODONE (ROXICODONE) 15 MG immediate release tablet  cyclobenzaprine (FLEXERIL) 10 MG tablet   Completely out of her current supply   Has the patient contacted their pharmacy? Yes.   (Agent: If no, request that the patient contact the pharmacy for the refill. If patient does not wish to contact the pharmacy document the reason why and proceed with request.) (Agent: If yes, when and what did the pharmacy advise?)  Preferred Pharmacy (with phone number or street name):  [facility] - [place], [place] - [address]  [address] [place] [address]  Phone: [phone] Fax: [phone]   Has the patient been seen for an appointment in the last year OR does the patient have an upcoming appointment? Yes.    Agent: Please be advised that RX refills may take up to 3 business days. We ask that you follow-up with your pharmacy.

## 2022-10-01 MED ORDER — OXYCODONE HCL 15 MG PO TABS: ORAL_TABLET | ORAL | 0 refills | Status: DC

## 2022-10-01 MED ORDER — CYCLOBENZAPRINE HCL 10 MG PO TABS: 10.0000 mg | ORAL_TABLET | Freq: Three times a day (TID) | ORAL | 0 refills | Status: DC | PRN

## 2022-10-01 NOTE — Telephone Encounter (Signed)
Requested medication (s) are due for refill today: yes  Requested medication (s) are on the active medication list: yes  Last refill:  multiple dates  Future visit scheduled: yes  Notes to clinic:  Unable to refill per protocol, cannot delegate.      Requested Prescriptions  Pending Prescriptions Disp Refills   oxyCODONE (ROXICODONE) 15 MG immediate release tablet 180 tablet 0    Sig: TAKE ONE TO TWO TABLETS EVERY FOUR HOURSAS NEEDED FOR PAIN     Not Delegated - Analgesics:  Opioid Agonists Failed - 09/30/2022 10:46 AM      Failed - This refill cannot be delegated      Failed - Urine Drug Screen completed in last 360 days      Passed - Valid encounter within last 3 months    Recent Outpatient Visits           1 month ago Acute non-recurrent pansinusitis   [place] [facility] [place], Marzella Schlein, MD   3 months ago Congestion of left ear   [place] [facility] Merita Norton T, FNP   4 months ago Contusion of left wrist, initial encounter   [facility] Malva Limes, MD   4 months ago Hoarseness, persistent   [place] [facility] Malva Limes, MD   1 year ago Chronic lumbar radicular pain (Right side)   [place] [facility] Malva Limes, MD               cyclobenzaprine (FLEXERIL) 10 MG tablet 270 tablet 0    Sig: Take 1 tablet (10 mg total) by mouth 3 (three) times daily as needed for muscle spasms.     Not Delegated - Analgesics:  Muscle Relaxants Failed - 09/30/2022 10:46 AM      Failed - This refill cannot be delegated      Passed - Valid encounter within last 6 months    Recent Outpatient Visits           1 month ago Acute non-recurrent pansinusitis   [place] [facility] [place], Marzella Schlein, MD   3 months ago Congestion of left ear   [place] [facility] Jacky Kindle, FNP   4 months ago Contusion of left wrist,  initial encounter   [facility] Malva Limes, MD   4 months ago Hoarseness, persistent   [place] [facility] Malva Limes, MD   1 year ago Chronic lumbar radicular pain (Right side)   [facility] Malva Limes, MD

## 2022-10-05 ENCOUNTER — Other Ambulatory Visit: Payer: Self-pay | Admitting: Family Medicine

## 2022-10-05 DIAGNOSIS — E7800 Pure hypercholesterolemia, unspecified: Secondary | ICD-10-CM

## 2022-10-07 ENCOUNTER — Ambulatory Visit
Admission: RE | Admit: 2022-10-07 | Discharge: 2022-10-07 | Disposition: A | Discharge status: Home / Self Care | Payer: 59 | Source: Ambulatory Visit | Attending: Family Medicine | Admitting: Family Medicine

## 2022-10-07 DIAGNOSIS — N63 Unspecified lump in unspecified breast: Secondary | ICD-10-CM | POA: Diagnosis present

## 2022-10-07 DIAGNOSIS — R928 Other abnormal and inconclusive findings on diagnostic imaging of breast: Secondary | ICD-10-CM | POA: Insufficient documentation

## 2022-10-13 ENCOUNTER — Other Ambulatory Visit: Payer: Self-pay | Admitting: Family Medicine

## 2022-10-13 DIAGNOSIS — F419 Anxiety disorder, unspecified: Secondary | ICD-10-CM

## 2022-10-24 ENCOUNTER — Other Ambulatory Visit: Payer: Self-pay | Admitting: Family Medicine

## 2022-10-24 NOTE — Telephone Encounter (Signed)
Medication Refill - Medication: oxyCODONE (ROXICODONE) 15 MG immediate release tablet   Has the patient contacted their pharmacy? No. No, more refills.   (Agent: If no, request that the patient contact the pharmacy for the refill. If patient does not wish to contact the pharmacy document the reason why and proceed with request.)   Preferred Pharmacy (with phone number or street name):  [facility] - , [place] - [address]  [address]  [place] [address]  Phone: [phone] Fax: [phone]  Hours: Not open 24 hours   Has the patient been seen for an appointment in the last year OR does the patient have an upcoming appointment? Yes.    Agent: Please be advised that RX refills may take up to 3 business days. We ask that you follow-up with your pharmacy.  

## 2022-10-24 NOTE — Telephone Encounter (Signed)
Requested medication (s) are due for refill today: yes  Requested medication (s) are on the active medication list: yes  Last refill:  10/01/22  Future visit scheduled: yes  Notes to clinic:  Unable to refill per protocol, cannot delegate.      Requested Prescriptions  Pending Prescriptions Disp Refills   oxyCODONE (ROXICODONE) 15 MG immediate release tablet 180 tablet 0    Sig: TAKE ONE TO TWO TABLETS EVERY FOUR HOURSAS NEEDED FOR PAIN     Not Delegated - Analgesics:  Opioid Agonists Failed - 10/24/2022  9:44 AM      Failed - This refill cannot be delegated      Failed - Urine Drug Screen completed in last 360 days      Passed - Valid encounter within last 3 months    Recent Outpatient Visits           2 months ago Acute non-recurrent pansinusitis   [place] [facility] [place], Marzella Schlein, MD   4 months ago Congestion of left ear   [place] [facility] Merita Norton T, FNP   4 months ago Contusion of left wrist, initial encounter   [facility] Malva Limes, MD   5 months ago Hoarseness, persistent    [facility] Malva Limes, MD   1 year ago Chronic lumbar radicular pain (Right side)   [facility] Malva Limes, MD

## 2022-10-27 NOTE — Telephone Encounter (Signed)
Pt is calling to check and see when her medication will be sent to her pharmacy. Per pt online it looks like her PCP already approved it but when she checked at the pharmacy they are telling her it has not been sent in yet. Please advise.

## 2022-10-30 MED ORDER — OXYCODONE HCL 15 MG PO TABS: ORAL_TABLET | ORAL | 0 refills | Status: DC

## 2022-11-13 NOTE — Progress Notes (Signed)
I,Sulibeya S Dimas,acting as a scribe for Mila Merry, MD.,have documented all relevant documentation on the behalf of Mila Merry, MD,as directed by  Mila Merry, MD while in the presence of Mila Merry, MD.     Established patient visit   Patient: Courtney Ross   DOB: [birth year]   67 y.o. Female  MRN: [id number] Visit Date: 11/14/2022  Today's healthcare provider: Mila Merry, MD   No chief complaint on file.  Subjective    HPI  Patient requesting a bone density test. She reports her insurance went to her house and did an [place] on her arm. And was advised to fu with PCP. Patient reports she fell in February of this year. She reports fracture on her right leg.   Medications: Outpatient Medications Prior to Visit  Medication Sig   aspirin EC 81 MG tablet Take 81 mg by mouth at bedtime.   buPROPion (WELLBUTRIN SR) 150 MG 12 hr tablet TAKE 1 TABLET BY MOUTH 3 TIMES  DAILY   Cholecalciferol (VITAMIN D3) 5000 UNITS CAPS Take 5,000 Units by mouth daily.    Coenzyme Q10 200 MG capsule Take 200 mg by mouth daily.   cyclobenzaprine (FLEXERIL) 10 MG tablet Take 1 tablet (10 mg total) by mouth 3 (three) times daily as needed for muscle spasms.   diazepam (VALIUM) 10 MG tablet TAKE 1 TABLET BY MOUTH EVERY 6 HOURS AS NEEDED FOR ANXIETY.   diclofenac Sodium (VOLTAREN) 1 % GEL SMARTSIG:1 Gram(s) Topical 4 Times Daily PRN   fluticasone (FLONASE) 50 MCG/ACT nasal spray Place 1 spray into both nostrils daily as needed for allergies or rhinitis.   lidocaine (LIDODERM) 5 % Place 1 patch onto the skin daily. Remove & Discard patch within 12 hours or as directed by MD   LINZESS 145 MCG CAPS capsule TAKE 1 CAPSULE BY MOUTH  DAILY   meloxicam (MOBIC) 15 MG tablet TAKE 1 TABLET BY MOUTH AT  BEDTIME AS NEEDED FOR PAIN   mirabegron ER (MYRBETRIQ) 25 MG TB24 tablet TAKE 1 TABLET BY MOUTH DAILY   Multiple Vitamin (MULTIVITAMIN WITH MINERALS) TABS tablet Take 1 tablet by mouth daily.    omeprazole (PRILOSEC) 40 MG capsule Take 1 capsule (40 mg total) by mouth 2 (two) times daily before a meal.   oxyCODONE (ROXICODONE) 15 MG immediate release tablet TAKE ONE TO TWO TABLETS EVERY FOUR HOURSAS NEEDED FOR PAIN   pramipexole (MIRAPEX) 0.25 MG tablet Take 2 tablets (0.5 mg total) by mouth at bedtime.   pregabalin (LYRICA) 150 MG capsule Take 150 mg by mouth 2 (two) times daily.   RESTASIS 0.05 % ophthalmic emulsion Place 1 drop into both eyes 2 (two) times daily.    rosuvastatin (CRESTOR) 10 MG tablet TAKE 1 TABLET BY MOUTH DAILY   sertraline (ZOLOFT) 50 MG tablet TAKE 1 TABLET BY MOUTH DAILY   estradiol (ESTRACE) 1 MG tablet TAKE 1 TABLET BY MOUTH DAILY   No facility-administered medications prior to visit.    Review of Systems  Constitutional:  Negative for chills and fever.  Respiratory:  Negative for cough, chest tightness and shortness of breath.   Cardiovascular:  Positive for leg swelling. Negative for chest pain.       Objective    BP 125/85 (BP Location: Right Arm, Patient Position: Sitting, Cuff Size: Large)   Pulse 90   Temp 97.9 F (36.6 C) (Temporal)   Resp 20   Wt 185 lb 1.6 oz (84 kg)   SpO2 100%  BMI 28.14 kg/m  BP Readings from Last 3 Encounters:  11/14/22 125/85  08/18/22 134/80  08/12/22 122/70   Wt Readings from Last 3 Encounters:  11/14/22 185 lb 1.6 oz (84 kg)  07/16/22 182 lb (82.6 kg)  06/13/22 184 lb 12.8 oz (83.8 kg)     Assessment & Plan     1. Estrogen deficiency  - DG Bone Density; Future      The entirety of the information documented in the History of Present Illness, Review of Systems and Physical Exam were personally obtained by me. Portions of this information were initially documented by the CMA and reviewed by me for thoroughness and accuracy.     Mila Merry, MD  [facility] [phone] (phone) [phone] (fax)  [facility]

## 2022-11-14 ENCOUNTER — Ambulatory Visit (INDEPENDENT_AMBULATORY_CARE_PROVIDER_SITE_OTHER): Payer: 59 | Admitting: Family Medicine

## 2022-11-14 ENCOUNTER — Encounter: Payer: Self-pay | Admitting: Family Medicine

## 2022-11-14 VITALS — BP 125/85 | HR 90 | Temp 97.9°F | Resp 20 | Wt 185.1 lb

## 2022-11-14 DIAGNOSIS — E2839 Other primary ovarian failure: Secondary | ICD-10-CM | POA: Diagnosis not present

## 2022-11-22 ENCOUNTER — Other Ambulatory Visit: Payer: Self-pay | Admitting: Family Medicine

## 2022-11-24 NOTE — Telephone Encounter (Signed)
Requested medication (s) are due for refill today: yes  Requested medication (s) are on the active medication list: yes  Last refill:  09/15/22  Future visit scheduled: no  Notes to clinic:  Unable to refill per protocol due to failed labs, no updated results.      Requested Prescriptions  Pending Prescriptions Disp Refills   meloxicam (MOBIC) 15 MG tablet [Pharmacy Med Name: Meloxicam 15 MG Oral Tablet] 100 tablet 2    Sig: TAKE 1 TABLET BY MOUTH AT  BEDTIME AS NEEDED FOR PAIN     Analgesics:  COX2 Inhibitors Failed - 11/22/2022 10:55 PM      Failed - Manual Review: Labs are only required if the patient has taken medication for more than 8 weeks.      Failed - HGB in normal range and within 360 days    Hemoglobin  Date Value Ref Range Status  08/27/2021 14.3 11.1 - 15.9 g/dL Final         Failed - Cr in normal range and within 360 days    Creatinine, Ser  Date Value Ref Range Status  08/27/2021 0.97 0.57 - 1.00 mg/dL Final         Failed - HCT in normal range and within 360 days    Hematocrit  Date Value Ref Range Status  08/27/2021 43.8 34.0 - 46.6 % Final         Failed - AST in normal range and within 360 days    AST  Date Value Ref Range Status  08/27/2021 31 0 - 40 IU/L Final         Failed - ALT in normal range and within 360 days    ALT  Date Value Ref Range Status  08/27/2021 34 (H) 0 - 32 IU/L Final         Failed - eGFR is 30 or above and within 360 days    GFR calc Af Amer  Date Value Ref Range Status  08/01/2020 78 >59 mL/min/1.73 Final    Comment:    **In accordance with recommendations from the NKF-ASN Task force,**   [facility] is in the process of updating its eGFR calculation to the   2021 CKD-EPI creatinine equation that estimates kidney function   without a race variable.    GFR calc non Af Amer  Date Value Ref Range Status  08/01/2020 67 >59 mL/min/1.73 Final   eGFR  Date Value Ref Range Status  08/27/2021 64 >59 mL/min/1.73 Final          Passed - Patient is not pregnant      Passed - Valid encounter within last 12 months    Recent Outpatient Visits           1 week ago Estrogen deficiency   [place] [facility] Malva Limes, MD   3 months ago Acute non-recurrent pansinusitis   [place] [facility] [place], Marzella Schlein, MD   5 months ago Congestion of left ear   [place] [facility] Jacky Kindle, FNP   5 months ago Contusion of left wrist, initial encounter   [facility] Mal[facility], [facility]   6 months ago Hoarseness, persistent   [place] [facility] Malva Limes, MD               MYRBETRIQ 25 MG TB24 tablet [Pharmacy Med Name: Myrbetriq 25 MG Oral Tablet Extended Release 24 Hour] 100 tablet  2    Sig: TAKE 1 TABLET BY MOUTH DAILY     Urology: Bladder Agents - mirabegron Failed - 11/22/2022 10:55 PM      Failed - Cr in normal range and within 360 days    Creatinine, Ser  Date Value Ref Range Status  08/27/2021 0.97 0.57 - 1.00 mg/dL Final         Failed - ALT in normal range and within 360 days    ALT  Date Value Ref Range Status  08/27/2021 34 (H) 0 - 32 IU/L Final         Failed - AST in normal range and within 360 days    AST  Date Value Ref Range Status  08/27/2021 31 0 - 40 IU/L Final         Failed - eGFR is 15 or above and within 360 days    GFR calc Af Amer  Date Value Ref Range Status  08/01/2020 78 >59 mL/min/1.73 Final    Comment:    **In accordance with recommendations from the NKF-ASN Task force,**   [facility] is in the process of updating its eGFR calculation to the   2021 CKD-EPI creatinine equation that estimates kidney function   without a race variable.    GFR calc non Af Amer  Date Value Ref Range Status  08/01/2020 67 >59 mL/min/1.73 Final   eGFR  Date Value Ref Range Status  08/27/2021 64 >59 mL/min/1.73 Final         Passed - Last BP in normal range     BP Readings from Last 1 Encounters:  11/14/22 125/85         Passed - Valid encounter within last 12 months    Recent Outpatient Visits           1 week ago Estrogen deficiency   [facility], [facility]   3 months ago Acute non-recurrent pansinusitis   [place] [place], Marzella Schlein, MD   5 months ago Congestion of left ear   [facility] Jacky Kindle, FNP   5 months ago Contusion of left wrist, initial encounter   [facility] Malva Limes, MD   6 months ago Hoarseness, persistent   [facility] Malva Limes, MD

## 2022-11-28 ENCOUNTER — Other Ambulatory Visit: Payer: Self-pay | Admitting: Family Medicine

## 2022-11-28 NOTE — Telephone Encounter (Signed)
Requested medications are due for refill today.  yes  Requested medications are on the active medications list.  yes  Last refill. 10/30/2022 #180 0 rf  Future visit scheduled.   no  Notes to clinic.  Refill not delegated.    Requested Prescriptions  Pending Prescriptions Disp Refills   oxyCODONE (ROXICODONE) 15 MG immediate release tablet 180 tablet 0    Sig: TAKE ONE TO TWO TABLETS EVERY FOUR HOURSAS NEEDED FOR PAIN     Not Delegated - Analgesics:  Opioid Agonists Failed - 11/28/2022  1:51 PM      Failed - This refill cannot be delegated      Failed - Urine Drug Screen completed in last 360 days      Passed - Valid encounter within last 3 months    Recent Outpatient Visits           2 weeks ago Estrogen deficiency   [place] [facility], [facility]   3 months ago Acute non-recurrent pansinusitis   [facility] [place], Marzella Schlein, MD   5 months ago Congestion of left ear   [facility] Merita Norton T, FNP   5 months ago Contusion of left wrist, initial encounter   [facility]   6 months ago Hoarseness, persistent   [facility] Malva Limes, MD

## 2022-11-28 NOTE — Telephone Encounter (Signed)
Medication Refill - Medication: oxyCODONE (ROXICODONE) 15 MG immediate release tablet Pt states that she is out of medication and to please let her PCP know that her last name has changed to [place] since she got married. Her maiden name use to be [place].   Has the patient contacted their pharmacy? No. Preferred Pharmacy (with phone number or street name): [facility] - [place], [place] - [address]  Phone: [phone] Fax: [phone]  Has the patient been seen for an appointment in the last year OR does the patient have an upcoming appointment? Yes.    Agent: Please be advised that RX refills may take up to 3 business days. We ask that you follow-up with your pharmacy.

## 2022-11-29 MED ORDER — OXYCODONE HCL 15 MG PO TABS: ORAL_TABLET | ORAL | 0 refills | Status: DC

## 2022-12-30 ENCOUNTER — Other Ambulatory Visit: Payer: Self-pay | Admitting: Family Medicine

## 2022-12-30 MED ORDER — OXYCODONE HCL 15 MG PO TABS: ORAL_TABLET | ORAL | 0 refills | Status: DC

## 2022-12-30 NOTE — Telephone Encounter (Signed)
Medication Refill - Medication: oxyCODONE (ROXICODONE) 15 MG immediate release tablet   Has the patient contacted their pharmacy? No. No, more refills.   (Agent: If no, request that the patient contact the pharmacy for the refill. If patient does not wish to contact the pharmacy document the reason why and proceed with request.)   Preferred Pharmacy (with phone number or street name):  [facility] - [place] - [address]  [address] [place] [address]  Phone: [phone] Fax: [phone]  Hours: Not open 24 hours   Has the patient been seen for an appointment in the last year OR does the patient have an upcoming appointment? Yes.    Agent: Please be advised that RX refills may take up to 3 business days. We ask that you follow-up with your pharmacy.  

## 2022-12-30 NOTE — Telephone Encounter (Signed)
Requested medication (s) are due for refill today: yes  Requested medication (s) are on the active medication list: yes  Last refill:  11/29/22  Future visit scheduled: no  Notes to clinic:  Unable to refill per protocol, cannot delegate.      Requested Prescriptions  Pending Prescriptions Disp Refills   oxyCODONE (ROXICODONE) 15 MG immediate release tablet 180 tablet 0    Sig: TAKE ONE TO TWO TABLETS EVERY FOUR HOURSAS NEEDED FOR PAIN     Not Delegated - Analgesics:  Opioid Agonists Failed - 12/30/2022 10:16 AM      Failed - This refill cannot be delegated      Failed - Urine Drug Screen completed in last 360 days      Passed - Valid encounter within last 3 months    Recent Outpatient Visits           1 month ago Estrogen deficiency   [place] Mal[facility], MD   4 months ago Acute non-recurrent pansinusitis   [facility] [place], Marzella Schlein, MD   6 months ago Congestion of left ear   [facility] Merita Norton T, FNP   7 months ago Contusion of left wrist, initial encounter   [facility] Mal[facility], MD   7 months ago Hoarseness, persistent   [facility] Sherrie Mustache, Demetrios Isaacs, MD

## 2023-01-27 ENCOUNTER — Other Ambulatory Visit: Payer: Self-pay | Admitting: Family Medicine

## 2023-01-27 DIAGNOSIS — M5137 Other intervertebral disc degeneration, lumbosacral region: Secondary | ICD-10-CM

## 2023-01-27 NOTE — Telephone Encounter (Signed)
Medication Refill - Medication:  oxycodone 15 mg Lidocaine patches   Has the patient contacted their pharmacy? Yes.   (Agent: If no, request that the patient contact the pharmacy for the refill. If patient does not wish to contact the pharmacy document the reason why and proceed with request.) (Agent: If yes, when and what did the pharmacy advise?)  Preferred Pharmacy (with phone number or street name):  [facility] Has the patient been seen for an appointment in the last year OR does the patient have an upcoming appointment? Yes.    Agent: Please be advised that RX refills may take up to 3 business days. We ask that you follow-up with your pharmacy.

## 2023-01-28 MED ORDER — LIDOCAINE 5 % EX PTCH
1.0000 | MEDICATED_PATCH | CUTANEOUS | 3 refills | Status: DC
Start: 2023-01-28 — End: 2023-03-17

## 2023-01-28 MED ORDER — OXYCODONE HCL 15 MG PO TABS: ORAL_TABLET | ORAL | 0 refills | Status: DC

## 2023-01-28 NOTE — Telephone Encounter (Signed)
Requested medication (s) are due for refill today:yes   Requested medication (s) are on the active medication list: yes  Last refill:  oxycodone: 12/30/22 #180           lidocaine 04/2022 #90   Future visit scheduled:no  Notes to clinic:  oxy: not delegated to NT to RF Lidocaine: overdue lab work    Requested Prescriptions  Pending Prescriptions Disp Refills   oxyCODONE (ROXICODONE) 15 MG immediate release tablet 180 tablet 0    Sig: TAKE ONE TO TWO TABLETS EVERY FOUR HOURSAS NEEDED FOR PAIN     Not Delegated - Analgesics:  Opioid Agonists Failed - 01/27/2023 11:32 AM      Failed - This refill cannot be delegated      Failed - Urine Drug Screen completed in last 360 days      Passed - Valid encounter within last 3 months    Recent Outpatient Visits           2 months ago Estrogen deficiency   [facility] Mal[facility], MD   5 months ago Acute non-recurrent pansinusitis   [facility] [place], Marzella Schlein, MD   7 months ago Congestion of left ear   [facility] Merita Norton T, FNP   8 months ago Contusion of left wrist, initial encounter   [facility] Mal[facility], MD   8 months ago Hoarseness, persistent   [place] [facility] Malva Limes, MD               lidocaine (LIDODERM) 5 % 90 patch 1    Sig: Place 1 patch onto the skin daily. Remove & Discard patch within 12 hours or as directed by MD     Analgesics:  Topicals Failed - 01/27/2023 11:32 AM      Failed - Manual Review: Labs are only required if the patient has taken medication for more than 8 weeks.      Failed - PLT in normal range and within 360 days    Platelets  Date Value Ref Range Status  08/27/2021 287 150 - 450 x10E3/uL Final         Failed - HGB in normal range and within 360 days    Hemoglobin  Date Value Ref Range Status  08/27/2021 14.3 11.1 - 15.9 g/dL Final          Failed - HCT in normal range and within 360 days    Hematocrit  Date Value Ref Range Status  08/27/2021 43.8 34.0 - 46.6 % Final         Failed - Cr in normal range and within 360 days    Creatinine, Ser  Date Value Ref Range Status  08/27/2021 0.97 0.57 - 1.00 mg/dL Final         Failed - eGFR is 30 or above and within 360 days    GFR calc Af Amer  Date Value Ref Range Status  08/01/2020 78 >59 mL/min/1.73 Final    Comment:    **In accordance with recommendations from the NKF-ASN Task force,**   [facility] is in the process of updating its eGFR calculation to the   2021 CKD-EPI creatinine equation that estimates kidney function   without a race variable.    GFR calc non Af Amer  Date Value Ref Range Status  08/01/2020 67 >59 mL/min/1.73 Final   eGFR  Date Value Ref Range  Status  08/27/2021 64 >59 mL/min/1.73 Final         Passed - Patient is not pregnant      Passed - Valid encounter within last 12 months    Recent Outpatient Visits           2 months ago Estrogen deficiency   [facility] Mal[facility] L[facility], MD   5 months ago Acute non-recurrent pansinusitis   [place] [place], Marzella Schlein, MD   7 months ago Congestion of left ear   [facility] Jacky Kindle, FNP   8 months ago Contusion of left wrist, initial encounter   [facility], MD   8 months ago Hoarseness, persistent   [facility] Malva Limes, MD

## 2023-02-02 ENCOUNTER — Ambulatory Visit
Admission: RE | Admit: 2023-02-02 | Discharge: 2023-02-02 | Disposition: A | Discharge status: Home / Self Care | Payer: 59 | Source: Ambulatory Visit | Attending: Family Medicine | Admitting: Family Medicine

## 2023-02-02 DIAGNOSIS — E2839 Other primary ovarian failure: Secondary | ICD-10-CM | POA: Insufficient documentation

## 2023-02-24 ENCOUNTER — Other Ambulatory Visit: Payer: Self-pay | Admitting: Gastroenterology

## 2023-02-24 ENCOUNTER — Other Ambulatory Visit: Payer: Self-pay | Admitting: Family Medicine

## 2023-02-24 DIAGNOSIS — K219 Gastro-esophageal reflux disease without esophagitis: Secondary | ICD-10-CM

## 2023-02-24 NOTE — Telephone Encounter (Signed)
Medication Refill - Medication: oxyCODONE (ROXICODONE) 15 MG immediate release tablet   Has the patient contacted their pharmacy? No because of this type of med   Preferred Pharmacy (with phone number or street name):  [facility] - [place], [place] - [address] Phone: [phone]  Fax: [phone]      Has the patient been seen for an appointment in the last year immediate release tablet R does the patient have an upcoming appointment? yes  Agent: Please be advised that RX refills may take up to 3 business days. We ask that you follow-up with your pharmacy.

## 2023-02-26 MED ORDER — OXYCODONE HCL 15 MG PO TABS: ORAL_TABLET | ORAL | 0 refills | Status: DC

## 2023-02-26 NOTE — Telephone Encounter (Signed)
Requested medications are due for refill today.  yes  Requested medications are on the active medications list.  yes  Last refill. 01/28/2023 #180 0 rf  Future visit scheduled.   no  Notes to clinic.  Refill not delegated.    Requested Prescriptions  Pending Prescriptions Disp Refills   oxyCODONE (ROXICODONE) 15 MG immediate release tablet 180 tablet 0    Sig: TAKE ONE TO TWO TABLETS EVERY FOUR HOURSAS NEEDED FOR PAIN     Not Delegated - Analgesics:  Opioid Agonists Failed - 02/24/2023  1:14 PM      Failed - This refill cannot be delegated      Failed - Urine Drug Screen completed in last 360 days      Failed - Valid encounter within last 3 months    Recent Outpatient Visits           3 months ago Estrogen deficiency   [place] [facility] Mal[facility], MD   6 months ago Acute non-recurrent pansinusitis   [place] [facility] [place], Marzella Schlein, MD   8 months ago Congestion of left ear   [facility] Merita Norton T, FNP   8 months ago Contusion of left wrist, initial encounter   [facility] Mal[facility] L[facility], MD   9 months ago Hoarseness, persistent   [facility] Sherrie Mustache, Demetrios Isaacs, MD       Future Appointments             In 1 month Vanga, Loel Dubonnet, MD [facility] [place] [facility] at [facility]

## 2023-03-03 ENCOUNTER — Telehealth: Payer: Self-pay | Admitting: Family Medicine

## 2023-03-03 DIAGNOSIS — E7800 Pure hypercholesterolemia, unspecified: Secondary | ICD-10-CM

## 2023-03-03 NOTE — Telephone Encounter (Addendum)
Medication Refill - Medication: rosuvastatin (CRESTOR) 10 MG tablet   Pharmacy calling to request refill.   Has the patient contacted their pharmacy? Yes.    (Agent: If yes, when and what did the pharmacy advise?)  Preferred Pharmacy (with phone number or street name):  [facility] [place] - [address]  [address] [place] [address]  Phone: [phone] Fax: [phone]  Hours: Not open 24 hours   Has the patient been seen for an appointment in the last year OR does the patient have an upcoming appointment? Yes.    Agent: Please be advised that RX refills may take up to 3 business days. We ask that you follow-up with your pharmacy.

## 2023-03-09 ENCOUNTER — Other Ambulatory Visit: Payer: Self-pay | Admitting: Family Medicine

## 2023-03-09 DIAGNOSIS — E7800 Pure hypercholesterolemia, unspecified: Secondary | ICD-10-CM

## 2023-03-11 ENCOUNTER — Other Ambulatory Visit: Payer: Self-pay | Admitting: Family Medicine

## 2023-03-11 DIAGNOSIS — M5137 Other intervertebral disc degeneration, lumbosacral region: Secondary | ICD-10-CM

## 2023-03-11 DIAGNOSIS — F419 Anxiety disorder, unspecified: Secondary | ICD-10-CM

## 2023-03-19 ENCOUNTER — Other Ambulatory Visit: Payer: Self-pay | Admitting: Family Medicine

## 2023-03-19 DIAGNOSIS — G2581 Restless legs syndrome: Secondary | ICD-10-CM

## 2023-03-19 DIAGNOSIS — E7800 Pure hypercholesterolemia, unspecified: Secondary | ICD-10-CM

## 2023-03-19 NOTE — Telephone Encounter (Signed)
Medication Refill - Medication: rosuvastatin (CRESTOR) 10 MG tablet, pregabalin (LYRICA) 150 MG capsule, and pramipexole (MIRAPEX) 0.25 MG tablet   Has the patient contacted their pharmacy? Yes.    Preferred Pharmacy (with phone number or street name):  [facility] - [place], [place] - [address] Phone: [phone]  Fax: [phone]     Has the patient been seen for an appointment in the last year OR does the patient have an upcoming appointment? Yes.    Agent: Please be advised that RX refills may take up to 3 business days. We ask that you follow-up with your pharmacy.

## 2023-03-20 NOTE — Telephone Encounter (Signed)
Requested medication (s) are due for refill today:   Provider to review  Requested medication (s) are on the active medication list:   Yes for 2 but the Lyrica is a historical medication.  Future visit scheduled:   No    Seen 4 mo. ago   Last ordered: Crestor 03/09/2023 #90, 0 refills;   Mirapex 04/01/2022 #180, 4 refills;   Lyrica 02/07/2021 historical  Non delegated refill and labs are due per protocol   Requested Prescriptions  Pending Prescriptions Disp Refills   rosuvastatin (CRESTOR) 10 MG tablet 90 tablet 0    Sig: Take 1 tablet (10 mg total) by mouth daily.     Cardiovascular:  Antilipid - Statins 2 Failed - 03/19/2023  1:30 PM      Failed - Cr in normal range and within 360 days    Creatinine, Ser  Date Value Ref Range Status  08/27/2021 0.97 0.57 - 1.00 mg/dL Final         Failed - Lipid Panel in normal range within the last 12 months    Cholesterol, Total  Date Value Ref Range Status  08/27/2021 230 (H) 100 - 199 mg/dL Final   LDL Chol Calc (NIH)  Date Value Ref Range Status  08/27/2021 143 (H) 0 - 99 mg/dL Final   HDL  Date Value Ref Range Status  08/27/2021 72 >39 mg/dL Final   Triglycerides  Date Value Ref Range Status  08/27/2021 84 0 - 149 mg/dL Final         Passed - Patient is not pregnant      Passed - Valid encounter within last 12 months    Recent Outpatient Visits           4 months ago Estrogen deficiency   [facility], [facility]   7 months ago Acute non-recurrent pansinusitis   [place] [facility] [place] [place], Marzella Schlein, MD   9 months ago Congestion of left ear   [facility] Merita Norton T, FNP   9 months ago Contusion of left wrist, initial encounter   [facility] Mal[facility] L[facility], MD   10 months ago Hoarseness, persistent   [facility] Mal[facility] Limes, MD       Future Appointments             In  1 month Vanga, Loel Dubonnet, MD [facility] [place] [facility]             pramipexole (MIRAPEX) 0.25 MG tablet 180 tablet 4    Sig: Take 2 tablets (0.5 mg total) by mouth at bedtime.     Neurology:  Parkinsonian Agents Passed - 03/19/2023  1:30 PM      Passed - Last BP in normal range    BP Readings from Last 1 Encounters:  11/14/22 125/85         Passed - Last Heart Rate in normal range    Pulse Readings from Last 1 Encounters:  11/14/22 90         Passed - Valid encounter within last 12 months    Recent Outpatient Visits           4 months ago Estrogen deficiency   [facility]   7 months ago Acute non-recurrent pansinusitis   [facility] [place], Marzella Schlein, MD   9 months ago Congestion  of left ear   [facility] Merita Norton T, FNP   9 months ago Contusion of left wrist, initial encounter   [facility] Malva Limes, MD   10 months ago Hoarseness, persistent   [facility] Mal[facility] Limes, MD       Future Appointments             In 1 month Vanga, Loel Dubonnet, MD [facility] [place] [facility]             pregabalin [facility]) 150 MG capsule      Sig: Take 1 capsule (150 mg total) by mouth 2 (two) times daily.     Not Delegated - Neurology:  Anticonvulsants - Controlled - pregabalin Failed - 03/19/2023  1:30 PM      Failed - This refill cannot be delegated      Failed - Cr in normal range and within 360 days    Creatinine, Ser  Date Value Ref Range Status  08/27/2021 0.97 0.57 - 1.00 mg/dL Final         Passed - Completed PHQ-2 or PHQ-9 in the last 360 days      Passed - Valid encounter within last 12 months    Recent Outpatient Visits           4 months ago Estrogen deficiency   [facility] [place] Malva Limes, MD   7 months  ago Acute non-recurrent pansinusitis   [facility] [place], Marzella Schlein, MD   9 months ago Congestion of left ear   [facility] Merita Norton T, FNP   9 months ago Contusion of left wrist, initial encounter   [facility] Malva [facility], MD   10 months ago Hoarseness, persistent   [place] [facility] Sherrie Mustache, Demetrios Isaacs, MD       Future Appointments             In 1 month Vanga, Loel Dubonnet, MD [facility] at [facility]

## 2023-03-23 MED ORDER — PREGABALIN 150 MG PO CAPS: 150.0000 mg | ORAL_CAPSULE | Freq: Two times a day (BID) | ORAL | 1 refills | Status: DC

## 2023-03-23 MED ORDER — PRAMIPEXOLE DIHYDROCHLORIDE 0.25 MG PO TABS: 0.5000 mg | ORAL_TABLET | Freq: Every day | ORAL | 4 refills | Status: DC

## 2023-03-23 MED ORDER — ROSUVASTATIN CALCIUM 10 MG PO TABS: 10.0000 mg | ORAL_TABLET | Freq: Every day | ORAL | 4 refills | Status: DC

## 2023-03-26 ENCOUNTER — Telehealth: Payer: Self-pay | Admitting: Family Medicine

## 2023-03-26 NOTE — Telephone Encounter (Signed)
Medication Refill - Medication: Oxycodone 15 mg  Has the patient contacted their pharmacy? Yes.   (Agent: If no, request that the patient contact the pharmacy for the refill. If patient does not wish to contact the pharmacy document the reason why and proceed with request.) (Agent: If yes, when and what did the pharmacy advise?)  Preferred Pharmacy (with phone number or street name): [facility] Has the patient been seen for an appointment in the last year OR does the patient have an upcoming appointment? Yes.    Agent: Please be advised that RX refills may take up to 3 business days. We ask that you follow-up with your pharmacy.

## 2023-03-27 MED ORDER — OXYCODONE HCL 15 MG PO TABS: 15.0000 mg | ORAL_TABLET | ORAL | 0 refills | Status: DC | PRN

## 2023-03-30 ENCOUNTER — Other Ambulatory Visit: Payer: Self-pay | Admitting: Family Medicine

## 2023-03-30 DIAGNOSIS — G2581 Restless legs syndrome: Secondary | ICD-10-CM

## 2023-03-30 NOTE — Telephone Encounter (Signed)
Medication Refill - Medication: pramipexole (MIRAPEX) 0.25 MG tablet   Pt is requesting medication transferred from [facility]. Please advise.   Has the patient contacted their pharmacy? Yes.    (Agent: If yes, when and what did the pharmacy advise?)  Preferred Pharmacy (with phone number or street name):  [facility] - [place], [place] - [address]  [address] [place] [address]  Phone: [phone] Fax: [phone]  Hours: Not open 24 hours   Has the patient been seen for an appointment in the last year OR does the patient have an upcoming appointment? Yes.    Agent: Please be advised that RX refills may take up to 3 business days. We ask that you follow-up with your pharmacy.

## 2023-03-30 NOTE — Telephone Encounter (Signed)
Transfer pramipexole (MIRAPEX) 0.25 MG tablet to [facility].

## 2023-03-31 MED ORDER — PRAMIPEXOLE DIHYDROCHLORIDE 0.25 MG PO TABS
0.5000 mg | ORAL_TABLET | Freq: Every day | ORAL | 2 refills | Status: DC
Start: 2023-03-31 — End: 2024-05-02

## 2023-03-31 NOTE — Telephone Encounter (Signed)
Requested Prescriptions  Pending Prescriptions Disp Refills   pramipexole (MIRAPEX) 0.25 MG tablet 180 tablet 2    Sig: Take 2 tablets (0.5 mg total) by mouth at bedtime.     Neurology:  Parkinsonian Agents Passed - 03/30/2023  2:17 PM      Passed - Last BP in normal range    BP Readings from Last 1 Encounters:  11/14/22 125/85         Passed - Last Heart Rate in normal range    Pulse Readings from Last 1 Encounters:  11/14/22 90         Passed - Valid encounter within last 12 months    Recent Outpatient Visits           4 months ago Estrogen deficiency   [facility] Malva Limes, MD   7 months ago Acute non-recurrent pansinusitis   [facility] [place], Marzella Schlein, MD   9 months ago Congestion of left ear   [facility] Jacky Kindle, FNP   10 months ago Contusion of left wrist, initial encounter   [facility] Malva Limes, MD   11 months ago Hoarseness, persistent   [facility] Sherrie Mustache, Demetrios Isaacs, MD       Future Appointments             In 2 weeks Vanga, Loel Dubonnet, MD [facility]

## 2023-04-20 ENCOUNTER — Ambulatory Visit (INDEPENDENT_AMBULATORY_CARE_PROVIDER_SITE_OTHER): Payer: 59 | Admitting: Gastroenterology

## 2023-04-20 ENCOUNTER — Other Ambulatory Visit: Payer: Self-pay

## 2023-04-20 ENCOUNTER — Encounter: Payer: Self-pay | Admitting: Gastroenterology

## 2023-04-20 VITALS — BP 144/84 | HR 85 | Temp 98.4°F | Ht 68.0 in | Wt 189.1 lb

## 2023-04-20 DIAGNOSIS — K5909 Other constipation: Secondary | ICD-10-CM

## 2023-04-20 DIAGNOSIS — K219 Gastro-esophageal reflux disease without esophagitis: Secondary | ICD-10-CM

## 2023-04-20 DIAGNOSIS — Z860101 Personal history of adenomatous and serrated colon polyps: Secondary | ICD-10-CM

## 2023-04-20 DIAGNOSIS — R7989 Other specified abnormal findings of blood chemistry: Secondary | ICD-10-CM

## 2023-04-20 DIAGNOSIS — Z860100 Personal history of colon polyps, unspecified: Secondary | ICD-10-CM

## 2023-04-20 MED ORDER — SUTAB 1479-225-188 MG PO TABS: 12.0000 | ORAL_TABLET | Freq: Once | ORAL | 0 refills | Status: AC

## 2023-04-20 NOTE — Progress Notes (Signed)
Courtney Repress, MD [address]  [address]  [place], [place] [address]  Main: [phone]  Fax: [phone]    Gastroenterology Consultation  Referring Provider:     Malva Limes, MD Primary Care Physician:  Malva Limes, MD Primary Gastroenterologist:  Dr. Tobi Bastos Reason for Consultation: Chronic constipation, chronic GERD        HPI:   Courtney Ross is a 67 y.o. female referred by Dr. Malva Limes, MD  for consultation & management of dysphagia, chronic GERD.  Patient has history of chronic GERD for which she takes omeprazole 40 mg daily.  She reports that for more than a month, she has been experiencing hoarseness of voice, upper respiratory infection has been ruled out.  She does report worsening of nocturnal heartburn as well as worsening of reflux when bending over.  Dr. Sherrie Mustache added Pepcid daily along with omeprazole.  She also reports food getting stuck in her throat, has been chewing well due to fear of choking episodes.  She does eat desserts on a regular basis.  She has been gaining weight due to decreased physical activity from back issues.  She used to walk along with her daughter until recently.  Follow-up visit 04/20/2023 Courtney Ross is here for follow-up of chronic constipation and chronic GERD.  She reports that Linzess 145 mcg is not working for her.  She does admit to consumption of pasta, bread on a regular basis which her daughter makes.  Her chronic GERD, she takes omeprazole 40 mg twice daily  NSAIDs: None  Antiplts/Anticoagulants/Anti thrombotics: None  GI Procedures:  Upper endoscopy for epigastric pain, dysphagia, GERD - Scalloped mucosa was found in the duodenum, rule out celiac disease. Biopsied. - Normal duodenal bulb. - Scar in the gastric body ( greater curvature) . Biopsied. - Normal gastric body, incisura and antrum. Biopsied. - Small hiatal hernia. - LA Grade A reflux esophagitis with no bleeding. Biopsied. - Normal  esophagus. DIAGNOSIS:  A. DUODENUM; COLD BIOPSY:  - REACTIVE DUODENITIS.  - NEGATIVE FOR FEATURES OF CELIAC, DYSPLASIA, AND MALIGNANCY.   B.  STOMACH, BODY; COLD BIOPSY:  - ANTRAL MUCOSA WITH CHANGES CONSISTENT WITH HEALING EROSIVE GASTRITIS.  - NEGATIVE FOR H. PYLORI, INTESTINAL METAPLASIA, DYSPLASIA, AND  MALIGNANCY.   C. STOMACH, RANDOM; COLD BIOPSY:  - ANTRAL MUCOSA WITH REACTIVE GASTRITIS.  - UNREMARKABLE OXYNTIC MUCOSA.  - NEGATIVE FOR H. PYLORI, INTESTINAL METAPLASIA, DYSPLASIA, AND  MALIGNANCY.   D. GEJ; COLD BIOPSY:  - REFLUX ESOPHAGITIS.  - NEGATIVE FOR DYSPLASIA AND MALIGNANCY.   Colonoscopy 2021 Tubular adenoma of the colon removed  Past Medical History:  Diagnosis Date   Anxiety    Atypical squamous cells of undetermined significance on cytologic smear of vagina (ASC-US) 11/22/2014   Back pain    Colon polyp    Constipation    Degenerative disc disease, lumbar    Depression    Family history of adverse reaction to anesthesia    daughter nausea   Fatty liver    Fatty liver disease, nonalcoholic    GERD (gastroesophageal reflux disease)    H/O spinal fusion    Headache    migraines   Headache, migraine 05/08/2015   Hepatitis    in the 1970s.  Resolved.   High cholesterol    History of frequent ear infections in childhood 11/22/2014   Overview:   DID have Chicken Pox. DID have Measles. DID have Mumps. DID have Scarlet Fever.  Formatting of this note might be different from the original.  DID have Chicken Pox. DID have Measles. DID have Mumps. DID have Scarlet Fever.   Hypertension    Lower back pain    Polycystic ovarian disease    PONV (postoperative nausea and vomiting)    due to surgery from the 1990's   Restless leg syndrome    Spasm 01/29/2009   Spinal cord stimulator status    Wears dentures    full upper, partial lower    Past Surgical History:  Procedure Laterality Date   APPENDECTOMY  1988   ARTHRODESIS METATARSALPHALANGEAL JOINT  (MTPJ) Left 12/14/2014   Procedure: ARTHRODESIS METATARSALPHALANGEAL JOINT (MTPJ) 1ST;  Surgeon: Recardo Evangelist, DPM;  Location: [facility];  Service: Podiatry;  Laterality: Left;  LMA   back fusion  2004   lumbar region. had broken facet joint.    BACK SURGERY  [phone]   x3   BLADDER SUSPENSION  2006   BREAST BIOPSY Left 1990's   core Sankar   BREAST SURGERY  2005   lumpectomy   CARPAL TUNNEL RELEASE Bilateral 02/26/07,05/21/07   CATARACT EXTRACTION W/PHACO Right 02/11/2021   Procedure: CATARACT EXTRACTION PHACO AND INTRAOCULAR LENS PLACEMENT (IOC) RIGHT PANOPTIX LENS;  Surgeon: Nevada Crane, MD;  Location: [facility];  Service: Ophthalmology;  Laterality: Right;  PANOPTIX LENS 1.92 00:17.8   CATARACT EXTRACTION W/PHACO Left 03/04/2021   Procedure: CATARACT EXTRACTION PHACO AND INTRAOCULAR  LENS PLACEMENT (IOC) LEFT PANOPTIX LENS 2.87 00:30.0;  Surgeon: Nevada Crane, MD;  Location: [facility];  Service: Ophthalmology;  Laterality: Left;  PANOPTIX LENS   CHOLECYSTECTOMY N/A 05/03/2018   Procedure: LAPAROSCOPIC CHOLECYSTECTOMY WITH INTRAOPERATIVE CHOLANGIOGRAM;  Surgeon: Earline Mayotte, MD;  Location: [facility] ORS;  Service: General;  Laterality: N/A;   COLONOSCOPY     COLONOSCOPY WITH PROPOFOL N/A 01/10/2020   Procedure: COLONOSCOPY WITH PROPOFOL;  Surgeon: Wyline Mood, MD;  Location[facility];  Service: Gastroenterology;  Laterality: N/A;   COLPORRHAPHY     DILATION AND CURETTAGE OF UTERUS     ECTOPIC PREGNANCY SURGERY  1982   ESOPHAGOGASTRODUODENOSCOPY (EGD) WITH PROPOFOL N/A 05/08/2022   Procedure: ESOPHAGOGASTRODUODENOSCOPY (EGD) WITH BIOPSY;  Surgeon: Toney Reil, MD;  Location: [facility];  Service: Endoscopy;  Laterality: N/A;   FOOT SURGERY Left 2017   fusion on big toe, hammer toe, bunions   HAMMER TOE SURGERY Left 03/22/2015   Procedure: HAMMER TOE CORRECTION L 2ND AND 3RD ;  Surgeon: Recardo Evangelist, DPM;   Location: [facility];  Service: Podiatry;  Laterality: Left;  WITH LOCAL   HERNIA REPAIR Right 1988   inguinal   LAMINECTOMY  1993, 1994   NECK SURGERY  2006   fusion   RECTOCELE REPAIR N/A 08/25/2016   Procedure: POSTERIOR REPAIR (RECTOCELE);  Surgeon: Herold Harms, MD;  Location: [facility] ORS;  Service: Gynecology;  Laterality: N/A;   SPINAL CORD STIMULATOR BATTERY EXCHANGE Right 04/08/2019   Procedure: SPINAL CORD STIMULATOR BATTERY EXCHANGE;  Surgeon: Odette Fraction, MD;  Location: [facility] OR;  Service: Neurosurgery;  Laterality: Right;  SPINAL CORD STIMULATOR BATTERY EXCHANGE   SPINAL CORD STIMULATOR IMPLANT  2014   stimulator in the ileo inguinal nerve due to scar tissue in abdomen   TONSILLECTOMY  1985   [place]OTAL [place]BDOM[place]CT[place]   VENTRAL HERNIA REPAIR N/A 09/01/2018   6.4 cm Ventra lite mesh. HERNIA REPAIR VENTRAL ADULT;  Surgeon: Earline Mayotte, MD;  Location:  [facility] ORS;  Service: General;  Laterality: N/A;     Current Outpatient Medications:    aspirin EC 81 MG tablet, Take 81 mg by mouth at bedtime., Disp: , Rfl:    buPROPion (WELLBUTRIN SR) 150 MG 12 hr tablet, TAKE 1 TABLET BY MOUTH 3 TIMES  DAILY, Disp: 300 tablet, Rfl: 3   Cholecalciferol (VITAMIN D3) 5000 UNITS CAPS, Take 5,000 Units by mouth daily. , Disp: , Rfl:    Coenzyme Q10 200 MG capsule, Take 200 mg by mouth daily., Disp: , Rfl:    cyclobenzaprine (FLEXERIL) 10 MG tablet, Take 1 tablet (10 mg total) by mouth 3 (three) times daily as needed for muscle spasms., Disp: 270 tablet, Rfl: 0   diazepam (VALIUM) 10 MG tablet, TAKE 1 TABLET BY MOUTH EVERY 6 HOURS AS NEEDED FOR ANXIETY., Disp: 120 tablet, Rfl: 0   diclofenac Sodium (VOLTAREN) 1 % GEL, SMARTSIG:1 Gram(s) Topical 4 Times Daily PRN, Disp: 100 g, Rfl: 4   fluticasone (FLONASE) 50 MCG/ACT nasal spray, Place 1 spray into both nostrils daily as needed for allergies or rhinitis., Disp: , Rfl:    lidocaine (LIDODERM) 5 %, APPLY 1 PATCH TOPICALLY  TO SKIN  DAILY MAY BE LEFT ON UP TO 12  HOURS WITHIN A 24 HOUR PERIOD AS DIRECTED BY MD, Disp: 90 patch, Rfl: 4   LINZESS 145 MCG CAPS capsule, TAKE 1 CAPSULE BY MOUTH DAILY, Disp: 90 capsule, Rfl: 4   meloxicam (MOBIC) 15 MG tablet, TAKE 1 TABLET BY MOUTH AT  BEDTIME AS NEEDED FOR PAIN, Disp: 100 tablet, Rfl: 2   Multiple Vitamin (MULTIVITAMIN WITH MINERALS) TABS tablet, Take 1 tablet by mouth daily., Disp: , Rfl:    MYRBETRIQ 25 MG TB24 tablet, TAKE 1 TABLET BY MOUTH DAILY, Disp: 100 tablet, Rfl: 2   omeprazole (PRILOSEC) 40 MG capsule, Take 1 capsule (40 mg total) by mouth 2 (two) times daily before a meal., Disp: 60 capsule, Rfl: 2   oxyCODONE (ROXICODONE) 15 MG immediate release tablet, Take 1-2 tablets (15-30 mg total) by mouth every 4 (four) hours as needed for pain., Disp: 180 tablet, Rfl: 0   pramipexole (MIRAPEX) 0.25 MG tablet, Take 2 tablets (0.5 mg total) by mouth at bedtime., Disp: 180 tablet, Rfl: 2   pregabalin (LYRICA) 150 MG capsule, Take 1 capsule (150 mg total) by mouth 2 (two) times daily., Disp: 180 capsule, Rfl: 1   RESTASIS 0.05 % ophthalmic emulsion, Place 1 drop into both eyes 2 (two) times daily. , Disp: , Rfl:    rosuvastatin (CRESTOR) 10 MG tablet, Take 1 tablet (10 mg total) by mouth daily., Disp: 90 tablet, Rfl: 4   sertraline (ZOLOFT) 50 MG tablet, TAKE 1 TABLET BY MOUTH DAILY, Disp: 90 tablet, Rfl: 4   Family History  Problem Relation Age of Onset   Diabetes Father    Hypertension Father    CAD Father    Diabetes Sister        non-Insulin dependent diabetes mellitus   Heart disease Brother    Cancer Daughter        cancer of the cervix at age 35-18   Heart disease Brother    Breast cancer Paternal Aunt    Colon cancer Paternal Uncle    Ovarian cancer Neg Hx      Social History   Tobacco Use   Smoking status: Never   Smokeless tobacco: Never  Vaping Use   Vaping status: Never Used  Substance Use Topics   Alcohol  use: Not Currently    Comment:  rare   Drug use: No    Allergies as of 04/20/2023 - Review Complete 04/20/2023  Allergen Reaction Noted   Hydrocodone Itching 12/05/2014   Lisinopril Cough 10/29/2016   Mirtazapine  05/29/2015   Other Nausea And Vomiting 04/28/2018    Review of Systems:    All systems reviewed and negative except where noted in HPI.   Physical Exam:  BP (!) 157/83 (BP Location: Left Arm, Patient Position: Sitting, Cuff Size: Normal)   Pulse 78   Temp 98.4 F (36.9 C) (Oral)   Ht 5\' 8"  (1.727 m)   Wt 189 lb 2 oz (85.8 kg)   BMI 28.76 kg/m  No LMP recorded. Patient has had a hysterectomy.  General:   Alert,  Well-developed, well-nourished, pleasant and cooperative in NAD Head:  Normocephalic and atraumatic. Eyes:  Sclera clear, no icterus.   Conjunctiva pink. Ears:  Normal auditory acuity. Nose:  No deformity, discharge, or lesions. Mouth:  No deformity or lesions,oropharynx pink & moist. Neck:  Supple; no masses or thyromegaly. Lungs:  Respirations even and unlabored.  Clear throughout to auscultation.   No wheezes, crackles, or rhonchi. No acute distress. Heart:  Regular rate and rhythm; no murmurs, clicks, rubs, or gallops. Abdomen:  Normal bowel sounds. Soft, non-tender and non-distended without masses, hepatosplenomegaly or hernias noted.  No guarding or rebound tenderness.   Rectal: Not performed Msk:  Symmetrical without gross deformities. Good, equal movement & strength bilaterally. Pulses:  Normal pulses noted. Extremities:  No clubbing or edema.  No cyanosis. Neurologic:  Alert and oriented x3;  grossly normal neurologically. Skin:  Intact without significant lesions or rashes. No jaundice. Psych:  Alert and cooperative. Normal mood and affect.  Imaging Studies: Reviewed  Assessment and Plan:   Courtney Ross is a 67 y.o. female with history of cholecystectomy, chronic GERD, chronic opioid use, chronic constipation, fatty liver  Chronic constipation Advised to  increase Linzess to 290 mcg daily Reiterated to cut back on processed wheat flour, processed breads, portion control Discussed about high-fiber diet, exercise Advised patient to discuss with Dr. Sherrie Mustache regarding weight loss medications  Chronic GERD EGD revealed mild erosive esophagitis Continue omeprazole to 40 mg p.o. once or twice daily before meals Reiterated antireflux lifestyle  Elevated LFTs Check [facility]roSure Ultrasound liver  History of adenomatous polyp of the colon Recommend [facility] colonoscopy with 2-day prep  Follow up in 4 months   Courtney Repress, MD

## 2023-04-20 NOTE — Patient Instructions (Signed)
Your Ultrasound is schedule for 04/27/2023 arrive to the medical mall [facility] at 7:45am for a 8:00am scan. Nothing to eat or drink after midnight. If you need to reschedule please call [phone] option 3 and then option 2.

## 2023-04-22 LAB — NASH FIBROSURE(R) PLUS
ALPHA 2-MACROGLOBULINS, QN: 168 mg/dL (ref 110–276)
ALT (SGPT) P5P: 20 [IU]/L (ref 0–40)
AST (SGOT) P5P: 24 [IU]/L (ref 0–40)
Apolipoprotein A-1: 194 mg/dL (ref 116–209)
Bilirubin, Total: 0.3 mg/dL (ref 0.0–1.2)
Cholesterol, Total: 228 mg/dL — ABNORMAL HIGH (ref 100–199)
Fibrosis Score: 0.06 (ref 0.00–0.21)
GGT: 25 [IU]/L (ref 0–60)
Glucose: 83 mg/dL (ref 70–99)
Haptoglobin: 202 mg/dL (ref 37–355)
NASH Score: 0.47 — ABNORMAL HIGH (ref 0.00–0.25)
Steatosis Score: 0.43 — ABNORMAL HIGH (ref 0.00–0.40)
Triglycerides: 168 mg/dL — ABNORMAL HIGH (ref 0–149)

## 2023-04-23 ENCOUNTER — Other Ambulatory Visit: Payer: Self-pay | Admitting: Gastroenterology

## 2023-04-23 DIAGNOSIS — K219 Gastro-esophageal reflux disease without esophagitis: Secondary | ICD-10-CM

## 2023-04-23 MED ORDER — LINACLOTIDE 290 MCG PO CAPS: 290.0000 ug | ORAL_CAPSULE | Freq: Every day | ORAL | 5 refills | Status: DC

## 2023-04-23 MED ORDER — OMEPRAZOLE 40 MG PO CPDR
40.0000 mg | DELAYED_RELEASE_CAPSULE | Freq: Two times a day (BID) | ORAL | 2 refills | Status: AC
Start: 2023-04-23 — End: ?

## 2023-04-23 NOTE — Telephone Encounter (Signed)
The patient called in about her refill request for the increase dosage. The medication is (Linzess) and (Prilosec) and [facility] is [facility].

## 2023-04-23 NOTE — Addendum Note (Signed)
Addended by: Radene Knee L on: 04/23/2023 12:46 PM   Modules accepted: Orders

## 2023-04-23 NOTE — Addendum Note (Signed)
Addended by: Radene Knee L on: 04/23/2023 12:56 PM   Modules accepted: Orders

## 2023-04-27 ENCOUNTER — Ambulatory Visit: Payer: 59

## 2023-04-27 ENCOUNTER — Other Ambulatory Visit: Payer: Self-pay | Admitting: Family Medicine

## 2023-04-27 NOTE — Telephone Encounter (Signed)
Medication Refill - Medication: oxyCODONE (ROXICODONE) 15 MG immediate release tablet [[id number]]   Has the patient contacted their pharmacy? Yes.    (Agent: If yes, when and what did the pharmacy advise?) Contact PCP   Preferred Pharmacy (with phone number or street name): [facility]   Has the patient been seen for an appointment in the last year OR does the patient have an upcoming appointment? Yes.    Agent: Please be advised that RX refills may take up to 3 business days. We ask that you follow-up with your pharmacy.

## 2023-04-28 ENCOUNTER — Encounter: Payer: Self-pay | Admitting: Gastroenterology

## 2023-04-28 ENCOUNTER — Ambulatory Visit
Admission: RE | Admit: 2023-04-28 | Discharge: 2023-04-28 | Disposition: A | Discharge status: Home / Self Care | Payer: 59 | Source: Ambulatory Visit | Attending: Gastroenterology | Admitting: Gastroenterology

## 2023-04-28 DIAGNOSIS — R7989 Other specified abnormal findings of blood chemistry: Secondary | ICD-10-CM | POA: Insufficient documentation

## 2023-04-28 NOTE — Telephone Encounter (Signed)
Requested medication (s) are due for refill today: yes  Requested medication (s) are on the active medication list: yes  Last refill:  03/27/23  Future visit scheduled: no  Notes to clinic:  Unable to refill per protocol, cannot delegate.      Requested Prescriptions  Pending Prescriptions Disp Refills   oxyCODONE (ROXICODONE) 15 MG immediate release tablet 180 tablet 0    Sig: Take 1-2 tablets (15-30 mg total) by mouth every 4 (four) hours as needed for pain.     Not Delegated - Analgesics:  Opioid Agonists Failed - 04/27/2023  3:19 PM      Failed - This refill cannot be delegated      Failed - Urine Drug Screen completed in last 360 days      Failed - Valid encounter within last 3 months    Recent Outpatient Visits           5 months ago Estrogen deficiency   [facility] Malva Limes, MD   8 months ago Acute non-recurrent pansinusitis   [facility] [place], Marzella Schlein, MD   10 months ago Congestion of left ear   [facility] Jacky Kindle, FNP   11 months ago Contusion of left wrist, initial encounter   [facility] Malva Limes, MD   11 months ago Hoarseness, persistent   [facility] Malva Limes, MD

## 2023-04-29 MED ORDER — OXYCODONE HCL 15 MG PO TABS: 15.0000 mg | ORAL_TABLET | ORAL | 0 refills | Status: DC | PRN

## 2023-05-11 NOTE — Progress Notes (Unsigned)
ANNUAL PREVENTATIVE CARE GYNECOLOGY  ENCOUNTER NOTE  Subjective:       Courtney Ross is a 67 y.o. [phone] female here for a routine annual gynecologic exam. The patient {is/is not/has never been:13135} sexually active. The patient {is/is not:13135} taking hormone replacement therapy. {post-men bleed:13152::"Patient denies post-menopausal vaginal bleeding."} The patient wears seatbelts: {yes/no:311178}. The patient participates in regular exercise: {yes/no/not asked:9010}. Has the patient ever been transfused or tattooed?: {yes/no/not asked:9010}. The patient reports that there {is/is not:9024} domestic violence in her life.  Current complaints: 1.  ***    Gynecologic History No LMP recorded. Patient has had a hysterectomy. Contraception: post menopausal status Last Pap: ***. Results were: {norm/abn:16337} Last mammogram: ***. Results were: {norm/abn:16337} Last Colonoscopy:  Last Dexa Scan:    Obstetric History OB History  Gravida Para Term Preterm AB Living  7 4 4   3 4   SAB IAB Ectopic Multiple Live Births  2   1   4     # Outcome Date GA Lbr Len/2nd Weight Sex Type Anes PTL Lv  7 Term 1984   6 lb 6.4 oz (2.903 kg) F Vag-Spont   LIV  6 Term 1980   8 lb 4.8 oz (3.765 kg) M Vag-Spont   LIV  5 Term 1977   7 lb (3.175 kg) M Vag-Spont   LIV  4 Term 1972   7 lb 9.6 oz (3.447 kg) M Vag-Spont   LIV  3 Ectopic           2 SAB           1 SAB             Past Medical History:  Diagnosis Date   Anxiety    Atypical squamous cells of undetermined significance on cytologic smear of vagina (ASC-US) 11/22/2014   Back pain    Colon polyp    Constipation    Degenerative disc disease, lumbar    Depression    Family history of adverse reaction to anesthesia    daughter nausea   Fatty liver    Fatty liver disease, nonalcoholic    GERD (gastroesophageal reflux disease)    H/O spinal fusion    Headache    migraines   Headache, migraine 05/08/2015   Hepatitis    in the  1970s.  Resolved.   High cholesterol    History of frequent ear infections in childhood 11/22/2014   Overview:   DID have Chicken Pox. DID have Measles. DID have Mumps. DID have Scarlet Fever.      Formatting of this note might be different from the original.  DID have Chicken Pox. DID have Measles. DID have Mumps. DID have Scarlet Fever.   Hypertension    Lower back pain    Polycystic ovarian disease    PONV (postoperative nausea and vomiting)    due to surgery from the 1990's   Restless leg syndrome    Spasm 01/29/2009   Spinal cord stimulator status    Wears dentures    full upper, partial lower    Family History  Problem Relation Age of Onset   Diabetes Father    Hypertension Father    CAD Father    Diabetes Sister        non-Insulin dependent diabetes mellitus   Heart disease Brother    Cancer Daughter        cancer of the cervix at age 42-18   Heart disease Brother    Breast cancer Paternal  Aunt    Colon cancer Paternal Uncle    Ovarian cancer Neg Hx     Past Surgical History:  Procedure Laterality Date   APPENDECTOMY  1988   ARTHRODESIS METATARSALPHALANGEAL JOINT (MTPJ) Left 12/14/2014   Procedure: ARTHRODESIS METATARSALPHALANGEAL JOINT (MTPJ) 1ST;  Surgeon: Recardo Evangelist, DPM;  Location: [facility];  Service: Podiatry;  Laterality: Left;  LMA   back fusion  2004   lumbar region. had broken facet joint.    BACK SURGERY  [phone]   x3   BLADDER SUSPENSION  2006   BREAST BIOPSY Left 1990's   core Sankar   BREAST SURGERY  2005   lumpectomy   CARPAL TUNNEL RELEASE Bilateral 02/26/07,05/21/07   CATARACT EXTRACTION W/PHACO Right 02/11/2021   Procedure: CATARACT EXTRACTION PHACO AND INTRAOCULAR LENS PLACEMENT (IOC) RIGHT PANOPTIX LENS;  Surgeon: Nevada Crane, MD;  Location: [facility];  Service: Ophthalmology;  Laterality: Right;  PANOPTIX LENS 1.92 00:17.8   CATARACT EXTRACTION W/PHACO Left 03/04/2021   Procedure: CATARACT EXTRACTION  PHACO AND INTRAOCULAR  LENS PLACEMENT (IOC) LEFT PANOPTIX LENS 2.87 00:30.0;  Surgeon: Nevada Crane, MD;  Location: [facility];  Service: Ophthalmology;  Laterality: Left;  PANOPTIX LENS   CHOLECYSTECTOMY N/A 05/03/2018   Procedure: LAPAROSCOPIC CHOLECYSTECTOMY WITH INTRAOPERATIVE CHOLANGIOGRAM;  Surgeon: Earline Mayotte, MD;  Location: [facility];  Service: General;  Laterality: N/A;   COLONOSCOPY     COLONOSCOPY WITH PROPOFOL N/A 01/10/2020   Procedure: COLONOSCOPY WITH PROPOFOL;  Surgeon: Wyline Mood, MD;  Location: [facility];  Service: Gastroenterology;  Laterality: N/A;   COLPORRHAPHY     DILATION AND CURETTAGE OF UTERUS     ECTOPIC PREGNANCY SURGERY  1982   ESOPHAGOGASTRODUODENOSCOPY (EGD) WITH PROPOFOL N/A 05/08/2022   Procedure: ESOPHAGOGASTRODUODENOSCOPY (EGD) WITH BIOPSY;  Surgeon: Toney Reil, MD;  Location: [facility];  Service: Endoscopy;  Laterality: N/A;   FOOT SURGERY Left 2017   fusion on big toe, hammer toe, bunions   HAMMER TOE SURGERY Left 03/22/2015   Procedure: HAMMER TOE CORRECTION L 2ND AND 3RD ;  Surgeon: Recardo Evangelist, DPM;  Location: [facility];  Service: Podiatry;  Laterality: Left;  WITH LOCAL   HERNIA REPAIR Right 1988   inguinal   LAMINECTOMY  1993, 1994   NECK SURGERY  2006   fusion   RECTOCELE REPAIR N/A 08/25/2016   Procedure: POSTERIOR REPAIR (RECTOCELE);  Surgeon: Herold Harms, MD;  Location: [facility];  Service: Gynecology;  Laterality: N/A;   SPINAL CORD STIMULATOR BATTERY EXCHANGE Right 04/08/2019   Procedure: SPINAL CORD STIMULATOR BATTERY EXCHANGE;  Surgeon: Odette Fraction, MD;  Location: [facility];  Service: Neurosurgery;  Laterality: Right;  SPINAL CORD STIMULATOR BATTERY EXCHANGE   SPINAL CORD STIMULATOR IMPLANT  2014   stimulator in the ileo inguinal nerve due to scar tissue in abdomen   TONSILLECTOMY  1985   [place]   VENTRAL HERNIA REPAIR N/A 09/01/2018   6.4 cm  Ventra lite mesh. HERNIA REPAIR VENTRAL ADULT;  Surgeon: Earline Mayotte, MD;  Location: [facility];  Service: General;  Laterality: N/A;    Social History   Socioeconomic History   Marital status: Divorced    Spouse name: Not on file   Number of children: 4   Years of education: Not on file   Highest education level: 9th grade  Occupational History    Comment: disabled  Tobacco Use   Smoking status: Never  Smokeless tobacco: Never  Vaping Use   Vaping status: Never Used  Substance and Sexual Activity   Alcohol use: Not Currently    Comment: rare   Drug use: No   Sexual activity: Not Currently    Birth control/protection: Surgical  Other Topics Concern   Not on file  Social History Narrative   Pt is currently receiving assistance from [employer] and gets food stamps.    Social Determinants of Health   Financial Resource Strain: Medium Risk (11/13/2022)   Overall Financial Resource Strain (CARDIA)    Difficulty of Paying Living Expenses: Somewhat hard  Food Insecurity: Unknown (11/13/2022)   Hunger Vital Sign    Worried About Running Out of Food in the Last Year: Never true    Ran Out of Food in the Last Year: Patient declined  Transportation Needs: Patient Declined (11/13/2022)   PRAPARE - Administrator, Civil Service (Medical): Patient declined    Lack of Transportation (Non-Medical): Patient declined  Physical Activity: Insufficiently Active (11/13/2022)   Exercise Vital Sign    Days of Exercise per Week: 2 days    Minutes of Exercise per Session: 60 min  Stress: No Stress Concern Present (11/13/2022)   [employer] of Occupational Health - Occupational Stress Questionnaire    Feeling of Stress : Only a little  Social Connections: Moderately Integrated (11/13/2022)   Social Connection and Isolation Panel [NHANES]    Frequency of Communication with Friends and Family: Three times a week    Frequency of Social Gatherings with Friends and Family: Once a week     Attends Religious Services: More than 4 times per year    Active Member of [employer] or Organizations: Yes    Attends Banker Meetings: 1 to 4 times per year    Marital Status: Divorced  Intimate Partner Violence: Not At Risk (08/14/2022)   Humiliation, Afraid, Rape, and Kick questionnaire    Fear of Current or Ex-Partner: No    Emotionally Abused: No    Physically Abused: No    Sexually Abused: No    Current Outpatient Medications on File Prior to Visit  Medication Sig Dispense Refill   aspirin EC 81 MG tablet Take 81 mg by mouth at bedtime.     buPROPion (WELLBUTRIN SR) 150 MG 12 hr tablet TAKE 1 TABLET BY MOUTH 3 TIMES  DAILY 300 tablet 3   Cholecalciferol (VITAMIN D3) 5000 UNITS CAPS Take 5,000 Units by mouth daily.      Coenzyme Q10 200 MG capsule Take 200 mg by mouth daily.     cyclobenzaprine (FLEXERIL) 10 MG tablet Take 1 tablet (10 mg total) by mouth 3 (three) times daily as needed for muscle spasms. 270 tablet 0   diazepam (VALIUM) 10 MG tablet TAKE 1 TABLET BY MOUTH EVERY 6 HOURS AS NEEDED FOR ANXIETY. 120 tablet 0   diclofenac Sodium (VOLTAREN) 1 % GEL SMARTSIG:1 Gram(s) Topical 4 Times Daily PRN 100 g 4   fluticasone (FLONASE) 50 MCG/ACT nasal spray Place 1 spray into both nostrils daily as needed for allergies or rhinitis.     lidocaine (LIDODERM) 5 % APPLY 1 PATCH TOPICALLY TO SKIN  DAILY MAY BE LEFT ON UP TO 12  HOURS WITHIN A 24 HOUR PERIOD AS DIRECTED BY MD 90 patch 4   linaclotide (LINZESS) 290 MCG CAPS capsule Take 1 capsule (290 mcg total) by mouth daily before breakfast. 30 capsule 5   LINZESS 145 MCG CAPS capsule TAKE 1 CAPSULE  BY MOUTH DAILY 90 capsule 4   meloxicam (MOBIC) 15 MG tablet TAKE 1 TABLET BY MOUTH AT  BEDTIME AS NEEDED FOR PAIN 100 tablet 2   Multiple Vitamin (MULTIVITAMIN WITH MINERALS) TABS tablet Take 1 tablet by mouth daily.     MYRBETRIQ 25 MG TB24 tablet TAKE 1 TABLET BY MOUTH DAILY 100 tablet 2   omeprazole (PRILOSEC) 40 MG capsule  Take 1 capsule (40 mg total) by mouth 2 (two) times daily before a meal. 60 capsule 2   oxyCODONE (ROXICODONE) 15 MG immediate release tablet Take 1-2 tablets (15-30 mg total) by mouth every 4 (four) hours as needed for pain. 180 tablet 0   pramipexole (MIRAPEX) 0.25 MG tablet Take 2 tablets (0.5 mg total) by mouth at bedtime. 180 tablet 2   pregabalin (LYRICA) 150 MG capsule Take 1 capsule (150 mg total) by mouth 2 (two) times daily. 180 capsule 1   RESTASIS 0.05 % ophthalmic emulsion Place 1 drop into both eyes 2 (two) times daily.      rosuvastatin (CRESTOR) 10 MG tablet Take 1 tablet (10 mg total) by mouth daily. 90 tablet 4   sertraline (ZOLOFT) 50 MG tablet TAKE 1 TABLET BY MOUTH DAILY 90 tablet 4   No current facility-administered medications on file prior to visit.    Allergies  Allergen Reactions   Hydrocodone Itching    Even in cough syrup causes all body itching   Lisinopril Cough   Mirtazapine     Weight gain   Other Nausea And Vomiting    general anesthesia from 1990      Review of Systems ROS Review of Systems - General ROS: negative for - chills, fatigue, fever, hot flashes, night sweats, weight gain or weight loss Psychological ROS: negative for - anxiety, decreased libido, depression, mood swings, physical abuse or sexual abuse Ophthalmic ROS: negative for - blurry vision, eye pain or loss of vision ENT ROS: negative for - headaches, hearing change, visual changes or vocal changes Allergy and Immunology ROS: negative for - hives, itchy/watery eyes or seasonal allergies Hematological and Lymphatic ROS: negative for - bleeding problems, bruising, swollen lymph nodes or weight loss Endocrine ROS: negative for - galactorrhea, hair pattern changes, hot flashes, malaise/lethargy, mood swings, palpitations, polydipsia/polyuria, skin changes, temperature intolerance or unexpected weight changes Breast ROS: negative for - new or changing breast lumps or nipple  discharge Respiratory ROS: negative for - cough or shortness of breath Cardiovascular ROS: negative for - chest pain, irregular heartbeat, palpitations or shortness of breath Gastrointestinal ROS: no abdominal pain, change in bowel habits, or black or bloody stools Genito-Urinary ROS: no dysuria, trouble voiding, or hematuria Musculoskeletal ROS: negative for - joint pain or joint stiffness Neurological ROS: negative for - bowel and bladder control changes Dermatological ROS: negative for rash and skin lesion changes   Objective:   There were no vitals taken for this visit. CONSTITUTIONAL: Well-developed, well-nourished female in no acute distress.  PSYCHIATRIC: Normal mood and affect. Normal behavior. Normal judgment and thought content. NEUROLGIC: Alert and oriented to person, place, and time. Normal muscle tone coordination. No cranial nerve deficit noted. HENT:  Normocephalic, atraumatic, External right and left ear normal. Oropharynx is clear and moist EYES: Conjunctivae and EOM are normal. Pupils are equal, round, and reactive to light. No scleral icterus.  NECK: Normal range of motion, supple, no masses.  Normal thyroid.  SKIN: Skin is warm and dry. No rash noted. Not diaphoretic. No erythema. No pallor. CARDIOVASCULAR: Normal heart  rate noted, regular rhythm, no murmur. RESPIRATORY: Clear to auscultation bilaterally. Effort and breath sounds normal, no problems with respiration noted. BREASTS: Symmetric in size. No masses, skin changes, nipple drainage, or lymphadenopathy. ABDOMEN: Soft, normal bowel sounds, no distention noted.  No tenderness, rebound or guarding.  BLADDER: Normal PELVIC:  Bladder {:311640}  Urethra: {:311719}  Vulva: {:311722}  Vagina: {:311643}  Cervix: {:311644}  Uterus: {:311718}  Adnexa: {:311645}  RV: {Blank multiple:19196::"External Exam NormaI","No Rectal Masses","Normal Sphincter tone"}  MUSCULOSKELETAL: Normal range of motion. No tenderness.  No  cyanosis, clubbing, or edema.  2+ distal pulses. LYMPHATIC: No Axillary, Supraclavicular, or Inguinal Adenopathy.   Labs: Lab Results  Component Value Date   WBC 5.4 08/27/2021   HGB 14.3 08/27/2021   HCT 43.8 08/27/2021   MCV 77 (L) 08/27/2021   PLT 287 08/27/2021    Lab Results  Component Value Date   CREATININE 0.97 08/27/2021   BUN 20 08/27/2021   NA 140 08/27/2021   K 4.6 08/27/2021   CL 102 08/27/2021   CO2 22 08/27/2021    Lab Results  Component Value Date   ALT 34 (H) 08/27/2021   AST 31 08/27/2021   ALKPHOS 111 08/27/2021   BILITOT 0.8 08/27/2021    Lab Results  Component Value Date   CHOL 228 (H) 04/20/2023   HDL 72 08/27/2021   LDLCALC 143 (H) 08/27/2021   TRIG 168 (H) 04/20/2023   CHOLHDL 3.2 08/27/2021    Lab Results  Component Value Date   TSH 2.620 08/27/2021    Lab Results  Component Value Date   HGBA1C 5.5 11/08/2019     Assessment:   No diagnosis found.   Plan:  Pap: Not needed Mammogram:  UTD Colon Screening:  {Blank multiple:19196::"***","Ordered","Not Ordered","Not Indicated"} Labs: {Blank multiple:19196::"Lipid 1","FBS","TSH","Hemoglobin A1C","Vit D Level""***"} Routine preventative health maintenance measures emphasized: {Blank multiple:19196::"Exercise/Diet/Weight control","Tobacco Warnings","Alcohol/Substance use risks","Stress Management","Peer Pressure Issues","Safe Sex"} Flu vaccine status: COVID Vaccination status: Return to Clinic - 1 Year     Hildred Laser, MD [facility]

## 2023-05-11 NOTE — Patient Instructions (Incomplete)
Preventive Care 65 Years and Older, Female Preventive care refers to lifestyle choices and visits with your health care provider that can promote health and wellness. Preventive care visits are also called wellness exams. What can I expect for my preventive care visit? Counseling Your health care provider may ask you questions about your: Medical history, including: Past medical problems. Family medical history. Pregnancy and menstrual history. History of falls. Current health, including: Memory and ability to understand (cognition). Emotional well-being. Home life and relationship well-being. Sexual activity and sexual health. Lifestyle, including: Alcohol, nicotine or tobacco, and drug use. Access to firearms. Diet, exercise, and sleep habits. Work and work environment. Sunscreen use. Safety issues such as seatbelt and bike helmet use. Physical exam Your health care provider will check your: Height and weight. These may be used to calculate your BMI (body mass index). BMI is a measurement that tells if you are at a healthy weight. Waist circumference. This measures the distance around your waistline. This measurement also tells if you are at a healthy weight and may help predict your risk of certain diseases, such as type 2 diabetes and high blood pressure. Heart rate and blood pressure. Body temperature. Skin for abnormal spots. What immunizations do I need?  Vaccines are usually given at various ages, according to a schedule. Your health care provider will recommend vaccines for you based on your age, medical history, and lifestyle or other factors, such as travel or where you work. What tests do I need? Screening Your health care provider may recommend screening tests for certain conditions. This may include: Lipid and cholesterol levels. Hepatitis C test. Hepatitis B test. HIV (human immunodeficiency virus) test. STI (sexually transmitted infection) testing, if you are at  risk. Lung cancer screening. Colorectal cancer screening. Diabetes screening. This is done by checking your blood sugar (glucose) after you have not eaten for a while (fasting). Mammogram. Talk with your health care provider about how often you should have regular mammograms. BRCA-related cancer screening. This may be done if you have a family history of breast, ovarian, tubal, or peritoneal cancers. Bone density scan. This is done to screen for osteoporosis. Talk with your health care provider about your test results, treatment options, and if necessary, the need for more tests. Follow these instructions at home: Eating and drinking  Eat a diet that includes fresh fruits and vegetables, whole grains, lean protein, and low-fat dairy products. Limit your intake of foods with high amounts of sugar, saturated fats, and salt. Take vitamin and mineral supplements as recommended by your health care provider. Do not drink alcohol if your health care provider tells you not to drink. If you drink alcohol: Limit how much you have to 0-1 drink a day. Know how much alcohol is in your drink. In the [place] one drink equals one 12 oz bottle of beer (355 mL), one 5 oz glass of wine (148 mL), or one 1 oz glass of hard liquor (44 mL). Lifestyle Brush your teeth every morning and night with fluoride toothpaste. Floss one time each day. Exercise for at least 30 minutes 5 or more days each week. Do not use any products that contain nicotine or tobacco. These products include cigarettes, chewing tobacco, and vaping devices, such as e-cigarettes. If you need help quitting, ask your health care provider. Do not use drugs. If you are sexually active, practice safe sex. Use a condom or other form of protection in order to prevent STIs. Take aspirin only as told by   your health care provider. Make sure that you understand how much to take and what form to take. Work with your health care provider to find out whether it  is safe and beneficial for you to take aspirin daily. Ask your health care provider if you need to take a cholesterol-lowering medicine (statin). Find healthy ways to manage stress, such as: Meditation, yoga, or listening to music. Journaling. Talking to a trusted person. Spending time with friends and family. Minimize exposure to UV radiation to reduce your risk of skin cancer. Safety Always wear your seat belt while driving or riding in a vehicle. Do not drive: If you have been drinking alcohol. Do not ride with someone who has been drinking. When you are tired or distracted. While texting. If you have been using any mind-altering substances or drugs. Wear a helmet and other protective equipment during sports activities. If you have firearms in your house, make sure you follow all gun safety procedures. What's next? Visit your health care provider once a year for an annual wellness visit. Ask your health care provider how often you should have your eyes and teeth checked. Stay up to date on all vaccines. This information is not intended to replace advice given to you by your health care provider. Make sure you discuss any questions you have with your health care provider. Document Revised: 12/05/2020 Document Reviewed: 12/05/2020 Elsevier Patient Education  2024 [employer] Breast Self-Awareness Breast self-awareness is knowing how your breasts look and feel. You need to: Check your breasts on a regular basis. Tell your doctor about any changes. Become familiar with the look and feel of your breasts. This can help you catch a breast problem while it is still small and can be treated. You should do breast self-exams even if you have breast implants. What you need: A mirror. A well-lit room. A pillow or other soft object. How to do a breast self-exam Follow these steps to do a breast self-exam: Look for changes  Take off all the clothes above your waist. Stand in front of a  mirror in a room with good lighting. Put your hands down at your sides. Compare your breasts in the mirror. Look for any difference between them, such as: A difference in shape. A difference in size. Wrinkles, dips, and bumps in one breast and not the other. Look at each breast for changes in the skin, such as: Redness. Scaly areas. Skin that has gotten thicker. Dimpling. Open sores (ulcers). Look for changes in your nipples, such as: Fluid coming out of a nipple. Fluid around a nipple. Bleeding. Dimpling. Redness. A nipple that looks pushed in (retracted), or that has changed position. Feel for changes Lie on your back. Feel each breast. To do this: Pick a breast to feel. Place a pillow under the shoulder closest to that breast. Put the arm closest to that breast behind your head. Feel the nipple area of that breast using the hand of your other arm. Feel the area with the pads of your three middle fingers by making small circles with your fingers. Use light, medium, and firm pressure. Continue the overlapping circles, moving downward over the breast. Keep making circles with your fingers. Stop when you feel your ribs. Start making circles with your fingers again, this time going upward until you reach your collarbone. Then, make circles outward across your breast and into your armpit area. Squeeze your nipple. Check for discharge and lumps. Repeat these steps to check your other   breast. Sit or stand in the tub or shower. With soapy water on your skin, feel each breast the same way you did when you were lying down. Write down what you find Writing down what you find can help you remember what to tell your doctor. Write down: What is normal for each breast. Any changes you find in each breast. These include: The kind of changes you find. A tender or painful breast. Any lump you find. Write down its size and where it is. When you last had your monthly period (menstrual  cycle). General tips If you are breastfeeding, the best time to check your breasts is after you feed your baby or after you use a breast pump. If you get monthly bleeding, the best time to check your breasts is 5-7 days after your monthly cycle ends. With time, you will become comfortable with the self-exam. You will also start to know if there are changes in your breasts. Contact a doctor if: You see a change in the shape or size of your breasts or nipples. You see a change in the skin of your breast or nipples, such as red or scaly skin. You have fluid coming from your nipples that is not normal. You find a new lump or thick area. You have breast pain. You have any concerns about your breast health. Summary Breast self-awareness includes looking for changes in your breasts and feeling for changes within your breasts. You should do breast self-awareness in front of a mirror in a well-lit room. If you get monthly periods (menstrual cycles), the best time to check your breasts is 5-7 days after your period ends. Tell your doctor about any changes you see in your breasts. Changes include changes in size, changes on the skin, painful or tender breasts, or fluid from your nipples that is not normal. This information is not intended to replace advice given to you by your health care provider. Make sure you discuss any questions you have with your health care provider. Document Revised: 11/14/2021 Document Reviewed: 04/11/2021 Elsevier Patient Education  2024 [employer]  

## 2023-05-12 ENCOUNTER — Ambulatory Visit (INDEPENDENT_AMBULATORY_CARE_PROVIDER_SITE_OTHER): Payer: 59 | Admitting: Obstetrics and Gynecology

## 2023-05-12 ENCOUNTER — Encounter: Payer: Self-pay | Admitting: Obstetrics and Gynecology

## 2023-05-12 VITALS — BP 114/86 | HR 90 | Resp 16 | Ht 68.5 in | Wt 191.8 lb

## 2023-05-12 DIAGNOSIS — I10 Essential (primary) hypertension: Secondary | ICD-10-CM

## 2023-05-12 DIAGNOSIS — R635 Abnormal weight gain: Secondary | ICD-10-CM

## 2023-05-12 DIAGNOSIS — Z01419 Encounter for gynecological examination (general) (routine) without abnormal findings: Secondary | ICD-10-CM | POA: Diagnosis not present

## 2023-05-12 DIAGNOSIS — N951 Menopausal and female climacteric states: Secondary | ICD-10-CM

## 2023-05-12 DIAGNOSIS — N3281 Overactive bladder: Secondary | ICD-10-CM

## 2023-05-12 DIAGNOSIS — Z780 Asymptomatic menopausal state: Secondary | ICD-10-CM

## 2023-05-12 DIAGNOSIS — E785 Hyperlipidemia, unspecified: Secondary | ICD-10-CM

## 2023-05-12 DIAGNOSIS — Z860100 Personal history of colon polyps, unspecified: Secondary | ICD-10-CM

## 2023-05-12 MED ORDER — PHENTERMINE HCL 15 MG PO CAPS
15.0000 mg | ORAL_CAPSULE | ORAL | 0 refills | Status: DC
Start: 2023-05-12 — End: 2023-06-08

## 2023-05-12 MED ORDER — ESTROGENS CONJUGATED 0.45 MG PO TABS
0.4500 mg | ORAL_TABLET | Freq: Every day | ORAL | 3 refills | Status: DC
Start: 2023-05-12 — End: 2023-06-09

## 2023-05-13 ENCOUNTER — Telehealth: Payer: Self-pay | Admitting: Obstetrics and Gynecology

## 2023-05-13 NOTE — Telephone Encounter (Signed)
Reached out to pt to schedule one month f/u appt for weight check with Dr. Valentino Saxon.  Left message for pt to call back to schedule.

## 2023-05-13 NOTE — Telephone Encounter (Signed)
Pt is scheduled on 06/09/23 with Dr. Valentino Saxon at 2:35.

## 2023-05-19 ENCOUNTER — Encounter: Payer: Self-pay | Admitting: Gastroenterology

## 2023-05-20 ENCOUNTER — Emergency Department: Payer: 59

## 2023-05-20 ENCOUNTER — Inpatient Hospital Stay
Admission: EM | Admit: 2023-05-20 | Discharge: 2023-05-20 | Disposition: A | Discharge status: Home / Self Care | Payer: 59 | Source: Home / Self Care | Attending: Internal Medicine | Admitting: Internal Medicine

## 2023-05-20 ENCOUNTER — Telehealth: Payer: Self-pay

## 2023-05-20 ENCOUNTER — Ambulatory Visit: Payer: 59 | Admitting: Certified Registered Nurse Anesthetist

## 2023-05-20 ENCOUNTER — Observation Stay
Admission: EM | Admit: 2023-05-20 | Discharge: 2023-05-21 | Disposition: A | Discharge status: Home / Self Care | Payer: 59 | Attending: Emergency Medicine | Admitting: Emergency Medicine

## 2023-05-20 ENCOUNTER — Other Ambulatory Visit: Payer: Self-pay

## 2023-05-20 ENCOUNTER — Encounter: Payer: Self-pay | Admitting: Gastroenterology

## 2023-05-20 ENCOUNTER — Encounter
Admission: EM | Disposition: A | Discharge status: Home / Self Care | Payer: Self-pay | Source: Home / Self Care | Attending: Emergency Medicine

## 2023-05-20 DIAGNOSIS — Z7982 Long term (current) use of aspirin: Secondary | ICD-10-CM | POA: Insufficient documentation

## 2023-05-20 DIAGNOSIS — K635 Polyp of colon: Secondary | ICD-10-CM

## 2023-05-20 DIAGNOSIS — G8929 Other chronic pain: Secondary | ICD-10-CM | POA: Diagnosis present

## 2023-05-20 DIAGNOSIS — Z79899 Other long term (current) drug therapy: Secondary | ICD-10-CM | POA: Insufficient documentation

## 2023-05-20 DIAGNOSIS — I4891 Unspecified atrial fibrillation: Secondary | ICD-10-CM | POA: Diagnosis present

## 2023-05-20 DIAGNOSIS — K5730 Diverticulosis of large intestine without perforation or abscess without bleeding: Secondary | ICD-10-CM | POA: Insufficient documentation

## 2023-05-20 DIAGNOSIS — I110 Hypertensive heart disease with heart failure: Secondary | ICD-10-CM | POA: Diagnosis not present

## 2023-05-20 DIAGNOSIS — K2210 Ulcer of esophagus without bleeding: Secondary | ICD-10-CM | POA: Diagnosis present

## 2023-05-20 DIAGNOSIS — G894 Chronic pain syndrome: Secondary | ICD-10-CM | POA: Diagnosis present

## 2023-05-20 DIAGNOSIS — Z860100 Personal history of colon polyps, unspecified: Secondary | ICD-10-CM | POA: Diagnosis not present

## 2023-05-20 DIAGNOSIS — Z1211 Encounter for screening for malignant neoplasm of colon: Secondary | ICD-10-CM

## 2023-05-20 DIAGNOSIS — D124 Benign neoplasm of descending colon: Secondary | ICD-10-CM | POA: Insufficient documentation

## 2023-05-20 DIAGNOSIS — D123 Benign neoplasm of transverse colon: Secondary | ICD-10-CM | POA: Diagnosis not present

## 2023-05-20 DIAGNOSIS — I10 Essential (primary) hypertension: Secondary | ICD-10-CM | POA: Diagnosis not present

## 2023-05-20 DIAGNOSIS — Z780 Asymptomatic menopausal state: Secondary | ICD-10-CM

## 2023-05-20 DIAGNOSIS — E785 Hyperlipidemia, unspecified: Secondary | ICD-10-CM | POA: Diagnosis present

## 2023-05-20 DIAGNOSIS — K644 Residual hemorrhoidal skin tags: Secondary | ICD-10-CM | POA: Insufficient documentation

## 2023-05-20 DIAGNOSIS — F324 Major depressive disorder, single episode, in partial remission: Secondary | ICD-10-CM | POA: Diagnosis present

## 2023-05-20 DIAGNOSIS — F419 Anxiety disorder, unspecified: Secondary | ICD-10-CM | POA: Diagnosis present

## 2023-05-20 DIAGNOSIS — K219 Gastro-esophageal reflux disease without esophagitis: Secondary | ICD-10-CM | POA: Diagnosis present

## 2023-05-20 DIAGNOSIS — G2581 Restless legs syndrome: Secondary | ICD-10-CM | POA: Diagnosis present

## 2023-05-20 HISTORY — PX: POLYPECTOMY: SHX5525

## 2023-05-20 HISTORY — PX: COLONOSCOPY WITH PROPOFOL: SHX5780

## 2023-05-20 LAB — CBC
HCT: 40.9 % (ref 36.0–46.0)
Hemoglobin: 13.1 g/dL (ref 12.0–15.0)
MCH: 24.7 pg — ABNORMAL LOW (ref 26.0–34.0)
MCHC: 32 g/dL (ref 30.0–36.0)
MCV: 77.2 fL — ABNORMAL LOW (ref 80.0–100.0)
Platelets: 325 10*3/uL (ref 150–400)
RBC: 5.3 MIL/uL — ABNORMAL HIGH (ref 3.87–5.11)
RDW: 14.6 % (ref 11.5–15.5)
WBC: 6.5 10*3/uL (ref 4.0–10.5)
nRBC: 0 % (ref 0.0–0.2)

## 2023-05-20 LAB — BASIC METABOLIC PANEL
Anion gap: 9 (ref 5–15)
BUN: 12 mg/dL (ref 8–23)
CO2: 24 mmol/L (ref 22–32)
Calcium: 8.3 mg/dL — ABNORMAL LOW (ref 8.9–10.3)
Chloride: 105 mmol/L (ref 98–111)
Creatinine, Ser: 1 mg/dL (ref 0.44–1.00)
GFR, Estimated: >60 mL/min (ref >60)
Glucose, Bld: 104 mg/dL — ABNORMAL HIGH (ref 70–99)
Potassium: 3.3 mmol/L — ABNORMAL LOW (ref 3.5–5.1)
Sodium: 138 mmol/L (ref 135–145)

## 2023-05-20 LAB — URINALYSIS, COMPLETE (UACMP) WITH MICROSCOPIC
Bilirubin Urine: NEGATIVE
Glucose, UA: NEGATIVE mg/dL
Hgb urine dipstick: NEGATIVE
Ketones, ur: NEGATIVE mg/dL
Leukocytes,Ua: NEGATIVE
Nitrite: NEGATIVE
Protein, ur: NEGATIVE mg/dL
RBC / HPF: 0 RBC/hpf (ref 0–5)
Specific Gravity, Urine: 1.01 (ref 1.005–1.030)
pH: 8 (ref 5.0–8.0)

## 2023-05-20 LAB — TSH: TSH: 3.831 u[IU]/mL (ref 0.350–4.500)

## 2023-05-20 LAB — MAGNESIUM: Magnesium: 2.4 mg/dL (ref 1.7–2.4)

## 2023-05-20 LAB — TROPONIN I (HIGH SENSITIVITY)
Troponin I (High Sensitivity): 7 ng/L
Troponin I (High Sensitivity): 10 ng/L

## 2023-05-20 LAB — BRAIN NATRIURETIC PEPTIDE: B Natriuretic Peptide: 45.6 pg/mL (ref 0.0–100.0)

## 2023-05-20 SURGERY — COLONOSCOPY WITH PROPOFOL
Anesthesia: General

## 2023-05-20 MED ORDER — METOPROLOL TARTRATE 25 MG PO TABS
12.5000 mg | ORAL_TABLET | Freq: Two times a day (BID) | ORAL | Status: DC
  Administered 2023-05-20: 12.5 mg via ORAL
  Filled 2023-05-20: qty 1

## 2023-05-20 MED ORDER — LIDOCAINE HCL (CARDIAC) PF 100 MG/5ML IV SOSY
PREFILLED_SYRINGE | INTRAVENOUS | Status: DC | PRN
  Administered 2023-05-20: 50 mg via INTRAVENOUS

## 2023-05-20 MED ORDER — SODIUM CHLORIDE 0.9 % IV SOLN: INTRAVENOUS | Status: DC

## 2023-05-20 MED ORDER — ROSUVASTATIN CALCIUM 10 MG PO TABS
10.0000 mg | ORAL_TABLET | Freq: Every day | ORAL | Status: DC
  Administered 2023-05-20 – 2023-05-21 (×2): 10 mg via ORAL
  Filled 2023-05-20 (×2): qty 1

## 2023-05-20 MED ORDER — FLUTICASONE PROPIONATE 50 MCG/ACT NA SUSP: 1.0000 | Freq: Every day | NASAL | Status: DC | PRN

## 2023-05-20 MED ORDER — MIRABEGRON ER 25 MG PO TB24
25.0000 mg | ORAL_TABLET | Freq: Every day | ORAL | Status: DC
  Administered 2023-05-20 – 2023-05-21 (×2): 25 mg via ORAL
  Filled 2023-05-20 (×2): qty 1

## 2023-05-20 MED ORDER — SERTRALINE HCL 50 MG PO TABS
50.0000 mg | ORAL_TABLET | Freq: Every day | ORAL | Status: DC
  Administered 2023-05-20 – 2023-05-21 (×2): 50 mg via ORAL
  Filled 2023-05-20 (×2): qty 1

## 2023-05-20 MED ORDER — METOPROLOL TARTRATE 5 MG/5ML IV SOLN
5.0000 mg | Freq: Once | INTRAVENOUS | Status: AC
  Administered 2023-05-20: 5 mg via INTRAVENOUS

## 2023-05-20 MED ORDER — CYCLOBENZAPRINE HCL 10 MG PO TABS: 10.0000 mg | ORAL_TABLET | Freq: Three times a day (TID) | ORAL | Status: DC | PRN

## 2023-05-20 MED ORDER — DILTIAZEM LOAD VIA INFUSION
10.0000 mg | Freq: Once | INTRAVENOUS | Status: AC
  Administered 2023-05-20: 10 mg via INTRAVENOUS
  Filled 2023-05-20: qty 10

## 2023-05-20 MED ORDER — BUPROPION HCL ER (SR) 150 MG PO TB12
150.0000 mg | ORAL_TABLET | Freq: Three times a day (TID) | ORAL | Status: DC
  Administered 2023-05-20 – 2023-05-21 (×2): 150 mg via ORAL
  Filled 2023-05-20 (×4): qty 1

## 2023-05-20 MED ORDER — CYCLOSPORINE 0.05 % OP EMUL
1.0000 [drp] | Freq: Two times a day (BID) | OPHTHALMIC | Status: DC
  Administered 2023-05-20 – 2023-05-21 (×2): 1 [drp] via OPHTHALMIC
  Filled 2023-05-20 (×2): qty 30

## 2023-05-20 MED ORDER — METOPROLOL TARTRATE 5 MG/5ML IV SOLN
INTRAVENOUS | Status: AC
  Filled 2023-05-20: qty 5

## 2023-05-20 MED ORDER — LABETALOL HCL 5 MG/ML IV SOLN: 5.0000 mg | INTRAVENOUS | Status: DC | PRN

## 2023-05-20 MED ORDER — ACETAMINOPHEN 650 MG RE SUPP: 650.0000 mg | Freq: Four times a day (QID) | RECTAL | Status: DC | PRN

## 2023-05-20 MED ORDER — POTASSIUM CHLORIDE 10 MEQ/100ML IV SOLN
10.0000 meq | INTRAVENOUS | Status: AC
  Administered 2023-05-20 (×2): 10 meq via INTRAVENOUS
  Filled 2023-05-20 (×2): qty 100

## 2023-05-20 MED ORDER — PHENYLEPHRINE 80 MCG/ML (10ML) SYRINGE FOR IV PUSH (FOR BLOOD PRESSURE SUPPORT)
PREFILLED_SYRINGE | INTRAVENOUS | Status: DC | PRN
  Administered 2023-05-20: 80 ug via INTRAVENOUS
  Administered 2023-05-20: 160 ug via INTRAVENOUS

## 2023-05-20 MED ORDER — DILTIAZEM HCL-DEXTROSE 125-5 MG/125ML-% IV SOLN (PREMIX)
5.0000 mg/h | INTRAVENOUS | Status: DC
  Administered 2023-05-20: 5 mg/h via INTRAVENOUS
  Filled 2023-05-20: qty 125

## 2023-05-20 MED ORDER — DEXMEDETOMIDINE HCL IN NACL 80 MCG/20ML IV SOLN
INTRAVENOUS | Status: DC | PRN
  Administered 2023-05-20: 8 ug via INTRAVENOUS

## 2023-05-20 MED ORDER — PANTOPRAZOLE SODIUM 40 MG PO TBEC
80.0000 mg | DELAYED_RELEASE_TABLET | Freq: Two times a day (BID) | ORAL | Status: DC
  Administered 2023-05-20 – 2023-05-21 (×2): 80 mg via ORAL
  Filled 2023-05-20 (×2): qty 2

## 2023-05-20 MED ORDER — DIAZEPAM 5 MG PO TABS: 10.0000 mg | ORAL_TABLET | Freq: Two times a day (BID) | ORAL | Status: DC | PRN

## 2023-05-20 MED ORDER — DILTIAZEM HCL 25 MG/5ML IV SOLN
10.0000 mg | Freq: Once | INTRAVENOUS | Status: AC
  Administered 2023-05-20: 10 mg via INTRAVENOUS

## 2023-05-20 MED ORDER — PROPOFOL 10 MG/ML IV BOLUS
INTRAVENOUS | Status: DC | PRN
  Administered 2023-05-20: 80 mg via INTRAVENOUS
  Administered 2023-05-20: 150 ug/kg/min via INTRAVENOUS
  Administered 2023-05-20: 20 mg via INTRAVENOUS

## 2023-05-20 MED ORDER — VITAMIN D3 25 MCG (1000 UNIT) PO TABS
5000.0000 [IU] | ORAL_TABLET | Freq: Every day | ORAL | Status: DC
Start: 2023-05-21 — End: 2023-05-21
  Administered 2023-05-21: 5000 [IU] via ORAL
  Filled 2023-05-20 (×2): qty 5

## 2023-05-20 MED ORDER — PROPOFOL 1000 MG/100ML IV EMUL
INTRAVENOUS | Status: AC
  Filled 2023-05-20: qty 100

## 2023-05-20 MED ORDER — ONDANSETRON HCL 4 MG PO TABS: 4.0000 mg | ORAL_TABLET | Freq: Four times a day (QID) | ORAL | Status: DC | PRN

## 2023-05-20 MED ORDER — OXYCODONE HCL 5 MG PO TABS: 15.0000 mg | ORAL_TABLET | ORAL | Status: DC | PRN

## 2023-05-20 MED ORDER — ONDANSETRON HCL 4 MG/2ML IJ SOLN: 4.0000 mg | Freq: Four times a day (QID) | INTRAMUSCULAR | Status: DC | PRN

## 2023-05-20 MED ORDER — LINACLOTIDE 290 MCG PO CAPS: 290.0000 ug | ORAL_CAPSULE | Freq: Every day | ORAL | 1 refills | Status: DC

## 2023-05-20 MED ORDER — SODIUM CHLORIDE 0.9 % IV BOLUS
500.0000 mL | Freq: Once | INTRAVENOUS | Status: AC
  Administered 2023-05-20: 500 mL via INTRAVENOUS

## 2023-05-20 MED ORDER — SENNOSIDES-DOCUSATE SODIUM 8.6-50 MG PO TABS: 1.0000 | ORAL_TABLET | Freq: Every evening | ORAL | Status: DC | PRN

## 2023-05-20 MED ORDER — OXYCODONE HCL 5 MG PO TABS: 15.0000 mg | ORAL_TABLET | Freq: Four times a day (QID) | ORAL | Status: DC | PRN

## 2023-05-20 MED ORDER — ACETAMINOPHEN 325 MG PO TABS: 650.0000 mg | ORAL_TABLET | Freq: Four times a day (QID) | ORAL | Status: DC | PRN

## 2023-05-20 MED ORDER — ASPIRIN 81 MG PO TBEC: 81.0000 mg | DELAYED_RELEASE_TABLET | Freq: Every day | ORAL | Status: DC

## 2023-05-20 NOTE — Telephone Encounter (Signed)
Sent medication to the pharmacy

## 2023-05-20 NOTE — ED Provider Notes (Signed)
[facility] Provider Note    Event Date/Time   First MD Initiated Contact with Patient 05/20/23 1203     (approximate)   History   A-fib  HPI  Courtney Ross is a 67 y.o. 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  Patient was having a colonoscopy when she was noted to go into A-fib with RVR.  Patient has not had an EKG for many years.  She denies any known history of A-fib.  Her heart rates are currently at 115 she denies any symptoms of chest pain or shortness of breath.  No swelling in her legs.  No calf tenderness.   Physical Exam   Triage Vital Signs: ED Triage Vitals [05/20/23 0707]  Encounter Vitals Group     BP 133/78     Systolic BP Percentile      Diastolic BP Percentile      Pulse Rate 76     Resp 18     Temp (!) 96.7 F (35.9 C)     Temp Source Temporal     SpO2 98 %     Weight 187 lb (84.8 kg)     Height 5\' 8"  (1.727 m)     Head Circumference      Peak Flow      Pain Score 0     Pain Loc      Pain Education      Exclude from Growth Chart     Most recent vital signs: Vitals:   05/20/23 0907 05/20/23 0950  BP:  98/79  Pulse: (!) 119 (!) 121  Resp:    Temp:    SpO2:  98%     General: Awake, no distress.  CV:  Good peripheral perfusion.  Irregular, tachycardic Resp:  Normal effort.  Abd:  No distention.  Other:  No swelling in legs.  No calf tenderness   ED Results / Procedures / Treatments   Labs (all labs ordered are listed, but only abnormal results are displayed) Labs Reviewed  BASIC METABOLIC PANEL - Abnormal; Notable for the following components:      Result Value   Potassium 3.3 (*)    Glucose, Bld 104 (*)    Calcium 8.3 (*)    All other components within normal limits  CBC - Abnormal; Notable for the following components:   RBC 5.30 (*)    MCV 77.2 (*)    MCH 24.7 (*)    All other components within normal  limits  MAGNESIUM  SURGICAL PATHOLOGY     EKG  My interpretation of EKG:  Atrial fibrillation with a rate of 129 without any any ST elevation or T wave inversions, QT C is 506  RADIOLOGY I have reviewed the xray personally and interpreted and no PNA  PROCEDURES:  Critical Care performed: Yes, see critical care procedure note(s)  .1-3 Lead EKG Interpretation  Performed by: Concha Se, MD Authorized by: Concha Se, MD     Interpretation: abnormal     ECG rate:  130   ECG rate assessment: tachycardic     Rhythm: atrial fibrillation     Ectopy: none     Conduction: normal      MEDICATIONS ORDERED IN ED: Medications  diltiazem (CARDIZEM) 1 mg/mL load via infusion 10 mg ( Intravenous MAR Unhold 05/20/23 1156)    And  diltiazem (CARDIZEM) 125 mg in dextrose 5%  125 mL (1 mg/mL) infusion ( Intravenous MAR Unhold 05/20/23 1156)  metoprolol tartrate (LOPRESSOR) injection 5 mg (5 mg Intravenous Given 05/20/23 0917)  metoprolol tartrate (LOPRESSOR) injection 5 mg (5 mg Intravenous Given 05/20/23 0936)     IMPRESSION / MDM / ASSESSMENT AND PLAN / ED COURSE  I reviewed the triage vital signs and the nursing notes.   Patient's presentation is most consistent with acute presentation with potential threat to life or bodily function.   Patient comes in with concerns for A-fib with RVR with colonoscopy.  Patient was already seen by Dr. Mariah Milling in the colonoscopy suite.  Patient has no EKG prior to the procedure but her heart rates were normal.  Is unclear if patient could have been A-fib prior to the procedure and just not known about it given she is still asymptomatic with heart rates in the 120s to 150s.  His plan was to place patient on diltiazem drip and to hold off on Amio or cardioversion.  Will give a little bit of fluids given I suspect her low blood pressure is related to the sedation and start diltiazem.  We will hold off on heparin given she did undergo polyp  clipping.   BNP is reassuring initial troponin negative TSH negative  Discussed with patient admission to the hospital and she feels comfortable.  I discussed the hospital team for admission   The patient is on the cardiac monitor to evaluate for evidence of arrhythmia and/or significant heart rate changes.      FINAL CLINICAL IMPRESSION(S) / ED DIAGNOSES   Final diagnoses:  Atrial fibrillation with rapid ventricular response (HCC)     Rx / DC Orders       Note:  This document was prepared using Dragon voice recognition software and may include unintentional dictation errors.   Concha Se, MD 05/20/23 [phone]

## 2023-05-20 NOTE — H&P (Signed)
[name], MD [address]  [address]  [place], [place] [address]  Main: [phone]  Fax: [phone] Pager: [phone]  Primary Care Physician:  Malva Limes, MD Primary Gastroenterologist:  Dr. [name]  Pre-Procedure History & Physical: HPI:  Courtney Ross is a 67 y.o. female is here for an colonoscopy.   Past Medical History:  Diagnosis Date   Anxiety    Atypical squamous cells of undetermined significance on cytologic smear of vagina (ASC-US) 11/22/2014   Back pain    Colon polyp    Constipation    Degenerative disc disease, lumbar    Depression    Family history of adverse reaction to anesthesia    daughter nausea   Fatty liver    Fatty liver disease, nonalcoholic    GERD (gastroesophageal reflux disease)    H/O spinal fusion    Headache    migraines   Headache, migraine 05/08/2015   Hepatitis    in the 1970s.  Resolved.   High cholesterol    History of frequent ear infections in childhood 11/22/2014   Overview:   DID have Chicken Pox. DID have Measles. DID have Mumps. DID have Scarlet Fever.      Formatting of this note might be different from the original.  DID have Chicken Pox. DID have Measles. DID have Mumps. DID have Scarlet Fever.   Hypertension    Lower back pain    Polycystic ovarian disease    PONV (postoperative nausea and vomiting)    due to surgery from the 1990's   Restless leg syndrome    Spasm 01/29/2009   Spinal cord stimulator status    Wears dentures    full upper, partial lower    Past Surgical History:  Procedure Laterality Date   APPENDECTOMY  1988   ARTHRODESIS METATARSALPHALANGEAL JOINT (MTPJ) Left 12/14/2014   Procedure: ARTHRODESIS METATARSALPHALANGEAL JOINT (MTPJ) 1ST;  Surgeon: Recardo Evangelist, DPM;  Location: [facility];  Service: Podiatry;  Laterality: Left;  LMA   back fusion  2004   lumbar region. had broken facet joint.    BACK SURGERY  [phone]   x3   BLADDER SUSPENSION   2006   BREAST BIOPSY Left 1990's   core Sankar   BREAST SURGERY  2005   lumpectomy   CARPAL TUNNEL RELEASE Bilateral 02/26/07,05/21/07   CATARACT EXTRACTION W/PHACO Right 02/11/2021   Procedure: CATARACT EXTRACTION PHACO AND INTRAOCULAR LENS PLACEMENT (IOC) RIGHT PANOPTIX LENS;  Surgeon: Nevada Crane, MD;  Location: [facility];  Service: Ophthalmology;  Laterality: Right;  PANOPTIX LENS 1.92 00:17.8   CATARACT EXTRACTION W/PHACO Left 03/04/2021   Procedure: CATARACT EXTRACTION PHACO AND INTRAOCULAR  LENS PLACEMENT (IOC) LEFT PANOPTIX LENS 2.87 00:30.0;  Surgeon: Nevada Crane, MD;  Location: [facility];  Service: Ophthalmology;  Laterality: Left;  PANOPTIX LENS   CHOLECYSTECTOMY N/A 05/03/2018   Procedure: LAPAROSCOPIC CHOLECYSTECTOMY WITH INTRAOPERATIVE CHOLANGIOGRAM;  Surgeon: Earline Mayotte, MD;  Location: [facility];  Service: General;  Laterality: N/A;   COLONOSCOPY     COLONOSCOPY WITH PROPOFOL N/A 01/10/2020   Procedure: COLONOSCOPY WITH PROPOFOL;  Surgeon: Wyline Mood, MD;  Location: [facility];  Service: Gastroenterology;  Laterality: N/A;   COLPORRHAPHY     DILATION AND CURETTAGE OF UTERUS     ECTOPIC PREGNANCY SURGERY  1982   ESOPHAGOGASTRODUODENOSCOPY (EGD) WITH PROPOFOL N/A 05/08/2022   Procedure: ESOPHAGOGASTRODUODENOSCOPY (EGD) WITH BIOPSY;  Surgeon: Toney Reil, MD;  Location: [facility];  Service: Endoscopy;  Laterality: N/A;   FOOT SURGERY Left 2017   fusion on big toe, hammer toe, bunions   HAMMER TOE SURGERY Left 03/22/2015   Procedure: HAMMER TOE CORRECTION L 2ND AND 3RD ;  Surgeon: Recardo Evangelist, DPM;  Location: [facility];  Service: Podiatry;  Laterality: Left;  WITH LOCAL   HERNIA REPAIR Right 1988   inguinal   LAMINECTOMY  1993, 1994   NECK SURGERY  2006   fusion   RECTOCELE REPAIR N/A 08/25/2016   Procedure: POSTERIOR REPAIR (RECTOCELE);  Surgeon: Herold Harms, MD;  Location: [facility];  Service:  Gynecology;  Laterality: N/A;   SPINAL CORD STIMULATOR BATTERY EXCHANGE Right 04/08/2019   Procedure: SPINAL CORD STIMULATOR BATTERY EXCHANGE;  Surgeon: Odette Fraction, MD;  Location: [facility] OR;  Service: Neurosurgery;  Laterality: Right;  SPINAL CORD STIMULATOR BATTERY EXCHANGE   SPINAL CORD STIMULATOR IMPLANT  2014   stimulator in the ileo inguinal nerve due to scar tissue in abdomen   TONSILLECTOMY  1985   [place]OTAL [place]BDOM[place]ST[place]CT[place]   VENTRAL HERNIA REPAIR N/A 09/01/2018   6.4 cm Ventra lite mesh. HERNIA REPAIR VENTRAL ADULT;  Surgeon: Earline Mayotte, MD;  Location: [facility];  Service: General;  Laterality: N/A;    Prior to Admission medications   Medication Sig Start Date End Date Taking? Authorizing Provider  aspirin EC 81 MG tablet Take 81 mg by mouth at bedtime.   Yes [provider]  buPROPion (WELLBUTRIN SR) 150 MG 12 hr tablet TAKE 1 TABLET BY MOUTH 3 TIMES  DAILY 09/04/22  Yes Malva Limes, MD  Cholecalciferol (VITAMIN D3) 5000 UNITS CAPS Take 5,000 Units by mouth daily.    Yes [provider]  Coenzyme Q10 200 MG capsule Take 200 mg by mouth daily.   Yes [provider]  cyclobenzaprine (FLEXERIL) 10 MG tablet Take 1 tablet (10 mg total) by mouth 3 (three) times daily as needed for muscle spasms. 10/01/22  Yes Malva Limes, MD  diazepam (VALIUM) 10 MG tablet TAKE 1 TABLET BY MOUTH EVERY 6 HOURS AS NEEDED FOR ANXIETY. 06/13/22  Yes Merita Norton T, FNP  diclofenac Sodium (VOLTAREN) 1 % GEL SMARTSIG:1 Gram(s) Topical 4 Times Daily PRN 04/24/21  Yes Malva Limes, MD  estrogens, conjugated, (PREMARIN) 0.45 MG tablet Take 1 tablet (0.45 mg total) by mouth daily. Take daily for 21 days then do not take for 7 days. 05/12/23  Yes Hildred Laser, MD  fluticasone (FLONASE) 50 MCG/ACT nasal spray Place 1 spray into both nostrils daily as needed for allergies or rhinitis.   Yes [provider]  lidocaine (LIDODERM) 5 % APPLY 1 PATCH TOPICALLY  TO SKIN  DAILY MAY BE LEFT ON UP TO 12  HOURS WITHIN A 24 HOUR PERIOD AS DIRECTED BY MD 03/17/23  Yes Malva Limes, MD  linaclotide [facility]) 290 MCG CAPS capsule Take 1 capsule (290 mcg total) by mouth daily before breakfast. 04/23/23  Yes Asser Lucena, Loel Dubonnet, MD  meloxicam (MOBIC) 15 MG tablet TAKE 1 TABLET BY MOUTH AT  BEDTIME AS NEEDED FOR PAIN 11/27/22  Yes Malva Limes, MD  Multiple Vitamin (MULTIVITAMIN WITH MINERALS) TABS tablet Take 1 tablet by mouth daily.   Yes [provider]  MYRBETRIQ 25 MG TB24 tablet TAKE 1 TABLET BY MOUTH DAILY 11/27/22  Yes Malva Limes, MD  omeprazole (PRILOSEC) 40 MG capsule Take 1 capsule (40 mg total) by mouth 2 (two)  times daily before a meal. 04/23/23 07/22/23 Yes Jacalyn Biggs, Loel Dubonnet, MD  oxyCODONE (ROXICODONE) 15 MG immediate release tablet Take 1-2 tablets (15-30 mg total) by mouth every 4 (four) hours as needed for pain. 04/29/23  Yes Malva Limes, MD  phentermine 15 MG capsule Take 1 capsule (15 mg total) by mouth every morning. 05/12/23  Yes Hildred Laser, MD  pramipexole (MIRAPEX) 0.25 MG tablet Take 2 tablets (0.5 mg total) by mouth at bedtime. 03/31/23  Yes Malva Limes, MD  pregabalin (LYRICA) 150 MG capsule Take 1 capsule (150 mg total) by mouth 2 (two) times daily. 03/23/23  Yes Malva Limes, MD  RESTASIS 0.05 % ophthalmic emulsion Place 1 drop into both eyes 2 (two) times daily.  11/21/15  Yes [provider]  rosuvastatin (CRESTOR) 10 MG tablet Take 1 tablet (10 mg total) by mouth daily. 03/23/23  Yes Malva Limes, MD  sertraline (ZOLOFT) 50 MG tablet TAKE 1 TABLET BY MOUTH DAILY 03/11/23  Yes Malva Limes, MD    Allergies as of 04/21/2023 - Review Complete 04/20/2023  Allergen Reaction Noted   Hydrocodone Itching 12/05/2014   Lisinopril Cough 10/29/2016   Mirtazapine  05/29/2015   Other Nausea And Vomiting 04/28/2018    Family History  Problem Relation Age of Onset   Diabetes Father    Hypertension  Father    CAD Father    Diabetes Sister        non-Insulin dependent diabetes mellitus   Heart disease Brother    Cancer Daughter        cancer of the cervix at age 21-18   Heart disease Brother    Breast cancer Paternal Aunt    Colon cancer Paternal Uncle    Ovarian cancer Neg Hx     Social History   Socioeconomic History   Marital status: Divorced    Spouse name: Not on file   Number of children: 4   Years of education: Not on file   Highest education level: 9th grade  Occupational History    Comment: disabled  Tobacco Use   Smoking status: Never   Smokeless tobacco: Never  Vaping Use   Vaping status: Never Used  Substance and Sexual Activity   Alcohol use: Not Currently    Comment: rare   Drug use: No   Sexual activity: Not Currently    Birth control/protection: Surgical  Other Topics Concern   Not on file  Social History Narrative   Pt is currently receiving assistance from [employer] and gets food stamps.    Social Determinants of Health   Financial Resource Strain: Medium Risk (11/13/2022)   Overall Financial Resource Strain (CARDIA)    Difficulty of Paying Living Expenses: Somewhat hard  Food Insecurity: Unknown (11/13/2022)   Hunger Vital Sign    Worried About Running Out of Food in the Last Year: Never true    Ran Out of Food in the Last Year: Patient declined  Transportation Needs: Patient Declined (11/13/2022)   PRAPARE - Administrator, Civil Service (Medical): Patient declined    Lack of Transportation (Non-Medical): Patient declined  Physical Activity: Insufficiently Active (11/13/2022)   Exercise Vital Sign    Days of Exercise per Week: 2 days    Minutes of Exercise per Session: 60 min  Stress: No Stress Concern Present (11/13/2022)   [employer] of Occupational Health - Occupational Stress Questionnaire    Feeling of Stress : Only a little  Social Connections: Moderately  Integrated (11/13/2022)   Social Connection and Isolation Panel  [NHANES]    Frequency of Communication with Friends and Family: Three times a week    Frequency of Social Gatherings with Friends and Family: Once a week    Attends Religious Services: More than 4 times per year    Active Member of [employer] or Organizations: Yes    Attends Banker Meetings: 1 to 4 times per year    Marital Status: Divorced  Intimate Partner Violence: Not At Risk (08/14/2022)   Humiliation, Afraid, Rape, and Kick questionnaire    Fear of Current or Ex-Partner: No    Emotionally Abused: No    Physically Abused: No    Sexually Abused: No    Review of Systems: See HPI, otherwise negative ROS  Physical Exam: BP 133/78   Pulse 76   Temp (!) 96.7 F (35.9 C) (Temporal)   Resp 18   Ht 5\' 8"  (1.727 m)   Wt 84.8 kg   SpO2 98%   BMI 28.43 kg/m  General:   Alert,  pleasant and cooperative in NAD Head:  Normocephalic and atraumatic. Neck:  Supple; no masses or thyromegaly. Lungs:  Clear throughout to auscultation.    Heart:  Regular rate and rhythm. Abdomen:  Soft, nontender and nondistended. Normal bowel sounds, without guarding, and without rebound.   Neurologic:  Alert and  oriented x4;  grossly normal neurologically.  Impression/Plan: Courtney Ross is here for an colonoscopy to be performed for h/o colon adenomas  Risks, benefits, limitations, and alternatives regarding  colonoscopy have been reviewed with the patient.  Questions have been answered.  All parties agreeable.   Lannette Donath, MD  05/20/2023, 7:49 AM

## 2023-05-20 NOTE — Op Note (Signed)
[facility] Gastroenterology Patient Name: Courtney Ross Procedure Date: 05/20/2023 7:31 AM MRN: [id number] Account #: [id number] Date of Birth: 1955-09-25 Admit Type: Outpatient Age: 67 Room: [facility] ENDO ROOM 4 Gender: Female Note Status: Finalized Instrument Name: Prentice Docker [id number] Procedure:             Colonoscopy Indications:           Surveillance: Personal history of adenomatous polyps                         on last colonoscopy 5 years ago, Last colonoscopy:                         July 2021 Providers:             Toney Reil MD, MD Medicines:             General Anesthesia Complications:         No immediate complications. Estimated blood loss: None. Procedure:             Pre-Anesthesia Assessment:                        - Prior to the procedure, a History and Physical was                         performed, and patient medications and allergies were                         reviewed. The risks and benefits of the procedure and                         the sedation options and risks were discussed with the                         patient. All questions were answered and informed                         consent was obtained. Patient identification and                         proposed procedure were verified by the physician, the                         nurse, the anesthesiologist, the anesthetist and the                         technician in the pre-procedure area in the procedure                         room in the endoscopy suite. Mental Status                         Examination: alert and oriented. Airway Examination:                         normal oropharyngeal airway and neck mobility.                         Respiratory Examination: clear to  auscultation. CV                         Examination: normal. Prophylactic Antibiotics: The                         patient does not require prophylactic antibiotics.                         Prior  Anticoagulants: The patient has taken no                         anticoagulant or antiplatelet agents. ASA Grade                         Assessment: II - A patient with mild systemic disease.                         After reviewing the risks and benefits, the patient                         was deemed in satisfactory condition to undergo the                         procedure. The anesthesia plan was to use general                         anesthesia. Immediately prior to administration of                         medications, the patient was re-assessed for adequacy                         to receive sedatives. The heart rate, respiratory                         rate, oxygen saturations, blood pressure, adequacy of                         pulmonary ventilation, and response to care were                         monitored throughout the procedure. The physical                         status of the patient was re-assessed after the                         procedure.                        After obtaining informed consent, the colonoscope was                         passed under direct vision. Throughout the procedure,                         the patient's blood pressure, pulse, and oxygen  saturations were monitored continuously. The                         Colonoscope was introduced through the anus and                         advanced to the the cecum, identified by appendiceal                         orifice and ileocecal valve. The colonoscopy was                         unusually difficult due to multiple diverticula in the                         colon and restricted mobility of the colon. Successful                         completion of the procedure was aided by withdrawing                         the scope and replacing with the pediatric colonoscope                         and applying abdominal pressure. The patient tolerated                         the  procedure well. The quality of the bowel                         preparation was evaluated using the BBPS [facility] Bowel                         Preparation Scale) with scores of: Right Colon = 3,                         Transverse Colon = 3 and Left Colon = 3 (entire mucosa                         seen well with no residual staining, small fragments                         of stool or opaque liquid). The total BBPS score                         equals 9. The ileocecal valve, appendiceal orifice,                         and rectum were photographed. Findings:      Hemorrhoids were found on perianal exam.      A diminutive polyp was found in the transverse colon. The polyp was       sessile. The polyp was removed with a jumbo cold forceps. Resection and       retrieval were complete.      A 5 mm polyp was found in the descending colon. The polyp was sessile.       The polyp was removed  with a cold snare. Resection and retrieval were       complete. Estimated blood loss: none.      Multiple small-mouthed diverticula were found in the recto-sigmoid colon       and sigmoid colon.      Non-bleeding external hemorrhoids were found during retroflexion. The       hemorrhoids were medium-sized. Impression:            - Hemorrhoids found on perianal exam.                        - One diminutive polyp in the transverse colon,                         removed with a jumbo cold forceps. Resected and                         retrieved.                        - One 5 mm polyp in the descending colon, removed with                         a cold snare. Resected and retrieved.                        - Diverticulosis in the recto-sigmoid colon and in the                         sigmoid colon.                        - Non-bleeding external hemorrhoids. Recommendation:        - Discharge patient to home (with escort).                        - Resume previous diet today.                        - Continue  present medications.                        - Await pathology results.                        - Repeat colonoscopy in 5 years for surveillance. Procedure Code(s):     --- Professional ---                        [phone], Colonoscopy, flexible; with removal of                         tumor(s), polyp(s), or other lesion(s) by snare                         technique                        45380, 59, Colonoscopy, flexible; with biopsy, single                         or multiple Diagnosis Code(s):     ---  Professional ---                        Z86.010, Personal history of colonic polyps                        D12.3, Benign neoplasm of transverse colon (hepatic                         flexure or splenic flexure)                        D12.4, Benign neoplasm of descending colon                        K64.4, Residual hemorrhoidal skin tags                        K57.30, Diverticulosis of large intestine without                         perforation or abscess without bleeding CPT copyright 2022 American Medical Association. All rights reserved. The codes documented in this report are preliminary and upon coder review may  be revised to meet current compliance requirements. Dr. Libby Maw Toney Reil MD, MD 05/20/2023 8:27:12 AM This report has been signed electronically. Number of Addenda: 0 Note Initiated On: 05/20/2023 7:31 AM Scope Withdrawal Time: 0 hours 11 minutes 11 seconds  Total Procedure Duration: 0 hours 24 minutes 9 seconds  Estimated Blood Loss:  Estimated blood loss: none.      [facility]

## 2023-05-20 NOTE — Assessment & Plan Note (Signed)
>>  ASSESSMENT AND PLAN FOR HX OF COLONIC POLYPS WRITTEN ON 05/20/2023  3:42 PM BY COX, AMY N, DO  Status post colonoscopy with removal on 05/20/2023

## 2023-05-20 NOTE — Assessment & Plan Note (Addendum)
Home diazepam 10 mg p.o. every 6 hours as needed for anxiety, sertraline 50 mg daily, bupropion 150 mg p.o. 3 times daily were resumed on admission

## 2023-05-20 NOTE — Assessment & Plan Note (Addendum)
Status post colonoscopy with removal on 05/20/2023

## 2023-05-20 NOTE — Assessment & Plan Note (Addendum)
Etiology workup in progress Phentermine has been prescribed outpatient and patient reports that she has not initiated this medication at this time.  She reports that she is only filled it and has not taken a single dose.  Advised patient against phentermine due to adverse effects of palpitation and Tachycardia Check a UA Complete echo ordered Continue diltiazem gtt., goal heart rate less than 115 Appreciate cardiology evaluation and input Admit to telemetry cardiac, inpatient

## 2023-05-20 NOTE — Anesthesia Postprocedure Evaluation (Signed)
Anesthesia Post Note  Patient: Courtney Ross  Procedure(s) Performed: COLONOSCOPY WITH PROPOFOL POLYPECTOMY  Patient location during evaluation: PACU Anesthesia Type: General Level of consciousness: awake and awake and alert Pain management: pain level controlled Vital Signs Assessment: vitals unstable Respiratory status: spontaneous breathing and nonlabored ventilation Cardiovascular status: tachycardic Anesthetic complications: no   No notable events documented.   Last Vitals:  Vitals:   05/20/23 0907 05/20/23 0950  BP:  98/79  Pulse: (!) 119 (!) 121  Resp:    Temp:    SpO2:  98%    Last Pain:  Vitals:   05/20/23 0907  TempSrc:   PainSc: 4                  VAN STAVEREN,Josephus Harriger

## 2023-05-20 NOTE — Assessment & Plan Note (Signed)
Home bupropion 150 mg TID were resumed on admission

## 2023-05-20 NOTE — Anesthesia Postprocedure Evaluation (Signed)
Anesthesia Post Note  Patient: Courtney Ross  Procedure(s) Performed: COLONOSCOPY WITH PROPOFOL POLYPECTOMY  Patient location during evaluation: PACU Anesthesia Type: General Level of consciousness: awake and alert Pain management: pain level controlled Vital Signs Assessment: vitals unstable Cardiovascular status: tachycardic Anesthetic complications: no   No notable events documented.   Last Vitals:  Vitals:   05/20/23 0907 05/20/23 0950  BP:  98/79  Pulse: (!) 119 (!) 121  Resp:    Temp:    SpO2:  98%    Last Pain:  Vitals:   05/20/23 0907  TempSrc:   PainSc: 4                  VAN STAVEREN,Jarrid Lienhard

## 2023-05-20 NOTE — Anesthesia Procedure Notes (Signed)
Date/Time: 05/20/2023 7:57 AM  Performed by: Ginger Carne, CRNAPre-anesthesia Checklist: Patient identified, Emergency Drugs available, Suction available, Patient being monitored and Timeout performed Patient Re-evaluated:Patient Re-evaluated prior to induction Oxygen Delivery Method: Nasal cannula Preoxygenation: Pre-oxygenation with 100% oxygen

## 2023-05-20 NOTE — Anesthesia Preprocedure Evaluation (Addendum)
Anesthesia Evaluation   Patient awake    Reviewed: Allergy & Precautions, NPO status , Patient's Chart, lab work & pertinent test results  History of Anesthesia Complications (+) PONV and history of anesthetic complications  Airway Mallampati: II  TM Distance: >3 FB Neck ROM: Full    Dental  (+) Upper Dentures   Pulmonary neg pulmonary ROS   Pulmonary exam normal breath sounds clear to auscultation       Cardiovascular Exercise Tolerance: Good hypertension, Pt. on medications Normal cardiovascular exam Rhythm:Regular Rate:Normal     Neuro/Psych  Headaches  Anxiety     negative neurological ROS  negative psych ROS   GI/Hepatic negative GI ROS, Neg liver ROS, PUD,GERD  Medicated,,  Endo/Other  negative endocrine ROS    Renal/GU negative Renal ROS  negative genitourinary   Musculoskeletal  (+) Arthritis ,    Abdominal Normal abdominal exam  (+)   Peds negative pediatric ROS (+)  Hematology negative hematology ROS (+)   Anesthesia Other Findings Past Medical History: No date: Anxiety 11/22/2014: Atypical squamous cells of undetermined significance on  cytologic smear of vagina (ASC-US) No date: Back pain No date: Colon polyp No date: Constipation No date: Degenerative disc disease, lumbar No date: Depression No date: Family history of adverse reaction to anesthesia     Comment:  daughter nausea No date: Fatty liver No date: Fatty liver disease, nonalcoholic No date: GERD (gastroesophageal reflux disease) No date: H/O spinal fusion No date: Headache     Comment:  migraines 05/08/2015: Headache, migraine No date: Hepatitis     Comment:  in the 1970s.  Resolved. No date: High cholesterol 11/22/2014: History of frequent ear infections in childhood     Comment:  Overview:   DID have Chicken Pox. DID have Measles. DID               have Mumps. DID have Scarlet Fever.      Formatting of               this  note might be different from the original.  DID have              Chicken Pox. DID have Measles. DID have Mumps. DID have               Scarlet Fever. No date: Hypertension No date: Lower back pain No date: Polycystic ovarian disease No date: PONV (postoperative nausea and vomiting)     Comment:  due to surgery from the 1990's No date: Restless leg syndrome 01/29/2009: Spasm No date: Spinal cord stimulator status No date: Wears dentures     Comment:  full upper, partial lower  Past Surgical History: 1988: APPENDECTOMY 12/14/2014: ARTHRODESIS METATARSALPHALANGEAL JOINT (MTPJ); Left     Comment:  Procedure: ARTHRODESIS METATARSALPHALANGEAL JOINT (MTPJ)              1ST;  Surgeon: Recardo Evangelist, DPM;  Location: [facility]               SURGERY CNTR;  Service: Podiatry;  Laterality: Left;  LMA 2004: back fusion     Comment:  lumbar region. had broken facet joint.  [phone]: BACK SURGERY     Comment:  x3 2006: BLADDER SUSPENSION 1990's: BREAST BIOPSY; Left     Comment:  core Sankar 2005: BREAST SURGERY     Comment:  lumpectomy 02/26/07,05/21/07: CARPAL TUNNEL RELEASE; Bilateral 02/11/2021: CATARACT EXTRACTION W/PHACO; Right     Comment:  Procedure: CATARACT EXTRACTION PHACO AND INTRAOCULAR  LENS PLACEMENT (IOC) RIGHT PANOPTIX LENS;  Surgeon: Nevada Crane, MD;  Location: [facility] SURGERY CNTR;                Service: Ophthalmology;  Laterality: Right;  PANOPTIX               LENS 1.92 00:17.8 03/04/2021: CATARACT EXTRACTION W/PHACO; Left     Comment:  Procedure: CATARACT EXTRACTION PHACO AND INTRAOCULAR                LENS PLACEMENT (IOC) LEFT PANOPTIX LENS 2.87 00:30.0;                Surgeon: Nevada Crane, MD;  Location: [facility]               SURGERY CNTR;  Service: Ophthalmology;  Laterality: Left;              PANOPTIX LENS 05/03/2018: CHOLECYSTECTOMY; N/A     Comment:  Procedure: LAPAROSCOPIC CHOLECYSTECTOMY WITH                INTRAOPERATIVE CHOLANGIOGRAM;  Surgeon: Earline Mayotte, MD;  Location: [facility] ORS;  Service: General;                Laterality: N/A; No date: COLONOSCOPY 01/10/2020: COLONOSCOPY WITH PROPOFOL; N/A     Comment:  Procedure: COLONOSCOPY WITH PROPOFOL;  Surgeon: Wyline Mood, MD;  Location: [facility] ENDOSCOPY;  Service:               Gastroenterology;  Laterality: N/A; No date: COLPORRHAPHY No date: DILATION AND CURETTAGE OF UTERUS 1982: ECTOPIC PREGNANCY SURGERY 05/08/2022: ESOPHAGOGASTRODUODENOSCOPY (EGD) WITH PROPOFOL; N/A     Comment:  Procedure: ESOPHAGOGASTRODUODENOSCOPY (EGD) WITH BIOPSY;              Surgeon: Toney Reil, MD;  Location: [facility]               SURGERY CNTR;  Service: Endoscopy;  Laterality: N/A; 2017: FOOT SURGERY; Left     Comment:  fusion on big toe, hammer toe, bunions 03/22/2015: HAMMER TOE SURGERY; Left     Comment:  Procedure: HAMMER TOE CORRECTION L 2ND AND 3RD ;                Surgeon: Recardo Evangelist, DPM;  Location: [facility] SURGERY               CNTR;  Service: Podiatry;  Laterality: Left;  WITH LOCAL 1988: HERNIA REPAIR; Right     Comment:  inguinal 1993, 1994: LAMINECTOMY 2006: NECK SURGERY     Comment:  fusion 08/25/2016: RECTOCELE REPAIR; N/A     Comment:  Procedure: POSTERIOR REPAIR (RECTOCELE);  Surgeon:               Herold Harms, MD;  Location: [facility] ORS;  Service:               Gynecology;  Laterality: N/A; 04/08/2019: SPINAL CORD STIMULATOR BATTERY EXCHANGE; Right     Comment:  Procedure: SPINAL CORD STIMULATOR BATTERY EXCHANGE;                Surgeon: Odette Fraction, MD;  Location: [facility] OR;  Service:  Neurosurgery;  Laterality: Right;  SPINAL CORD STIMULATOR              BATTERY EXCHANGE 2014: SPINAL CORD STIMULATOR IMPLANT     Comment:  stimulator in the ileo inguinal nerve due to scar tissue              in abdomen 1985: TONSILLECTOMY  : TOTAL ABDOMINAL HYSTERECTOMY     Comment:   [place] 09/01/2018: VENTRAL HERNIA REPAIR; N/A     Comment:  6.4 cm Ventra lite mesh. HERNIA REPAIR VENTRAL ADULT;                Surgeon: Earline Mayotte, MD;  Location: [facility] ORS;                Service: General;  Laterality: N/A;  BMI    Body Mass Index: 28.43 kg/m      Reproductive/Obstetrics negative OB ROS                              Anesthesia Physical Anesthesia Plan  ASA: 2  Anesthesia Plan: General   Post-op Pain Management:    Induction: Intravenous  PONV Risk Score and Plan: Propofol infusion and TIVA  Airway Management Planned: Natural Airway and Nasal Cannula  Additional Equipment:   Intra-op Plan:   Post-operative Plan:   Informed Consent: I have reviewed the patients History and Physical, chart, labs and discussed the procedure including the risks, benefits and alternatives for the proposed anesthesia with the patient or authorized representative who has indicated his/her understanding and acceptance.     Dental Advisory Given  Plan Discussed with: CRNA and Surgeon  Anesthesia Plan Comments:         Anesthesia Quick Evaluation

## 2023-05-20 NOTE — Assessment & Plan Note (Signed)
Rosuvastatin 10 mg daily resumed

## 2023-05-20 NOTE — Assessment & Plan Note (Signed)
Home omeprazole equivalent resumed on admission

## 2023-05-20 NOTE — Assessment & Plan Note (Addendum)
PDMP reviewed Home oxycodone 50 mg p.o. every 6 hours as needed for severe pain resumed Home Flexeril 10 mg 3 times daily as needed for muscle spasms resume

## 2023-05-20 NOTE — ED Triage Notes (Signed)
During pts colonoscopy pt experienced new onset of afib, pt states she did not feel any pain or notice any changes from normal after she awoke.

## 2023-05-20 NOTE — Hospital Course (Signed)
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  Patient is status post colonoscopy and was found to be in A-fib with RVR during recovery.  Anesthesiology service consulted cardiology, Dr. Mariah Milling.  Ultimately, patient was taken to the ED for evaluation.  Vitals at the time of admission showed temperature of 98.5, respiration rate of 14, heart rate 128, blood pressure 102/87, SpO2 of 98% on room air.  Serum sodium is 138, potassium 3.3, chloride 105, bicarb 24, BUN of 12, serum creatinine 1.00, EGFR greater than 60, nonfasting blood glucose 104, WBC 6.5, hemoglobin 13.1, platelets of 325.  ED treatment: Diltiazem 10 mg IV one-time dose, diltiazem gtt., metoprolol 5 mg IV 2 doses were given, potassium chloride 10 mill: X 2, sodium chloride 500 mL bolus.

## 2023-05-20 NOTE — H&P (Addendum)
History and Physical   Courtney Ross ZOX:[id number] DOB: [birth year] DOA: 05/20/2023  PCP: Malva Limes, MD  Outpatient Specialists: Dr. Allegra Lai, gastroenterology Patient coming from: Outpatient endoscopy suite  I have personally briefly reviewed patient's old medical records in [facility] EMR.  Chief Concern: Atrial fibrillation with RVR  HPI: Courtney Ross is a 67 year old female with with history of depression, anxiety, GERD, chronic pain presumed secondary to degeneration of lumbar and lumbosacral intervertebral disc, restless leg syndrome, hyperlipidemia, PCOS, history of fatty liver, who presents to the emergency department for chief concerns of atrial fibrillation with RVR.  Patient is status post colonoscopy and was found to be in A-fib with RVR during recovery.  Anesthesiology service consulted cardiology, Dr. Mariah Milling.  Ultimately, patient was taken to the ED for evaluation.  Vitals at the time of admission showed temperature of 98.5, respiration rate of 14, heart rate 128, blood pressure 102/87, SpO2 of 98% on room air.  Serum sodium is 138, potassium 3.3, chloride 105, bicarb 24, BUN of 12, serum creatinine 1.00, EGFR greater than 60, nonfasting blood glucose 104, WBC 6.5, hemoglobin 13.1, platelets of 325.  ED treatment: Diltiazem 10 mg IV one-time dose, diltiazem gtt., metoprolol 5 mg IV 2 doses were given, potassium chloride 10 mill: X 2, sodium chloride 500 mL bolus. -------------------------------- At bedside, patient was able to tell me her name, age, location, current calendar year.  She reports she feels her baseline self.  She denies chest pain, shortness of breath, nausea, vomiting, dysuria, hematuria, diarrhea.  She denies blood in her stool.  She endorses that she feels sleepy and hungry because she has not eaten in 2 days.  Social history: Patient lives at home with her husband.  She denies tobacco and recreational drug use.  ROS: Constitutional:  no weight change, no fever ENT/Mouth: no sore throat, no rhinorrhea Eyes: no eye pain, no vision changes Cardiovascular: no chest pain, no dyspnea,  no edema, no palpitations Respiratory: no cough, no sputum, no wheezing Gastrointestinal: no nausea, no vomiting, no diarrhea, no constipation, + hungry Genitourinary: no urinary incontinence, no dysuria, no hematuria Musculoskeletal: no arthralgias, no myalgias Skin: no skin lesions, no pruritus, Neuro: + weakness, no loss of consciousness, no syncope Psych: no anxiety, no depression, no decrease appetite Heme/Lymph: no bruising, no bleeding  ED Course: Discussed with EDP, patient requiring hospitalization for chief concerns of atrial fibrillation with RVR.  Assessment/Plan  Principal Problem:   Atrial fibrillation with RVR (HCC) Active Problems:   Chronic lumbar radicular pain (Right side)   Anxiety   Dyslipidemia   Major depressive disorder with single episode, in partial remission (HCC)   Restless legs syndrome   Essential hypertension   Menopause   Chronic pain syndrome   Chronic GERD   Erosive esophagitis   Polyp of descending colon   Polyp of transverse colon   Hx of colonic polyps   Assessment and Plan:  * Atrial fibrillation with RVR (HCC) Etiology workup in progress Phentermine has been prescribed outpatient and patient reports that she has not initiated this medication at this time.  She reports that she is only filled it and has not taken a single dose.  Advised patient against phentermine due to adverse effects of palpitation and Tachycardia Check a UA Complete echo ordered Continue diltiazem gtt., goal heart rate less than 115 Appreciate cardiology evaluation and input Admit to telemetry cardiac, inpatient  Hx of colonic polyps Status post colonoscopy with removal on 05/20/2023  Chronic GERD Home omeprazole equivalent resumed on admission  Chronic pain syndrome PDMP reviewed Home oxycodone 50 mg p.o.  every 6 hours as needed for severe pain resumed Home Flexeril 10 mg 3 times daily as needed for muscle spasms resume  Essential hypertension Labetalol 5 mg IV every 3 hours as needed for SBP > 160, 4 doses ordered  Major depressive disorder with single episode, in partial remission (HCC) Home bupropion 150 mg TID were resumed on admission  Dyslipidemia Rosuvastatin 10 mg daily resumed  Anxiety Home diazepam 10 mg p.o. every 6 hours as needed for anxiety, sertraline 50 mg daily, bupropion 150 mg p.o. 3 times daily were resumed on admission  Chart reviewed.   DVT prophylaxis: TED hose; high risk of bleeding post colonoscopy status post polyp removal.  AM team to initiate pharmacologic DVT prophylaxis when the benefits outweigh the risk Code Status: full code  Diet: Heart healthy Family Communication: updated spouse at bedside Disposition Plan: Pending clinical course Consults called: Cardiology was consulted by anesthesiology service during postop recovery Admission status: Telemetry cardiac, inpatient  Past Medical History:  Diagnosis Date   Anxiety    Atypical squamous cells of undetermined significance on cytologic smear of vagina (ASC-US) 11/22/2014   Back pain    Colon polyp    Constipation    Degenerative disc disease, lumbar    Depression    Family history of adverse reaction to anesthesia    daughter nausea   Fatty liver    Fatty liver disease, nonalcoholic    GERD (gastroesophageal reflux disease)    H/O spinal fusion    Headache    migraines   Headache, migraine 05/08/2015   Hepatitis    in the 1970s.  Resolved.   High cholesterol    History of frequent ear infections in childhood 11/22/2014   Overview:   DID have Chicken Pox. DID have Measles. DID have Mumps. DID have Scarlet Fever.      Formatting of this note might be different from the original.  DID have Chicken Pox. DID have Measles. DID have Mumps. DID have Scarlet Fever.   Hypertension    Lower back  pain    Polycystic ovarian disease    PONV (postoperative nausea and vomiting)    due to surgery from the 1990's   Restless leg syndrome    Spasm 01/29/2009   Spinal cord stimulator status    Wears dentures    full upper, partial lower   Past Surgical History:  Procedure Laterality Date   APPENDECTOMY  1988   ARTHRODESIS METATARSALPHALANGEAL JOINT (MTPJ) Left 12/14/2014   Procedure: ARTHRODESIS METATARSALPHALANGEAL JOINT (MTPJ) 1ST;  Surgeon: Recardo Evangelist, DPM;  Location: [facility] SURGERY CNTR;  Service: Podiatry;  Laterality: Left;  LMA   back fusion  2004   lumbar region. had broken facet joint.    BACK SURGERY  [phone]   x3   BLADDER SUSPENSION  2006   BREAST BIOPSY Left 1990's   core Sankar   BREAST SURGERY  2005   lumpectomy   CARPAL TUNNEL RELEASE Bilateral 02/26/07,05/21/07   CATARACT EXTRACTION W/PHACO Right 02/11/2021   Procedure: CATARACT EXTRACTION PHACO AND INTRAOCULAR LENS PLACEMENT (IOC) RIGHT PANOPTIX LENS;  Surgeon: Nevada Crane, MD;  Location: [facility] SURGERY CNTR;  Service: Ophthalmology;  Laterality: Right;  PANOPTIX LENS 1.92 00:17.8   CATARACT EXTRACTION W/PHACO Left 03/04/2021   Procedure: CATARACT EXTRACTION PHACO AND INTRAOCULAR  LENS PLACEMENT (IOC) LEFT PANOPTIX LENS 2.87 00:30.0;  Surgeon: Nevada Crane, MD;  Location: [facility];  Service: Ophthalmology;  Laterality: Left;  PANOPTIX LENS   CHOLECYSTECTOMY N/A 05/03/2018   Procedure: LAPAROSCOPIC CHOLECYSTECTOMY WITH INTRAOPERATIVE CHOLANGIOGRAM;  Surgeon: Earline Mayotte, MD;  Location: [facility] ORS;  Service: General;  Laterality: N/A;   COLONOSCOPY     COLONOSCOPY WITH PROPOFOL N/A 01/10/2020   Procedure: COLONOSCOPY WITH PROPOFOL;  Surgeon: Wyline Mood, MD;  Location: [facility] ENDOSCOPY;  Service: Gastroenterology;  Laterality: N/A;   COLPORRHAPHY     DILATION AND CURETTAGE OF UTERUS     ECTOPIC PREGNANCY SURGERY  1982   ESOPHAGOGASTRODUODENOSCOPY (EGD) WITH PROPOFOL N/A 05/08/2022    Procedure: ESOPHAGOGASTRODUODENOSCOPY (EGD) WITH BIOPSY;  Surgeon: Toney Reil, MD;  Location: [facility] SURGERY CNTR;  Service: Endoscopy;  Laterality: N/A;   FOOT SURGERY Left 2017   fusion on big toe, hammer toe, bunions   HAMMER TOE SURGERY Left 03/22/2015   Procedure: HAMMER TOE CORRECTION L 2ND AND 3RD ;  Surgeon: Recardo Evangelist, DPM;  Location: [facility] SURGERY CNTR;  Service: Podiatry;  Laterality: Left;  WITH LOCAL   HERNIA REPAIR Right 1988   inguinal   LAMINECTOMY  1993, 1994   NECK SURGERY  2006   fusion   RECTOCELE REPAIR N/A 08/25/2016   Procedure: POSTERIOR REPAIR (RECTOCELE);  Surgeon: Herold Harms, MD;  Location: [facility] ORS;  Service: Gynecology;  Laterality: N/A;   SPINAL CORD STIMULATOR BATTERY EXCHANGE Right 04/08/2019   Procedure: SPINAL CORD STIMULATOR BATTERY EXCHANGE;  Surgeon: Odette Fraction, MD;  Location: [facility] OR;  Service: Neurosurgery;  Laterality: Right;  SPINAL CORD STIMULATOR BATTERY EXCHANGE   SPINAL CORD STIMULATOR IMPLANT  2014   stimulator in the ileo inguinal nerve due to scar tissue in abdomen   TONSILLECTOMY  1985   [place]OTAL ABDOMINAL HYSTERECTOMY      [place]   VENTRAL HERNIA REPAIR N/A 09/01/2018   6.4 cm Ventra lite mesh. HERNIA REPAIR VENTRAL ADULT;  Surgeon: Earline Mayotte, MD;  Location: [facility] ORS;  Service: General;  Laterality: N/A;   Social History:  reports that she has never smoked. She has never used smokeless tobacco. She reports that she does not currently use alcohol. She reports that she does not use drugs.  Allergies  Allergen Reactions   Hydrocodone Itching    Even in cough syrup causes all body itching   Lisinopril Cough   Mirtazapine     Weight gain   Other Nausea And Vomiting    general anesthesia from 1990   Family History  Problem Relation Age of Onset   Diabetes Father    Hypertension Father    CAD Father    Diabetes Sister        non-Insulin dependent diabetes mellitus   Heart disease Brother    Cancer  Daughter        cancer of the cervix at age 62-18   Heart disease Brother    Breast cancer Paternal Aunt    Colon cancer Paternal Uncle    Ovarian cancer Neg Hx    Family history: Family history reviewed and not pertinent.  Prior to Admission medications   Medication Sig Start Date End Date Taking? Authorizing Provider  aspirin EC 81 MG tablet Take 81 mg by mouth at bedtime.   Yes [provider]  buPROPion (WELLBUTRIN SR) 150 MG 12 hr tablet TAKE 1 TABLET BY MOUTH 3 TIMES  DAILY 09/04/22  Yes Malva Limes, MD  Cholecalciferol (VITAMIN D3) 5000 UNITS CAPS Take 5,000 Units by mouth daily.  Yes [provider]  Coenzyme Q10 200 MG capsule Take 200 mg by mouth daily.   Yes [provider]  cyclobenzaprine (FLEXERIL) 10 MG tablet Take 1 tablet (10 mg total) by mouth 3 (three) times daily as needed for muscle spasms. 10/01/22  Yes Malva Limes, MD  diazepam (VALIUM) 10 MG tablet TAKE 1 TABLET BY MOUTH EVERY 6 HOURS AS NEEDED FOR ANXIETY. 06/13/22  Yes Merita Norton T, FNP  diclofenac Sodium (VOLTAREN) 1 % GEL SMARTSIG:1 Gram(s) Topical 4 Times Daily PRN 04/24/21  Yes Malva Limes, MD  estrogens, conjugated, (PREMARIN) 0.45 MG tablet Take 1 tablet (0.45 mg total) by mouth daily. Take daily for 21 days then do not take for 7 days. 05/12/23  Yes Hildred Laser, MD  fluticasone (FLONASE) 50 MCG/ACT nasal spray Place 1 spray into both nostrils daily as needed for allergies or rhinitis.   Yes [provider]  lidocaine (LIDODERM) 5 % APPLY 1 PATCH TOPICALLY TO SKIN  DAILY MAY BE LEFT ON UP TO 12  HOURS WITHIN A 24 HOUR PERIOD AS DIRECTED BY MD 03/17/23  Yes Malva Limes, MD  meloxicam (MOBIC) 15 MG tablet TAKE 1 TABLET BY MOUTH AT  BEDTIME AS NEEDED FOR PAIN 11/27/22  Yes Malva Limes, MD  Multiple Vitamin (MULTIVITAMIN WITH MINERALS) TABS tablet Take 1 tablet by mouth daily.   Yes [provider]  MYRBETRIQ 25 MG TB24 tablet TAKE 1 TABLET BY  MOUTH DAILY 11/27/22  Yes Malva Limes, MD  omeprazole (PRILOSEC) 40 MG capsule Take 1 capsule (40 mg total) by mouth 2 (two) times daily before a meal. 04/23/23 07/22/23 Yes Vanga, Loel Dubonnet, MD  oxyCODONE (ROXICODONE) 15 MG immediate release tablet Take 1-2 tablets (15-30 mg total) by mouth every 4 (four) hours as needed for pain. 04/29/23  Yes Malva Limes, MD  phentermine 15 MG capsule Take 1 capsule (15 mg total) by mouth every morning. 05/12/23  Yes Hildred Laser, MD  pramipexole (MIRAPEX) 0.25 MG tablet Take 2 tablets (0.5 mg total) by mouth at bedtime. 03/31/23  Yes Malva Limes, MD  pregabalin (LYRICA) 150 MG capsule Take 1 capsule (150 mg total) by mouth 2 (two) times daily. 03/23/23  Yes Malva Limes, MD  RESTASIS 0.05 % ophthalmic emulsion Place 1 drop into both eyes 2 (two) times daily.  11/21/15  Yes [provider]  rosuvastatin (CRESTOR) 10 MG tablet Take 1 tablet (10 mg total) by mouth daily. 03/23/23  Yes Malva Limes, MD  sertraline (ZOLOFT) 50 MG tablet TAKE 1 TABLET BY MOUTH DAILY 03/11/23  Yes Malva Limes, MD  linaclotide [facility]) 290 MCG CAPS capsule Take 1 capsule (290 mcg total) by mouth daily before breakfast. 05/20/23   Toney Reil, MD   Physical Exam: Vitals:   05/20/23 0950 05/20/23 1223 05/20/23 1400 05/20/23 1404  BP: 98/79 97/81 94/71    Pulse: (!) 121 (!) 146    Resp:  (!) 21  17  Temp:  98.5 F (36.9 C)    TempSrc:  Oral    SpO2: 98% 98%    Weight:      Height:       Constitutional: appears younger than chronological age, NAD, calm, + sleepy Eyes: PERRL, lids and conjunctivae normal ENMT: Mucous membranes are moist. Posterior pharynx clear of any exudate or lesions. Age-appropriate dentition. Hearing appropriate Neck: normal, supple, no masses, no thyromegaly Respiratory: clear to auscultation bilaterally, no wheezing, no crackles. Normal respiratory  effort. No accessory muscle use.  Cardiovascular: irregular rate and  rhythm, no murmurs / rubs / gallops. No extremity edema. 2+ pedal pulses. No carotid bruits.  Abdomen: no tenderness, no masses palpated, no hepatosplenomegaly. Bowel sounds positive.  Musculoskeletal: no clubbing / cyanosis. No joint deformity upper and lower extremities. Good ROM, no contractures, no atrophy. Normal muscle tone.  Skin: no rashes, lesions, ulcers. No induration Neurologic: Sensation intact. Strength 5/5 in all 4.  Psychiatric: Normal judgment and insight. Alert and oriented x 3. Normal mood.   EKG: independently reviewed, showing atrial fibrillation with rate of 129, QTc 506  Chest x-ray on Admission: I personally reviewed and I agree with radiologist reading as below.  DG Chest Portable 1 View  Result Date: 05/20/2023 CLINICAL DATA:  Atrial fibrillation EXAM: PORTABLE CHEST 1 VIEW COMPARISON:  Chest radiograph dated 02/18/2018 FINDINGS: Normal lung volumes. No focal consolidations. No pleural effusion or pneumothorax. The heart size and mediastinal contours are within normal limits. Cervical spinal fixation hardware appears intact. Old right clavicle fracture. IMPRESSION: No active disease. Electronically Signed   By: Agustin Cree M.D.   On: 05/20/2023 14:08    Labs on Admission: I have personally reviewed following labs  CBC: Recent Labs  Lab 05/20/23 1027  WBC 6.5  HGB 13.1  HCT 40.9  MCV 77.2*  PLT 325   Basic Metabolic Panel: Recent Labs  Lab 05/20/23 1027  NA 138  K 3.3*  CL 105  CO2 24  GLUCOSE 104*  BUN 12  CREATININE 1.00  CALCIUM 8.3*  MG 2.4   GFR: Estimated Creatinine Clearance: 62.3 mL/min (by C-G formula based on SCr of 1 mg/dL).  Thyroid Function Tests: Recent Labs    05/20/23 1239  TSH 3.831   This document was prepared using Dragon Voice Recognition software and may include unintentional dictation errors.  Dr. Sedalia Muta [facility]  If 7PM-7AM, please contact overnight-coverage provider If 7AM-7PM, please contact day attending  provider [email or web address]  05/20/2023, 3:44 PM

## 2023-05-20 NOTE — Telephone Encounter (Signed)
-----   Message from [facility] sent at 05/20/2023 10:17 AM EST ----- Regarding: Linzess Please send in prescription for linzess daily for 90days  RV

## 2023-05-20 NOTE — Consult Note (Signed)
Cardiology Consultation   Patient ID: Courtney Ross MRN: [id number]; DOB: [birth year]  Admit date: 05/20/2023 Date of Consult: 05/20/2023  PCP:  Malva Limes, MD   [facility] Providers Cardiologist:new to [facility] Physician requesting consult: Darleene Cleaver Reason for consult: Atrial fibrillation with RVR  Patient Profile:   Courtney Ross is a 67 y.o. female with a hx of dysphagia, chronic GERD, no prior cardiac history presenting for colonoscopy, during procedure developing atrial fibrillation with RVR rate greater than 120 bpm Cardiology consulted for atrial fibrillation  History of Present Illness:   Courtney Ross reports that she has been in her usual state of health, denies significant cardiac history but does report occasional palpitations, nothing significant Presenting today for colonoscopy which was uneventful apart from developing atrial fibrillation during the procedure rate greater than 120 bpm Reports that she is relatively asymptomatic, denies chest pain or shortness of breath Blood pressure stable In recovery was given Lopressor 5 mg x 2 with no significant improvement in rate EKG ordered confirming atrial fibrillation rate in the 120s Lab work ordered Echocardiogram ordered  She denies prior history of tachypalpitations concerning for atrial fibrillation    Past Medical History:  Diagnosis Date   Anxiety    Atypical squamous cells of undetermined significance on cytologic smear of vagina (ASC-US) 11/22/2014   Back pain    Colon polyp    Constipation    Degenerative disc disease, lumbar    Depression    Family history of adverse reaction to anesthesia    daughter nausea   Fatty liver    Fatty liver disease, nonalcoholic    GERD (gastroesophageal reflux disease)    H/O spinal fusion    Headache    migraines   Headache, migraine 05/08/2015   Hepatitis    in the 1970s.  Resolved.   High cholesterol    History of  frequent ear infections in childhood 11/22/2014   Overview:   DID have Chicken Pox. DID have Measles. DID have Mumps. DID have Scarlet Fever.      Formatting of this note might be different from the original.  DID have Chicken Pox. DID have Measles. DID have Mumps. DID have Scarlet Fever.   Hypertension    Lower back pain    Polycystic ovarian disease    PONV (postoperative nausea and vomiting)    due to surgery from the 1990's   Restless leg syndrome    Spasm 01/29/2009   Spinal cord stimulator status    Wears dentures    full upper, partial lower    Past Surgical History:  Procedure Laterality Date   APPENDECTOMY  1988   ARTHRODESIS METATARSALPHALANGEAL JOINT (MTPJ) Left 12/14/2014   Procedure: ARTHRODESIS METATARSALPHALANGEAL JOINT (MTPJ) 1ST;  Surgeon: Recardo Evangelist, DPM;  Location: [facility];  Service: Podiatry;  Laterality: Left;  LMA   back fusion  2004   lumbar region. had broken facet joint.    BACK SURGERY  [phone]   x3   BLADDER SUSPENSION  2006   BREAST BIOPSY Left 1990's   core Sankar   BREAST SURGERY  2005   lumpectomy   CARPAL TUNNEL RELEASE Bilateral 02/26/07,05/21/07   CATARACT EXTRACTION W/PHACO Right 02/11/2021   Procedure: CATARACT EXTRACTION PHACO AND INTRAOCULAR LENS PLACEMENT (IOC) RIGHT PANOPTIX LENS;  Surgeon: Nevada Crane, MD;  Location: [facility];  Service: Ophthalmology;  Laterality: Right;  PANOPTIX LENS 1.92 00:17.8   CATARACT EXTRACTION W/PHACO Left 03/04/2021   Procedure: CATARACT  EXTRACTION PHACO AND INTRAOCULAR  LENS PLACEMENT (IOC) LEFT PANOPTIX LENS 2.87 00:30.0;  Surgeon: Nevada Crane, MD;  Location: [facility];  Service: Ophthalmology;  Laterality: Left;  PANOPTIX LENS   CHOLECYSTECTOMY N/A 05/03/2018   Procedure: LAPAROSCOPIC CHOLECYSTECTOMY WITH INTRAOPERATIVE CHOLANGIOGRAM;  Surgeon: Earline Mayotte, MD;  Location: [facility] OR[facility];  Service: General;  Laterality: N/A;   COLONOSCOPY     COLONOSCOPY  WITH PROPOFOL N/A 01/10/2020   Procedure: COLONOSCOPY WITH PROPOFOL;  Surgeon: Wyline Mood, MD;  Location[facility];  Service: Gastroenterology;  Laterality: N/A;   COLPORRHAPHY     DILATION AND CURETTAGE OF UTERUS     ECTOPIC PREGNANCY SURGERY  1982   ESOPHAGOGASTRODUODENOSCOPY (EGD) WITH PROPOFOL N/A 05/08/2022   Procedure: ESOPHAGOGASTRODUODENOSCOPY (EGD) WITH BIOPSY;  Surgeon: Toney Reil, MD;  Location: [facility];  Service: Endoscopy;  Laterality: N/A;   FOOT SURGERY Left 2017   fusion on big toe, hammer toe, bunions   HAMMER TOE SURGERY Left 03/22/2015   Procedure: HAMMER TOE CORRECTION L 2ND AND 3RD ;  Surgeon: Recardo Evangelist, DPM;  Location: [facility];  Service: Podiatry;  Laterality: Left;  WITH LOCAL   HERNIA REPAIR Right 1988   inguinal   LAMINECTOMY  1993, 1994   NECK SURGERY  2006   fusion   RECTOCELE REPAIR N/A 08/25/2016   Procedure: POSTERIOR REPAIR (RECTOCELE);  Surgeon: Herold Harms, MD;  Location: [facility] ORS;  Service: Gynecology;  Laterality: N/A;   SPINAL CORD STIMULATOR BATTERY EXCHANGE Right 04/08/2019   Procedure: SPINAL CORD STIMULATOR BATTERY EXCHANGE;  Surgeon: Odette Fraction, MD;  Location: [facility] OR;  Service: Neurosurgery;  Laterality: Right;  SPINAL CORD STIMULATOR BATTERY EXCHANGE   SPINAL CORD STIMULATOR IMPLANT  2014   stimulator in the ileo inguinal nerve due to scar tissue in abdomen   TONSILLECTOMY  1985   [place]   VENTRAL HERNIA REPAIR N/A 09/01/2018   6.4 cm Ventra lite mesh. HERNIA REPAIR VENTRAL ADULT;  Surgeon: Earline Mayotte, MD;  Location: [facility] OR[facility];  Service: General;  Laterality: N/A;     Home Medications:  Prior to Admission medications   Medication Sig Start Date End Date Taking? Authorizing Provider  aspirin EC 81 MG tablet Take 81 mg by mouth at bedtime.   Yes [provider]  buPROPion (WELLBUTRIN SR) 150 MG 12 hr tablet TAKE 1 TABLET BY MOUTH 3 TIMES  DAILY  09/04/22  Yes Malva Limes, MD  Cholecalciferol (VITAMIN D3) 5000 UNITS CAPS Take 5,000 Units by mouth daily.    Yes [provider]  Coenzyme Q10 200 MG capsule Take 200 mg by mouth daily.   Yes [provider]  cyclobenzaprine (FLEXERIL) 10 MG tablet Take 1 tablet (10 mg total) by mouth 3 (three) times daily as needed for muscle spasms. 10/01/22  Yes Malva Limes, MD  diazepam (VALIUM) 10 MG tablet TAKE 1 TABLET BY MOUTH EVERY 6 HOURS AS NEEDED FOR ANXIETY. 06/13/22  Yes Merita Norton T, FNP  diclofenac Sodium (VOLTAREN) 1 % GEL SMARTSIG:1 Gram(s) Topical 4 Times Daily PRN 04/24/21  Yes Malva Limes, MD  estrogens, conjugated, (PREMARIN) 0.45 MG tablet Take 1 tablet (0.45 mg total) by mouth daily. Take daily for 21 days then do not take for 7 days. 05/12/23  Yes Hildred Laser, MD  fluticasone (FLONASE) 50 MCG/ACT nasal spray Place 1 spray into both nostrils daily as needed for allergies or rhinitis.  Yes [provider]  lidocaine (LIDODERM) 5 % APPLY 1 PATCH TOPICALLY TO SKIN  DAILY MAY BE LEFT ON UP TO 12  HOURS WITHIN A 24 HOUR PERIOD AS DIRECTED BY MD 03/17/23  Yes Malva Limes, MD  meloxicam (MOBIC) 15 MG tablet TAKE 1 TABLET BY MOUTH AT  BEDTIME AS NEEDED FOR PAIN 11/27/22  Yes Malva Limes, MD  Multiple Vitamin (MULTIVITAMIN WITH MINERALS) TABS tablet Take 1 tablet by mouth daily.   Yes [provider]  MYRBETRIQ 25 MG TB24 tablet TAKE 1 TABLET BY MOUTH DAILY 11/27/22  Yes Malva Limes, MD  omeprazole (PRILOSEC) 40 MG capsule Take 1 capsule (40 mg total) by mouth 2 (two) times daily before a meal. 04/23/23 07/22/23 Yes Vanga, Loel Dubonnet, MD  oxyCODONE (ROXICODONE) 15 MG immediate release tablet Take 1-2 tablets (15-30 mg total) by mouth every 4 (four) hours as needed for pain. 04/29/23  Yes Malva Limes, MD  phentermine 15 MG capsule Take 1 capsule (15 mg total) by mouth every morning. 05/12/23  Yes Hildred Laser, MD  pramipexole  (MIRAPEX) 0.25 MG tablet Take 2 tablets (0.5 mg total) by mouth at bedtime. 03/31/23  Yes Malva Limes, MD  pregabalin (LYRICA) 150 MG capsule Take 1 capsule (150 mg total) by mouth 2 (two) times daily. 03/23/23  Yes Malva Limes, MD  RESTASIS 0.05 % ophthalmic emulsion Place 1 drop into both eyes 2 (two) times daily.  11/21/15  Yes [provider]  rosuvastatin (CRESTOR) 10 MG tablet Take 1 tablet (10 mg total) by mouth daily. 03/23/23  Yes Malva Limes, MD  sertraline (ZOLOFT) 50 MG tablet TAKE 1 TABLET BY MOUTH DAILY 03/11/23  Yes Malva Limes, MD  linaclotide [facility]) 290 MCG CAPS capsule Take 1 capsule (290 mcg total) by mouth daily before breakfast. 05/20/23   Toney Reil, MD    Inpatient Medications: Scheduled Meds:  Continuous Infusions:  diltiazem (CARDIZEM) infusion 5 mg/hr (05/20/23 1217)   sodium chloride     PRN Meds:   Allergies:    Allergies  Allergen Reactions   Hydrocodone Itching    Even in cough syrup causes all body itching   Lisinopril Cough   Mirtazapine     Weight gain   Other Nausea And Vomiting    general anesthesia from 1990    Social History:   Social History   Socioeconomic History   Marital status: Divorced    Spouse name: Not on file   Number of children: 4   Years of education: Not on file   Highest education level: 9th grade  Occupational History    Comment: disabled  Tobacco Use   Smoking status: Never   Smokeless tobacco: Never  Vaping Use   Vaping status: Never Used  Substance and Sexual Activity   Alcohol use: Not Currently    Comment: rare   Drug use: No   Sexual activity: Not Currently    Birth control/protection: Surgical  Other Topics Concern   Not on file  Social History Narrative   Pt is currently receiving assistance from [employer] and gets food stamps.    Social Determinants of Health   Financial Resource Strain: Medium Risk (11/13/2022)   Overall Financial Resource Strain (CARDIA)     Difficulty of Paying Living Expenses: Somewhat hard  Food Insecurity: Unknown (11/13/2022)   Hunger Vital Sign    Worried About Running Out of Food in the Last Year: Never true  Ran Out of Food in the Last Year: Patient declined  Transportation Needs: Patient Declined (11/13/2022)   PRAPARE - Administrator, Civil Service (Medical): Patient declined    Lack of Transportation (Non-Medical): Patient declined  Physical Activity: Insufficiently Active (11/13/2022)   Exercise Vital Sign    Days of Exercise per Week: 2 days    Minutes of Exercise per Session: 60 min  Stress: No Stress Concern Present (11/13/2022)   [employer] of Occupational Health - Occupational Stress Questionnaire    Feeling of Stress : Only a little  Social Connections: Moderately Integrated (11/13/2022)   Social Connection and Isolation Panel [NHANES]    Frequency of Communication with Friends and Family: Three times a week    Frequency of Social Gatherings with Friends and Family: Once a week    Attends Religious Services: More than 4 times per year    Active Member of [employer] or Organizations: Yes    Attends Banker Meetings: 1 to 4 times per year    Marital Status: Divorced  Intimate Partner Violence: Not At Risk (08/14/2022)   Humiliation, Afraid, Rape, and Kick questionnaire    Fear of Current or Ex-Partner: No    Emotionally Abused: No    Physically Abused: No    Sexually Abused: No    Family History:    Family History  Problem Relation Age of Onset   Diabetes Father    Hypertension Father    CAD Father    Diabetes Sister        non-Insulin dependent diabetes mellitus   Heart disease Brother    Cancer Daughter        cancer of the cervix at age 19-18   Heart disease Brother    Breast cancer Paternal Aunt    Colon cancer Paternal Uncle    Ovarian cancer Neg Hx      ROS:  Please see the history of present illness.  Review of Systems  Constitutional: Negative.   HENT:  Negative.    Respiratory: Negative.    Cardiovascular: Negative.   Gastrointestinal: Negative.   Musculoskeletal: Negative.   Neurological: Negative.   Psychiatric/Behavioral: Negative.    All other systems reviewed and are negative.    Physical Exam/Data:   Vitals:   05/20/23 0850 05/20/23 0905 05/20/23 0907 05/20/23 0950  BP: (!) 116/95 125/73  98/79  Pulse: (!) 132 (!) 141 (!) 119 (!) 121  Resp:      Temp:      TempSrc:      SpO2: 98% 98%  98%  Weight:      Height:        Intake/Output Summary (Last 24 hours) at 05/20/2023 1218 Last data filed at 05/20/2023 0834 Gross per 24 hour  Intake 250 ml  Output 0 ml  Net 250 ml      05/20/2023    7:07 AM 05/12/2023    9:26 AM 04/20/2023    2:36 PM  Last 3 Weights  Weight (lbs) 187 lb 191 lb 12.8 oz 189 lb 2 oz  Weight (kg) 84.823 kg 87 kg 85.787 kg     Body mass index is 28.43 kg/m.  General:  Well nourished, well developed, in no acute distress HEENT: normal Neck: no JVD Vascular: No carotid bruits; Distal pulses 2+ bilaterally Cardiac: Irregularly irregular, rapid no murmur  Lungs:  clear to auscultation bilaterally, no wheezing, rhonchi or rales  Abd: soft, nontender, no hepatomegaly  Ext: no edema Musculoskeletal:  No deformities, BUE and BLE strength normal and equal Skin: warm and dry  Neuro:  CNs 2-12 intact, no focal abnormalities noted Psych:  Normal affect   EKG:  The EKG was personally reviewed and demonstrates:   Atrial fibrillation with rate 120s, no significant ST-T wave changes  Telemetry:  Telemetry was personally reviewed and demonstrates:   Atrial fibrillation rate 116 up to 130  Relevant CV Studies: Echocardiogram pending  Laboratory Data:  High Sensitivity Troponin:  No results for input(s): "TROPONINIHS" in the last 720 hours.   Chemistry Recent Labs  Lab 05/20/23 1027  NA 138  K 3.3*  CL 105  CO2 24  GLUCOSE 104*  BUN 12  CREATININE 1.00  CALCIUM 8.3*  MG 2.4  GFRNONAA  >60  ANIONGAP 9    No results for input(s): "PROT", "ALBUMIN", "AST", "ALT", "ALKPHOS", "BILITOT" in the last 168 hours. Lipids No results for input(s): "CHOL", "TRIG", "HDL", "LABVLDL", "LDLCALC", "CHOLHDL" in the last 168 hours.  Hematology Recent Labs  Lab 05/20/23 1027  WBC 6.5  RBC 5.30*  HGB 13.1  HCT 40.9  MCV 77.2*  MCH 24.7*  MCHC 32.0  RDW 14.6  PLT 325   Thyroid No results for input(s): "TSH", "FREET4" in the last 168 hours.  BNPNo results for input(s): "BNP", "PROBNP" in the last 168 hours.  DDimer No results for input(s): "DDIMER" in the last 168 hours.   Radiology/Studies:  No results found.   Assessment and Plan:  Courtney Ross is a 67 year old woman with no prior cardiac history developing atrial fibrillation during colonoscopy  --Atrial fibrillation with RVR Exact timing of onset unclear though presumably during colonoscopy Heart rate prior to procedure 76 bpm asymptomatic Heart rate in atrial fibrillation 120s which has persisted post procedure despite Lopressor bolus x 2 -Not a candidate for anticoagulation given recent polypectomy, will need to hold off at least 24 hours -Will start diltiazem bolus 10 mg IV push with infusion 5 up to 15 mg/h for rate control -Echocardiogram ordered, lab work ordered  --GERD, s/p  colonoscopy Polypectomy (several) Will avoid anticoagulation for at least 24 hours  On discussion with anesthesia, nursing, case discussed with ER on-call physician.  Discussion with patient concerning management of atrial fibrillation   For questions or updates, please contact [facility] Please consult [email or web address] for contact info under    Signed, Julien Nordmann, MD  05/20/2023 12:18 PM

## 2023-05-20 NOTE — Assessment & Plan Note (Signed)
>>  ASSESSMENT AND PLAN FOR CHRONIC PAIN SYNDROME WRITTEN ON 05/20/2023  3:17 PM BY COX, AMY N, DO  PDMP reviewed Home oxycodone  50 mg p.o. every 6 hours as needed for severe pain resumed Home Flexeril  10 mg 3 times daily as needed for muscle spasms resume

## 2023-05-20 NOTE — Assessment & Plan Note (Addendum)
Labetalol 5 mg IV every 3 hours as needed for SBP > 160, 4 doses ordered

## 2023-05-20 NOTE — Transfer of Care (Signed)
Immediate Anesthesia Transfer of Care Note  Patient: Courtney Ross  Procedure(s) Performed: COLONOSCOPY WITH PROPOFOL POLYPECTOMY  Patient Location: Endoscopy Unit  Anesthesia Type:General  Level of Consciousness: sedated and drowsy  Airway & Oxygen Therapy: Patient Spontanous Breathing  Post-op Assessment: Report given to RN and Post -op Vital signs reviewed and stable  Post vital signs: Reviewed and stable  Last Vitals:  Vitals Value Taken Time  BP 112/68 05/20/23 0830  Temp    Pulse 118 05/20/23 0830  Resp 22 05/20/23 0830  SpO2 98 % 05/20/23 0830    Last Pain:  Vitals:   05/20/23 0707  TempSrc: Temporal  PainSc: 0-No pain         Complications: No notable events documented.

## 2023-05-21 DIAGNOSIS — I10 Essential (primary) hypertension: Secondary | ICD-10-CM

## 2023-05-21 DIAGNOSIS — I4891 Unspecified atrial fibrillation: Secondary | ICD-10-CM

## 2023-05-21 DIAGNOSIS — G894 Chronic pain syndrome: Secondary | ICD-10-CM

## 2023-05-21 DIAGNOSIS — E785 Hyperlipidemia, unspecified: Secondary | ICD-10-CM

## 2023-05-21 LAB — ECHOCARDIOGRAM COMPLETE
Area-P 1/2: 2.83 cm2
Height: 68 in
S' Lateral: 2.9 cm
Weight: 2992 [oz_av]

## 2023-05-21 LAB — BASIC METABOLIC PANEL
Anion gap: 7 (ref 5–15)
BUN: 16 mg/dL (ref 8–23)
CO2: 26 mmol/L (ref 22–32)
Calcium: 8.7 mg/dL — ABNORMAL LOW (ref 8.9–10.3)
Chloride: 106 mmol/L (ref 98–111)
Creatinine, Ser: 1.08 mg/dL — ABNORMAL HIGH (ref 0.44–1.00)
GFR, Estimated: 56 mL/min — ABNORMAL LOW (ref >60)
Glucose, Bld: 115 mg/dL — ABNORMAL HIGH (ref 70–99)
Potassium: 4.4 mmol/L (ref 3.5–5.1)
Sodium: 139 mmol/L (ref 135–145)

## 2023-05-21 LAB — CBC
HCT: 38 % (ref 36.0–46.0)
Hemoglobin: 12.2 g/dL (ref 12.0–15.0)
MCH: 24.7 pg — ABNORMAL LOW (ref 26.0–34.0)
MCHC: 32.1 g/dL (ref 30.0–36.0)
MCV: 77.1 fL — ABNORMAL LOW (ref 80.0–100.0)
Platelets: 299 10*3/uL (ref 150–400)
RBC: 4.93 MIL/uL (ref 3.87–5.11)
RDW: 14.6 % (ref 11.5–15.5)
WBC: 5.8 10*3/uL (ref 4.0–10.5)
nRBC: 0 % (ref 0.0–0.2)

## 2023-05-21 MED ORDER — METOPROLOL SUCCINATE ER 25 MG PO TB24
12.5000 mg | ORAL_TABLET | Freq: Every day | ORAL | Status: DC
  Administered 2023-05-21: 12.5 mg via ORAL
  Filled 2023-05-21: qty 1

## 2023-05-21 MED ORDER — METOPROLOL SUCCINATE ER 25 MG PO TB24: 12.5000 mg | ORAL_TABLET | Freq: Every day | ORAL | 0 refills | Status: DC

## 2023-05-21 NOTE — Progress Notes (Signed)
   Patient Name: Courtney Ross Date of Encounter: 05/21/2023 [facility] Cardiologist: None  New consult done by Dr Mariah Milling  Interval Summary  .    Patient seen on AM rounds. Denies any chest pain or shortness of breath. Remained in sinus overnight on telemetry.   Vital Signs .    Vitals:   05/20/23 2241 05/21/23 0342 05/21/23 0726 05/21/23 1000  BP: (!) 119/55 123/65 124/70   Pulse: 64 61 (!) 58 83  Resp: 17 18 18 18   Temp: 98.3 F (36.8 C) 98.2 F (36.8 C) 98.6 F (37 C)   TempSrc: Oral  Oral   SpO2: 99% 96% 96%   Weight:      Height:        Intake/Output Summary (Last 24 hours) at 05/21/2023 1026 Last data filed at 05/20/2023 1654 Gross per 24 hour  Intake 569.78 ml  Output --  Net 569.78 ml      05/20/2023    7:07 AM 05/12/2023    9:26 AM 04/20/2023    2:36 PM  Last 3 Weights  Weight (lbs) 187 lb 191 lb 12.8 oz 189 lb 2 oz  Weight (kg) 84.823 kg 87 kg 85.787 kg      Telemetry/ECG    Sinus brady to sinus rates 50-60 - Personally Reviewed  Physical Exam .   GEN: No acute distress.   Neck: No JVD Cardiac: RRR, no murmurs, rubs, or gallops.  Respiratory: Clear to auscultation bilaterally. GI: Soft, nontender, non-distended  MS: No edema  Assessment & Plan .     New onset atrial fibrillation with RVR -Presented for colonoscopy exact timing of onset is unclear though presumably during procedure -Heart rate prior to procedure was 76 bpm and she was asymptomatic -Postprocedure she was in atrial fibrillation with rates of 120s that persisted despite Lopressor bolus x 2, also had some bolus and drip -Not a candidate for current anticoagulation given recent polypectomy will need to hold off for at least 24 hours -with first episode during recent colonoscopy without a prior history was not started on OAC -Converted to sinus rhythm and maintained sinus rhythm overnight -Echocardiogram was ordered but can be completed in the outpatient  setting -Previously was started on metoprolol tartrate 12.5 mg twice daily this has been discontinued and changed to Toprol-XL 12.5 mg due to bradycardia -Patient has been encouraged to monitor heart rate with her Apple Watch and she will be sent a ZIO XT monitor for 14 days to reevaluate atrial fibrillation determine burden -TSH 3.831 -Potassium 4.4 and Mg 2.4 -continued on telemetry while inpatient  Hyperlipidemia -continued on statin therapy  Hypertension -blood pressure 124/70 -continue on Toprol XL 12.5 mg daily -vital signs per unit protocol  GERD status post colonoscopy -Polypectomy several during procedure -Anticoagulation being avoided for minimum of 24 hours post procedure   CHA2DS2-VASc Score = 3   This indicates a 3.2% annual risk of stroke. The patient's score is based upon: CHF History: 0 HTN History: 1 Diabetes History: 0 Stroke History: 0 Vascular Disease History: 0 Age Score: 1 Gender Score: 1     For questions or updates, please contact [facility] Please consult [email or web address] for contact info under        Signed, Lamiah Marmol, NP

## 2023-05-21 NOTE — Progress Notes (Signed)
Transition of Care [facility]) - Inpatient Brief Assessment   Patient Details  Name: Courtney Ross MRN: [id number] Date of Birth: September 13, 1955  [facility] CM/SW Contact:    Truddie Hidden, RN Phone Number: 05/21/2023, 9:52 AM   Clinical Narrative: TOC continuing to follow patient's progress throughout discharge planning.   Transition of Care Asessment: Insurance and Status: Insurance coverage has been reviewed Patient has primary care physician: Yes Home environment has been reviewed: Home Prior level of function:: independent Prior/Current Home Services: No current home services Social Determinants of Health Reivew: SDOH reviewed no interventions necessary Readmission risk has been reviewed: Yes Transition of care needs: no transition of care needs at this time

## 2023-05-21 NOTE — Discharge Summary (Signed)
Physician Discharge Summary   Patient: Courtney Ross MRN: [id number] DOB: 1955/07/21  Admit date:     05/20/2023  Discharge date: 05/21/23  Discharge Physician: Marrion Coy   PCP: Malva Limes, MD   Recommendations at discharge:   Follow-up with PCP in 1 week. Follow-up with cardiology in 2 weeks.  Discharge Diagnoses: Principal Problem:   Atrial fibrillation with RVR (HCC) Active Problems:   Chronic lumbar radicular pain (Right side)   Anxiety   Dyslipidemia   Major depressive disorder with single episode, in partial remission (HCC)   Restless legs syndrome   Essential hypertension   Menopause   Chronic pain syndrome   Chronic GERD   Erosive esophagitis   Polyp of descending colon   Polyp of transverse colon   Hx of colonic polyps Overweight with BMI 28.43 Resolved Problems:   * No resolved hospital problems. * Hypokalemia. Hospital Course: 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  Patient is status post colonoscopy and polypectomy and was found to be in A-fib with RVR during recovery.   Patient was admitted to the hospital, received IV diltiazem drip, she has converted to sinus rhythm today.  She is asymptomatic.  Discussed with Dr. Mariah Milling, medically stable for discharge with lower dose of metoprolol.  Assessment and Plan: New onset atrial fibrillation with RVR (HCC) Patient has converted to sinus rhythm today.  Discussed with Dr. Mariah Milling, due to A-fib associated with procedure, anticoagulation will be not started at this time, however, cardiology will place patient on ZIO recorder and follow-up in the future decide if patient need to be on  anticoagulation. Patient currently medically stable for discharge.  Hx of colonic polyps Status post colonoscopy with removal on 05/20/2023  Chronic GERD Resume home medicines.  Chronic pain syndrome Resume home medicines.  Essential hypertension Resume home medicines.  Major depressive disorder with single episode, in partial remission (HCC)\ Anxiety. Resume home medicines.  Dyslipidemia Rosuvastatin 10 mg daily resumed         Consultants: Cardiology Procedures performed: None  Disposition: Home Diet recommendation:  Discharge Diet Orders (From admission, onward)     Start     Ordered   05/21/23 0000  Diet - low sodium heart healthy        05/21/23 0945           Cardiac diet DISCHARGE MEDICATION: Allergies as of 05/21/2023       Reactions   Hydrocodone Itching   Even in cough syrup causes all body itching   Lisinopril Cough   Mirtazapine    Weight gain   Other Nausea And Vomiting   general anesthesia from 1990        Medication List     TAKE these medications    aspirin EC 81 MG tablet Take 81 mg by mouth at bedtime.   buPROPion 150 MG 12 hr tablet Commonly known as: WELLBUTRIN SR TAKE 1 TABLET BY MOUTH 3 TIMES  DAILY   Coenzyme Q10 200 MG capsule Take 200 mg by mouth daily.   cyclobenzaprine 10 MG tablet Commonly known as: FLEXERIL Take 1 tablet (10 mg total) by mouth 3 (three) times daily as needed for muscle spasms.   diazepam 10 MG tablet Commonly known  as: VALIUM TAKE 1 TABLET BY MOUTH EVERY 6 HOURS AS NEEDED FOR ANXIETY.   diclofenac Sodium 1 % Gel Commonly known as: VOLTAREN SMARTSIG:1 Gram(s) Topical 4 Times Daily PRN   estrogens (conjugated) 0.45 MG tablet Commonly known as: Premarin Take 1 tablet (0.45 mg total) by mouth daily. Take daily for 21 days then do not take for 7 days.   fluticasone 50 MCG/ACT nasal spray Commonly known as: FLONASE Place 1 spray into both nostrils daily as needed for allergies or  rhinitis.   lidocaine 5 % Commonly known as: LIDODERM APPLY 1 PATCH TOPICALLY TO SKIN  DAILY MAY BE LEFT ON UP TO 12  HOURS WITHIN A 24 HOUR PERIOD AS DIRECTED BY MD   linaclotide 290 MCG Caps capsule Commonly known as: Linzess Take 1 capsule (290 mcg total) by mouth daily before breakfast.   meloxicam 15 MG tablet Commonly known as: MOBIC TAKE 1 TABLET BY MOUTH AT  BEDTIME AS NEEDED FOR PAIN   metoprolol succinate 25 MG 24 hr tablet Commonly known as: TOPROL-XL Take 0.5 tablets (12.5 mg total) by mouth daily.   multivitamin with minerals Tabs tablet Take 1 tablet by mouth daily.   Myrbetriq 25 MG Tb24 tablet Generic drug: mirabegron ER TAKE 1 TABLET BY MOUTH DAILY   omeprazole 40 MG capsule Commonly known as: PRILOSEC Take 1 capsule (40 mg total) by mouth 2 (two) times daily before a meal.   oxyCODONE 15 MG immediate release tablet Commonly known as: ROXICODONE Take 1-2 tablets (15-30 mg total) by mouth every 4 (four) hours as needed for pain.   phentermine 15 MG capsule Take 1 capsule (15 mg total) by mouth every morning.   pramipexole 0.25 MG tablet Commonly known as: MIRAPEX Take 2 tablets (0.5 mg total) by mouth at bedtime.   pregabalin 150 MG capsule Commonly known as: LYRICA Take 1 capsule (150 mg total) by mouth 2 (two) times daily.   Restasis 0.05 % ophthalmic emulsion Generic drug: cycloSPORINE Place 1 drop into both eyes 2 (two) times daily.   rosuvastatin 10 MG tablet Commonly known as: CRESTOR Take 1 tablet (10 mg total) by mouth daily.   sertraline 50 MG tablet Commonly known as: ZOLOFT TAKE 1 TABLET BY MOUTH DAILY   Vitamin D3 125 MCG (5000 UT) Caps Take 5,000 Units by mouth daily.        Follow-up Information     Malva Limes, MD Follow up in 1 week(s).   Specialty: Family Medicine Contact information: [address] [place] [address] [phone]         Antonieta Iba, MD Follow up in 2 week(s).    Specialty: Cardiology Contact information: [address] [place] [address] [phone]                Discharge Exam: Ceasar Mons Weights   05/20/23 0707  Weight: 84.8 kg   General exam: Appears calm and comfortable  Respiratory system: Clear to auscultation. Respiratory effort normal. Cardiovascular system: S1 & S2 heard, RRR. No JVD, murmurs, rubs, gallops or clicks. No pedal edema. Gastrointestinal system: Abdomen is nondistended, soft and nontender. No organomegaly or masses felt. Normal bowel sounds heard. Central nervous system: Alert and oriented. No focal neurological deficits. Extremities: Symmetric 5 x 5 power. Skin: No rashes, lesions or ulcers Psychiatry: Judgement and insight appear normal. Mood & affect appropriate.    Condition at discharge: good  The results of significant diagnostics from this hospitalization (including  imaging, microbiology, ancillary and laboratory) are listed below for reference.   Imaging Studies: DG Chest Portable 1 View  Result Date: 05/20/2023 CLINICAL DATA:  Atrial fibrillation EXAM: PORTABLE CHEST 1 VIEW COMPARISON:  Chest radiograph dated 02/18/2018 FINDINGS: Normal lung volumes. No focal consolidations. No pleural effusion or pneumothorax. The heart size and mediastinal contours are within normal limits. Cervical spinal fixation hardware appears intact. Old right clavicle fracture. IMPRESSION: No active disease. Electronically Signed   By: Agustin Cree M.D.   On: 05/20/2023 14:08   US ABDOMEN LIMITED RUQ (LIVER/GB)  Result Date: 04/28/2023 CLINICAL DATA:  Elevated LFTs EXAM: ULTRASOUND ABDOMEN LIMITED RIGHT UPPER QUADRANT COMPARISON:  None Available. FINDINGS: Gallbladder: Surgically absent Common bile duct: Diameter: 7 mm Liver: Increased echogenicity. No focal lesion. Portal vein is patent on color Doppler imaging with normal direction of blood flow towards the liver. Other: None. IMPRESSION: 1. Status post  cholecystectomy. 2. Increased hepatic parenchymal echogenicity suggestive of steatosis. Electronically Signed   By: Annia Belt M.D.   On: 04/28/2023 12:46    Microbiology: Results for orders placed or performed during the hospital encounter of 01/09/20  SARS CORONAVIRUS 2 (TAT 6-24 HRS) Nasopharyngeal Nasopharyngeal Swab     Status: None   Collection Time: 01/09/20  8:39 AM   Specimen: Nasopharyngeal Swab  Result Value Ref Range Status   SARS Coronavirus 2 NEGATIVE NEGATIVE Final    Comment: (NOTE) SARS-CoV-2 target nucleic acids are NOT DETECTED.  The SARS-CoV-2 RNA is generally detectable in upper and lower respiratory specimens during the acute phase of infection. Negative results do not preclude SARS-CoV-2 infection, do not rule out co-infections with other pathogens, and should not be used as the sole basis for treatment or other patient management decisions. Negative results must be combined with clinical observations, patient history, and epidemiological information. The expected result is Negative.  Fact Sheet for Patients: HairSlick.no  Fact Sheet for Healthcare Providers: quierodirigir.com  This test is not yet approved or cleared by the [place] FDA and  has been authorized for detection and/or diagnosis of SARS-CoV-2 by FDA under an Emergency Use Authorization (EUA). This EUA will remain  in effect (meaning this test can be used) for the duration of the COVID-19 declaration under Se ction 564(b)(1) of the Act, 21 U.S.C. section 360bbb-3(b)(1), unless the authorization is terminated or revoked sooner.  Performed at [facility], [address], [place], [place] [address]     Labs: CBC: Recent Labs  Lab 05/20/23 1027 05/21/23 0531  WBC 6.5 5.8  HGB 13.1 12.2  HCT 40.9 38.0  MCV 77.2* 77.1*  PLT 325 299   Basic Metabolic Panel: Recent Labs  Lab 05/20/23 1027 05/21/23 0531  NA 138 139  K  3.3* 4.4  CL 105 106  CO2 24 26  GLUCOSE 104* 115*  BUN 12 16  CREATININE 1.00 1.08*  CALCIUM 8.3* 8.7*  MG 2.4  --    Liver Function Tests: No results for input(s): "AST", "ALT", "ALKPHOS", "BILITOT", "PROT", "ALBUMIN" in the last 168 hours. CBG: No results for input(s): "GLUCAP" in the last 168 hours.  Discharge time spent: greater than 30 minutes.  Signed: Marrion Coy, MD [facility] 05/21/2023

## 2023-05-21 NOTE — Care Management CC44 (Signed)
Condition Code 44 Documentation Completed  Patient Details  Name: Courtney Ross MRN: [id number] Date of Birth: April 12, 1956   Condition Code 44 given:  Yes Patient signature on Condition Code 44 notice:  Yes Documentation of 2 MD's agreement:  Yes Code 44 added to claim:  Yes    Truddie Hidden, RN 05/21/2023, 9:58 AM

## 2023-05-22 ENCOUNTER — Telehealth: Payer: Self-pay

## 2023-05-22 LAB — SURGICAL PATHOLOGY

## 2023-05-22 NOTE — Transitions of Care (Post Inpatient/ED Visit) (Signed)
   05/22/2023  Name: Irelynne Wiita MRN: [id number] DOB: Oct 15, 1955  Today's TOC FU Call Status: Today's TOC FU Call Status:: Unsuccessful Call (1st Attempt) Unsuccessful Call (1st Attempt) Date: 05/22/23  Attempted to reach the patient regarding the most recent Inpatient/ED visit.  Follow Up Plan: Additional outreach attempts will be made to reach the patient to complete the Transitions of Care (Post Inpatient/ED visit) call.   Signature  Kandis Fantasia, LPN [facility] Health Advisor [place] [facility] You Are. We Are. [facility] Direct Dial [phone]

## 2023-05-24 ENCOUNTER — Encounter: Payer: Self-pay | Admitting: Gastroenterology

## 2023-05-25 ENCOUNTER — Encounter: Payer: Self-pay | Admitting: Gastroenterology

## 2023-05-25 ENCOUNTER — Ambulatory Visit: Payer: 59 | Attending: Cardiology

## 2023-05-25 ENCOUNTER — Telehealth: Payer: Self-pay | Admitting: Cardiovascular Disease

## 2023-05-25 ENCOUNTER — Telehealth: Payer: Self-pay

## 2023-05-25 ENCOUNTER — Other Ambulatory Visit: Payer: Self-pay | Admitting: Family Medicine

## 2023-05-25 DIAGNOSIS — I4891 Unspecified atrial fibrillation: Secondary | ICD-10-CM

## 2023-05-25 NOTE — Telephone Encounter (Signed)
-----   Message from Howe K sent at 05/25/2023  9:31 AM EST -----  ----- Message ----- From: Charlsie Quest, NP Sent: 05/21/2023  11:04 AM EST To: Cv [facility] Triage; C[facility] Scheduling  Please mail patient a Zio XT monitor for 14 days to assess for atrial fibrillation. She will also need a 6 week follow appointment with myself or Dr Mariah Milling for hospital follow up.  Thanks, [employer]

## 2023-05-25 NOTE — Telephone Encounter (Signed)
Left voicemail to schedule hospital follow up appt

## 2023-05-25 NOTE — Telephone Encounter (Signed)
Monitor order placed and message sent to scheduling  Hammock, Lavonna Rua, NP  P Cv [facility] Scheduling; P Cv Div [facility] Triage Please mail patient a Zio XT monitor for 14 days to assess for atrial fibrillation. She will also need a 6 week follow appointment with myself or Dr Mariah Milling for hospital follow up.  Thanks, [employer]

## 2023-05-25 NOTE — Telephone Encounter (Signed)
Medication Refill -  Most Recent Primary Care Visit:  Provider: Malva Limes  Department: [facility]  Visit Type: OFFICE VISIT  Date: 11/14/2022  Medication: oxyCODONE (ROXICODONE) 15 MG immediate release tablet and diazepam (VALIUM) 10 MG tablet   Has the patient contacted their pharmacy? No  Is this the correct pharmacy for this prescription? Yes If no, delete pharmacy and type the correct one.  This is the patient's preferred pharmacy: [facility] - [place], [place] - [address] [place] [address] Phone: [phone] Fax: [phone]  Has the prescription been filled recently? No  Is the patient out of the medication? Yes  Has the patient been seen for an appointment in the last year OR does the patient have an upcoming appointment? Yes  Can we respond through MyChart? Yes  Agent: Please be advised that Rx refills may take up to 3 business days. We ask that you follow-up with your pharmacy.

## 2023-05-28 DIAGNOSIS — I4891 Unspecified atrial fibrillation: Secondary | ICD-10-CM

## 2023-05-28 NOTE — Telephone Encounter (Signed)
Requested medication (s) are due for refill today:   Provider to review - non delegated both  Requested medication (s) are on the active medication list:   Yes for both  Future visit scheduled:   Yes 12/16 with Dr. Sherrie Mustache     LOV 11/14/2022 with Dr. Sherrie Mustache   Last ordered: Oxycodone 04/29/2023 #180, 0 refills;   Valium 06/13/2022 #120, 0 refills  Non delegated refills    Requested Prescriptions  Pending Prescriptions Disp Refills   oxyCODONE (ROXICODONE) 15 MG immediate release tablet 180 tablet 0    Sig: Take 1-2 tablets (15-30 mg total) by mouth every 4 (four) hours as needed for pain.     Not Delegated - Analgesics:  Opioid Agonists Failed - 05/25/2023  2:42 PM      Failed - This refill cannot be delegated      Failed - Urine Drug Screen completed in last 360 days      Failed - Valid encounter within last 3 months    Recent Outpatient Visits           6 months ago Estrogen deficiency   [facility] Malva Limes, MD   9 months ago Acute non-recurrent pansinusitis   [facility] [place], Marzella Schlein, MD   11 months ago Congestion of left ear   [facility] Merita Norton T, FNP   12 months ago Contusion of left wrist, initial encounter   [facility] Malva Limes, MD   1 year ago Hoarseness, persistent   [facility] Malva Limes, MD       Future Appointments             In 1 week Fisher, Demetrios Isaacs, MD [facility]   In 1 month [place], Lavonna Rua, NP [facility]             diazepam (VALIUM) 10 MG tablet 120 tablet 0    Sig: TAKE 1 TABLET BY MOUTH EVERY 6 HOURS AS NEEDED FOR ANXIETY.     Not Delegated - Psychiatry: Anxiolytics/Hypnotics 2 Failed - 05/25/2023  2:42 PM      Failed - This refill cannot be delegated      Failed - Urine Drug Screen completed in last 360 days       Failed - Valid encounter within last 6 months    Recent Outpatient Visits           6 months ago Estrogen deficiency   [facility] Malva Limes, MD   9 months ago Acute non-recurrent pansinusitis   [facility] [place], Marzella Schlein, MD   11 months ago Congestion of left ear   [facility] Jacky Kindle, FNP   12 months ago Contusion of left wrist, initial encounter   [facility] Malva Limes, MD   1 year ago Hoarseness, persistent   [facility] Malva Limes, MD       Future Appointments             In 1 week Fisher, Demetrios Isaacs, MD [facility]   In 1 month Charlsie Quest, NP [facility] - Patient is not pregnant

## 2023-05-29 MED ORDER — OXYCODONE HCL 15 MG PO TABS: 15.0000 mg | ORAL_TABLET | ORAL | 0 refills | Status: DC | PRN

## 2023-05-29 MED ORDER — DIAZEPAM 10 MG PO TABS: ORAL_TABLET | ORAL | 0 refills | Status: DC

## 2023-06-08 ENCOUNTER — Ambulatory Visit (INDEPENDENT_AMBULATORY_CARE_PROVIDER_SITE_OTHER): Payer: 59 | Admitting: Family Medicine

## 2023-06-08 VITALS — BP 126/81 | HR 86 | Temp 97.6°F | Resp 16 | Ht 68.0 in | Wt 191.8 lb

## 2023-06-08 DIAGNOSIS — I480 Paroxysmal atrial fibrillation: Secondary | ICD-10-CM | POA: Diagnosis not present

## 2023-06-08 DIAGNOSIS — E785 Hyperlipidemia, unspecified: Secondary | ICD-10-CM | POA: Diagnosis not present

## 2023-06-08 DIAGNOSIS — E876 Hypokalemia: Secondary | ICD-10-CM | POA: Diagnosis not present

## 2023-06-08 NOTE — Progress Notes (Signed)
Established patient visit   Patient: Courtney Ross   DOB: 30-Nov-1955   67 y.o. Female  MRN: [id number] Visit Date: 06/08/2023  Today's healthcare provider: Mila Merry, MD   Chief Complaint  Patient presents with   Follow-up    Hospital f/u -on heart monitor since leaving hospital    Subjective    HPI Follow up hospitalization 11-27 through 11-28 for new onset a-fib with RVR that starting while recovering from routine screening colonoscopy. She states she had an awful time with the colonoscopy prep including nausea and vomiting. Had was mildly hypokalemic at initial evaluation. She was given iv potassium and diltiazem and converted to SR. She was started on metoprolol and discharged on 1/2 x 50mg  daily. She was not anticoagulated due to brief nature of episode. She was discharged on heart monitor which she is still wearing. States she has felt fatigued but otherwise well since discharge.   She is noted to have mildly elevated atria on echo. Is on rosuvastatin and due to check lipids.   She was recently prescribed but never started it.   Medications: Outpatient Medications Prior to Visit  Medication Sig   aspirin EC 81 MG tablet Take 81 mg by mouth at bedtime.   buPROPion (WELLBUTRIN SR) 150 MG 12 hr tablet TAKE 1 TABLET BY MOUTH 3 TIMES  DAILY   Cholecalciferol (VITAMIN D3) 5000 UNITS CAPS Take 5,000 Units by mouth daily.    Coenzyme Q10 200 MG capsule Take 200 mg by mouth daily.   cyclobenzaprine (FLEXERIL) 10 MG tablet Take 1 tablet (10 mg total) by mouth 3 (three) times daily as needed for muscle spasms.   diazepam (VALIUM) 10 MG tablet TAKE 1 TABLET BY MOUTH EVERY 6 HOURS AS NEEDED FOR ANXIETY.   diclofenac Sodium (VOLTAREN) 1 % GEL SMARTSIG:1 Gram(s) Topical 4 Times Daily PRN   estrogens, conjugated, (PREMARIN) 0.45 MG tablet Take 1 tablet (0.45 mg total) by mouth daily. Take daily for 21 days then do not take for 7 days.   fluticasone (FLONASE) 50 MCG/ACT nasal  spray Place 1 spray into both nostrils daily as needed for allergies or rhinitis.   lidocaine (LIDODERM) 5 % APPLY 1 PATCH TOPICALLY TO SKIN  DAILY MAY BE LEFT ON UP TO 12  HOURS WITHIN A 24 HOUR PERIOD AS DIRECTED BY MD   linaclotide (LINZESS) 290 MCG CAPS capsule Take 1 capsule (290 mcg total) by mouth daily before breakfast.   meloxicam (MOBIC) 15 MG tablet TAKE 1 TABLET BY MOUTH AT  BEDTIME AS NEEDED FOR PAIN   metoprolol succinate (TOPROL-XL) 25 MG 24 hr tablet Take 0.5 tablets (12.5 mg total) by mouth daily.   Multiple Vitamin (MULTIVITAMIN WITH MINERALS) TABS tablet Take 1 tablet by mouth daily.   MYRBETRIQ 25 MG TB24 tablet TAKE 1 TABLET BY MOUTH DAILY   omeprazole (PRILOSEC) 40 MG capsule Take 1 capsule (40 mg total) by mouth 2 (two) times daily before a meal.   oxyCODONE (ROXICODONE) 15 MG immediate release tablet Take 1-2 tablets (15-30 mg total) by mouth every 4 (four) hours as needed for pain.   phentermine 15 MG capsule Take 1 capsule (15 mg total) by mouth every morning.   pramipexole (MIRAPEX) 0.25 MG tablet Take 2 tablets (0.5 mg total) by mouth at bedtime.   pregabalin (LYRICA) 150 MG capsule Take 1 capsule (150 mg total) by mouth 2 (two) times daily.   RESTASIS 0.05 % ophthalmic emulsion Place 1 drop into both  eyes 2 (two) times daily.    rosuvastatin (CRESTOR) 10 MG tablet Take 1 tablet (10 mg total) by mouth daily.   sertraline (ZOLOFT) 50 MG tablet TAKE 1 TABLET BY MOUTH DAILY   No facility-administered medications prior to visit.    Review of Systems     Objective    BP 126/81 (BP Location: Left Arm, Patient Position: Sitting, Cuff Size: Normal)   Pulse 86   Temp 97.6 F (36.4 C)   Resp 16   Ht 5\' 8"  (1.727 m)   Wt 191 lb 12.8 oz (87 kg)   SpO2 100%   BMI 29.16 kg/m    Physical Exam    General: Appearance:    Well developed, well nourished female in no acute distress  Eyes:    PERRL, conjunctiva/corneas clear, EOM's intact       Lungs:     Clear to  auscultation bilaterally, respirations unlabored  Heart:    Normal heart rate. Regular rhythm. No murmurs, rubs, or gallops.    MS:   All extremities are intact.    Neurologic:   Awake, alert, oriented x 3. No apparent focal neurological defect.         Assessment & Plan     1. Paroxysmal atrial fibrillation (HCC) (Primary) Converted with diltiazem in ER with no signs of recurrent arrhythmia. Likely predisposed to a-fib with hypokalemia precipitating recent episode. Is to finish monitor and follow up cardiology as scheduled.   2. Hypokalemia  - Comprehensive metabolic panel  3. Dyslipidemia She is tolerating rosuvastatin well with no adverse effects.   - Direct LDL - Lipoprotein A (LPA) - Lipid panel         Mila Merry, MD  [facility] [phone] (phone) [phone] (fax)  [facility]

## 2023-06-09 ENCOUNTER — Ambulatory Visit: Payer: 59 | Admitting: Obstetrics and Gynecology

## 2023-06-09 ENCOUNTER — Encounter: Payer: Self-pay | Admitting: Obstetrics and Gynecology

## 2023-06-09 VITALS — BP 138/79 | HR 91 | Resp 16 | Ht 68.0 in | Wt 191.2 lb

## 2023-06-09 DIAGNOSIS — I10 Essential (primary) hypertension: Secondary | ICD-10-CM

## 2023-06-09 DIAGNOSIS — I4891 Unspecified atrial fibrillation: Secondary | ICD-10-CM

## 2023-06-09 DIAGNOSIS — R635 Abnormal weight gain: Secondary | ICD-10-CM | POA: Diagnosis not present

## 2023-06-09 DIAGNOSIS — E785 Hyperlipidemia, unspecified: Secondary | ICD-10-CM

## 2023-06-09 DIAGNOSIS — N951 Menopausal and female climacteric states: Secondary | ICD-10-CM | POA: Diagnosis not present

## 2023-06-09 LAB — LIPID PANEL
Chol/HDL Ratio: 3.5 {ratio} (ref 0.0–4.4)
Cholesterol, Total: 261 mg/dL — ABNORMAL HIGH (ref 100–199)
HDL: 74 mg/dL (ref >39)
LDL Chol Calc (NIH): 166 mg/dL — ABNORMAL HIGH (ref 0–99)
Triglycerides: 118 mg/dL (ref 0–149)
VLDL Cholesterol Cal: 21 mg/dL (ref 5–40)

## 2023-06-09 LAB — COMPREHENSIVE METABOLIC PANEL
ALT: 17 [IU]/L (ref 0–32)
AST: 20 [IU]/L (ref 0–40)
Albumin: 4.2 g/dL (ref 3.9–4.9)
Alkaline Phosphatase: 125 [IU]/L — ABNORMAL HIGH (ref 44–121)
BUN/Creatinine Ratio: 12 (ref 12–28)
BUN: 12 mg/dL (ref 8–27)
Bilirubin Total: 0.3 mg/dL (ref 0.0–1.2)
CO2: 22 mmol/L (ref 20–29)
Calcium: 9.4 mg/dL (ref 8.7–10.3)
Chloride: 105 mmol/L (ref 96–106)
Creatinine, Ser: 0.97 mg/dL (ref 0.57–1.00)
Globulin, Total: 2.4 g/dL (ref 1.5–4.5)
Glucose: 93 mg/dL (ref 70–99)
Potassium: 4.7 mmol/L (ref 3.5–5.2)
Sodium: 141 mmol/L (ref 134–144)
Total Protein: 6.6 g/dL (ref 6.0–8.5)
eGFR: 64 mL/min/{1.73_m2} (ref >59)

## 2023-06-09 LAB — LIPOPROTEIN A (LPA): Lipoprotein (a): <8.4 nmol/L (ref <75.0)

## 2023-06-09 LAB — LDL CHOLESTEROL, DIRECT: LDL Direct: 168 mg/dL — ABNORMAL HIGH (ref 0–99)

## 2023-06-09 MED ORDER — ESTROGENS CONJUGATED 0.625 MG PO TABS: 0.6250 mg | ORAL_TABLET | Freq: Every day | ORAL | 3 refills | Status: AC

## 2023-06-09 NOTE — Patient Instructions (Addendum)
 WEIGHT LOSS MEDICATION OPTIONS  Phentermine-Topiramate extended release (Qsymia) is the most effective weight loss drug available to date. It combines an adrenergic agonist with a neurostabilizer. Adults with migraines and obesity are good candidates for this weight loss medication. This medication is a tablet, and the dose gets titrated up every few weeks.  Side effects include: abnormal sensations, dizziness, taste alterations, insomnia, constipation, and dry mouth. If more than 5% weight loss is not achieved after 12 weeks of the maximum dose, the weight loss pill should be gradually discontinued.  You previously tried Phentermine at a different dose, this one combines Phentermine with another medication to allow for longer use with slower but more manageable weight loss. You can be on this medication for > 1 year or longer.    Bupropion/Naltrexone (Contrave) combines a dopamine/norepinephrine reuptake inhibitor and an opioid receptor antagonist. This medication is a tablet, and the dose gets titrated up every few weeks. It controls cravings and addicted behaviors related to food. Side effects include: constipation, headaches, insomnia, and dry mouth.  You can be on this medication for > 1 year or longer.    Wegovy, Zepbound, and Ozempic are newer medication, once-weekly injectable prescriptions. Works well for patient with weight-associated comorbidities such as diabetes or high cholesterol. Each contains semaglutide (GLP-1). Common side effects may include: nausea, diarrhea, vomiting, constipation, stomach (abdomen) pain, headache, tiredness (fatigue), upset stomach, dizziness, feeling bloated, belching, gas, stomach flu, and heartburn.  There is a high demand for this particular medication so may be a slight delay in receiving it. You can be on this medication for > 1 year or longer.   Courtney Ross is an injection 3 mg is an injectable prescription medicine used once daily. Works well for patient with  weight-associated comorbidities such as diabetes or high cholesterol. Saxenda works like GLP-1 by regulating your appetite, which can lead to eating fewer calories and losing weight. The most common side effects of Saxenda in adults include nausea, diarrhea, constipation, vomiting, injection site reaction, low blood sugar (hypoglycemia), headache, tiredness (fatigue), dizziness, stomach pain, and change in enzyme (lipase) levels in your blood.

## 2023-06-09 NOTE — Progress Notes (Signed)
    GYNECOLOGY PROGRESS NOTE  Subjective:    Patient ID: Courtney Ross, female    DOB: 01-Sep-1955, 67 y.o.   MRN: [id number]  HPI:   Patient is a 67 y.o. female who presents for 1 month weight management follow up. She has a past history of obesity, dyslipidemia. She has not initiated use of Phentermine because she underwent a colonoscopy last month and then went in to A-fib during recovery.  She was hospitalized for 1 day for observation, and is currently wearing a Holter monitor.  She was also seen by her PCP last week and her cholesterol was noted to be elevated and the dose of her medication was increased. Takes every other day due to side effects (worsens her restless leg syndrome). She is awaiting her follow up with her Cardiologist next month to see if any other medications will be prescribed or if she will be cleared.    Additionally, she is following up on her HRT.  Was initiated on Premarin 0.45 mg for menopausal symptoms, would like to increase her dosing.     The following portions of the patient's history were reviewed and updated as appropriate: allergies, current medications, past family history, past medical history, past social history, past surgical history, and problem list.  Review of Systems Pertinent items noted in HPI and remainder of comprehensive ROS otherwise negative.   Objective:   Blood pressure 138/79, pulse 91, resp. rate 16, height 5\' 8"  (1.727 m), weight 191 lb 3.2 oz (86.7 kg). Body mass index is 29.07 kg/m. General appearance: alert and no distress Remainder of exam deferred   Assessment:   1. Weight gain   2. Essential hypertension   3. Dyslipidemia   4. Menopausal and female climacteric states   5. Atrial fibrillation with rapid ventricular response (HCC)      Plan:   Weight gain - Discussion had that at this time, I would not recommend initiation of the Phentermine or other weight loss medications until further evaluation of her A-fib  has been undertaken. Notes that she has an appointment with her Cardiologist next month. Can resume discussion of weight loss after this.  Given list of other weight loss medication options that may able to be utilized. Advised to f/u with her insurance company to see if any options may be covered.  Essential HTN  - currently on no meds. Currently Stage 1.  Dyslipidemia - recently increased on medication. Also alternating with Co-Q 10.   Menopausal and female climacteric states  -  Patient initiated on 0.45 mg of Premarin last month, notes that she could stand to increase her dose.  Will change prescription to 0.625 mg. Afib - awaiting further evalaution by Cardiology.     To follow up in 1 month.   A total of 19 minutes were spent during this encounter, including review of previous progress notes, recent imaging and labs, face-to-face with time with patient involving counseling and coordination of care, as well as documentation for current visit.  Hildred Laser, MD [facility]

## 2023-06-10 ENCOUNTER — Other Ambulatory Visit: Payer: Self-pay | Admitting: Family Medicine

## 2023-06-10 DIAGNOSIS — E7800 Pure hypercholesterolemia, unspecified: Secondary | ICD-10-CM

## 2023-06-10 MED ORDER — ROSUVASTATIN CALCIUM 20 MG PO TABS: 20.0000 mg | ORAL_TABLET | Freq: Every day | ORAL | 1 refills | Status: DC

## 2023-06-23 ENCOUNTER — Other Ambulatory Visit: Payer: Self-pay | Admitting: Family Medicine

## 2023-06-23 NOTE — Telephone Encounter (Signed)
 Medication Refill -  Most Recent Primary Care Visit:  Provider: [name]  Department: [facility]  Visit Type: HOSPITAL FU  Date: 06/08/2023  Medication: oxyCODONE  (ROXICODONE ) 15 MG immediate release tablet [[id number]]   Has the patient contacted their pharmacy? Yes  (Agent: If yes, when and what did the pharmacy advise?) Contact Office   Is this the correct pharmacy for this prescription? Yes  This is the patient's preferred pharmacy:  [facility] - [place], [place] - [address] [place] [address] Phone: [phone] Fax: [phone]     Has the prescription been filled recently? Yes  Is the patient out of the medication? No  Has the patient been seen for an appointment in the last year OR does the patient have an upcoming appointment? Yes  Can we respond through MyChart? Yes  Agent: Please be advised that Rx refills may take up to 3 business days. We ask that you follow-up with your pharmacy.

## 2023-06-27 NOTE — Telephone Encounter (Signed)
 Requested medication (s) are due for refill today: Yes  Requested medication (s) are on the active medication list: Yes  Last refill:  05/29/23 #180, 0RF  Future visit scheduled: No  Notes to clinic:  Unable to refill per protocol, cannot delegate.      Requested Prescriptions  Pending Prescriptions Disp Refills   oxyCODONE  (ROXICODONE ) 15 MG immediate release tablet 180 tablet 0    Sig: Take 1-2 tablets (15-30 mg total) by mouth every 4 (four) hours as needed for pain.     Not Delegated - Analgesics:  Opioid Agonists Failed - 06/27/2023 10:54 AM      Failed - This refill cannot be delegated      Failed - Urine Drug Screen completed in last 360 days      Passed - Valid encounter within last 3 months    Recent Outpatient Visits           2 weeks ago Paroxysmal atrial fibrillation [facility])   [place] [facility] [name], MD   7 months ago Estrogen deficiency   [facility] [name], MD   10 months ago Acute non-recurrent pansinusitis   [facility], Jon HERO, MD   1 year ago Congestion of left ear   [facility] Emilio Kelly DASEN, FNP   1 year ago Contusion of left wrist, initial encounter   [place] [facility] [name], MD       Future Appointments             In 1 week Gerard Frederick, NP [facility] [place]   In 2 months Vanga, Corinn Skiff, MD [facility]

## 2023-06-28 MED ORDER — OXYCODONE HCL 15 MG PO TABS: 15.0000 mg | ORAL_TABLET | ORAL | 0 refills | Status: DC | PRN

## 2023-07-02 DIAGNOSIS — I4891 Unspecified atrial fibrillation: Secondary | ICD-10-CM | POA: Diagnosis not present

## 2023-07-07 ENCOUNTER — Telehealth: Payer: Self-pay | Admitting: Family Medicine

## 2023-07-07 NOTE — Telephone Encounter (Signed)
 Letter being sent back that patient dropped off from [facility] regarding refills of Oxycodone 15 mg. She may need a prior auth to be able to continue this med.

## 2023-07-10 ENCOUNTER — Ambulatory Visit: Payer: 59 | Attending: Cardiology | Admitting: Cardiology

## 2023-07-10 ENCOUNTER — Encounter: Payer: Self-pay | Admitting: Cardiology

## 2023-07-10 VITALS — BP 139/91 | HR 88 | Ht 68.0 in | Wt 188.8 lb

## 2023-07-10 DIAGNOSIS — I480 Paroxysmal atrial fibrillation: Secondary | ICD-10-CM

## 2023-07-10 DIAGNOSIS — E785 Hyperlipidemia, unspecified: Secondary | ICD-10-CM | POA: Diagnosis not present

## 2023-07-10 DIAGNOSIS — G894 Chronic pain syndrome: Secondary | ICD-10-CM

## 2023-07-10 DIAGNOSIS — K219 Gastro-esophageal reflux disease without esophagitis: Secondary | ICD-10-CM

## 2023-07-10 DIAGNOSIS — I10 Essential (primary) hypertension: Secondary | ICD-10-CM

## 2023-07-10 MED ORDER — APIXABAN 5 MG PO TABS: 5.0000 mg | ORAL_TABLET | Freq: Two times a day (BID) | ORAL | 11 refills | Status: DC

## 2023-07-10 MED ORDER — METOPROLOL SUCCINATE ER 25 MG PO TB24: 25.0000 mg | ORAL_TABLET | Freq: Every day | ORAL | 3 refills | Status: DC

## 2023-07-10 MED ORDER — APIXABAN 5 MG PO TABS: 5.0000 mg | ORAL_TABLET | Freq: Two times a day (BID) | ORAL | 0 refills | Status: DC

## 2023-07-10 NOTE — Patient Instructions (Signed)
Medication Instructions:  Your physician has recommended you make the following change in your medication:   STOP Aspirin START Eliquis 5 mg twice a day START Toprol XL 25 mg once daily   *If you need a refill on your cardiac medications before your next appointment, please call your pharmacy*   Lab Work: None  If you have labs (blood work) drawn today and your tests are completely normal, you will receive your results only by: MyChart Message (if you have MyChart) OR A paper copy in the mail If you have any lab test that is abnormal or we need to change your treatment, we will call you to review the results.   Testing/Procedures: None   Follow-Up: At [facility], you and your health needs are our priority.  As part of our continuing mission to provide you with exceptional heart care, we have created designated Provider Care Teams.  These Care Teams include your primary Cardiologist (physician) and Advanced Practice Providers (APPs -  Physician Assistants and Nurse Practitioners) who all work together to provide you with the care you need, when you need it.   Your next appointment:   6 week(s)  Provider:   Charlsie Quest, NP

## 2023-07-10 NOTE — Progress Notes (Signed)
Cardiology Office Note:  .   Date:  07/10/2023  ID:  Marshall Cork, DOB Nov 04, 1955, MRN [id number] PCP: Malva Limes, MD  [facility] Providers Cardiologist:  None    History of Present Illness: .   Courtney Ross is a 68 y.o. female with a history of dysphagia, chronic GERD, and new onset atrial fibrillation, anxiety, fatty liver disease, migraines, hypercholesterolemia, hypertension, and restless leg syndrome, who is here today for follow-up.   Patient presented to [facility] on 05/20/2023 with new onset atrial fibrillation with RVR.  She was status post colonoscopy with polypectomy and was found to be in atrial fibrillation RVR during recovery.  She was admitted to the hospital, received IV diltiazem drip and converted to sinus rhythm.  She was asymptomatic.  Discussed with cardiology she was considered medically stable for discharge on a low-dose of metoprolol.  Anticoagulation was not started at the time due to recent procedure however she was to be placed on a ZIO monitor to decide if she needed to be on anticoagulation for recurrent atrial fibrillation.  She was considered stable and able to be discharged from the hospital 05/21/2023.   She returns to clinic today stating that overall she has done fairly well.  She has noted on occasion the need to take deep breaths and some occasional palpitations.  She states that she has been compliant with her current medications without any adverse side effects.  Denies any chest pain dyspnea on exertion, peripheral edema, lightheadedness or dizziness. She had one episode of vomiting that she states she will follow up with GI at a later date. Denies any recurrent hospitalizations or visits to the emergency department.   ROS: 10 point review of systems has been reviewed and considered negative except what is been listed in the HPI  Studies Reviewed: Marland Kitchen   EKG Interpretation Date/Time:  Friday July 10 2023 15:22:43 EST Ventricular  Rate:  88 PR Interval:  132 QRS Duration:  88 QT Interval:  378 QTC Calculation: 457 R Axis:   -39  Text Interpretation: Normal sinus rhythm Left axis deviation When compared with ECG of 20-May-2023 12:09, PREVIOUS ECG IS PRESENT Confirmed by Charlsie Quest ([id number]) on 07/10/2023 3:27:26 PM    Event Monitor (Zio) 07/02/23 Patient had a min HR of 58 bpm, max HR of 195 bpm, and avg HR of 86 bpm. Predominant underlying rhythm was Sinus Rhythm. 1 run of Ventricular Tachycardia occurred lasting 5 beats with a max rate of 128 bpm (avg 111 bpm). 18 Supraventricular Tachycardia  runs occurred, the run with the fastest interval lasting 18 beats with a max rate of 188 bpm (avg 151 bpm); the run with the fastest interval was also the longest. Atrial Fibrillation occurred (<1% burden), ranging from 103-195 bpm (avg of 141 bpm), the  longest lasting 35 mins 37 secs with an avg rate of 133 bpm. Isolated SVEs were rare (<1.0%), SVE Couplets were rare (<1.0%), and SVE Triplets were rare (<1.0%). Isolated VEs were rare (<1.0%), VE Couplets were rare (<1.0%), and no VE Triplets were  present.   2D echo 05/20/2023 1. Left ventricular ejection fraction, by estimation, is 60 to 65%. The  left ventricle has normal function. The left ventricle has no regional  wall motion abnormalities. Left ventricular diastolic parameters were  normal.   2. Right ventricular systolic function is normal. The right ventricular  size is normal. Tricuspid regurgitation signal is inadequate for assessing  PA pressure.   3. Left atrial size  was mildly dilated.   4. The mitral valve is normal in structure. Mild mitral valve  regurgitation. No evidence of mitral stenosis.   5. The aortic valve is bicuspid. Aortic valve regurgitation is not  visualized. No aortic stenosis is present.   6. The inferior vena cava is normal in size with greater than 50%  respiratory variability, suggesting right atrial pressure of 3 mmHg.   Risk  Assessment/Calculations:    CHA2DS2-VASc Score = 3   This indicates a 3.2% annual risk of stroke. The patient's score is based upon: CHF History: 0 HTN History: 1 Diabetes History: 0 Stroke History: 0 Vascular Disease History: 0 Age Score: 1 Gender Score: 1         Physical Exam:   VS:  BP (!) 139/91 (BP Location: Left Arm, Patient Position: Sitting, Cuff Size: Normal)   Pulse 88   Ht 5\' 8"  (1.727 m)   Wt 188 lb 12.8 oz (85.6 kg)   SpO2 96%   BMI 28.71 kg/m    Wt Readings from Last 3 Encounters:  07/10/23 188 lb 12.8 oz (85.6 kg)  06/09/23 191 lb 3.2 oz (86.7 kg)  06/08/23 191 lb 12.8 oz (87 kg)    GEN: Well nourished, well developed in no acute distress NECK: No JVD; No carotid bruits CARDIAC: RRR, no murmurs, rubs, gallops RESPIRATORY:  Clear to auscultation without rales, wheezing or rhonchi  ABDOMEN: Soft, non-tender, non-distended EXTREMITIES:  No edema; No deformity   ASSESSMENT AND PLAN: .   New onset paroxysmal atrial fibrillation with recent hospitalization in 04/2023.  Patient converted with diltiazem in the emergency room after colonoscopy.  She was placed on a ZIO XT monitor to determine A-fib burden and if she needed anticoagulation.  ZIO XT monitor revealed she had continued episodes of atrial fibrillation with the longest lasting 1 hour and 5 minutes.  With a CHA2DS2-VASc score of 3 she has been started on apixaban 5 mg twice daily for stroke prophylaxis her aspirin has been Dc'd.  Her metoprolol has been increased from 12.5 mg to 25 mg daily.  EKG today reveals sinus rhythm with a rate of 88 with a left axis deviation.  Echocardiogram during recent hospitalization revealed LVEF of 60 to 65%, no regional wall motion abnormalities, mild mitral regurgitation.  Essential hypertension with blood pressure today 139/91.  Her metoprolol has been increased to 25 mg daily.  She has been encouraged to monitor pressure 1 to 2 hours postmedication administration as  well.  Dyslipidemia with an LDL of 166.  She has been continued on rosuvastatin 20 mg daily.  Will likely need up titration and medication as long as she tolerates increased dose.  Chronic pain syndrome where she is on chronic pain medications.  Chronic GERD for which she is continued on omeprazole.  She does have an upcoming appointment with GI.  She has been advised that she will need to notify them that she is now started on anticoagulant for atrial fibrillation prior to any procedures being completed.        Dispo: Patient return to clinic to see MD/APP in 6 weeks or sooner if needed  Signed, Demtrius Rounds, NP

## 2023-07-10 NOTE — Progress Notes (Signed)
Already discussed at appointment today.

## 2023-07-21 ENCOUNTER — Ambulatory Visit: Payer: 59 | Admitting: Obstetrics and Gynecology

## 2023-07-22 ENCOUNTER — Ambulatory Visit (INDEPENDENT_AMBULATORY_CARE_PROVIDER_SITE_OTHER): Payer: 59 | Admitting: Emergency Medicine

## 2023-07-22 VITALS — Ht 68.0 in | Wt 180.0 lb

## 2023-07-22 DIAGNOSIS — Z1231 Encounter for screening mammogram for malignant neoplasm of breast: Secondary | ICD-10-CM

## 2023-07-22 DIAGNOSIS — Z599 Problem related to housing and economic circumstances, unspecified: Secondary | ICD-10-CM | POA: Diagnosis not present

## 2023-07-22 DIAGNOSIS — Z0000 Encounter for general adult medical examination without abnormal findings: Secondary | ICD-10-CM | POA: Diagnosis not present

## 2023-07-22 NOTE — Progress Notes (Signed)
Subjective:   Courtney Ross is a 68 y.o. female who presents for Medicare Annual (Subsequent) preventive examination.  Visit Complete: Virtual I connected with  Courtney Ross on 07/22/23 by a video and audio enabled telemedicine application and verified that I am speaking with the correct person using two identifiers.  Patient Location: Home  Provider Location: Home Office  I discussed the limitations of evaluation and management by telemedicine. The patient expressed understanding and agreed to proceed.  Vital Signs: Because this visit was a virtual/telehealth visit, some criteria may be missing or patient reported. Any vitals not documented were not able to be obtained and vitals that have been documented are patient reported.  Patient Medicare AWV questionnaire was completed by the patient on 07/20/23; I have confirmed that all information answered by patient is correct and no changes since this date.  Cardiac Risk Factors include: advanced age (>73men, >96 women);hypertension;dyslipidemia;Other (see comment), Risk factor comments: A. Fib     Objective:    Today's Vitals   07/22/23 1056 07/22/23 1057  Weight: 180 lb (81.6 kg)   Height: 5\' 8"  (1.727 m)   PainSc:  4    Body mass index is 27.37 kg/m.     07/22/2023   11:16 AM 05/20/2023   12:58 PM 05/20/2023    7:15 AM 08/11/2022    9:18 PM 07/16/2022   11:14 AM 05/08/2022   12:39 PM 07/15/2021   11:15 AM  Advanced Directives  Does Patient Have a Medical Advance Directive? Yes Yes Yes No Yes Yes Yes  Type of Estate agent of [place];Living will  Healthcare Power of [place];Living will  Healthcare Power of [place];Living will Healthcare Power of [employer] Power of Attorney  Does patient want to make changes to medical advance directive? No - Patient declined Yes (ED - Information included in AVS)   Yes (MAU/Ambulatory/Procedural Areas - Information given)  Yes (Inpatient - patient  defers changing a medical advance directive and declines information at this time)  Copy of Healthcare Power of Attorney in Chart? Yes - validated most recent copy scanned in chart (See row information)  No - copy requested  Yes - validated most recent copy scanned in chart (See row information) No - copy requested Yes - validated most recent copy scanned in chart (See row information)  Would patient like information on creating a medical advance directive?   No - Patient declined        Current Medications (verified) Outpatient Encounter Medications as of 07/22/2023  Medication Sig   apixaban (ELIQUIS) 5 MG TABS tablet Take 1 tablet (5 mg total) by mouth 2 (two) times daily.   buPROPion (WELLBUTRIN SR) 150 MG 12 hr tablet TAKE 1 TABLET BY MOUTH 3 TIMES  DAILY   Cholecalciferol (VITAMIN D3) 5000 UNITS CAPS Take 5,000 Units by mouth daily.    Coenzyme Q10 200 MG capsule Take 200 mg by mouth daily.   cyclobenzaprine (FLEXERIL) 10 MG tablet Take 1 tablet (10 mg total) by mouth 3 (three) times daily as needed for muscle spasms.   diazepam (VALIUM) 10 MG tablet TAKE 1 TABLET BY MOUTH EVERY 6 HOURS AS NEEDED FOR ANXIETY.   diclofenac Sodium (VOLTAREN) 1 % GEL SMARTSIG:1 Gram(s) Topical 4 Times Daily PRN   estrogens, conjugated, (PREMARIN) 0.625 MG tablet Take 1 tablet (0.625 mg total) by mouth daily. Take daily for 21 days then do not take for 7 days.   fluticasone (FLONASE) 50 MCG/ACT nasal spray Place 1  spray into both nostrils daily as needed for allergies or rhinitis.   lidocaine (LIDODERM) 5 % APPLY 1 PATCH TOPICALLY TO SKIN  DAILY MAY BE LEFT ON UP TO 12  HOURS WITHIN A 24 HOUR PERIOD AS DIRECTED BY MD   linaclotide (LINZESS) 290 MCG CAPS capsule Take 1 capsule (290 mcg total) by mouth daily before breakfast.   meloxicam (MOBIC) 15 MG tablet TAKE 1 TABLET BY MOUTH AT  BEDTIME AS NEEDED FOR PAIN   metoprolol succinate (TOPROL XL) 25 MG 24 hr tablet Take 1 tablet (25 mg total) by mouth daily.    Multiple Vitamin (MULTIVITAMIN WITH MINERALS) TABS tablet Take 1 tablet by mouth daily.   MYRBETRIQ 25 MG TB24 tablet TAKE 1 TABLET BY MOUTH DAILY   omeprazole (PRILOSEC) 40 MG capsule Take 1 capsule (40 mg total) by mouth 2 (two) times daily before a meal.   oxyCODONE (ROXICODONE) 15 MG immediate release tablet Take 1-2 tablets (15-30 mg total) by mouth every 4 (four) hours as needed for pain.   pramipexole (MIRAPEX) 0.25 MG tablet Take 2 tablets (0.5 mg total) by mouth at bedtime.   pregabalin (LYRICA) 150 MG capsule Take 1 capsule (150 mg total) by mouth 2 (two) times daily.   RESTASIS 0.05 % ophthalmic emulsion Place 1 drop into both eyes 2 (two) times daily.    rosuvastatin (CRESTOR) 20 MG tablet Take 1 tablet (20 mg total) by mouth daily.   sertraline (ZOLOFT) 50 MG tablet TAKE 1 TABLET BY MOUTH DAILY   apixaban (ELIQUIS) 5 MG TABS tablet Take 1 tablet (5 mg total) by mouth 2 (two) times daily. (Patient not taking: Reported on 07/22/2023)   No facility-administered encounter medications on file as of 07/22/2023.    Allergies (verified) Hydrocodone, Lisinopril, Mirtazapine, and Other   History: Past Medical History:  Diagnosis Date   Allergy 2000   Hydrocodone   Anxiety    Atypical squamous cells of undetermined significance on cytologic smear of vagina (ASC-US) 11/22/2014   Back pain    Colon polyp    Constipation    Degenerative disc disease, lumbar    Depression    Family history of adverse reaction to anesthesia    daughter nausea   Fatty liver    Fatty liver disease, nonalcoholic    GERD (gastroesophageal reflux disease)    H/O spinal fusion    Headache    migraines   Headache, migraine 05/08/2015   Hepatitis    in the 1970s.  Resolved.   High cholesterol    History of frequent ear infections in childhood 11/22/2014   Overview:   DID have Chicken Pox. DID have Measles. DID have Mumps. DID have Scarlet Fever.      Formatting of this note might be different from the  original.  DID have Chicken Pox. DID have Measles. DID have Mumps. DID have Scarlet Fever.   Hypertension    Lower back pain    Polycystic ovarian disease    PONV (postoperative nausea and vomiting)    due to surgery from the 1990's   Restless leg syndrome    Spasm 01/29/2009   Spinal cord stimulator status    Wears dentures    full upper, partial lower   Past Surgical History:  Procedure Laterality Date   APPENDECTOMY  1988   ARTHRODESIS METATARSALPHALANGEAL JOINT (MTPJ) Left 12/14/2014   Procedure: ARTHRODESIS METATARSALPHALANGEAL JOINT (MTPJ) 1ST;  Surgeon: Recardo Evangelist, DPM;  Location: [facility];  Service: Podiatry;  Laterality: Left;  LMA   back fusion  2004   lumbar region. had broken facet joint.    BACK SURGERY  [phone]   x3   BLADDER SUSPENSION  2006   BREAST BIOPSY Left 1990's   core Sankar   BREAST SURGERY  2005   lumpectomy   CARPAL TUNNEL RELEASE Bilateral 02/26/07,05/21/07   CATARACT EXTRACTION W/PHACO Right 02/11/2021   Procedure: CATARACT EXTRACTION PHACO AND INTRAOCULAR LENS PLACEMENT (IOC) RIGHT PANOPTIX LENS;  Surgeon: Nevada Crane, MD;  Location: [facility];  Service: Ophthalmology;  Laterality: Right;  PANOPTIX LENS 1.92 00:17.8   CATARACT EXTRACTION W/PHACO Left 03/04/2021   Procedure: CATARACT EXTRACTION PHACO AND INTRAOCULAR  LENS PLACEMENT (IOC) LEFT PANOPTIX LENS 2.87 00:30.0;  Surgeon: Nevada Crane, MD;  Location: [facility];  Service: Ophthalmology;  Laterality: Left;  PANOPTIX LENS   CHOLECYSTECTOMY N/A 05/03/2018   Procedure: LAPAROSCOPIC CHOLECYSTECTOMY WITH INTRAOPERATIVE CHOLANGIOGRAM;  Surgeon: Earline Mayotte, MD;  Location: [facility] ORS;  Service: General;  Laterality: N/A;   COLONOSCOPY     COLONOSCOPY WITH PROPOFOL N/A 01/10/2020   Procedure: COLONOSCOPY WITH PROPOFOL;  Surgeon: Wyline Mood, MD;  Location: [facility] END[facility]COPY;  Service: Gastroenterology;  Laterality: N/A;   COLONOSCOPY WITH PROPOFOL  N/A 05/20/2023   Procedure: COLONOSCOPY WITH PROPOFOL;  Surgeon: Toney Reil, MD;  Location: [facility] ENDOSCOPY;  Service: Gastroenterology;  Laterality: N/A;   COLPORRHAPHY     DILATION AND CURETTAGE OF UTERUS     ECTOPIC PREGNANCY SURGERY  1982   ESOPHAGOGASTRODUODENOSCOPY (EGD) WITH PROPOFOL N/A 05/08/2022   Procedure: ESOPHAGOGASTRODUODENOSCOPY (EGD) WITH BIOPSY;  Surgeon: Toney Reil, MD;  Location: [facility];  Service: Endoscopy;  Laterality: N/A;   EYE SURGERY  2022   Cataracts   FOOT SURGERY Left 2017   fusion on big toe, hammer toe, bunions   HAMMER TOE SURGERY Left 03/22/2015   Procedure: HAMMER TOE CORRECTION L 2ND AND 3RD ;  Surgeon: Recardo Evangelist, DPM;  Location: [facility];  Service: Podiatry;  Laterality: Left;  WITH LOCAL   HERNIA REPAIR Right 1988   inguinal   LAMINECTOMY  1993, 1994   NECK SURGERY  2006   fusion   POLYPECTOMY  05/20/2023   Procedure: POLYPECTOMY;  Surgeon: Toney Reil, MD;  Location: [facility] ENDOSCOPY;  Service: Gastroenterology;;   RECTOCELE REPAIR N/A 08/25/2016   Procedure: POSTERIOR REPAIR (RECTOCELE);  Surgeon: Herold Harms, MD;  Location: [facility] ORS;  Service: Gynecology;  Laterality: N/A;   SPINAL CORD STIMULATOR BATTERY EXCHANGE Right 04/08/2019   Procedure: SPINAL CORD STIMULATOR BATTERY EXCHANGE;  Surgeon: Odette Fraction, MD;  Location: [facility] OR;  Service: Neurosurgery;  Laterality: Right;  SPINAL CORD STIMULATOR BATTERY EXCHANGE   SPINAL CORD STIMULATOR IMPLANT  2014   stimulator in the ileo inguinal nerve due to scar tissue in abdomen   SPINE SURGERY  1993-2004   Two laminectomies,A spinal fusion and a neck disc   TONSILLECTOMY  1985   [place]BD[place]ST[place]CTOMY      [place]   TUBAL LIGATION  1985   VENTRAL HERNIA REPAIR N/A 09/01/2018   6.4 cm Ventra lite mesh. HERNIA REPAIR VENTRAL ADULT;  Surgeon: Earline Mayotte, MD;  Location: [facility] ORS;  Service: General;  Laterality: N/A;   Family History   Problem Relation Age of Onset   Diabetes Mother    Early death Mother    Miscarriages / [place] Mother    Diabetes Father    Hypertension Father    CAD Father  Arthritis Father    Varicose Veins Father    Diabetes Sister        non-Insulin dependent diabetes mellitus   Obesity Sister    Heart disease Brother    Early death Brother    Kidney disease Brother    Cancer Daughter        cancer of the cervix at age 72-18   Anxiety disorder Daughter    Varicose Veins Paternal Grandmother    Heart disease Paternal Grandfather    Heart disease Brother    Breast cancer Paternal Aunt    Colon cancer Paternal Uncle    ADD / ADHD Son    Ovarian cancer Neg Hx    Social History   Socioeconomic History   Marital status: Divorced    Spouse name: Not on file   Number of children: 4   Years of education: Not on file   Highest education level: GED or equivalent  Occupational History    Comment: disabled  Tobacco Use   Smoking status: Never   Smokeless tobacco: Never  Vaping Use   Vaping status: Never Used  Substance and Sexual Activity   Alcohol use: Not Currently    Comment: rare   Drug use: Never   Sexual activity: Not Currently    Birth control/protection: Post-menopausal, None  Other Topics Concern   Not on file  Social History Narrative   Pt is currently receiving assistance from [employer] and gets food stamps.    Social Drivers of Corporate investment banker Strain: High Risk (07/22/2023)   Overall Financial Resource Strain (CARDIA)    Difficulty of Paying Living Expenses: Hard  Food Insecurity: No Food Insecurity (07/22/2023)   Hunger Vital Sign    Worried About Running Out of Food in the Last Year: Never true    Ran Out of Food in the Last Year: Never true  Transportation Needs: No Transportation Needs (07/22/2023)   PRAPARE - Administrator, Civil Service (Medical): No    Lack of Transportation (Non-Medical): No  Physical Activity: Insufficiently  Active (07/22/2023)   Exercise Vital Sign    Days of Exercise per Week: 3 days    Minutes of Exercise per Session: 20 min  Stress: Stress Concern Present (07/22/2023)   [employer] of Occupational Health - Occupational Stress Questionnaire    Feeling of Stress : To some extent  Social Connections: Moderately Integrated (07/22/2023)   Social Connection and Isolation Panel [NHANES]    Frequency of Communication with Friends and Family: More than three times a week    Frequency of Social Gatherings with Friends and Family: More than three times a week    Attends Religious Services: More than 4 times per year    Active Member of [employer] or Organizations: Yes    Attends Engineer, structural: More than 4 times per year    Marital Status: Divorced    Tobacco Counseling Counseling given: Not Answered   Clinical Intake:  Pre-visit preparation completed: Yes  Pain : 0-10 Pain Score: 4  Pain Type: Chronic pain Pain Location: Back Pain Descriptors / Indicators: Aching     BMI - recorded: 27.37 Nutritional Status: BMI 25 -29 Overweight Nutritional Risks: Nausea/ vomitting/ diarrhea Diabetes: No  How often do you need to have someone help you when you read instructions, pamphlets, or other written materials from your doctor or pharmacy?: 1 - Never  Interpreter Needed?: No  Information entered by :: Tora Kindred, CMA  Activities of Daily Living    07/22/2023   10:58 AM 07/20/2023   12:27 PM  In your present state of health, do you have any difficulty performing the following activities:  Hearing? 0 0  Vision? 0 0  Difficulty concentrating or making decisions? 0 0  Walking or climbing stairs? 0 0  Dressing or bathing? 0 0  Doing errands, shopping? 0 0  Preparing Food and eating ? N N  Using the Toilet? N N  In the past six months, have you accidently leaked urine? N N  Do you have problems with loss of bowel control? N N  Managing your Medications? N N  Managing  your Finances? N N  Housekeeping or managing your Housekeeping? N N    Patient Care Team: Malva Limes, MD as PCP - General (Family Medicine) Lockie Mola, MD as Referring Physician (Ophthalmology) Arlis Porta, MD as Referring Physician (Neurosurgery) Lemar Livings Merrily Pew, MD (General Surgery) Wyline Mood, MD as Consulting Physician (Gastroenterology) Rosetta Posner, DPM as Consulting Physician (Podiatry) Hildred Laser, MD as Referring Physician (Obstetrics and Gynecology)  Indicate any recent Medical Services you may have received from other than [facility] providers in the past year (date may be approximate).     Assessment:   This is a routine wellness examination for Francisco.  Hearing/Vision screen Hearing Screening - Comments:: Denies hearing loss Vision Screening - Comments:: Gets eye exams, [facility]   Goals Addressed               This Visit's Progress     Patient Stated (pt-stated)        Eat healthier and lose weight      Depression Screen    07/22/2023   11:08 AM 06/08/2023    3:25 PM 05/12/2023    9:21 AM 11/14/2022    2:51 PM 07/16/2022   11:09 AM 05/05/2022    1:57 PM 08/26/2021    3:55 PM  PHQ 2/9 Scores  PHQ - 2 Score 1 0 0 2 0 2 2  PHQ- 9 Score 2 3  7  4 9     Fall Risk    07/22/2023   11:06 AM 07/20/2023   12:27 PM 05/12/2023    9:20 AM 11/14/2022    2:50 PM 07/16/2022   10:57 AM  Fall Risk   Falls in the past year? 1 1 0 1 0  Number falls in past yr: 0 0 0 0 1  Comment     off balance at times  Injury with Fall? 1 1 0 1 0  Risk for fall due to : History of fall(s);Impaired balance/gait;Orthopedic patient  No Fall Risks History of fall(s)   Follow up Education provided;Falls prevention discussed;Falls evaluation completed  Falls evaluation completed Falls evaluation completed Education provided;Falls prevention discussed    MEDICARE RISK AT HOME: Medicare Risk at Home Any stairs in or around the home?: Yes If so, are there  any without handrails?: No Home free of loose throw rugs in walkways, pet beds, electrical cords, etc?: Yes Adequate lighting in your home to reduce risk of falls?: Yes Life alert?: No Use of a cane, walker or w/c?: No Grab bars in the bathroom?: No Shower chair or bench in shower?: Yes Elevated toilet seat or a handicapped toilet?: Yes  TIMED UP AND GO:  Was the test performed?  No    Cognitive Function:        07/22/2023   11:17 AM 07/16/2022   11:16 AM  06/30/2018   11:00 AM 05/30/2016   10:29 AM  6CIT Screen  What Year? 0 points 0 points 0 points 0 points  What month? 0 points 0 points 0 points 0 points  What time? 0 points 0 points 0 points 0 points  Count back from 20 0 points 0 points 0 points 0 points  Months in reverse 0 points 0 points 0 points 0 points  Repeat phrase 0 points 0 points 0 points 4 points  Total Score 0 points 0 points 0 points 4 points    Immunizations Immunization History  Administered Date(s) Administered   Fluad Quad(high Dose 65+) 04/24/2021   H1N1 04/25/2008   Influenza Inj Mdck Quad Pf 04/10/2018   Influenza Split 07/20/2007   Influenza, Seasonal, Injecte, Preservative Fre 04/25/2008   Influenza,inj,Quad PF,6+ Mos 04/05/2019   Influenza-Unspecified 04/23/2013, 03/20/2017, 04/10/2018, 03/17/2023   PNEUMOCOCCAL CONJUGATE-20 04/24/2021   Pneumococcal Conjugate-13 05/11/2014   Tdap 09/21/2015, 09/03/2016   Zoster Recombinant(Shingrix) 09/10/2020   Zoster, Live 01/13/2011    TDAP status: Up to date  Flu Vaccine status: Up to date  Pneumococcal vaccine status: Up to date  Covid-19 vaccine status: Declined, Education has been provided regarding the importance of this vaccine but patient still declined. Advised may receive this vaccine at local pharmacy or Health Dept.or vaccine clinic. Aware to provide a copy of the vaccination record if obtained from local pharmacy or Health Dept. Verbalized acceptance and understanding.  Qualifies for  Shingles Vaccine? Yes   Zostavax completed Yes   Shingrix Completed?: Yes, per patient  Screening Tests Health Maintenance  Topic Date Due   COVID-19 Vaccine (1) Never done   Zoster Vaccines- Shingrix (2 of 2) 11/05/2020   Medicare Annual Wellness (AWV)  07/21/2024   MAMMOGRAM  10/06/2024   DTaP/Tdap/Td (3 - Td or Tdap) 09/04/2026   DEXA SCAN  02/02/2028   Colonoscopy  05/19/2028   Pneumonia Vaccine 72+ Years old  Completed   INFLUENZA VACCINE  Completed   Hepatitis C Screening  Completed   HPV VACCINES  Aged Out    Health Maintenance  Health Maintenance Due  Topic Date Due   COVID-19 Vaccine (1) Never done   Zoster Vaccines- Shingrix (2 of 2) 11/05/2020    Colorectal cancer screening: Type of screening: Colonoscopy. Completed 05/20/23. Repeat every 5 years  Mammogram status: Ordered 07/22/23. Pt provided with contact info and advised to call to schedule appt.   Bone Density status: Completed 02/02/23. Results reflect: Bone density results: NORMAL. Repeat every 5 years.  Lung Cancer Screening: (Low Dose CT Chest recommended if Age 34-80 years, 20 pack-year currently smoking OR have quit w/in 15years.) does not qualify.   Lung Cancer Screening Referral: n/a  Additional Screening:  Hepatitis C Screening: does not qualify; Completed 04/08/10  Vision Screening: Recommended annual ophthalmology exams for early detection of glaucoma and other disorders of the eye.  Dental Screening: Recommended annual dental exams for proper oral hygiene   Community Resource Referral / Chronic Care Management: CRR required this visit?  Yes   CCM required this visit?  No     Plan:     I have personally reviewed and noted the following in the patient's chart:   Medical and social history Use of alcohol, tobacco or illicit drugs  Current medications and supplements including opioid prescriptions. Patient is currently taking opioid prescriptions. Information provided to patient  regarding non-opioid alternatives. Patient advised to discuss non-opioid treatment plan with their provider. Functional ability and status Nutritional  status Physical activity Advanced directives List of other physicians Hospitalizations, surgeries, and ER visits in previous 12 months Vitals Screenings to include cognitive, depression, and falls Referrals and appointments  In addition, I have reviewed and discussed with patient certain preventive protocols, quality metrics, and best practice recommendations. A written personalized care plan for preventive services as well as general preventive health recommendations were provided to patient.     Tora Kindred, CMA   07/22/2023   After Visit Summary: (MyChart) Due to this being a telephonic visit, the after visit summary with patients personalized plan was offered to patient via MyChart   Nurse Notes:  Placed order for [facility] (due 10/07/23) Placed referral to community resources for financial difficulty Patient declined Covid

## 2023-07-22 NOTE — Patient Instructions (Addendum)
[name] , Thank you for taking time to come for your Medicare Wellness Visit. I appreciate your ongoing commitment to your health goals. Please review the following plan we discussed and let me know if I can assist you in the future.   Referrals/Orders/Follow-Ups/Clinician Recommendations: I have placed an order for a mammogram that is due after 10/17/23. Call [facility] @ [phone] to schedule at your convenience. I placed a referral for community resources to see if they can help you with any financial assistance. You should get a call from them in the next 30 days.  This is a list of the screening recommended for you and due dates:  Health Maintenance  Topic Date Due   COVID-19 Vaccine (1) Never done   Zoster (Shingles) Vaccine (2 of 2) 11/05/2020   Mammogram  10/07/2023   Medicare Annual Wellness Visit  07/21/2024   DTaP/Tdap/Td vaccine (3 - Td or Tdap) 09/04/2026   DEXA scan (bone density measurement)  02/02/2028   Colon Cancer Screening  05/19/2028   Pneumonia Vaccine  Completed   Flu Shot  Completed   Hepatitis C Screening  Completed   HPV Vaccine  Aged Out    Advanced directives: (In Chart) A copy of your advanced directives are scanned into your chart should your provider ever need it.  Next Medicare Annual Wellness Visit scheduled for next year: Yes, 07/27/24 @ 10:50am (video visit)  Managing Pain Without Opioids Opioids are strong medicines used to treat moderate to severe pain. For some people, especially those who have long-term (chronic) pain, opioids may not be the best choice for pain management due to: Side effects like nausea, constipation, and sleepiness. The risk of addiction (opioid use disorder). The longer you take opioids, the greater your risk of addiction. Pain that lasts for more than 3 months is called chronic pain. Managing chronic pain usually requires more than one approach and is often provided by a team of health care providers working  together (multidisciplinary approach). Pain management may be done at a pain management center or pain clinic. How to manage pain without the use of opioids Use non-opioid medicines Non-opioid medicines for pain may include: Over-the-counter or prescription non-steroidal anti-inflammatory drugs (NSAIDs). These may be the first medicines used for pain. They work well for muscle and bone pain, and they reduce swelling. Acetaminophen. This over-the-counter medicine may work well for milder pain but not swelling. Antidepressants. These may be used to treat chronic pain. A certain type of antidepressant (tricyclics) is often used. These medicines are given in lower doses for pain than when used for depression. Anticonvulsants. These are usually used to treat seizures but may also reduce nerve (neuropathic) pain. Muscle relaxants. These relieve pain caused by sudden muscle tightening (spasms). You may also use a pain medicine that is applied to the skin as a patch, cream, or gel (topical analgesic), such as a numbing medicine. These may cause fewer side effects than medicines taken by mouth. Do certain therapies as directed Some therapies can help with pain management. They include: Physical therapy. You will do exercises to gain strength and flexibility. A physical therapist may teach you exercises to move and stretch parts of your body that are weak, stiff, or painful. You can learn these exercises at physical therapy visits and practice them at home. Physical therapy may also involve: Massage. Heat wraps or applying heat or cold to affected areas. Electrical signals that interrupt pain signals (transcutaneous electrical nerve stimulation, TENS). Weak lasers that  reduce pain and swelling (low-level laser therapy). Signals from your body that help you learn to regulate pain (biofeedback). Occupational therapy. This helps you to learn ways to function at home and work with less pain. Recreational  therapy. This involves trying new activities or hobbies, such as a physical activity or drawing. Mental health therapy, including: Cognitive behavioral therapy (CBT). This helps you learn coping skills for dealing with pain. Acceptance and commitment therapy (ACT) to change the way you think and react to pain. Relaxation therapies, including muscle relaxation exercises and mindfulness-based stress reduction. Pain management counseling. This may be individual, family, or group counseling.  Receive medical treatments Medical treatments for pain management include: Nerve block injections. These may include a pain blocker and anti-inflammatory medicines. You may have injections: Near the spine to relieve chronic back or neck pain. Into joints to relieve back or joint pain. Into nerve areas that supply a painful area to relieve body pain. Into muscles (trigger point injections) to relieve some painful muscle conditions. A medical device placed near your spine to help block pain signals and relieve nerve pain or chronic back pain (spinal cord stimulation device). Acupuncture. Follow these instructions at home Medicines Take over-the-counter and prescription medicines only as told by your health care provider. If you are taking pain medicine, ask your health care providers about possible side effects to watch out for. Do not drive or use heavy machinery while taking prescription opioid pain medicine. Lifestyle  Do not use drugs or alcohol to reduce pain. If you drink alcohol, limit how much you have to: 0-1 drink a day for women who are not pregnant. 0-2 drinks a day for men. Know how much alcohol is in a drink. In the [place] one drink equals one 12 oz bottle of beer (355 mL), one 5 oz glass of wine (148 mL), or one 1 oz glass of hard liquor (44 mL). Do not use any products that contain nicotine or tobacco. These products include cigarettes, chewing tobacco, and vaping devices, such as  e-cigarettes. If you need help quitting, ask your health care provider. Eat a healthy diet and maintain a healthy weight. Poor diet and excess weight may make pain worse. Eat foods that are high in fiber. These include fresh fruits and vegetables, whole grains, and beans. Limit foods that are high in fat and processed sugars, such as fried and sweet foods. Exercise regularly. Exercise lowers stress and may help relieve pain. Ask your health care provider what activities and exercises are safe for you. If your health care provider approves, join an exercise class that combines movement and stress reduction. Examples include yoga and tai chi. Get enough sleep. Lack of sleep may make pain worse. Lower stress as much as possible. Practice stress reduction techniques as told by your therapist. General instructions Work with all your pain management providers to find the treatments that work best for you. You are an important member of your pain management team. There are many things you can do to reduce pain on your own. Consider joining an online or in-person support group for people who have chronic pain. Keep all follow-up visits. This is important. Where to find more information You can find more information about managing pain without opioids from: American Academy of Pain Medicine: painmed.org Institute for Chronic Pain: instituteforchronicpain.org American Chronic Pain Association: theacpa.org Contact a health care provider if: You have side effects from pain medicine. Your pain gets worse or does not get better with treatments or  home therapy. You are struggling with anxiety or depression. Summary Many types of pain can be managed without opioids. Chronic pain may respond better to pain management without opioids. Pain is best managed when you and a team of health care providers work together. Pain management without opioids may include non-opioid medicines, medical treatments, physical  therapy, mental health therapy, and lifestyle changes. Tell your health care providers if your pain gets worse or is not being managed well enough. This information is not intended to replace advice given to you by your health care provider. Make sure you discuss any questions you have with your health care provider. Document Revised: 09/19/2020 Document Reviewed: 09/19/2020 Elsevier Patient Education  2024 [employer].  Fall Prevention in the Home, Adult Falls can cause injuries and affect people of all ages. There are many simple things that you can do to make your home safe and to help prevent falls. If you need it, ask for help making these changes. What actions can I take to prevent falls? General information Use good lighting in all rooms. Make sure to: Replace any light bulbs that burn out. Turn on lights if it is dark and use night-lights. Keep items that you use often in easy-to-reach places. Lower the shelves around your home if needed. Move furniture so that there are clear paths around it. Do not keep throw rugs or other things on the floor that can make you trip. If any of your floors are uneven, fix them. Add color or contrast paint or tape to clearly mark and help you see: Grab bars or handrails. First and last steps of staircases. Where the edge of each step is. If you use a ladder or stepladder: Make sure that it is fully opened. Do not climb a closed ladder. Make sure the sides of the ladder are locked in place. Have someone hold the ladder while you use it. Know where your pets are as you move through your home. What can I do in the bathroom?     Keep the floor dry. Clean up any water that is on the floor right away. Remove soap buildup in the bathtub or shower. Buildup makes bathtubs and showers slippery. Use non-skid mats or decals on the floor of the bathtub or shower. Attach bath mats securely with double-sided, non-slip rug tape. If you need to sit down  while you are in the shower, use a non-slip stool. Install grab bars by the toilet and in the bathtub and shower. Do not use towel bars as grab bars. What can I do in the bedroom? Make sure that you have a light by your bed that is easy to reach. Do not use any sheets or blankets on your bed that hang to the floor. Have a firm bench or chair with side arms that you can use for support when you get dressed. What can I do in the kitchen? Clean up any spills right away. If you need to reach something above you, use a sturdy step stool that has a grab bar. Keep electrical cables out of the way. Do not use floor polish or wax that makes floors slippery. What can I do with my stairs? Do not leave anything on the stairs. Make sure that you have a light switch at the top and the bottom of the stairs. Have them installed if you do not have them. Make sure that there are handrails on both sides of the stairs. Fix handrails that are broken or  loose. Make sure that handrails are as long as the staircases. Install non-slip stair treads on all stairs in your home if they do not have carpet. Avoid having throw rugs at the top or bottom of stairs, or secure the rugs with carpet tape to prevent them from moving. Choose a carpet design that does not hide the edge of steps on the stairs. Make sure that carpet is firmly attached to the stairs. Fix any carpet that is loose or worn. What can I do on the outside of my home? Use bright outdoor lighting. Repair the edges of walkways and driveways and fix any cracks. Clear paths of anything that can make you trip, such as tools or rocks. Add color or contrast paint or tape to clearly mark and help you see high doorway thresholds. Trim any bushes or trees on the main path into your home. Check that handrails are securely fastened and in good repair. Both sides of all steps should have handrails. Install guardrails along the edges of any raised decks or porches. Have  leaves, snow, and ice cleared regularly. Use sand, salt, or ice melt on walkways during winter months if you live where there is ice and snow. In the garage, clean up any spills right away, including grease or oil spills. What other actions can I take? Review your medicines with your health care provider. Some medicines can make you confused or feel dizzy. This can increase your chance of falling. Wear closed-toe shoes that fit well and support your feet. Wear shoes that have rubber soles and low heels. Use a cane, walker, scooter, or crutches that help you move around if needed. Talk with your provider about other ways that you can decrease your risk of falls. This may include seeing a physical therapist to learn to do exercises to improve movement and strength. Where to find more information Centers for Disease Control and Prevention, STEADI: TonerPromos.no [employer] on Aging: BaseRingTones.pl National Institute on Aging: BaseRingTones.pl Contact a health care provider if: You are afraid of falling at home. You feel weak, drowsy, or dizzy at home. You fall at home. Get help right away if you: Lose consciousness or have trouble moving after a fall. Have a fall that causes a head injury. These symptoms may be an emergency. Get help right away. Call 911. Do not wait to see if the symptoms will go away. Do not drive yourself to the hospital. This information is not intended to replace advice given to you by your health care provider. Make sure you discuss any questions you have with your health care provider. Document Revised: 02/10/2022 Document Reviewed: 02/10/2022 Elsevier Patient Education  2024 [employer].

## 2023-07-23 ENCOUNTER — Telehealth: Payer: Self-pay | Admitting: *Deleted

## 2023-07-23 NOTE — Progress Notes (Signed)
Complex Care Management Note  Care Guide Note 07/23/2023 Name: Courtney Ross MRN: [id number] DOB: 1955-07-12  [name] is a 68 y.o. year old female who sees Fisher, Demetrios Isaacs, MD for primary care. I reached out to Marshall Cork by phone today to offer complex care management services.  [name] was given information about Complex Care Management services today including:   The Complex Care Management services include support from the care team which includes your Nurse Coordinator, Clinical Social Worker, or Pharmacist.  The Complex Care Management team is here to help remove barriers to the health concerns and goals most important to you. Complex Care Management services are voluntary, and the patient may decline or stop services at any time by request to their care team member.   Complex Care Management Consent Status: Patient agreed to services and verbal consent obtained.   Follow up plan:  Telephone appointment with complex care management team member scheduled for:  07/30/2023  Encounter Outcome:  Patient Scheduled  Burman Nieves, CMA, Care Guide [facility]  [facility], [facility] Guide Direct Dial: [phone]  Fax: [phone] Website: [email or web address]

## 2023-07-24 ENCOUNTER — Other Ambulatory Visit: Payer: Self-pay | Admitting: Family Medicine

## 2023-07-24 MED ORDER — OXYCODONE HCL 15 MG PO TABS: 15.0000 mg | ORAL_TABLET | ORAL | 0 refills | Status: DC | PRN

## 2023-07-27 ENCOUNTER — Other Ambulatory Visit: Payer: Self-pay | Admitting: Family Medicine

## 2023-07-27 NOTE — Telephone Encounter (Unsigned)
Copied from CRM [id number]. Topic: General - Other >> Jul 27, 2023 10:34 AM Everette C wrote: Reason for CRM: Medication Refill - Most Recent Primary Care Visit:  Provider: Tora Kindred Department: [id number] Visit Type: MEDICARE AWV, SEQUENTIAL Date: 07/22/2023  Medication: cyclobenzaprine (FLEXERIL) 10 MG tablet [[id number]]  oxyCODONE (ROXICODONE) 15 MG immediate release tablet [[id number]]  Has the patient contacted their pharmacy? Yes (Agent: If no, request that the patient contact the pharmacy for the refill. If patient does not wish to contact the pharmacy document the reason why and proceed with request.) (Agent: If yes, when and what did the pharmacy advise?)  Is this the correct pharmacy for this prescription? Yes If no, delete pharmacy and type the correct one.  This is the patient's preferred pharmacy:  [facility] - [place], [place] - [address] [place] [address] Phone: [phone] Fax: [phone]  [facility] - [place], [place] - [address] [place] [address] Phone: [phone] Fax: [phone]   Has the prescription been filled recently? Yes  Is the patient out of the medication? No  Has the patient been seen for an appointment in the last year OR does the patient have an upcoming appointment? Yes  Can we respond through MyChart? No  Agent: Please be advised that Rx refills may take up to 3 business days. We ask that you follow-up with your pharmacy.

## 2023-07-27 NOTE — Telephone Encounter (Signed)
Patient requests meds to go to [facility] after listening to the recording.

## 2023-07-28 NOTE — Telephone Encounter (Signed)
 Requested medication (s) are due for refill today -yes/no  Requested medication (s) are on the active medication list -yes  Future visit scheduled -no  Last refill: cyclobenzaprine  10/01/22 #270                  Oxycodone  07/24/23 #180  Notes to clinic: non delegated - change in pharmacy requested  Requested Prescriptions  Pending Prescriptions Disp Refills   cyclobenzaprine  (FLEXERIL ) 10 MG tablet 270 tablet 0    Sig: Take 1 tablet (10 mg total) by mouth 3 (three) times daily as needed for muscle spasms.     Not Delegated - Analgesics:  Muscle Relaxants Failed - 07/28/2023  2:11 PM      Failed - This refill cannot be delegated      Passed - Valid encounter within last 6 months    Recent Outpatient Visits           1 month ago Paroxysmal atrial fibrillation [facility])   [place] [facility] [name], MD   8 months ago Estrogen deficiency   [facility] [name], MD   11 months ago Acute non-recurrent pansinusitis   [place] [facility] [place], Jon HERO, MD   1 year ago Congestion of left ear   [place] [facility] Emilio Kelly DASEN, FNP   1 year ago Contusion of left wrist, initial encounter   [place] [facility] [name], MD       Future Appointments             In 4 weeks Vanga, Corinn Skiff, MD [facility] at [place]   In 4 weeks Gerard Frederick, NP [place] [facility]             oxyCODONE  (ROXICODONE ) 15 MG immediate release tablet 180 tablet 0    Sig: Take 1-2 tablets (15-30 mg total) by mouth every 4 (four) hours as needed for pain.     Not Delegated - Analgesics:  Opioid Agonists Failed - 07/28/2023  2:11 PM      Failed - This refill cannot be delegated      Failed - Urine Drug Screen completed in last 360 days      Passed - Valid encounter within last 3 months    Recent Outpatient Visits            1 month ago Paroxysmal atrial fibrillation [facility])   [place] [facility] [name], MD   8 months ago Estrogen deficiency   [facility] [name], MD   11 months ago Acute non-recurrent pansinusitis   [place] [facility] [place], Jon HERO, MD   1 year ago Congestion of left ear   [place] [facility] Emilio Kelly DASEN, FNP   1 year ago Contusion of left wrist, initial encounter   [place] [facility] [name], MD       Future Appointments             In 4 weeks Vanga, Corinn Skiff, MD [facility] [place] [facility] at [place]   In 4 weeks Gerard Frederick, NP  [facility] at [facility]               Requested Prescriptions  Pending Prescriptions Disp Refills   cyclobenzaprine  (FLEXERIL ) 10 MG tablet 270 tablet 0    Sig: Take 1 tablet (10  mg total) by mouth 3 (three) times daily as needed for muscle spasms.     Not Delegated - Analgesics:  Muscle Relaxants Failed - 07/28/2023  2:11 PM      Failed - This refill cannot be delegated      Passed - Valid encounter within last 6 months    Recent Outpatient Visits           1 month ago Paroxysmal atrial fibrillation [facility])   [place] [facility] [name], MD   8 months ago Estrogen deficiency   [facility] [name], MD   11 months ago Acute non-recurrent pansinusitis   [place] [facility] [place], Jon HERO, MD   1 year ago Congestion of left ear   [place] [facility] Emilio Kelly DASEN, FNP   1 year ago Contusion of left wrist, initial encounter   [facility] [name], MD       Future Appointments             In 4 weeks Vanga, Corinn Skiff, MD [facility] [place] [facility] at [place]   In 4 weeks Gerard Frederick, NP [place] [facility] at  [facility]             oxyCODONE  (ROXICODONE ) 15 MG immediate release tablet 180 tablet 0    Sig: Take 1-2 tablets (15-30 mg total) by mouth every 4 (four) hours as needed for pain.     Not Delegated - Analgesics:  Opioid Agonists Failed - 07/28/2023  2:11 PM      Failed - This refill cannot be delegated      Failed - Urine Drug Screen completed in last 360 days      Passed - Valid encounter within last 3 months    Recent Outpatient Visits           1 month ago Paroxysmal atrial fibrillation [facility])   [place] [facility] [name], MD   8 months ago Estrogen deficiency   [facility] [name], MD   11 months ago Acute non-recurrent pansinusitis   [place] [facility] [place], Jon HERO, MD   1 year ago Congestion of left ear   [place] [facility] Emilio Kelly DASEN, FNP   1 year ago Contusion of left wrist, initial encounter   [place] [facility] [name], MD       Future Appointments             In 4 weeks Vanga, Corinn Skiff, MD [facility] [place] [facility]ology at [place]   In 4 weeks Gerard Frederick, NP [facility] at [facility]

## 2023-07-29 MED ORDER — CYCLOBENZAPRINE HCL 10 MG PO TABS: 10.0000 mg | ORAL_TABLET | Freq: Three times a day (TID) | ORAL | 1 refills | Status: DC | PRN

## 2023-07-30 ENCOUNTER — Ambulatory Visit: Payer: Self-pay

## 2023-07-30 NOTE — Patient Instructions (Signed)
 Visit Information  Thank you for taking time to visit with me today. Please don't hesitate to contact me if I can be of assistance to you.   Following are the goals we discussed today:  Patient will prioritize essential bills first and review credit card payment.    If you are experiencing a Mental Health or Behavioral Health Crisis or need someone to talk to, please call 911  Patient verbalizes understanding of instructions and care plan provided today and agrees to view in MyChart. Active MyChart status and patient understanding of how to access instructions and care plan via MyChart confirmed with patient.     No further follow up required: Patient does not request a follow up.  Tillman Gardener, BSW [place]  [facility], [facility] Social Worker Direct Dial: [phone]  Fax: [phone] Website: [email or web address]

## 2023-07-30 NOTE — Patient Outreach (Signed)
  Care Coordination   Initial Visit Note   07/30/2023 Name: Ellarose Brandi MRN: [id number] DOB: [birth year]  Amaranta Mehl is a 68 y.o. year old female who sees Fisher, Nancyann BRAVO, MD for primary care. I spoke with  Samule Jenkins Quan by phone today.  What matters to the patients health and wellness today?  Patients rent increased and credit card bills are causing a crisis.    Goals Addressed             This Visit's Progress    Care Coordination Activities       Interventions Today    Flowsheet Row Most Recent Value  Chronic Disease   Chronic disease during today's visit Hypertension (HTN), Atrial Fibrillation (AFib)  General Interventions   General Interventions Discussed/Reviewed General Interventions Discussed, General Interventions Reviewed  [Pt rent inceased to $550.Pt income is SSA, [employer] and Foodstamps.Credit card issues are causing a crisis with bills.Bills are within budget.SW provides budget counseling and pt will review priority of credit card payments.]  Education Interventions   Education Provided Provided Education  [SW provided education on the housing crisis. Finding housing under $550 is extremely difficult or there is a long waiting list.]              SDOH assessments and interventions completed:  Yes  SDOH Interventions Today    Flowsheet Row Most Recent Value  SDOH Interventions   Food Insecurity Interventions Intervention Not Indicated  [Foodstamps]  Housing Interventions Intervention Not Indicated  [Rent increased but able to afford]  Transportation Interventions Intervention Not Indicated  [Daughter/son provides transportation]  Utilities Interventions Intervention Not Indicated  [[employer]card to help pay the bill]        Care Coordination Interventions:  Yes, provided   Follow up plan: No further intervention required.   Encounter Outcome:  Patient Visit Completed

## 2023-08-12 ENCOUNTER — Ambulatory Visit: Payer: 59 | Admitting: Obstetrics and Gynecology

## 2023-08-18 ENCOUNTER — Other Ambulatory Visit: Payer: Self-pay | Admitting: Family Medicine

## 2023-08-18 DIAGNOSIS — E7800 Pure hypercholesterolemia, unspecified: Secondary | ICD-10-CM

## 2023-08-19 NOTE — Telephone Encounter (Signed)
 Request too soon for refill.  Requested Prescriptions  Pending Prescriptions Disp Refills   rosuvastatin (CRESTOR) 20 MG tablet [Pharmacy Med Name: Rosuvastatin Calcium 20 MG Oral Tablet] 100 tablet 2    Sig: TAKE 1 TABLET BY MOUTH DAILY     Cardiovascular:  Antilipid - Statins 2 Failed - 08/19/2023  9:59 AM      Failed - Lipid Panel in normal range within the last 12 months    Cholesterol, Total  Date Value Ref Range Status  06/08/2023 261 (H) 100 - 199 mg/dL Final  16/03/9603 540 (H) 100 - 199 mg/dL Final   LDL Chol Calc (NIH)  Date Value Ref Range Status  06/08/2023 166 (H) 0 - 99 mg/dL Final   LDL Direct  Date Value Ref Range Status  06/08/2023 168 (H) 0 - 99 mg/dL Final   HDL  Date Value Ref Range Status  06/08/2023 74 >39 mg/dL Final   Triglycerides  Date Value Ref Range Status  06/08/2023 118 0 - 149 mg/dL Final  98/04/9146 829 (H) 0 - 149 mg/dL Final         Passed - Cr in normal range and within 360 days    Creatinine, Ser  Date Value Ref Range Status  06/08/2023 0.97 0.57 - 1.00 mg/dL Final         Passed - Patient is not pregnant      Passed - Valid encounter within last 12 months    Recent Outpatient Visits           2 months ago Paroxysmal atrial fibrillation [facility])   [place] [facility] Malva Limes, MD   9 months ago Estrogen deficiency   [facility] Malva Limes, MD   1 year ago Acute non-recurrent pansinusitis   [place] [facility] [place], Marzella Schlein, MD   1 year ago Congestion of left ear   [facility] Merita Norton T, FNP   1 year ago Contusion of left wrist, initial encounter    [facility] Malva Limes, MD       Future Appointments             In 1 week Vanga, Loel Dubonnet, MD [facility] [place] [facility] at [place]   In 1 week Charlsie Quest, NP [facility] at [facility]

## 2023-08-24 ENCOUNTER — Other Ambulatory Visit: Payer: Self-pay | Admitting: Family Medicine

## 2023-08-24 NOTE — Telephone Encounter (Signed)
 Copied from CRM [id number]. Topic: Clinical - Medication Refill >> Aug 24, 2023 12:03 PM Dondra Prader A wrote: Most Recent Primary Care Visit:  Provider: Tora Kindred  Department[facility]  Visit Type: MEDICARE AWV, SEQUENTIAL  Date: 07/22/2023  Medication: oxyCODONE (ROXICODONE) 15 MG immediate release tablet  Has the patient contacted their pharmacy? No  Is this the correct pharmacy for this prescription? Yes  This is the patient's preferred pharmacy:  [facility] - [place], [place] - [address] Chew [place] [address] Phone: [phone] Fax: [phone]    Has the prescription been filled recently? No  Is the patient out of the medication? No  Has the patient been seen for an appointment in the last year OR does the patient have an upcoming appointment? Yes  Can we respond through MyChart? Yes  Agent: Please be advised that Rx refills may take up to 3 business days. We ask that you follow-up with your pharmacy.

## 2023-08-25 NOTE — Telephone Encounter (Signed)
 Requested medication (s) are due for refill today:   Provider to review  Requested medication (s) are on the active medication list:   Yes  Future visit scheduled:   No   Last ordered: 07/24/2023 #180, 0 refills  Non delegated refill    Requested Prescriptions  Pending Prescriptions Disp Refills   oxyCODONE (ROXICODONE) 15 MG immediate release tablet 180 tablet 0    Sig: Take 1-2 tablets (15-30 mg total) by mouth every 4 (four) hours as needed for pain.     Not Delegated - Analgesics:  Opioid Agonists Failed - 08/25/2023  3:10 PM      Failed - This refill cannot be delegated      Failed - Urine Drug Screen completed in last 360 days      Passed - Valid encounter within last 3 months    Recent Outpatient Visits           2 months ago Paroxysmal atrial fibrillation [facility])   [place] [facility] Malva Limes, MD   9 months ago Estrogen deficiency   [facility] Malva Limes, MD   1 year ago Acute non-recurrent pansinusitis   [facility] [place], Marzella Schlein, MD   1 year ago Congestion of left ear   [place] [facility] Merita Norton T, FNP   1 year ago Contusion of left wrist, initial encounter   [place] [facility] Malva Limes, MD       Future Appointments             Tomorrow Vanga, Loel Dubonnet, MD [facility] [place] [facility] at [facility], Lavonna Rua, NP [place] [facility] at [facility]

## 2023-08-26 ENCOUNTER — Ambulatory Visit: Payer: 59 | Admitting: Cardiology

## 2023-08-26 ENCOUNTER — Ambulatory Visit: Payer: 59 | Admitting: Gastroenterology

## 2023-08-26 ENCOUNTER — Telehealth: Payer: Self-pay | Admitting: Gastroenterology

## 2023-08-26 NOTE — Telephone Encounter (Signed)
 The patient called in to reschedule her appointment.

## 2023-08-26 NOTE — Progress Notes (Deleted)
 Cardiology Office Note:  .   Date:  08/26/2023  ID:  Courtney Ross, DOB [birth year], MRN [id number] PCP: Malva Limes, MD  [facility] Providers Cardiologist:  None { Click to update primary MD,subspecialty MD or APP then REFRESH:1}   History of Present Illness: .   Courtney Ross is a 68 y.o. female with a past medical history of dysphagia, chronic GERD, paroxysmal atrial fibrillation, anxiety, fatty liver disease, migraines, hypercholesterolemia, hypertension, restless leg syndrome, who is here today to follow-up on her paroxysmal atrial fibrillation.   Patient [facility] to [facility] with new onset atrial fibrillation with RVR.  She was status post colonoscopy with polypectomy and was found to be in atrial fibrillation RVR during recovery.  She was admitted to the hospital, received IV diltiazem drip and converted to sinus rhythm.  She was asymptomatic.  Discussed with cardiology she was considered medically stable for discharge on a low-dose of metoprolol.  Anticoagulation was not started at the time due to recent procedure however she was to be placed on a ZIO monitor to decide if she needed to be on anticoagulation for recurrent atrial fibrillation.  She was considered stable and able to be discharged from the hospital 05/21/2023.   She was last seen in clinic 07/10/2023.  Overall she had been doing well.  She noted on EKG today to take deep breath.  Occasional palpitations.  She was started on apixaban 5 mg twice daily for stroke prophylaxis and her aspirin was discontinued.  Metoprolol been increased from 12.5 mg to 25 mg daily.  She returns today  ROS: 10 point review of system has been reviewed and considered negative with exception was been listed in the HPI  Studies Reviewed: Marland Kitchen        Event Monitor (Zio) 07/02/23 Patient had a min HR of 58 bpm, max HR of 195 bpm, and avg HR of 86 bpm. Predominant underlying rhythm was Sinus Rhythm. 1 run of Ventricular  Tachycardia occurred lasting 5 beats with a max rate of 128 bpm (avg 111 bpm). 18 Supraventricular Tachycardia  runs occurred, the run with the fastest interval lasting 18 beats with a max rate of 188 bpm (avg 151 bpm); the run with the fastest interval was also the longest. Atrial Fibrillation occurred (<1% burden), ranging from 103-195 bpm (avg of 141 bpm), the  longest lasting 35 mins 37 secs with an avg rate of 133 bpm. Isolated SVEs were rare (<1.0%), SVE Couplets were rare (<1.0%), and SVE Triplets were rare (<1.0%). Isolated VEs were rare (<1.0%), VE Couplets were rare (<1.0%), and no VE Triplets were  present.    2D echo 05/20/2023 1. Left ventricular ejection fraction, by estimation, is 60 to 65%. The  left ventricle has normal function. The left ventricle has no regional  wall motion abnormalities. Left ventricular diastolic parameters were  normal.   2. Right ventricular systolic function is normal. The right ventricular  size is normal. Tricuspid regurgitation signal is inadequate for assessing  PA pressure.   3. Left atrial size was mildly dilated.   4. The mitral valve is normal in structure. Mild mitral valve  regurgitation. No evidence of mitral stenosis.   5. The aortic valve is bicuspid. Aortic valve regurgitation is not  visualized. No aortic stenosis is present.   6. The inferior vena cava is normal in size with greater than 50%  respiratory variability, suggesting right atrial pressure of 3 mmHg.  Risk Assessment/Calculations:    CHA2DS2-VASc  Score = 3  {Confirm score is correct.  If not, click here to update score.  REFRESH note.  :1} This indicates a 3.2% annual risk of stroke. The patient's score is based upon: CHF History: 0 HTN History: 1 Diabetes History: 0 Stroke History: 0 Vascular Disease History: 0 Age Score: 1 Gender Score: 1   {This patient has a significant risk of stroke if diagnosed with atrial fibrillation.  Please consider VKA or DOAC agent for  anticoagulation if the bleeding risk is acceptable.   You can also use the SmartPhrase .[email or web address] for documentation.   :[id number]} No BP recorded.  {Refresh Note OR Click here to enter BP  :1}***       Physical Exam:   VS:  There were no vitals taken for this visit.   Wt Readings from Last 3 Encounters:  07/22/23 180 lb (81.6 kg)  07/10/23 188 lb 12.8 oz (85.6 kg)  06/09/23 191 lb 3.2 oz (86.7 kg)    GEN: Well nourished, well developed in no acute distress NECK: No JVD; No carotid bruits CARDIAC: ***RRR, no murmurs, rubs, gallops RESPIRATORY:  Clear to auscultation without rales, wheezing or rhonchi  ABDOMEN: Soft, non-tender, non-distended EXTREMITIES:  No edema; No deformity   ASSESSMENT AND PLAN: .   Paroxysmal atrial fibrillation Essential hypertension Dyslipidemia Chronic GERD Chronic pain syndrome    {Are you ordering a CV Procedure (e.g. stress test, cath, DCCV, TEE, etc)?   Press F2        :[id number]}  Dispo: ***  Signed, Jodine Muchmore, NP

## 2023-08-28 NOTE — Telephone Encounter (Signed)
 Copied from CRM [id number]. Topic: Clinical - Medication Question >> Aug 28, 2023 12:03 PM Runell Gess P wrote: Reason for CRM: Pt stated she called on the 3rd for her refill on the Oxycodone.  She is completely out now.  She would like to know if it could be sent [facility] the [facility]

## 2023-08-29 ENCOUNTER — Other Ambulatory Visit: Payer: Self-pay | Admitting: Family Medicine

## 2023-09-01 ENCOUNTER — Other Ambulatory Visit: Payer: Self-pay | Admitting: Family Medicine

## 2023-09-01 DIAGNOSIS — E7800 Pure hypercholesterolemia, unspecified: Secondary | ICD-10-CM

## 2023-09-02 ENCOUNTER — Other Ambulatory Visit (HOSPITAL_COMMUNITY): Payer: Self-pay

## 2023-09-02 NOTE — Telephone Encounter (Signed)
 Unable to refill per protocol, Rx expired. Estradiol was discontinued. Too soon for refill for rosuvastatin.  Requested Prescriptions  Pending Prescriptions Disp Refills   estradiol (ESTRACE) 1 MG tablet [Pharmacy Med Name: Estradiol 1 MG Oral Tablet] 0 tablet 3    Sig: TAKE 1 TABLET BY MOUTH DAILY     OB/GYN:  Estrogens Failed - 09/02/2023  1:18 PM      Failed - Last BP in normal range    BP Readings from Last 1 Encounters:  07/10/23 (!) 139/91         Passed - Mammogram is up-to-date per Health Maintenance      Passed - Valid encounter within last 12 months    Recent Outpatient Visits           2 months ago Paroxysmal atrial fibrillation (HCC)   [place] [facility], MD   9 months ago Estrogen deficiency   [facility] Malva Limes, MD   1 year ago Acute non-recurrent pansinusitis   [place] [facility], Marzella Schlein, MD   1 year ago Congestion of left ear   [place] [facility] Merita Norton T, FNP   1 year ago Contusion of left wrist, initial encounter   [facility] Malva Limes, MD       Future Appointments             In 2 months Vanga, Loel Dubonnet, MD [facility] at [place]   In 2 months Furth, Cadence H, PA-C [place] [facility] at [facility]             rosuvastatin (CRESTOR) 20 MG tablet [Pharmacy Med Name: Rosuvastatin Calcium 20 MG Oral Tablet] 100 tablet 2    Sig: TAKE 1 TABLET BY MOUTH DAILY     Cardiovascular:  Antilipid - Statins 2 Failed - 09/02/2023  1:18 PM      Failed - Lipid Panel in normal range within the last 12 months    Cholesterol, Total  Date Value Ref Range Status  06/08/2023 261 (H) 100 - 199 mg/dL Final  16/03/9603 540 (H) 100 - 199 mg/dL Final   LDL Chol Calc (NIH)  Date Value Ref Range Status  06/08/2023 166 (H) 0 - 99 mg/dL Final   LDL Direct  Date Value Ref  Range Status  06/08/2023 168 (H) 0 - 99 mg/dL Final   HDL  Date Value Ref Range Status  06/08/2023 74 >39 mg/dL Final   Triglycerides  Date Value Ref Range Status  06/08/2023 118 0 - 149 mg/dL Final  98/04/9146 829 (H) 0 - 149 mg/dL Final         Passed - Cr in normal range and within 360 days    Creatinine, Ser  Date Value Ref Range Status  06/08/2023 0.97 0.57 - 1.00 mg/dL Final         Passed - Patient is not pregnant      Passed - Valid encounter within last 12 months    Recent Outpatient Visits           2 months ago Paroxysmal atrial fibrillation [facility])    [facility] L[facility], MD   9 months ago Estrogen deficiency   [facility] Malva Limes, MD   1 year ago Acute non-recurrent pansinusitis   [facility] [place], Marzella Schlein, MD   1  year ago Congestion of left ear   [place] [facility] Merita Norton T, FNP   1 year ago Contusion of left wrist, initial encounter   [facility] Malva Limes, MD       Future Appointments             In 2 months Vanga, Loel Dubonnet, MD [facility] at [place]   In 2 months Fransico Michael, Cadence H, PA-C [place] [facility] at [place]

## 2023-09-09 ENCOUNTER — Telehealth: Payer: Self-pay | Admitting: Pharmacy Technician

## 2023-09-09 ENCOUNTER — Other Ambulatory Visit (HOSPITAL_COMMUNITY): Payer: Self-pay

## 2023-09-09 NOTE — Telephone Encounter (Signed)
 Pharmacy Patient Advocate Encounter   Received notification from Onbase that prior authorization for OXYCODONE 15MG  TABLETS is required/requested.   Insurance verification completed.   The patient is insured through [facility] .   Per test claim: PA required; PA submitted to above mentioned insurance via CoverMyMeds Key/confirmation #/EOC [id number] Status is pending

## 2023-09-10 NOTE — Telephone Encounter (Signed)
 Pharmacy Patient Advocate Encounter  Received notification from [facility] that Prior Authorization for OXYCODONE 15MG  TABLETS has been DENIED.  Full denial letter will be uploaded to the media tab. See denial reason below.   PA #/Case ID/Reference #: [id number]

## 2023-09-24 ENCOUNTER — Other Ambulatory Visit: Payer: Self-pay | Admitting: Family Medicine

## 2023-09-24 DIAGNOSIS — N6001 Solitary cyst of right breast: Secondary | ICD-10-CM

## 2023-09-24 NOTE — Telephone Encounter (Signed)
 Copied from CRM [id number]. Topic: Clinical - Medication Refill >> Sep 24, 2023 10:49 AM Priscille Loveless wrote: Most Recent Primary Care Visit:  Provider: Tora Kindred  [facility]  Visit Type: MEDICARE AWV, SEQUENTIAL  Date: 07/22/2023  Medication: oxyCODONE (ROXICODONE) 15 MG immediate release tablet   Has the patient contacted their pharmacy? No  Is this the correct pharmacy for this prescription? Yes If no, delete pharmacy and type the correct one.  This is the patient's preferred pharmacy:   [facility] - [place], [place] - [address] Re[address] Chew [place] [address] Phone: [phone] Fax: [phone]   Has the prescription been filled recently? Yes  Is the patient out of the medication? Yes  Has the patient been seen for an appointment in the last year OR does the patient have an upcoming appointment? Yes  Can we respond through MyChart? Yes  Agent: Please be advised that Rx refills may take up to 3 business days. We ask that you follow-up with your pharmacy.

## 2023-09-25 MED ORDER — OXYCODONE HCL 15 MG PO TABS
15.0000 mg | ORAL_TABLET | ORAL | 0 refills | Status: DC | PRN
Start: 2023-09-25 — End: 2023-10-22

## 2023-09-25 NOTE — Telephone Encounter (Signed)
 Requested medication (s) are due for refill today: review  Requested medication (s) are on the active medication list: yes  Last refill:  08/30/23 #180/0  Future visit scheduled: no  Notes to clinic:  Unable to refill per protocol, cannot delegate.      Requested Prescriptions  Pending Prescriptions Disp Refills   oxyCODONE (ROXICODONE) 15 MG immediate release tablet 180 tablet 0    Sig: Take 1-2 tablets (15-30 mg total) by mouth every 4 (four) hours as needed. for pain     Not Delegated - Analgesics:  Opioid Agonists Failed - 09/25/2023 12:19 PM      Failed - This refill cannot be delegated      Failed - Urine Drug Screen completed in last 360 days      Failed - Valid encounter within last 3 months    Recent Outpatient Visits   None     Future Appointments             In 1 month Vanga, Loel Dubonnet, MD [facility] at [place]   In 1 month Furth, Cadence H, PA-C [place] [facility] at [place]

## 2023-10-13 ENCOUNTER — Ambulatory Visit
Admission: RE | Admit: 2023-10-13 | Discharge: 2023-10-13 | Disposition: A | Discharge status: Home / Self Care | Source: Ambulatory Visit | Attending: Family Medicine | Admitting: Family Medicine

## 2023-10-13 DIAGNOSIS — R928 Other abnormal and inconclusive findings on diagnostic imaging of breast: Secondary | ICD-10-CM | POA: Diagnosis not present

## 2023-10-13 DIAGNOSIS — N6312 Unspecified lump in the right breast, upper inner quadrant: Secondary | ICD-10-CM | POA: Diagnosis not present

## 2023-10-13 DIAGNOSIS — R92323 Mammographic fibroglandular density, bilateral breasts: Secondary | ICD-10-CM | POA: Diagnosis not present

## 2023-10-13 DIAGNOSIS — N6001 Solitary cyst of right breast: Secondary | ICD-10-CM | POA: Insufficient documentation

## 2023-10-22 ENCOUNTER — Other Ambulatory Visit: Payer: Self-pay | Admitting: Family Medicine

## 2023-10-22 NOTE — Telephone Encounter (Unsigned)
 Copied from CRM [id number]. Topic: Clinical - Medication Refill >> Oct 22, 2023  1:41 PM Opal Bill wrote: Most Recent Primary Care Visit:  Provider: Jaunita Messier  Department[facility]  Visit Type: MEDICARE AWV, SEQUENTIAL  Date: 07/22/2023  Medication: oxyCODONE  (ROXICODONE ) 15 MG immediate release tablet   Has the patient contacted their pharmacy? No. Pt always has to call it in as this is a controlled substance  Is this the correct pharmacy for this prescription? Yes If no, delete pharmacy and type the correct one.  This is the patient's preferred pharmacy:  [facility] - [place], [place] - [address] Macadam [place] [address] Phone: [phone] Fax: [phone]  Has the prescription been filled recently? Yes  Is the patient out of the medication? Yes, took last one today  Has the patient been seen for an appointment in the last year OR does the patient have an upcoming appointment? Yes  Can we respond through MyChart? Yes  Agent: Please be advised that Rx refills may take up to 3 business days. We ask that you follow-up with your pharmacy.

## 2023-10-24 NOTE — Telephone Encounter (Signed)
 Requested medication (s) are due for refill today: Yes  Requested medication (s) are on the active medication list: Yes  Last refill:  09/25/23  Future visit scheduled: No  Notes to clinic:  Unable to refill per protocol, cannot delegate.      Requested Prescriptions  Pending Prescriptions Disp Refills   oxyCODONE  (ROXICODONE ) 15 MG immediate release tablet 180 tablet 0    Sig: Take 1-2 tablets (15-30 mg total) by mouth every 4 (four) hours as needed. for pain     Not Delegated - Analgesics:  Opioid Agonists Failed - 10/24/2023  1:05 AM      Failed - This refill cannot be delegated      Failed - Urine Drug Screen completed in last 360 days      Failed - Valid encounter within last 3 months    Recent Outpatient Visits   None     Future Appointments             In 1 week Vanga, Elson Halon, MD [facility] at [place]   In 1 week Gennaro Khat, Cadence H, PA-C [place] [facility] at [place]

## 2023-10-26 MED ORDER — OXYCODONE HCL 15 MG PO TABS
15.0000 mg | ORAL_TABLET | ORAL | 0 refills | Status: DC | PRN
Start: 2023-10-26 — End: 2023-11-23

## 2023-10-29 ENCOUNTER — Other Ambulatory Visit: Payer: Self-pay

## 2023-11-04 ENCOUNTER — Ambulatory Visit: Admitting: Gastroenterology

## 2023-11-04 ENCOUNTER — Ambulatory Visit: Admitting: Medical

## 2023-11-04 NOTE — Progress Notes (Deleted)
  Cardiology Office Note:  .   Date:  11/04/2023  ID:  Courtney Ross, DOB 12/10/1955, MRN [id number] PCP: Lamon Pillow, MD  [facility] Providers Cardiologist:  None { Click to update primary MD,subspecialty MD or APP then REFRESH:1}   History of Present Illness: .   Courtney Ross is a 68 y.o. female with a history of dysphagia, chronic GERD, A-fib, anxiety, fatty liver disease, migraines, hypercholesterolemia, hypertension, and restless leg syndrome who is here for follow-up.  The patient presented to [facility] on 05/20/2023 with new onset A-fib with RVR.  She was status post colonoscopy and polypectomy and was found to be in A-fib RVR during recovery.  She was admitted to the hospital started on IV Dilt with conversion to normal sinus rhythm.  She was discharged on low-dose metoprolol .  Anticoagulation was not started at the time due to recent procedure, however she was placed on a heart monitor with plan at discussed anticoagulation as outpatient.  Heart monitor showed normal sinus rhythm with 1 run of VT lasting 5 beats, 18 runs of SVT, less than 1% A-fib burden.  Patient was started on Eliquis  5 mg twice daily.  Today,  ROS: ***  Studies Reviewed: .        *** Risk Assessment/Calculations:   {Does this patient have ATRIAL FIBRILLATION?[phone]} No BP recorded.  {Refresh Note OR Click here to enter BP  :1}***       Physical Exam:   VS:  There were no vitals taken for this visit.   Wt Readings from Last 3 Encounters:  07/22/23 180 lb (81.6 kg)  07/10/23 188 lb 12.8 oz (85.6 kg)  06/09/23 191 lb 3.2 oz (86.7 kg)    GEN: Well nourished, well developed in no acute distress NECK: No JVD; No carotid bruits CARDIAC: ***RRR, no murmurs, rubs, gallops RESPIRATORY:  Clear to auscultation without rales, wheezing or rhonchi  ABDOMEN: Soft, non-tender, non-distended EXTREMITIES:  No edema; No deformity   ASSESSMENT AND PLAN: .   ***    {Are you ordering a CV  Procedure (e.g. stress test, cath, DCCV, TEE, etc)?   Press F2        :[id number]}  Dispo: ***  Signed, Yanin Muhlestein Rebekah Canada, PA-C

## 2023-11-17 ENCOUNTER — Other Ambulatory Visit: Payer: Self-pay | Admitting: Family Medicine

## 2023-11-18 ENCOUNTER — Other Ambulatory Visit (HOSPITAL_COMMUNITY): Payer: Self-pay

## 2023-11-23 ENCOUNTER — Other Ambulatory Visit: Payer: Self-pay | Admitting: Family Medicine

## 2023-11-23 NOTE — Telephone Encounter (Signed)
 Copied from CRM [id number]. Topic: Clinical - Medication Refill >> Nov 23, 2023  3:15 PM Fredrica W wrote: Medication: oxyCODONE  (ROXICODONE ) 15 MG immediate release tablet  Has the patient contacted their pharmacy? No - pateint always calls  (Agent: If no, request that the patient contact the pharmacy for the refill. If patient does not wish to contact the pharmacy document the reason why and proceed with request.) (Agent: If yes, when and what did the pharmacy advise?)  This is the patient's preferred pharmacy:  [facility] [place], [place] - [address] [place] [place] [address] Phone: [phone] Fax: [phone]  Is this the correct pharmacy for this prescription? Yes If no, delete pharmacy and type the correct one.   Has the prescription been filled recently? Yes  Is the patient out of the medication? Yes  Has the patient been seen for an appointment in the last year OR does the patient have an upcoming appointment? Yes  Can we respond through MyChart? No  Agent: Please be advised that Rx refills may take up to 3 business days. We ask that you follow-up with your pharmacy.

## 2023-11-24 MED ORDER — OXYCODONE HCL 15 MG PO TABS: 15.0000 mg | ORAL_TABLET | ORAL | 0 refills | Status: DC | PRN

## 2023-11-24 NOTE — Telephone Encounter (Signed)
 Requested medication (s) are due for refill today:   Provider to review  Requested medication (s) are on the active medication list:   Yes  Future visit scheduled:   Yes AWV 07/27/2024   LOV 07/22/2023   Last ordered: 10/26/2023 #180, 0 refills  Non delegated refill    Requested Prescriptions  Pending Prescriptions Disp Refills   oxyCODONE  (ROXICODONE ) 15 MG immediate release tablet 180 tablet 0    Sig: Take 1-2 tablets (15-30 mg total) by mouth every 4 (four) hours as needed. for pain     Not Delegated - Analgesics:  Opioid Agonists Failed - 11/24/2023  3:00 PM      Failed - This refill cannot be delegated      Failed - Urine Drug Screen completed in last 360 days      Failed - Valid encounter within last 3 months    Recent Outpatient Visits   None

## 2023-11-26 ENCOUNTER — Other Ambulatory Visit: Payer: Self-pay | Admitting: *Deleted

## 2023-12-01 ENCOUNTER — Ambulatory Visit: Attending: Cardiology | Admitting: Cardiology

## 2023-12-01 ENCOUNTER — Ambulatory Visit: Admitting: Cardiology

## 2023-12-01 ENCOUNTER — Encounter: Payer: Self-pay | Admitting: Cardiology

## 2023-12-01 VITALS — BP 130/70 | HR 71 | Ht 68.0 in | Wt 185.5 lb

## 2023-12-01 DIAGNOSIS — Z79899 Other long term (current) drug therapy: Secondary | ICD-10-CM | POA: Diagnosis not present

## 2023-12-01 DIAGNOSIS — K219 Gastro-esophageal reflux disease without esophagitis: Secondary | ICD-10-CM | POA: Diagnosis not present

## 2023-12-01 DIAGNOSIS — E785 Hyperlipidemia, unspecified: Secondary | ICD-10-CM | POA: Diagnosis not present

## 2023-12-01 DIAGNOSIS — I10 Essential (primary) hypertension: Secondary | ICD-10-CM | POA: Diagnosis not present

## 2023-12-01 DIAGNOSIS — G894 Chronic pain syndrome: Secondary | ICD-10-CM | POA: Diagnosis not present

## 2023-12-01 DIAGNOSIS — I480 Paroxysmal atrial fibrillation: Secondary | ICD-10-CM

## 2023-12-01 MED ORDER — NEXLETOL 180 MG PO TABS: 180.0000 mg | ORAL_TABLET | Freq: Every day | ORAL | 3 refills | Status: DC

## 2023-12-01 NOTE — Progress Notes (Deleted)
 Cardiology Office Note:  .   Date:  12/01/2023  ID:  Courtney Ross, DOB June 26, 1955, MRN [id number] PCP: Lamon Pillow, MD  [facility] Providers Cardiologist:  None { Click to update primary MD,subspecialty MD or APP then REFRESH:1}   History of Present Illness: .   Courtney Ross is a 68 y.o. female with a history of dysphagia, chronic GERD, A-fib, anxiety, fatty liver disease, migraines, hypercholesterolemia, hypertension, and restless leg syndrome who is here for follow-up.  The patient presented to [facility] on 05/20/2023 with new onset A-fib with RVR.  She was status post colonoscopy and polypectomy and was found to be in A-fib RVR during recovery.  She was admitted to the hospital started on IV Dilt with conversion to normal sinus rhythm.  She was discharged on low-dose metoprolol .  Anticoagulation was not started at the time due to recent procedure, however she was placed on a heart monitor with plan at discussed anticoagulation as outpatient.  Heart monitor showed normal sinus rhythm with 1 run of VT lasting 5 beats, 18 runs of SVT, less than 1% A-fib burden.  Patient was started on Eliquis  5 mg twice daily.  She was last seen in clinic 1/78/25 overall doing fairly well.  She had occasional need to take a deep breath with occasional palpitations.  She had been compliant with her current medication regimen.  She was placed on a ZIO XT monitor to determine A-fib burden if she needed anticoagulation.  ZIO XT monitor revealed she had continued episodes of atrial fibrillation with the longest lasting 1 hour and 5 minutes with a CHA2DS2-VASc score of 3 she been started on apixaban  5 mg twice daily for stroke prophylaxis and aspirin  have been discontinued.  Metoprolol  been increased from 12.5 mg to 25 mg daily.  She returns to clinic today  ROS: 10 point review of system has been reviewed and considered negative except ones been listed in the HPI  Studies Reviewed: Aaron Aas        Event monitor 07/02/2023 Patch Wear Time:  13 days and 23 hours (2024-12-05T23:01:29-0500 to 2024-12-19T23:01:21-0500)   Normal sinus rhythm Patient had a min HR of 58 bpm, max HR of 195 bpm, and avg HR of 86 bpm.   1 run of Ventricular Tachycardia occurred lasting 5 beats with a max rate of 128 bpm (avg 111 bpm).    18 Supraventricular Tachycardia runs occurred, the run with the fastest interval lasting 18 beats with a max rate of 188 bpm (avg 151 bpm); the run with the fastest interval was also the longest.    Atrial Fibrillation occurred (<1% burden), ranging from 103-195 bpm (avg of 141 bpm), the longest lasting 35 mins 37 secs with an avg rate of 133 bpm.    Isolated SVEs were rare (<1.0%), SVE Couplets were rare (<1.0%), and SVE Triplets were rare (<1.0%).  Isolated VEs were rare (<1.0%), VE Couplets were rare (<1.0%), and no VE Triplets were present.    Patient triggered event (2) associated with normal sinus rhythm  2D echo 05/20/2023  1. Left ventricular ejection fraction, by estimation, is 60 to 65%. The  left ventricle has normal function. The left ventricle has no regional  wall motion abnormalities. Left ventricular diastolic parameters were  normal.   2. Right ventricular systolic function is normal. The right ventricular  size is normal. Tricuspid regurgitation signal is inadequate for assessing  PA pressure.   3. Left atrial size was mildly dilated.   4. The  mitral valve is normal in structure. Mild mitral valve  regurgitation. No evidence of mitral stenosis.   5. The aortic valve is bicuspid. Aortic valve regurgitation is not  visualized. No aortic stenosis is present.   6. The inferior vena cava is normal in size with greater than 50%  respiratory variability, suggesting right atrial pressure of 3 mmHg.  Risk Assessment/Calculations:     CHA2DS2-VASc Score = 3  {Confirm score is correct.  If not, click here to update score.  REFRESH note.  :1} This indicates a  3.2% annual risk of stroke. The patient's score is based upon: CHF History: 0 HTN History: 1 Diabetes History: 0 Stroke History: 0 Vascular Disease History: 0 Age Score: 1 Gender Score: 1   {This patient has a significant risk of stroke if diagnosed with atrial fibrillation.  Please consider VKA or DOAC agent for anticoagulation if the bleeding risk is acceptable.   You can also use the SmartPhrase .[email or web address] for documentation.   :[id number]}  No BP recorded.  {Refresh Note OR Click here to enter BP  :1}***       Physical Exam:   VS:  There were no vitals taken for this visit.   Wt Readings from Last 3 Encounters:  07/22/23 180 lb (81.6 kg)  07/10/23 188 lb 12.8 oz (85.6 kg)  06/09/23 191 lb 3.2 oz (86.7 kg)    GEN: Well nourished, well developed in no acute distress NECK: No JVD; No carotid bruits CARDIAC: ***RRR, no murmurs, rubs, gallops RESPIRATORY:  Clear to auscultation without rales, wheezing or rhonchi  ABDOMEN: Soft, non-tender, non-distended EXTREMITIES:  No edema; No deformity   ASSESSMENT AND PLAN: .   Paroxysmal atrial fibrillation Primary hypertension Dyslipidemia Chronic pain syndrome Chronic GERD    {Are you ordering a CV Procedure (e.g. stress test, cath, DCCV, TEE, etc)?   Press F2        :[id number]}  Dispo: ***  Signed, Deven Furia, NP

## 2023-12-01 NOTE — Progress Notes (Signed)
 Cardiology Office Note:  .   Date:  12/01/2023  ID:  [name], DOB November 23, 1955, MRN [id number] PCP: Lamon Pillow, MD  [facility] Providers Cardiologist:  None    History of Present Illness: .   Courtney Ross is a 68 y.o. female with a history of dysphagia, chronic GERD, A-fib, anxiety, fatty liver disease, migraines, hypercholesterolemia, hypertension, and restless leg syndrome who is here for follow-up.  The patient presented to [facility] on 05/20/2023 with new onset A-fib with RVR.  She was status post colonoscopy and polypectomy and was found to be in A-fib RVR during recovery.  She was admitted to the hospital started on IV Dilt with conversion to normal sinus rhythm.  She was discharged on low-dose metoprolol .  Anticoagulation was not started at the time due to recent procedure, however she was placed on a heart monitor with plan at discussed anticoagulation as outpatient.  Heart monitor showed normal sinus rhythm with 1 run of VT lasting 5 beats, 18 runs of SVT, less than 1% A-fib burden.  Patient was started on Eliquis  5 mg twice daily.  She was last seen in clinic 1/78/25 overall doing fairly well.  She had occasional need to take a deep breath with occasional palpitations.  She had been compliant with her current medication regimen.  She was placed on a ZIO XT monitor to determine A-fib burden if she needed anticoagulation.  ZIO XT monitor revealed she had continued episodes of atrial fibrillation with the longest lasting 1 hour and 5 minutes with a CHA2DS2-VASc score of 3 she been started on apixaban  5 mg twice daily for stroke prophylaxis and aspirin  have been discontinued.  Metoprolol  been increased from 12.5 mg to 25 mg daily.  She returns to clinic today.  Overall with a cardiac perspective she has been doing well.  She continues to note occasional palpitations.  She had a family friend that has provider taken him off of metoprolol  so she tried to take himself  off metoprolol  and worsening palpitations and went back on her previously scheduled dose.  She continues to have occasional breakthrough palpitations in the evening.  She continues to take her apixaban  as scheduled denies any issues with bleeding with no blood noted in her stool or urine.  The remainder of her medications she states that she has been compliant with.  She is having worsening issues with restless leg syndrome since being on statin therapy and is questioning today whether she has any other options.  She denies any hospitalizations or visits to the emergency department.  ROS: 10 point review of system has been reviewed and considered negative except ones been listed in the HPI  Studies Reviewed: Aaron Aas   EKG Interpretation Date/Time:  Tuesday December 01 2023 15:26:20 EDT Ventricular Rate:  71 PR Interval:  136 QRS Duration:  92 QT Interval:  420 QTC Calculation: 456 R Axis:   -10  Text Interpretation: Normal sinus rhythm Normal ECG When compared with ECG of 10-Jul-2023 15:22, No significant change was found Confirmed by Ronald Cockayne ([id number]) on 12/01/2023 3:27:58 PM    Event monitor 07/02/2023 Patch Wear Time:  13 days and 23 hours (2024-12-05T23:01:29-0500 to 2024-12-19T23:01:21-0500)   Normal sinus rhythm Patient had a min HR of 58 bpm, max HR of 195 bpm, and avg HR of 86 bpm.   1 run of Ventricular Tachycardia occurred lasting 5 beats with a max rate of 128 bpm (avg 111 bpm).    18 Supraventricular Tachycardia runs  occurred, the run with the fastest interval lasting 18 beats with a max rate of 188 bpm (avg 151 bpm); the run with the fastest interval was also the longest.    Atrial Fibrillation occurred (<1% burden), ranging from 103-195 bpm (avg of 141 bpm), the longest lasting 35 mins 37 secs with an avg rate of 133 bpm.    Isolated SVEs were rare (<1.0%), SVE Couplets were rare (<1.0%), and SVE Triplets were rare (<1.0%).  Isolated VEs were rare (<1.0%), VE Couplets were rare  (<1.0%), and no VE Triplets were present.    Patient triggered event (2) associated with normal sinus rhythm  2D echo 05/20/2023  1. Left ventricular ejection fraction, by estimation, is 60 to 65%. The  left ventricle has normal function. The left ventricle has no regional  wall motion abnormalities. Left ventricular diastolic parameters were  normal.   2. Right ventricular systolic function is normal. The right ventricular  size is normal. Tricuspid regurgitation signal is inadequate for assessing  PA pressure.   3. Left atrial size was mildly dilated.   4. The mitral valve is normal in structure. Mild mitral valve  regurgitation. No evidence of mitral stenosis.   5. The aortic valve is bicuspid. Aortic valve regurgitation is not  visualized. No aortic stenosis is present.   6. The inferior vena cava is normal in size with greater than 50%  respiratory variability, suggesting right atrial pressure of 3 mmHg.  Risk Assessment/Calculations:     CHA2DS2-VASc Score = 3   This indicates a 3.2% annual risk of stroke. The patient's score is based upon: CHF History: 0 HTN History: 1 Diabetes History: 0 Stroke History: 0 Vascular Disease History: 0 Age Score: 1 Gender Score: 1             Physical Exam:   VS:  BP 130/70 (BP Location: Left Arm, Patient Position: Sitting, Cuff Size: Normal)   Pulse 71   Ht 5\' 8"  (1.727 m)   Wt 185 lb 8 oz (84.1 kg)   SpO2 97%   BMI 28.21 kg/m    Wt Readings from Last 3 Encounters:  12/01/23 185 lb 8 oz (84.1 kg)  07/22/23 180 lb (81.6 kg)  07/10/23 188 lb 12.8 oz (85.6 kg)    GEN: Well nourished, well developed in no acute distress NECK: No JVD; No carotid bruits CARDIAC: RRR, no murmurs, rubs, gallops RESPIRATORY:  Clear to auscultation without rales, wheezing or rhonchi  ABDOMEN: Soft, non-tender, non-distended EXTREMITIES:  No edema; No deformity   ASSESSMENT AND PLAN: .   Paroxysmal atrial fibrillation which she is maintaining sinus  rhythm on EKG today with a rate of 71 with no acute ischemic changes noted.  She is continued on apixaban  5 mg twice daily for CHA2DS2-VASc of at least 3 for stroke prophylaxis.  She is continued on metoprolol  25 mg daily with an additional metoprolol  for elevated heart rate and palpitations lasting greater than 15 minutes.  She has been compliant with her apixaban  with no noted issues of bleeding with no blood noted in her urine or stool.  Primary hypertension with a blood pressure today 130/70.  Blood pressures remain stable.  Metoprolol  has been continued at 25 mg daily.  She has been encouraged to continue to monitor pressures 1 to 2 hours postmedication administration as well.  Dyslipidemia with last LDL 166 on 06/08/2023.  Unfortunately due to restless legs and patient has not not been able to take rosuvastatin  but a couple of  days a week.  She is asking if there are anything different to try at this time.  10-year risk of CVD is 10.7% on the ASCVD calculator.  Recommendation is moderate intensity statin.  With her being unable to tolerate rosuvastatin  due to restless legs we are sending her in a prescription for Nexletol 180 mg daily.  She will have an updated lipid and hepatic panel in 10 weeks.  Patient advised that she is unable to tolerate rosuvastatin  when she starts on Nexletol she can stop taking the rosuvastatin  to see if it helps with some of the discomfort she is having from her legs but to continue taking her co-Q10.  Chronic pain syndrome which she is continued on chronic pain medications.  Ongoing management per PCP.  Chronic GERD which she is continued on omeprazole .  She stated that she has not followed up with GI as of yet but symptoms have not improved.  She has been advised to notify them that she is now on a blood thinner prior to any procedures being completed as she will need clearance on how to stop her medication prior to.       Dispo: Patient return to clinic to see MD/APP  in 3 to 4 months or sooner if needed  Signed, Kayonna Lawniczak, NP

## 2023-12-01 NOTE — Patient Instructions (Signed)
 Medication Instructions:  Your physician recommends the following medication changes.  STOP TAKING: Rosuvastatin  when you get Nexletol  START TAKING: Nexletol 180 mg   *If you need a refill on your cardiac medications before your next appointment, please call your pharmacy*  Lab Work: Your provider would like for you to return in 10 weeks to have the following labs drawn: Lipid and Hepatic panel.   Please go to [facility] [address] ([facility] [address], [place] [address] You do not need an appointment.  They are open from 8 am- 4:30 pm.  Lunch from 1:00 pm- 2:00 pm You DO need to be fasting.   You may also go to one of the following [facility]:  [address] [place], [place] [address] Phone: [phone] Lab hours: Mon-Fri 8 am- 5 pm    Lunch 12 pm- 1 pm  [address] [place],  [place]  [address]  [place]  Phone: [phone] Lab hours: 7 am- 4 pm Lunch 12 pm-1 pm   [address] [place],  [place]  [address]  [place]  Phone: [phone] Lab hours: Mon-Fri 8 am- 5 pm    Lunch 12 pm- 1 pm  If you have labs (blood work) drawn today and your tests are completely normal, you will receive your results only by: MyChart Message (if you have MyChart) OR A paper copy in the mail If you have any lab test that is abnormal or we need to change your treatment, we will call you to review the results.  Testing/Procedures: No test ordered today   Follow-Up: At [facility], you and your health needs are our priority.  As part of our continuing mission to provide you with exceptional heart care, our providers are all part of one team.  This team includes your primary Cardiologist (physician) and Advanced Practice Providers or APPs (Physician Assistants and Nurse Practitioners) who all work together to provide you with the care you need, when you need it.  Your next appointment:   3 - 4 month(s)  Provider:   Ronald Cockayne, NP

## 2023-12-02 ENCOUNTER — Telehealth: Payer: Self-pay | Admitting: Pharmacy Technician

## 2023-12-02 NOTE — Telephone Encounter (Signed)
 Absolutely.

## 2023-12-02 NOTE — Telephone Encounter (Signed)
 Left a message for the patient to call back.

## 2023-12-02 NOTE — Telephone Encounter (Signed)
 Pharmacy Patient Advocate Encounter   Received notification from CoverMyMeds that prior authorization for nexletol 180mg  tablets is required/requested.   Insurance verification completed.   The patient is insured through [facility] .   Per test claim: PA required; PA started via CoverMyMeds. KEY [id number] . Please see clinical question(s) below that I am not finding the answer to in their chart and advise.    last LDL 166 on 06/08/2023. Insurance is requiring labs within 120 days. Can patient get updated labs please? Thank you!

## 2023-12-03 NOTE — Telephone Encounter (Signed)
Called patient left message to return call to the office  °

## 2023-12-03 NOTE — Telephone Encounter (Signed)
 Patient returned call - advised of note - patient verbalized understanding and agreement with plan

## 2023-12-04 NOTE — Telephone Encounter (Signed)
 Patient seen on 12/01/2023

## 2023-12-07 MED ORDER — METOPROLOL SUCCINATE ER 25 MG PO TB24: 25.0000 mg | ORAL_TABLET | Freq: Every day | ORAL | 0 refills | Status: DC

## 2023-12-17 DIAGNOSIS — M79671 Pain in right foot: Secondary | ICD-10-CM | POA: Diagnosis not present

## 2023-12-23 ENCOUNTER — Telehealth: Payer: Self-pay | Admitting: Family Medicine

## 2023-12-23 NOTE — Telephone Encounter (Signed)
 Copied from CRM [id number]. Topic: Clinical - Medication Refill >> Dec 23, 2023  4:01 PM Elle L wrote: Medication: oxyCODONE  (ROXICODONE ) 15 MG immediate release tablet  Has the patient contacted their pharmacy? Yes  This is the patient's preferred pharmacy:  [facility] - [place], [place] - [address] [place] [address] Phone: [phone] Fax: [phone]  Is this the correct pharmacy for this prescription? Yes  Has the prescription been filled recently? Yes  Is the patient out of the medication? No, has a few left.   Has the patient been seen for an appointment in the last year OR does the patient have an upcoming appointment? Yes  Can we respond through MyChart? Yes  Agent: Please be advised that Rx refills may take up to 3 business days. We ask that you follow-up with your pharmacy.

## 2023-12-25 NOTE — Telephone Encounter (Signed)
 Requested medications are due for refill today.  yes  Requested medications are on the active medications list.  yes  Last refill. 11/24/2023 #180 0 rf  Future visit scheduled.   yes  Notes to clinic.  Refill not delegated.    Requested Prescriptions  Pending Prescriptions Disp Refills   oxyCODONE  (ROXICODONE ) 15 MG immediate release tablet 180 tablet 0    Sig: Take 1-2 tablets (15-30 mg total) by mouth every 4 (four) hours as needed. for pain     Not Delegated - Analgesics:  Opioid Agonists Failed - 12/25/2023  5:37 PM      Failed - This refill cannot be delegated      Failed - Urine Drug Screen completed in last 360 days      Failed - Valid encounter within last 3 months    Recent Outpatient Visits   None     Future Appointments             In 2 months Hammock, Tylene, NP [facility]

## 2023-12-28 NOTE — Telephone Encounter (Signed)
 Is overdue for follow up o.v. need to schedule appointment before refill can be approved.

## 2024-01-01 ENCOUNTER — Ambulatory Visit (INDEPENDENT_AMBULATORY_CARE_PROVIDER_SITE_OTHER): Admitting: Family Medicine

## 2024-01-01 ENCOUNTER — Encounter: Payer: Self-pay | Admitting: Family Medicine

## 2024-01-01 VITALS — BP 133/79 | HR 83 | Resp 18 | Ht 68.0 in | Wt 186.7 lb

## 2024-01-01 DIAGNOSIS — I10 Essential (primary) hypertension: Secondary | ICD-10-CM

## 2024-01-01 DIAGNOSIS — I4891 Unspecified atrial fibrillation: Secondary | ICD-10-CM

## 2024-01-01 DIAGNOSIS — M5412 Radiculopathy, cervical region: Secondary | ICD-10-CM

## 2024-01-01 DIAGNOSIS — E785 Hyperlipidemia, unspecified: Secondary | ICD-10-CM | POA: Diagnosis not present

## 2024-01-01 DIAGNOSIS — M51379 Other intervertebral disc degeneration, lumbosacral region without mention of lumbar back pain or lower extremity pain: Secondary | ICD-10-CM

## 2024-01-01 MED ORDER — EZETIMIBE 10 MG PO TABS: 10.0000 mg | ORAL_TABLET | Freq: Every day | ORAL | 2 refills | Status: DC

## 2024-01-01 MED ORDER — OXYCODONE HCL 15 MG PO TABS: 15.0000 mg | ORAL_TABLET | ORAL | 0 refills | Status: DC | PRN

## 2024-01-01 NOTE — Patient Instructions (Signed)
 Marland Kitchen  Please review the attached list of medications and notify my office if there are any errors.   . Please bring all of your medications to every appointment so we can make sure that our medication list is the same as yours.

## 2024-01-01 NOTE — Progress Notes (Signed)
 Established patient visit   Patient: Courtney Ross   DOB: [birth year]   68 y.o. Female  MRN: [id number] Visit Date: 01/01/2024  Today's healthcare provider: Nancyann Perry, MD   Chief Complaint  Patient presents with   Medical Management of Chronic Issues    Needs medications refills.   Subjective    Discussed the use of AI scribe software for clinical note transcription with the patient, who gave verbal consent to proceed.  History of Present Illness   Courtney Ross is a 68 year old female who presents for a routine checkup.  She is managing her cholesterol with rosuvastatin , taking 10 mg every other day, although she sometimes takes it only once a week due to forgetfulness. She's cardiology for a-fib and was prescribed bempidoic acid, but this has apparently not been approved by her insurance.   She experiences restless leg syndrome and is cautious about medication interactions. She uses Valium  occasionally for pain relief and mentions using two 5% prescription patches, which she finds effective in managing pain.  Her past medical history includes atrial fibrillation, and she notes that her insurance does not cover certain medications unless there are heart blockages. She is aware of the limitations of her current medication regimen and is exploring alternatives.   She continues to do well on current pain medication regiment for chronic back pain and DDD.     Lab Results  Component Value Date   CHOL 261 (H) 06/08/2023   HDL 74 06/08/2023   LDLCALC 166 (H) 06/08/2023   LDLDIRECT 168 (H) 06/08/2023   TRIG 118 06/08/2023   CHOLHDL 3.5 06/08/2023     Medications: Outpatient Medications Prior to Visit  Medication Sig   apixaban  (ELIQUIS ) 5 MG TABS tablet Take 1 tablet (5 mg total) by mouth 2 (two) times daily.   buPROPion  (WELLBUTRIN  SR) 150 MG 12 hr tablet TAKE 1 TABLET BY MOUTH 3 TIMES  DAILY   Cholecalciferol  (VITAMIN D3) 5000 UNITS CAPS Take 5,000  Units by mouth daily.    Coenzyme Q10 200 MG capsule Take 200 mg by mouth daily.   cyclobenzaprine  (FLEXERIL ) 10 MG tablet Take 1 tablet (10 mg total) by mouth 3 (three) times daily as needed for muscle spasms.   diazepam  (VALIUM ) 10 MG tablet TAKE ONE TABLET BY MOUTH EVERY 6 HOURS AS NEEDED FOR ANXIETY   diclofenac  Sodium (VOLTAREN ) 1 % GEL SMARTSIG:1 Gram(s) Topical 4 Times Daily PRN   estrogens , conjugated, (PREMARIN ) 0.625 MG tablet Take 1 tablet (0.625 mg total) by mouth daily. Take daily for 21 days then do not take for 7 days.   fluticasone  (FLONASE ) 50 MCG/ACT nasal spray Place 1 spray into both nostrils daily as needed for allergies or rhinitis.   lidocaine  (LIDODERM ) 5 % APPLY 1 PATCH TOPICALLY TO SKIN  DAILY MAY BE LEFT ON UP TO 12  HOURS WITHIN A 24 HOUR PERIOD AS DIRECTED BY MD   linaclotide  (LINZESS ) 290 MCG CAPS capsule Take 1 capsule (290 mcg total) by mouth daily before breakfast.   meloxicam  (MOBIC ) 15 MG tablet TAKE 1 TABLET BY MOUTH AT  BEDTIME AS NEEDED FOR PAIN   metoprolol  succinate (TOPROL  XL) 25 MG 24 hr tablet Take 1 tablet (25 mg total) by mouth daily.   Multiple Vitamin (MULTIVITAMIN WITH MINERALS) TABS tablet Take 1 tablet by mouth daily.   MYRBETRIQ  25 MG TB24 tablet TAKE 1 TABLET BY MOUTH DAILY   omeprazole  (PRILOSEC) 40 MG capsule Take 1 capsule (  40 mg total) by mouth 2 (two) times daily before a meal.   pramipexole  (MIRAPEX ) 0.25 MG tablet Take 2 tablets (0.5 mg total) by mouth at bedtime.   pregabalin  (LYRICA ) 150 MG capsule Take 1 capsule (150 mg total) by mouth 2 (two) times daily.   RESTASIS  0.05 % ophthalmic emulsion Place 1 drop into both eyes 2 (two) times daily.    rosuvastatin  (CRESTOR ) 20 MG tablet Take 1 tablet (20 mg total) by mouth daily.   sertraline  (ZOLOFT ) 50 MG tablet TAKE 1 TABLET BY MOUTH DAILY   oxyCODONE  (ROXICODONE ) 15 MG immediate release tablet Take 1-2 tablets (15-30 mg total) by mouth every 4 (four) hours as needed. for pain   Bempedoic  Acid (NEXLETOL ) 180 MG TABS Take 1 tablet (180 mg total) by mouth daily in the afternoon. (Patient not taking: Reported on 01/01/2024)   No facility-administered medications prior to visit.   Review of Systems  Constitutional:  Negative for appetite change, chills, fatigue and fever.  Respiratory:  Negative for chest tightness and shortness of breath.   Cardiovascular:  Negative for chest pain and palpitations.  Gastrointestinal:  Negative for abdominal pain, nausea and vomiting.  Neurological:  Negative for dizziness and weakness.       Objective    BP 133/79 (BP Location: Left Arm, Patient Position: Sitting, Cuff Size: Normal)   Pulse 83   Resp 18   Ht 5' 8 (1.727 m)   Wt 186 lb 11.2 oz (84.7 kg)   SpO2 98%   BMI 28.39 kg/m   Physical Exam   General: Appearance:    Well developed, well nourished female in no acute distress  Eyes:    PERRL, conjunctiva/corneas clear, EOM's intact       Lungs:     Clear to auscultation bilaterally, respirations unlabored  Heart:    Normal heart rate. Regular rhythm. No murmurs, rubs, or gallops.    MS:   All extremities are intact.    Neurologic:   Awake, alert, oriented x 3. No apparent focal neurological defect.          Assessment & Plan     1. Dyslipidemia (Primary) Taking rosuvastatin  inconsistently. Encouraged to take at least 2-3 days a week. Bemidoic acid is likely not going to be approved by insurance.   Will add ezetimibe  (ZETIA ) 10 MG tablet; Take 1 tablet (10 mg total) by mouth daily.  Dispense: 90 tablet; Refill: 2  Future Appointments  Date Time Provider Department Center  03/08/2024  3:10 PM Gerard Frederick, NP [facility] None  04/01/2024  3:00 PM [name], MD [facility]  07/27/2024 10:50 AM [facility] WELLNESS VISIT [facility]    2. Cervical radiculitis   3. Degeneration of intervertebral disc of lumbosacral region, unspecified whether pain present Pain control stable on current medication. refill oxyCODONE   (ROXICODONE ) 15 MG immediate release tablet; Take 1-2 tablets (15-30 mg total) by mouth every 4 (four) hours as needed. for pain  Dispense: 180 tablet; Refill: 0  4. Essential hypertension Well controlled.  Continue current medications.    5. Atrial fibrillation with rapid ventricular response (HCC) On DOAC, rate controlled, followed by cardiology.    Return in about 3 months (around 04/02/2024).     Nancyann Gasper, MD  [facility] [phone] (phone) [phone] (fax)  [facility]

## 2024-01-13 DIAGNOSIS — M79671 Pain in right foot: Secondary | ICD-10-CM | POA: Diagnosis not present

## 2024-01-14 ENCOUNTER — Other Ambulatory Visit: Payer: Self-pay | Admitting: Family Medicine

## 2024-01-19 ENCOUNTER — Other Ambulatory Visit: Payer: Self-pay | Admitting: Family Medicine

## 2024-01-19 DIAGNOSIS — E7800 Pure hypercholesterolemia, unspecified: Secondary | ICD-10-CM

## 2024-01-19 DIAGNOSIS — F324 Major depressive disorder, single episode, in partial remission: Secondary | ICD-10-CM

## 2024-01-25 ENCOUNTER — Other Ambulatory Visit: Payer: Self-pay | Admitting: Family Medicine

## 2024-01-25 DIAGNOSIS — M51379 Other intervertebral disc degeneration, lumbosacral region without mention of lumbar back pain or lower extremity pain: Secondary | ICD-10-CM

## 2024-01-25 NOTE — Telephone Encounter (Unsigned)
 Copied from CRM [id number]. Topic: Clinical - Medication Refill >> Jan 25, 2024 11:16 AM Tiffini S wrote: Medication: oxyCODONE  (ROXICODONE ) 15 MG immediate release tablet  Has the patient contacted their pharmacy? No (Agent: If no, request that the patient contact the pharmacy for the refill. If patient does not wish to contact the pharmacy document the reason why and proceed with request.) (Agent: If yes, when and what did the pharmacy advise?)  This is the patient's preferred pharmacy:  [facility] - [place], [place] - [address] [place] [address] Phone: [phone] Fax: [phone]   Is this the correct pharmacy for this prescription? Yes If no, delete pharmacy and type the correct one.   Has the prescription been filled recently? Yes  Is the patient out of the medication? No, have enough for a few days- going on vacation and do not want to run out of medication   Has the patient been seen for an appointment in the last year OR does the patient have an upcoming appointment? Yes  Can we respond through MyChart? Yes  Agent: Please be advised that Rx refills may take up to 3 business days. We ask that you follow-up with your pharmacy.

## 2024-01-26 ENCOUNTER — Other Ambulatory Visit: Payer: Self-pay | Admitting: Medical Genetics

## 2024-01-26 MED ORDER — OXYCODONE HCL 15 MG PO TABS
15.0000 mg | ORAL_TABLET | ORAL | 0 refills | Status: DC | PRN
Start: 2024-01-26 — End: 2024-02-29

## 2024-01-26 NOTE — Telephone Encounter (Signed)
 Requested medication (s) are due for refill today: yes  Requested medication (s) are on the active medication list: yes  Last refill:  01/01/24 #180  Future visit scheduled: yes  Notes to clinic:  med not delegated to NT to RF   Requested Prescriptions  Pending Prescriptions Disp Refills   oxyCODONE  (ROXICODONE ) 15 MG immediate release tablet 180 tablet 0    Sig: Take 1-2 tablets (15-30 mg total) by mouth every 4 (four) hours as needed. for pain     Not Delegated - Analgesics:  Opioid Agonists Failed - 01/26/2024  2:54 PM      Failed - This refill cannot be delegated      Failed - Urine Drug Screen completed in last 360 days      Passed - Valid encounter within last 3 months    Recent Outpatient Visits           3 weeks ago Dyslipidemia   [facility] [name], MD       Future Appointments             In 1 month Gerard Frederick, NP [facility]

## 2024-02-25 ENCOUNTER — Telehealth: Payer: Self-pay

## 2024-02-25 NOTE — Telephone Encounter (Signed)
 Copied from CRM #[id number]. Topic: Clinical - Prescription Issue >> Feb 25, 2024  2:33 PM Emylou G wrote: Reason for CRM: [id number] pharm w/[employer] had concerns of diazepam  (VALIUM ) 10 MG tablet and oxyCODONE  (ROXICODONE ) 15 MG immediate release tablet .SABRA Taking together has side effects.. is their safer combo option?   [phone] can call and speak to any pharmacist

## 2024-02-29 ENCOUNTER — Other Ambulatory Visit: Payer: Self-pay | Admitting: Family Medicine

## 2024-02-29 DIAGNOSIS — M51379 Other intervertebral disc degeneration, lumbosacral region without mention of lumbar back pain or lower extremity pain: Secondary | ICD-10-CM

## 2024-02-29 NOTE — Telephone Encounter (Unsigned)
 Copied from CRM [id number]. Topic: Clinical - Medication Refill >> Feb 29, 2024  3:07 PM Carla L wrote: Medication: oxyCODONE  (ROXICODONE ) 15 MG immediate release tablet  Has the patient contacted their pharmacy? No Needs to contact office due to type of medication.  This is the patient's preferred pharmacy:  [facility] - [place], [place] - [address] [place] [address] Phone: [phone] Fax: [phone]  Is this the correct pharmacy for this prescription? Yes   Has the prescription been filled recently? No  Is the patient out of the medication? No  Has the patient been seen for an appointment in the last year OR does the patient have an upcoming appointment? Yes  Can we respond through MyChart? No  Agent: Please be advised that Rx refills may take up to 3 business days. We ask that you follow-up with your pharmacy.

## 2024-03-01 NOTE — Telephone Encounter (Signed)
 Requested medication (s) are due for refill today:   Provider to review  Requested medication (s) are on the active medication list:   Yes  Future visit scheduled:   Yes 10/10   Last ordered: 01/26/2024 #180, 0 refills  Non delegated refill    Requested Prescriptions  Pending Prescriptions Disp Refills   oxyCODONE  (ROXICODONE ) 15 MG immediate release tablet 180 tablet 0    Sig: Take 1-2 tablets (15-30 mg total) by mouth every 4 (four) hours as needed. for pain     Not Delegated - Analgesics:  Opioid Agonists Failed - 03/01/2024  3:52 PM      Failed - This refill cannot be delegated      Failed - Urine Drug Screen completed in last 360 days      Passed - Valid encounter within last 3 months    Recent Outpatient Visits           2 months ago Dyslipidemia   [facility] [name], MD       Future Appointments             In 1 week Gerard Frederick, NP [facility]

## 2024-03-02 ENCOUNTER — Telehealth: Payer: Self-pay

## 2024-03-02 DIAGNOSIS — E785 Hyperlipidemia, unspecified: Secondary | ICD-10-CM

## 2024-03-02 MED ORDER — OXYCODONE HCL 15 MG PO TABS: 15.0000 mg | ORAL_TABLET | ORAL | 0 refills | Status: DC | PRN

## 2024-03-02 NOTE — Telephone Encounter (Signed)
 Copied from CRM [id number]. Topic: General - Other >> Mar 01, 2024  5:36 PM Zebedee SAUNDERS wrote: Reason for CRM: Pt called to schedule fasting labs that Dr. Gasper requested for September. Please call [phone] pt to schedule labs. >> Mar 02, 2024  8:31 AM Delon D wrote: I do not see a lab order for this pt so I can't advise her.  I do see on her last labs that Dr. Gasper said he would check at her next visit which was in July.  It doesn't look like labs were done then.

## 2024-03-03 NOTE — Telephone Encounter (Signed)
 Have placed order for fasting lipid panel.

## 2024-03-03 NOTE — Addendum Note (Signed)
 Addended by: [name] on: 03/03/2024 09:11 AM   Modules accepted: Orders

## 2024-03-03 NOTE — Telephone Encounter (Signed)
 LMTCB- ok for E2C2 agent to let patient know labs are ordered. She needs to be fasting. No appointment needed for labs. Our lab hours are Mon-Fri 8 am to 11 am and 1 pm to 4 pm.

## 2024-03-08 ENCOUNTER — Encounter: Payer: Self-pay | Admitting: Cardiology

## 2024-03-08 ENCOUNTER — Ambulatory Visit: Attending: Cardiology | Admitting: Cardiology

## 2024-03-08 VITALS — BP 128/70 | HR 66 | Ht 68.0 in | Wt 192.2 lb

## 2024-03-08 DIAGNOSIS — I10 Essential (primary) hypertension: Secondary | ICD-10-CM | POA: Diagnosis not present

## 2024-03-08 DIAGNOSIS — K219 Gastro-esophageal reflux disease without esophagitis: Secondary | ICD-10-CM

## 2024-03-08 DIAGNOSIS — G894 Chronic pain syndrome: Secondary | ICD-10-CM | POA: Diagnosis not present

## 2024-03-08 DIAGNOSIS — I480 Paroxysmal atrial fibrillation: Secondary | ICD-10-CM | POA: Diagnosis not present

## 2024-03-08 DIAGNOSIS — E663 Overweight: Secondary | ICD-10-CM

## 2024-03-08 DIAGNOSIS — E785 Hyperlipidemia, unspecified: Secondary | ICD-10-CM | POA: Diagnosis not present

## 2024-03-08 MED ORDER — APIXABAN 5 MG PO TABS: 5.0000 mg | ORAL_TABLET | Freq: Two times a day (BID) | ORAL | 3 refills | Status: AC

## 2024-03-08 MED ORDER — METOPROLOL SUCCINATE ER 25 MG PO TB24: 25.0000 mg | ORAL_TABLET | Freq: Every day | ORAL | 0 refills | Status: DC

## 2024-03-08 NOTE — Progress Notes (Unsigned)
 Cardiology Office Note   Date:  03/10/2024  ID:  Courtney Ross, DOB 16-Nov-1955, MRN [id number] PCP: [name], MD  [facility] Providers Cardiologist:  None     History of Present Illness Courtney Ross is a 68 y.o. female with a history of dysphagia, chronic GERD, paroxysmal atrial fibrillation, anxiety, fatty liver disease, migraines, hypercholesterolemia, hypertension, restless leg syndrome, who is here today for follow-up.   The patient presented to [facility] on 05/20/2023 with new onset A-fib with RVR. She was status post colonoscopy and polypectomy and was found to be in A-fib RVR during recovery. She was admitted to the hospital started on IV Dilt with conversion to normal sinus rhythm. She was discharged on low-dose metoprolol . Anticoagulation was not started at the time due to recent procedure, however she was placed on a heart monitor with plan at discussed anticoagulation as outpatient. Heart monitor showed normal sinus rhythm with 1 run of VT lasting 5 beats, 18 runs of SVT, less than 1% A-fib burden. Patient was started on Eliquis  5 mg twice daily.   She was seen in clinic 06/2023 overall was doing fairly well.  She did occasionally take a deep breath with occasional palpitations.  She was placed on ZIO XT monitor to determine A-fib burden and if she needed anticoagulation.  ZIO XT monitor revealed that she had continued episodes of atrial fibrillation with longest lasting 1 hour and 5 minutes with a CHA2DS2-VASc of the release 3 she was started on apixaban  5 mg twice daily for stroke prophylaxis and aspirin  was discontinued.  Metoprolol  was increased to 12.5 mg to 25 mg daily.   She was last seen in clinic 12/01/2023 stating overall with her hypertension was doing well.  She continued to note occasional palpitations.  She stated that she had a family friend that had come off her metoprolol  she tried herself off and had worsening palpitations went back on her  previously scheduled dose.  She was tried on Nexletol  180 mg daily due to her previous statin intolerance and was encouraged to take co-Q10.  She returns to clinic today stating that she has been doing well with cardiac perspective.  She continues to have occasional palpitations but rarely.  States that she has been compliant with her current medication without any bleeding with no blood noted in her urine or stool.  Denies any chest pain, shortness of breath or dyspnea on exertion.  Has noted an uptick in her weight which is concerning.  Denies any hospitalizations or visits to the emergency department.  ROS: 10 point review of systems has been reviewed and considered negative except what is listed in the HPI  Studies Reviewed EKG Interpretation Date/Time:  Tuesday March 08 2024 15:27:43 EDT Ventricular Rate:  66 PR Interval:  138 QRS Duration:  94 QT Interval:  402 QTC Calculation: 421 R Axis:   -10  Text Interpretation: Normal sinus rhythm Normal ECG When compared with ECG of 01-Dec-2023 15:26, No significant change was found Confirmed by Gerard Frederick ([id number]) on 03/08/2024 3:32:30 PM    Event monitor 07/02/2023 Patch Wear Time:  13 days and 23 hours (2024-12-05T23:01:29-0500 to 2024-12-19T23:01:21-0500)   Normal sinus rhythm Patient had a min HR of 58 bpm, max HR of 195 bpm, and avg HR of 86 bpm.   1 run of Ventricular Tachycardia occurred lasting 5 beats with a max rate of 128 bpm (avg 111 bpm).    18 Supraventricular Tachycardia runs occurred, the run with the fastest  interval lasting 18 beats with a max rate of 188 bpm (avg 151 bpm); the run with the fastest interval was also the longest.    Atrial Fibrillation occurred (<1% burden), ranging from 103-195 bpm (avg of 141 bpm), the longest lasting 35 mins 37 secs with an avg rate of 133 bpm.    Isolated SVEs were rare (<1.0%), SVE Couplets were rare (<1.0%), and SVE Triplets were rare (<1.0%).  Isolated VEs were rare (<1.0%), VE  Couplets were rare (<1.0%), and no VE Triplets were present.    Patient triggered event (2) associated with normal sinus rhythm   2D echo 05/20/2023  1. Left ventricular ejection fraction, by estimation, is 60 to 65%. The  left ventricle has normal function. The left ventricle has no regional  wall motion abnormalities. Left ventricular diastolic parameters were  normal.   2. Right ventricular systolic function is normal. The right ventricular  size is normal. Tricuspid regurgitation signal is inadequate for assessing  PA pressure.   3. Left atrial size was mildly dilated.   4. The mitral valve is normal in structure. Mild mitral valve  regurgitation. No evidence of mitral stenosis.   5. The aortic valve is bicuspid. Aortic valve regurgitation is not  visualized. No aortic stenosis is present.   6. The inferior vena cava is normal in size with greater than 50%  respiratory variability, suggesting right atrial pressure of 3 mmHg.  Risk Assessment/Calculations  CHA2DS2-VASc Score = 3   This indicates a 3.2% annual risk of stroke. The patient's score is based upon: CHF History: 0 HTN History: 1 Diabetes History: 0 Stroke History: 0 Vascular Disease History: 0 Age Score: 1 Gender Score: 1        Physical Exam VS:  BP 128/70 (BP Location: Left Arm, Patient Position: Sitting, Cuff Size: Normal)   Pulse 66   Ht 5' 8 (1.727 m)   Wt 192 lb 4 oz (87.2 kg)   SpO2 97%   BMI 29.23 kg/m        Wt Readings from Last 3 Encounters:  03/08/24 192 lb 4 oz (87.2 kg)  01/01/24 186 lb 11.2 oz (84.7 kg)  12/01/23 185 lb 8 oz (84.1 kg)    GEN: Well nourished, well developed in no acute distress NECK: No JVD; No carotid bruits CARDIAC: RRR, no murmurs, rubs, gallops RESPIRATORY:  Clear to auscultation without rales, wheezing or rhonchi  ABDOMEN: Soft, non-tender, non-distended EXTREMITIES:  No edema; No deformity   ASSESSMENT AND PLAN Paroxysmal atrial fibrillation.  She has been  maintaining sinus rhythm with EKG today revealed sinus rhythm with a rate of 66 with no acute ischemic changes noted.  She is continued on apixaban  5 mg twice daily for CHA2DS2-VASc at least 3 for stroke prophylaxis.  She does continue metoprolol  25 mg daily with additional metoprolol  for elevated heart rates.  Primary hypertension with a blood pressure today 128/70.  Blood pressure is continue to remain stable.  She is continued on metoprolol  25 mg daily.  She has also been encouraged to monitor pressures 1 to 2 hours postmedication administration as well.  Dyslipidemia with last LDL of 166.  She is continue with simvastatin 20 mg daily and ezetimibe  10 mg daily.  She has upcoming labs with her PCP.  As she had previously been prescribed bempedoic acid  but it was never approved through her insurance.  Chronic pain syndrome where she is continued on chronic pain medications with ongoing management per PCP.  Chronic GERD continued  on PPI therapy.  Symptoms have been stable as of late.  Overweight with a BMI of 29.32 with concerns of weight gain.  She is requesting weight loss medication today.  With her prior history of obstructive sleep apnea she would likely qualify for Zepbound.  She has been advised that we will actually have to have her sleep study scanned into her medical record.       Dispo: Patient to return to clinic see MD/APP in 6 months or sooner if needed for further evaluation.  Signed, Tippi Mccrae, NP

## 2024-03-08 NOTE — Patient Instructions (Signed)
 Medication Instructions:  Your physician recommends that you continue on your current medications as directed. Please refer to the Current Medication list given to you today.  *If you need a refill on your cardiac medications before your next appointment, please call your pharmacy*  Lab Work: No labs ordered today  If you have labs (blood work) drawn today and your tests are completely normal, you will receive your results only by: MyChart Message (if you have MyChart) OR A paper copy in the mail If you have any lab test that is abnormal or we need to change your treatment, we will call you to review the results.  Testing/Procedures: No test ordered today   Follow-Up: At [facility], you and your health needs are our priority.  As part of our continuing mission to provide you with exceptional heart care, our providers are all part of one team.  This team includes your primary Cardiologist (physician) and Advanced Practice Providers or APPs (Physician Assistants and Nurse Practitioners) who all work together to provide you with the care you need, when you need it.  Your next appointment:   6 month(s)  Provider:   You may see  one of the following Advanced Practice Providers on your designated Care Team:   Laneta Pintos, NP Gildardo Labrador, PA-C Varney Gentleman, PA-C Cadence Kingston, PA-C Ronald Cockayne, NP Morey Ar, NP

## 2024-03-14 ENCOUNTER — Other Ambulatory Visit (HOSPITAL_COMMUNITY)
Admission: RE | Admit: 2024-03-14 | Discharge: 2024-03-14 | Disposition: A | Discharge status: Home / Self Care | Payer: Self-pay | Source: Ambulatory Visit | Attending: Oncology | Admitting: Oncology

## 2024-03-19 LAB — HEPATIC FUNCTION PANEL
ALT: 21 IU/L (ref 0–32)
AST: 21 IU/L (ref 0–40)
Albumin: 4.3 g/dL (ref 3.9–4.9)
Alkaline Phosphatase: 110 IU/L (ref 49–135)
Bilirubin Total: 0.4 mg/dL (ref 0.0–1.2)
Bilirubin, Direct: 0.11 mg/dL (ref 0.00–0.40)
Total Protein: 6.7 g/dL (ref 6.0–8.5)

## 2024-03-19 LAB — LIPID PANEL
Chol/HDL Ratio: 3.4 ratio (ref 0.0–4.4)
Cholesterol, Total: 226 mg/dL — ABNORMAL HIGH (ref 100–199)
HDL: 67 mg/dL (ref >39)
LDL Chol Calc (NIH): 137 mg/dL — ABNORMAL HIGH (ref 0–99)
Triglycerides: 126 mg/dL (ref 0–149)
VLDL Cholesterol Cal: 22 mg/dL (ref 5–40)

## 2024-03-20 ENCOUNTER — Ambulatory Visit: Payer: Self-pay | Admitting: Family Medicine

## 2024-03-21 ENCOUNTER — Other Ambulatory Visit: Payer: Self-pay | Admitting: Family Medicine

## 2024-03-22 ENCOUNTER — Other Ambulatory Visit: Payer: Self-pay | Admitting: Family Medicine

## 2024-03-22 DIAGNOSIS — M51379 Other intervertebral disc degeneration, lumbosacral region without mention of lumbar back pain or lower extremity pain: Secondary | ICD-10-CM

## 2024-03-22 LAB — GENECONNECT MOLECULAR SCREEN: Genetic Analysis Overall Interpretation: NEGATIVE

## 2024-03-28 ENCOUNTER — Other Ambulatory Visit: Payer: Self-pay | Admitting: Family Medicine

## 2024-03-28 DIAGNOSIS — M51379 Other intervertebral disc degeneration, lumbosacral region without mention of lumbar back pain or lower extremity pain: Secondary | ICD-10-CM

## 2024-03-28 NOTE — Telephone Encounter (Unsigned)
 Copied from CRM [id number]. Topic: Clinical - Medication Refill >> Mar 28, 2024 12:11 PM Yolanda T wrote: Medication: oxyCODONE  (ROXICODONE ) 15 MG immediate release tablet  Has the patient contacted their pharmacy? No  This is the patient's preferred pharmacy:  [facility] - [place], [place] - [address] [place] [address] Phone: [phone] Fax: [phone]  Is this the correct pharmacy for this prescription? Yes  Has the prescription been filled recently? Yes  Is the patient out of the medication? Yes  Has the patient been seen for an appointment in the last year OR does the patient have an upcoming appointment? Yes  Can we respond through MyChart? Yes  Agent: Please be advised that Rx refills may take up to 3 business days. We ask that you follow-up with your pharmacy.

## 2024-03-29 NOTE — Telephone Encounter (Signed)
 Requested medications are due for refill today.  yes  Requested medications are on the active medications list.  yes  Last refill. 03/02/2024 #180 0 rf  Future visit scheduled.   yes  Notes to clinic.  Refill not delegated.    Requested Prescriptions  Pending Prescriptions Disp Refills   oxyCODONE  (ROXICODONE ) 15 MG immediate release tablet 180 tablet 0    Sig: Take 1-2 tablets (15-30 mg total) by mouth every 4 (four) hours as needed. for pain     Not Delegated - Analgesics:  Opioid Agonists Failed - 03/29/2024  5:10 PM      Failed - This refill cannot be delegated      Failed - Urine Drug Screen completed in last 360 days      Passed - Valid encounter within last 3 months    Recent Outpatient Visits           2 months ago Dyslipidemia   [facility] Courtney Nancyann BRAVO, MD

## 2024-03-30 ENCOUNTER — Other Ambulatory Visit (HOSPITAL_COMMUNITY): Payer: Self-pay

## 2024-03-30 MED ORDER — OXYCODONE HCL 15 MG PO TABS: 15.0000 mg | ORAL_TABLET | ORAL | 0 refills | Status: DC | PRN

## 2024-04-01 ENCOUNTER — Ambulatory Visit: Admitting: Family Medicine

## 2024-04-01 ENCOUNTER — Other Ambulatory Visit: Payer: Self-pay

## 2024-04-05 ENCOUNTER — Other Ambulatory Visit (HOSPITAL_COMMUNITY): Payer: Self-pay

## 2024-04-08 ENCOUNTER — Ambulatory Visit (INDEPENDENT_AMBULATORY_CARE_PROVIDER_SITE_OTHER): Admitting: Family Medicine

## 2024-04-08 ENCOUNTER — Encounter: Payer: Self-pay | Admitting: Family Medicine

## 2024-04-08 VITALS — BP 120/70 | HR 62 | Resp 16 | Ht 68.0 in | Wt 193.6 lb

## 2024-04-08 DIAGNOSIS — G894 Chronic pain syndrome: Secondary | ICD-10-CM

## 2024-04-08 DIAGNOSIS — G709 Myoneural disorder, unspecified: Secondary | ICD-10-CM | POA: Diagnosis not present

## 2024-04-08 DIAGNOSIS — I4891 Unspecified atrial fibrillation: Secondary | ICD-10-CM | POA: Diagnosis not present

## 2024-04-08 DIAGNOSIS — Z23 Encounter for immunization: Secondary | ICD-10-CM | POA: Diagnosis not present

## 2024-04-08 DIAGNOSIS — F419 Anxiety disorder, unspecified: Secondary | ICD-10-CM

## 2024-04-08 DIAGNOSIS — G2581 Restless legs syndrome: Secondary | ICD-10-CM | POA: Diagnosis not present

## 2024-04-08 DIAGNOSIS — M51379 Other intervertebral disc degeneration, lumbosacral region without mention of lumbar back pain or lower extremity pain: Secondary | ICD-10-CM

## 2024-04-08 DIAGNOSIS — E785 Hyperlipidemia, unspecified: Secondary | ICD-10-CM | POA: Diagnosis not present

## 2024-04-08 DIAGNOSIS — M47814 Spondylosis without myelopathy or radiculopathy, thoracic region: Secondary | ICD-10-CM | POA: Diagnosis not present

## 2024-04-08 MED ORDER — CYCLOBENZAPRINE HCL 10 MG PO TABS: 10.0000 mg | ORAL_TABLET | Freq: Three times a day (TID) | ORAL | 1 refills | Status: DC | PRN

## 2024-04-08 MED ORDER — SERTRALINE HCL 50 MG PO TABS
75.0000 mg | ORAL_TABLET | Freq: Every day | ORAL | Status: DC
Start: 2024-04-08 — End: 2024-04-30

## 2024-04-08 NOTE — Progress Notes (Signed)
 Established patient visit   Patient: Courtney Ross   DOB: 03-11-1956   68 y.o. Female  MRN: [id number] Visit Date: 04/08/2024  Today's healthcare provider: Nancyann Perry, MD   Chief Complaint  Patient presents with   Medical Management of Chronic Issues   Subjective    Discussed the use of AI scribe software for clinical note transcription with the patient, who gave verbal consent to proceed.  History of Present Illness   Courtney Ross is a 68 year old female who presents for follow-up on her cholesterol management.  Her cholesterol levels have improved since December, decreasing from 261 to 226, with the use of rosuvastatin  and Zetia . She is also taking CoQ10 and has not experienced any side effects from these medications. Previously, she was not taking her medications regularly, but she is now more consistent with her regimen.   She continues on scheduled dose of meloxicam  and oxycodone  which has kept her back pain manage without escalating dose for many years.   She is concerned about weight gain and mentions a friend's success with a weight loss injection. She is aware of the high cost of these medications and is considering her options. She cannot take phentermine  due to her atrial fibrillation.  She is currently taking Mirapex  for restless leg syndrome and has increased her dose from two to three pills due to decreased effectiveness. She is also trying an over-the-counter supplement for cramping, though she cannot recall its name.  She is on Wellbutrin  three times a day and Zoloft , which she feels helps her anxiety and depression. She is considering increasing her Zoloft  dose due to recent life stressors and aging, which she feels are affecting her mood.  She requests a refill for her generic Flexeril  prescription.     Lab Results  Component Value Date   CHOL 226 (H) 03/18/2024   HDL 67 03/18/2024   LDLCALC 137 (H) 03/18/2024   LDLDIRECT 168 (H)  06/08/2023   TRIG 126 03/18/2024   CHOLHDL 3.4 03/18/2024     Medications: Outpatient Medications Prior to Visit  Medication Sig   apixaban  (ELIQUIS ) 5 MG TABS tablet Take 1 tablet (5 mg total) by mouth 2 (two) times daily.   buPROPion  (WELLBUTRIN  SR) 150 MG 12 hr tablet TAKE 1 TABLET BY MOUTH 3 TIMES  DAILY   Cholecalciferol  (VITAMIN D3) 5000 UNITS CAPS Take 5,000 Units by mouth daily.    Coenzyme Q10 200 MG capsule Take 200 mg by mouth daily.   diazepam  (VALIUM ) 10 MG tablet TAKE ONE TABLET BY MOUTH EVERY 6 HOURS AS NEEDED FOR ANXIETY   diclofenac  Sodium (VOLTAREN ) 1 % GEL SMARTSIG:1 Gram(s) Topical 4 Times Daily PRN   estrogens , conjugated, (PREMARIN ) 0.625 MG tablet Take 1 tablet (0.625 mg total) by mouth daily. Take daily for 21 days then do not take for 7 days.   ezetimibe  (ZETIA ) 10 MG tablet Take 1 tablet (10 mg total) by mouth daily.   fluticasone  (FLONASE ) 50 MCG/ACT nasal spray Place 1 spray into both nostrils daily as needed for allergies or rhinitis.   lidocaine  (LIDODERM ) 5 % APPLY 1 PATCH TOPICALLY TO SKIN  DAILY MAY BE LEFT ON UP TO 12  HOURS WITHIN A 24 HOUR PERIOD AS DIRECTED BY MD   linaclotide  (LINZESS ) 145 MCG CAPS capsule Take 1 capsule (145 mcg total) by mouth daily.   meloxicam  (MOBIC ) 15 MG tablet TAKE 1 TABLET BY MOUTH AT  BEDTIME AS NEEDED FOR PAIN  metoprolol  succinate (TOPROL  XL) 25 MG 24 hr tablet Take 1 tablet (25 mg total) by mouth daily.   Multiple Vitamin (MULTIVITAMIN WITH MINERALS) TABS tablet Take 1 tablet by mouth daily.   MYRBETRIQ  25 MG TB24 tablet TAKE 1 TABLET BY MOUTH DAILY   omeprazole  (PRILOSEC) 40 MG capsule Take 1 capsule (40 mg total) by mouth 2 (two) times daily before a meal.   oxyCODONE  (ROXICODONE ) 15 MG immediate release tablet Take 1-2 tablets (15-30 mg total) by mouth every 4 (four) hours as needed. for pain   pramipexole  (MIRAPEX ) 0.25 MG tablet Take 2 tablets (0.5 mg total) by mouth at bedtime. (Patient taking differently: Take 0.75  mg by mouth at bedtime.)   pregabalin  (LYRICA ) 150 MG capsule TAKE 1 CAPSULE BY MOUTH TWICE  DAILY   RESTASIS  0.05 % ophthalmic emulsion Place 1 drop into both eyes 2 (two) times daily.    rosuvastatin  (CRESTOR ) 20 MG tablet TAKE 1 TABLET BY MOUTH DAILY   [DISCONTINUED] cyclobenzaprine  (FLEXERIL ) 10 MG tablet Take 1 tablet (10 mg total) by mouth 3 (three) times daily as needed for muscle spasms.   sertraline  (ZOLOFT ) 50 MG tablet TAKE 1 TABLET BY MOUTH DAILY   No facility-administered medications prior to visit.   Review of Systems     Objective    BP 120/70 (BP Location: Left Arm, Patient Position: Sitting, Cuff Size: Normal)   Pulse 62   Resp 16   Ht 5' 8 (1.727 m)   Wt 193 lb 9.6 oz (87.8 kg)   SpO2 97%   BMI 29.44 kg/m   Physical Exam   General appearance: Well developed, well nourished female, cooperative and in no acute distress Head: Normocephalic, without obvious abnormality, atraumatic Respiratory: Respirations even and unlabored, normal respiratory rate Extremities: All extremities are intact.  Skin: Skin color, texture, turgor normal. No rashes seen  Psych: Appropriate mood and affect. Neurologic: Mental status: Alert, oriented to person, place, and time, thought content appropriate.   Assessment & Plan     1. Spondylosis of thoracic region without myelopathy or radiculopathy (Primary) Pain adequately controlled on current medication regiment with no signs of abuse or diversion.   - Pain Mgt Scrn (14 Drugs), Ur  2. Chronic pain associated with significant psychosocial dysfunction  - Pain Mgt Scrn (14 Drugs), Ur  3. Anxiety Has resonded well to bupropion  and sertraline , but has been having more stress lately and would like to increase dose. Will oncrease from 50mg  to sertraline  (ZOLOFT ) 50 MG tablet; Take 1.5 tablets (75 mg total) by mouth daily.and continue same dose of bupropion .   4. Myoneural disorder, unspecified (HCC) Pain management remains  effective. Refill - cyclobenzaprine  (FLEXERIL ) 10 MG tablet; Take 1 tablet (10 mg total) by mouth 3 (three) times daily as needed for muscle spasms.  Dispense: 270 tablet; Refill: 1  5. Restless legs syndrome Doing well with 3 mirapex  hs.   6. Immunization due  - Flu vaccine HIGH DOSE PF(Fluzone  Trivalent)  7. Degeneration of intervertebral disc of lumbosacral region, unspecified whether pain present Stable, continues follow up with neurosurgery.   8. Atrial fibrillation with rapid ventricular response (HCC) On DOAC, rate well controlled. Continue regular cardiology follow up.   9. Dyslipidemia Now  taking statin and ezetimibe  consistently with much better lipid profile.   Future Appointments  Date Time Provider Department Center  07/27/2024 10:50 AM [facility]-ANNUAL WELLNESS VISIT [facility]  10/07/2024  8:40 AM Gasper, Nancyann BRAVO, MD [facility]  Nancyann Perry, MD  [facility] [phone] (phone) [phone] (fax)  [facility]

## 2024-04-08 NOTE — Patient Instructions (Signed)
 Courtney Ross  Please review the attached list of medications and notify my office if there are any errors.   . Please bring all of your medications to every appointment so we can make sure that our medication list is the same as yours.

## 2024-04-09 LAB — PAIN MGT SCRN (14 DRUGS), UR
Amphetamine Scrn, Ur: NEGATIVE ng/mL
BARBITURATE SCREEN URINE: NEGATIVE ng/mL
BENZODIAZEPINE SCREEN, URINE: POSITIVE ng/mL — AB
Buprenorphine, Urine: NEGATIVE ng/mL
CANNABINOIDS UR QL SCN: NEGATIVE ng/mL
Cocaine (Metab) Scrn, Ur: NEGATIVE ng/mL
Creatinine(Crt), U: 188.3 mg/dL (ref 20.0–300.0)
Fentanyl, Urine: NEGATIVE pg/mL
Meperidine Screen, Urine: NEGATIVE ng/mL
Methadone Screen, Urine: NEGATIVE ng/mL
OXYCODONE+OXYMORPHONE UR QL SCN: POSITIVE ng/mL — AB
Opiate Scrn, Ur: POSITIVE ng/mL — AB
Ph of Urine: 5.2 (ref 4.5–8.9)
Phencyclidine Qn, Ur: NEGATIVE ng/mL
Propoxyphene Scrn, Ur: NEGATIVE ng/mL
Tramadol Screen, Urine: NEGATIVE ng/mL

## 2024-04-21 ENCOUNTER — Telehealth: Payer: Self-pay | Admitting: Cardiology

## 2024-04-21 DIAGNOSIS — Z79899 Other long term (current) drug therapy: Secondary | ICD-10-CM

## 2024-04-21 NOTE — Telephone Encounter (Signed)
  Patient c/o Palpitations:  STAT if patient reporting lightheadedness, shortness of breath, or chest pain  How long have you had palpitations/irregular HR/ Afib? Are you having the symptoms now? Episode happened 10/28  Are you currently experiencing lightheadedness, SOB or CP? Lightheaded, blurred vision, chest pain   Do you have a history of afib (atrial fibrillation) or irregular heart rhythm? Yes   Have you checked your BP or HR? (document readings if available): 192   Are you experiencing any other symptoms? No    Pt had an episode 10/28 with intense dizziness, tunnel vision, and lightheadedness. Pt states her heart was racing and apple watch read 192HR. Issue has resolved and she is no longer having symptoms but would still like a c/b. Please advise.

## 2024-04-21 NOTE — Telephone Encounter (Signed)
 Returned pt's call regarding her having 3 episodes of SVT with a HR at 192 on 10/28.  She states that she was sitting on the coach and she felt like she was going to pass out and her vision was blurred. She stated that each episode lasted 15 - 20 seconds.  She also states that she experienced a headache and her blood pressure was high, but didn't write down the numbers.  She said at that time she took Ibuprofen  for the HA and an extra Metoprolol  Succinate 25 mg as she was instructed to do if she had SVT.   She states she is wanting to let Tylene Lunch, NP know and if there is any further recommendations if this should happen again.  I also advised her that should she experience a high HR and it remains high for an extended period of time or if her episodes become more frequent, she should call 911 or go to the ED.  Pt verbalized understanding.sustain

## 2024-04-22 ENCOUNTER — Telehealth: Payer: Self-pay | Admitting: Cardiology

## 2024-04-22 NOTE — Telephone Encounter (Signed)
-----   Message from Nurse Jon R sent at 04/22/2024  9:12 AM EDT ----- Regarding: follow up Per Tylene, patient needs to be seen before 6 months

## 2024-04-22 NOTE — Telephone Encounter (Signed)
 Patient needs labs & LVM to schedule fu appt.

## 2024-04-27 ENCOUNTER — Other Ambulatory Visit: Payer: Self-pay | Admitting: Family Medicine

## 2024-04-27 DIAGNOSIS — M51379 Other intervertebral disc degeneration, lumbosacral region without mention of lumbar back pain or lower extremity pain: Secondary | ICD-10-CM

## 2024-04-27 NOTE — Telephone Encounter (Signed)
 Copied from CRM [id number]. Topic: Clinical - Medication Refill >> Apr 27, 2024  9:16 AM Larissa S wrote: Medication: oxyCODONE  (ROXICODONE ) 15 MG immediate release tablet  Has the patient contacted their pharmacy? Yes (Agent: If no, request that the patient contact the pharmacy for the refill. If patient does not wish to contact the pharmacy document the reason why and proceed with request.) (Agent: If yes, when and what did the pharmacy advise?)  This is the patient's preferred pharmacy:  [facility] - [place], [place] - [address] [place] [address] Phone: [phone] Fax: [phone]    Is this the correct pharmacy for this prescription? Yes If no, delete pharmacy and type the correct one.   Has the prescription been filled recently? No  Is the patient out of the medication? Yes  Has the patient been seen for an appointment in the last year OR does the patient have an upcoming appointment? Yes  Can we respond through MyChart? No  Agent: Please be advised that Rx refills may take up to 3 business days. We ask that you follow-up with your pharmacy.

## 2024-04-28 NOTE — Telephone Encounter (Signed)
 Requested medication (s) are due for refill today: yes  Requested medication (s) are on the active medication list: yes  Last refill:  04/09/24  Future visit scheduled: yes  Notes to clinic:  Unable to refill per protocol, cannot delegate.      Requested Prescriptions  Pending Prescriptions Disp Refills   oxyCODONE  (ROXICODONE ) 15 MG immediate release tablet 180 tablet 0    Sig: Take 1-2 tablets (15-30 mg total) by mouth every 4 (four) hours as needed. for pain     Not Delegated - Analgesics:  Opioid Agonists Failed - 04/28/2024  3:44 PM      Failed - This refill cannot be delegated      Failed - Urine Drug Screen completed in last 360 days      Passed - Valid encounter within last 3 months    Recent Outpatient Visits           2 weeks ago Spondylosis of thoracic region without myelopathy or radiculopathy   [facility] [name], MD   3 months ago Dyslipidemia   [facility] [name], MD       Future Appointments             In 2 weeks Gerard Frederick, NP [facility]

## 2024-04-30 ENCOUNTER — Other Ambulatory Visit: Payer: Self-pay | Admitting: Family Medicine

## 2024-04-30 DIAGNOSIS — F419 Anxiety disorder, unspecified: Secondary | ICD-10-CM

## 2024-04-30 MED ORDER — OXYCODONE HCL 15 MG PO TABS: 15.0000 mg | ORAL_TABLET | ORAL | 0 refills | Status: DC | PRN

## 2024-05-02 ENCOUNTER — Other Ambulatory Visit: Payer: Self-pay | Admitting: Family Medicine

## 2024-05-02 DIAGNOSIS — G2581 Restless legs syndrome: Secondary | ICD-10-CM

## 2024-05-10 ENCOUNTER — Other Ambulatory Visit
Admission: RE | Admit: 2024-05-10 | Discharge: 2024-05-10 | Disposition: A | Discharge status: Home / Self Care | Attending: Cardiology | Admitting: Cardiology

## 2024-05-10 DIAGNOSIS — Z79899 Other long term (current) drug therapy: Secondary | ICD-10-CM | POA: Insufficient documentation

## 2024-05-10 LAB — CBC
HCT: 41.2 % (ref 36.0–46.0)
Hemoglobin: 13.4 g/dL (ref 12.0–15.0)
MCH: 25.5 pg — ABNORMAL LOW (ref 26.0–34.0)
MCHC: 32.5 g/dL (ref 30.0–36.0)
MCV: 78.5 fL — ABNORMAL LOW (ref 80.0–100.0)
Platelets: 267 K/uL (ref 150–400)
RBC: 5.25 MIL/uL — ABNORMAL HIGH (ref 3.87–5.11)
RDW: 13.2 % (ref 11.5–15.5)
WBC: 6.1 K/uL (ref 4.0–10.5)
nRBC: 0 % (ref 0.0–0.2)

## 2024-05-10 LAB — BASIC METABOLIC PANEL WITH GFR
Anion gap: 11 (ref 5–15)
BUN: 14 mg/dL (ref 8–23)
CO2: 25 mmol/L (ref 22–32)
Calcium: 9.2 mg/dL (ref 8.9–10.3)
Chloride: 103 mmol/L (ref 98–111)
Creatinine, Ser: 1 mg/dL (ref 0.44–1.00)
GFR, Estimated: >60 mL/min (ref >60)
Glucose, Bld: 79 mg/dL (ref 70–99)
Potassium: 4.2 mmol/L (ref 3.5–5.1)
Sodium: 139 mmol/L (ref 135–145)

## 2024-05-10 LAB — MAGNESIUM: Magnesium: 2 mg/dL (ref 1.7–2.4)

## 2024-05-10 LAB — TSH: TSH: 2.23 u[IU]/mL (ref 0.350–4.500)

## 2024-05-11 ENCOUNTER — Ambulatory Visit: Payer: Self-pay | Admitting: Cardiology

## 2024-05-11 NOTE — Progress Notes (Signed)
 Labs have remained stable.  Continue medication regimen with no changes needed at this time.

## 2024-05-16 ENCOUNTER — Ambulatory Visit: Attending: Cardiology | Admitting: Cardiology

## 2024-05-16 ENCOUNTER — Encounter: Payer: Self-pay | Admitting: Cardiology

## 2024-05-16 VITALS — BP 136/76 | HR 69 | Ht 68.0 in | Wt 192.8 lb

## 2024-05-16 DIAGNOSIS — I480 Paroxysmal atrial fibrillation: Secondary | ICD-10-CM | POA: Diagnosis not present

## 2024-05-16 DIAGNOSIS — E785 Hyperlipidemia, unspecified: Secondary | ICD-10-CM

## 2024-05-16 DIAGNOSIS — K219 Gastro-esophageal reflux disease without esophagitis: Secondary | ICD-10-CM | POA: Diagnosis not present

## 2024-05-16 DIAGNOSIS — I10 Essential (primary) hypertension: Secondary | ICD-10-CM | POA: Diagnosis not present

## 2024-05-16 DIAGNOSIS — I471 Supraventricular tachycardia, unspecified: Secondary | ICD-10-CM

## 2024-05-16 DIAGNOSIS — G894 Chronic pain syndrome: Secondary | ICD-10-CM

## 2024-05-16 NOTE — Patient Instructions (Signed)
 Medication Instructions:  Your physician recommends that you continue on your current medications as directed. Please refer to the Current Medication list given to you today.   *If you need a refill on your cardiac medications before your next appointment, please call your pharmacy*  Lab Work: No labs ordered today  If you have labs (blood work) drawn today and your tests are completely normal, you will receive your results only by: MyChart Message (if you have MyChart) OR A paper copy in the mail If you have any lab test that is abnormal or we need to change your treatment, we will call you to review the results.  Testing/Procedures: No test ordered today   Follow-Up: At [facility], you and your health needs are our priority.  As part of our continuing mission to provide you with exceptional heart care, our providers are all part of one team.  This team includes your primary Cardiologist (physician) and Advanced Practice Providers or APPs (Physician Assistants and Nurse Practitioners) who all work together to provide you with the care you need, when you need it.  Your next appointment:   6 month(s)  Provider:   Ronald Cockayne, NP

## 2024-05-16 NOTE — Progress Notes (Signed)
 Cardiology Office Note   Date:  05/16/2024  ID:  Courtney Ross, DOB Sep 10, 1955, MRN [id number] PCP: Courtney Nancyann BRAVO, MD  Courtney Ross Cardiologist:  None Cardiology APP:  Gerard Frederick, NP     History of Present Illness Courtney Ross is a 68 y.o. female with past medical history of dysphagia, chronic GERD, paroxysmal atrial fibrillation, anxiety, fatty liver disease, migraines, hypercholesterolemia, hypertension, restless leg syndrome, who presents today for follow-up.   The patient presented to [facility] on 05/20/2023 with new onset A-fib with RVR. She was status post colonoscopy and polypectomy and was found to be in A-fib RVR during recovery. She was admitted to the hospital started on IV Dilt with conversion to normal sinus rhythm. She was discharged on low-dose metoprolol . Anticoagulation was not started at the time due to recent procedure, however she was placed on a heart monitor with plan at discussed anticoagulation as outpatient. Heart monitor showed normal sinus rhythm with 1 run of VT lasting 5 beats, 18 runs of SVT, less than 1% A-fib burden. Patient was started on Eliquis  5 mg twice daily.   She was seen in clinic 06/2023 overall was doing fairly well.  She did occasionally take a deep breath with occasional palpitations.  She was placed on ZIO XT monitor to determine A-fib burden and if she needed anticoagulation.  ZIO XT monitor revealed that she had continued episodes of atrial fibrillation with longest lasting 1 hour and 5 minutes with a CHA2DS2-VASc of the release 3 she was started on apixaban  5 mg twice daily for stroke prophylaxis and aspirin  was discontinued.  Metoprolol  was increased to 12.5 mg to 25 mg daily.  He previously was seen in clinic 12/01/2023 stating overall her hypertension was doing well.  She continued to have occasional palpitations.  She stated had a family friend that had come up with her metoprolol  so she tried to wean herself off  and had worsening palpitations and went back on her previously scheduled dose.  She was tried on Nexletol  180 mg daily due to previous statin intolerance and was encouraged to take co-Q10.  She was last seen in clinic 03/10/2024 states she was doing well from the cardiac perspective.  She continued to have occasional palpitations but rarely.  States she had been compliant with her medication regimen.  She denied any bleeding.  She was continued on apixaban  5 mg twice daily for CHA2DS2-VASc of at least 3 for stroke prophylaxis.  There were no other medication changes that were made or further testing that was ordered at that time.   She returns to clinic today after recently calling in to the triage line stating she had 3 episodes of SVT with a heart rate of 192 on 10/28.  She states she was sitting on the couch and she fell like she was going to pass out and her vision was blurred.  She states that each episode lasted 15 to 20 seconds.  She also states that she experienced a headache and her blood pressure was high.  States that she had taken ibuprofen  for headache and took an additional metoprolol  succinate 25 mg as instructed if she had SVT.  She states since last night she has not had any further instances of the elevated heart rate nor has she required an increased amount of her metoprolol .  She denies any chest pain, shortness of breath dyspnea exertion.  States that she has been compliant with her current medication regimen without any undue side  effects.  Denies any hospitalizations or visits to the emergency department.  Denies any bleeding with no blood noted in her urine or stool.  Labs that she was previously scheduled after the event all remained stable with no changes needed to her medications.  ROS: 10 point review of systems has been reviewed and considered negative except ones been listed in HPI  Studies Reviewed EKG Interpretation Date/Time:  Monday May 16 2024 15:16:31  EST Ventricular Rate:  69 PR Interval:  136 QRS Duration:  96 QT Interval:  398 QTC Calculation: 426 R Axis:   -5  Text Interpretation: Normal sinus rhythm Normal ECG When compared with ECG of 08-Mar-2024 15:27, No significant change was found Confirmed by Gerard Ross ([id number]) on 05/16/2024 3:20:27 PM    Event monitor 07/02/2023 Patch Wear Time:  13 days and 23 hours (2024-12-05T23:01:29-0500 to 2024-12-19T23:01:21-0500)   Normal sinus rhythm Patient had a min HR of 58 bpm, max HR of 195 bpm, and avg HR of 86 bpm.   1 run of Ventricular Tachycardia occurred lasting 5 beats with a max rate of 128 bpm (avg 111 bpm).    18 Supraventricular Tachycardia runs occurred, the run with the fastest interval lasting 18 beats with a max rate of 188 bpm (avg 151 bpm); the run with the fastest interval was also the longest.    Atrial Fibrillation occurred (<1% burden), ranging from 103-195 bpm (avg of 141 bpm), the longest lasting 35 mins 37 secs with an avg rate of 133 bpm.    Isolated SVEs were rare (<1.0%), SVE Couplets were rare (<1.0%), and SVE Triplets were rare (<1.0%).  Isolated VEs were rare (<1.0%), VE Couplets were rare (<1.0%), and no VE Triplets were present.    Patient triggered event (2) associated with normal sinus rhythm   2D echo 05/20/2023  1. Left ventricular ejection fraction, by estimation, is 60 to 65%. The  left ventricle has normal function. The left ventricle has no regional  wall motion abnormalities. Left ventricular diastolic parameters were  normal.   2. Right ventricular systolic function is normal. The right ventricular  size is normal. Tricuspid regurgitation signal is inadequate for assessing  PA pressure.   3. Left atrial size was mildly dilated.   4. The mitral valve is normal in structure. Mild mitral valve  regurgitation. No evidence of mitral stenosis.   5. The aortic valve is bicuspid. Aortic valve regurgitation is not  visualized. No aortic stenosis is  present.   6. The inferior vena cava is normal in size with greater than 50%  respiratory variability, suggesting right atrial pressure of 3 mmHg.   Risk Assessment/Calculations The 10-year ASCVD risk score (Arnett DK, et al., 2019) is: 11.4%   Values used to calculate the score:     Age: 86 years     Clincally relevant sex: Female     Is Non-Hispanic African American: No     Diabetic: No     Tobacco smoker: No     Systolic Blood Pressure: 136 mmHg     Is BP treated: Yes     HDL Cholesterol: 67 mg/dL     Total Cholesterol: 226 mg/dL  [id number] Score = 3   This indicates a 3.2% annual risk of stroke. The patient's score is based upon: CHF History: 0 HTN History: 1 Diabetes History: 0 Stroke History: 0 Vascular Disease History: 0 Age Score: 1 Gender Score: 1            Physical Exam  VS:  BP 136/76   Pulse 69   Ht 5' 8 (1.727 m)   Wt 192 lb 12.8 oz (87.5 kg)   SpO2 97%   BMI 29.32 kg/m        Wt Readings from Last 3 Encounters:  05/16/24 192 lb 12.8 oz (87.5 kg)  04/08/24 193 lb 9.6 oz (87.8 kg)  03/08/24 192 lb 4 oz (87.2 kg)    GEN: Well nourished, well developed in no acute distress NECK: No JVD; No carotid bruits CARDIAC: RRR, no murmurs, rubs, gallops RESPIRATORY:  Clear to auscultation without rales, wheezing or rhonchi  ABDOMEN: Soft, non-tender, non-distended EXTREMITIES:  No edema; No deformity   ASSESSMENT AND PLAN Paroxysmal atrial fibrillation with EKG today revealing sinus rhythm with a rate of 69 with no acute ischemic changes noted.  She is continued on apixaban  5 mg twice daily for CHA2DS2-VASc score of at least 3 for stroke prophylaxis.  She is also continued on metoprolol  25 mg daily with an additional dose as needed for elevated heart rates.  She has tolerated blood thinning medication well with no instances or episodes of bleeding.  pSVT previously noted on ZIO monitor January 2025.  At that time she was started on low-dose of beta-blocker  therapy.  She has only required the use of an additional dose once and that was when she crawled into the triage line.  She has been encouraged to continue to take additional doses of metoprolol  as she did if needed.  If she has any further instances or episodes we can repeat her ZIO monitoring at that time.  Since her last episode she has had no additional.  Primary hypertension with a blood pressure today of 136/76.  Blood pressures remain stable.  She is continued on metoprolol  succinate 25 mg daily.  She has been encouraged to continue to monitor pressures 1 to 2 hours postmedication administration as well.  Dyslipidemia with last LDL of 137.  She is continued on simvastatin 20 mg daily and ezetimibe  10 mg daily.  Would benefit from ongoing reduction in her LDL as her ASCVD risk is 11.4%.  She had previously been prescribed bempedoic acid  but was never approved through the insurance.  Will discuss further escalation of statin's or return with potential changing of simvastatin to rosuvastatin .  Chronic pain syndrome where she is continued on chronic pain medications with ongoing management per PCP.  Stable GERD continued on PPI therapy.  Symptoms have been well-controlled as of late.       Dispo: Patient to return to clinic to see MD/APP in 6 months or sooner if needed for further evaluation  Signed, Lauraine Crespo, NP

## 2024-05-26 ENCOUNTER — Other Ambulatory Visit: Payer: Self-pay | Admitting: Family Medicine

## 2024-05-26 DIAGNOSIS — E785 Hyperlipidemia, unspecified: Secondary | ICD-10-CM

## 2024-05-27 ENCOUNTER — Other Ambulatory Visit: Payer: Self-pay | Admitting: Family Medicine

## 2024-05-29 ENCOUNTER — Other Ambulatory Visit: Payer: Self-pay | Admitting: Cardiology

## 2024-06-01 ENCOUNTER — Other Ambulatory Visit: Payer: Self-pay | Admitting: Family Medicine

## 2024-06-01 DIAGNOSIS — M51379 Other intervertebral disc degeneration, lumbosacral region without mention of lumbar back pain or lower extremity pain: Secondary | ICD-10-CM

## 2024-06-01 NOTE — Telephone Encounter (Unsigned)
 Copied from CRM #[id number]. Topic: Clinical - Medication Refill >> Jun 01, 2024 11:12 AM Nathanel BROCKS wrote: Medication: oxyCODONE  (ROXICODONE ) 15 MG immediate release tablet  Has the patient contacted their pharmacy? Yes  This is the patient's preferred pharmacy:  [facility] - [place], [place] - [address] [place] [address] Phone: [phone] Fax: [phone]  Is this the correct pharmacy for this prescription? Yes If no, delete pharmacy and type the correct one.   Has the prescription been filled recently? Yes  Is the patient out of the medication? Yes  Has the patient been seen for an appointment in the last year OR does the patient have an upcoming appointment? Yes  Can we respond through MyChart? Yes  Agent: Please be advised that Rx refills may take up to 3 business days. We ask that you follow-up with your pharmacy.

## 2024-06-02 ENCOUNTER — Other Ambulatory Visit: Payer: Self-pay | Admitting: Family Medicine

## 2024-06-02 DIAGNOSIS — M51379 Other intervertebral disc degeneration, lumbosacral region without mention of lumbar back pain or lower extremity pain: Secondary | ICD-10-CM

## 2024-06-03 NOTE — Telephone Encounter (Signed)
 Requested medication (s) are due for refill today: no  Requested medication (s) are on the active medication list: yes  Last refill:  06/02/24  Future visit scheduled: yes  Notes to clinic:  Unable to refill per protocol, cannot delegate. Unable to refuse.      Requested Prescriptions  Pending Prescriptions Disp Refills   oxyCODONE  (ROXICODONE ) 15 MG immediate release tablet 180 tablet 0    Sig: Take 1-2 tablets (15-30 mg total) by mouth every 4 (four) hours as needed. for pain     Not Delegated - Analgesics:  Opioid Agonists Failed - 06/03/2024  9:25 AM      Failed - This refill cannot be delegated      Failed - Urine Drug Screen completed in last 360 days      Passed - Valid encounter within last 3 months    Recent Outpatient Visits           1 month ago Spondylosis of thoracic region without myelopathy or radiculopathy   [name], [facility]   5 months ago Dyslipidemia   [facility] [name], MD

## 2024-06-07 ENCOUNTER — Telehealth: Payer: Self-pay

## 2024-06-07 ENCOUNTER — Telehealth: Payer: Self-pay | Admitting: Family Medicine

## 2024-06-07 NOTE — Telephone Encounter (Signed)
 Sending disability placard form back for completion.  Call patient when ready for pickup [phone]

## 2024-06-08 NOTE — Telephone Encounter (Signed)
 Completed and left at my workstation

## 2024-06-09 NOTE — Telephone Encounter (Signed)
 Form placed up front ready for pick up. Patient aware.

## 2024-06-24 ENCOUNTER — Other Ambulatory Visit: Payer: Self-pay | Admitting: Family Medicine

## 2024-06-28 ENCOUNTER — Other Ambulatory Visit: Payer: Self-pay | Admitting: Family Medicine

## 2024-06-28 DIAGNOSIS — M51379 Other intervertebral disc degeneration, lumbosacral region without mention of lumbar back pain or lower extremity pain: Secondary | ICD-10-CM

## 2024-06-28 NOTE — Telephone Encounter (Signed)
 Copied from CRM [id number]. Topic: Clinical - Medication Refill >> Jun 28, 2024  4:25 PM Winona R wrote: Medication: oxyCODONE  (ROXICODONE ) 15 MG immediate release tablet  Has the patient contacted their pharmacy? No (Agent: If no, request that the patient contact the pharmacy for the refill. If patient does not wish to contact the pharmacy document the reason why and proceed with request.) (Agent: If yes, when and what did the pharmacy advise?)  This is the patient's preferred pharmacy:  [facility] - [place], [place] - [address] [place] [address] Phone: [phone] Fax: [phone] Is this the correct pharmacy for this prescription? Yes If no, delete pharmacy and type the correct one.   Has the prescription been filled recently? Yes  Is the patient out of the medication? No  Has the patient been seen for an appointment in the last year OR does the patient have an upcoming appointment? Yes  Can we respond through MyChart? Yes  Agent: Please be advised that Rx refills may take up to 3 business days. We ask that you follow-up with your pharmacy.

## 2024-06-30 MED ORDER — OXYCODONE HCL 15 MG PO TABS: 15.0000 mg | ORAL_TABLET | ORAL | 0 refills | Status: DC | PRN

## 2024-06-30 NOTE — Telephone Encounter (Signed)
 Requested medication (s) are due for refill today - yes  Requested medication (s) are on the active medication list -yes  Future visit scheduled -yes  Last refill: 06/02/24 #180  Notes to clinic: non delegated Rx  Requested Prescriptions  Pending Prescriptions Disp Refills   oxyCODONE  (ROXICODONE ) 15 MG immediate release tablet 180 tablet 0    Sig: Take 1-2 tablets (15-30 mg total) by mouth every 4 (four) hours as needed. for pain     Not Delegated - Analgesics:  Opioid Agonists Failed - 06/30/2024 11:14 AM      Failed - This refill cannot be delegated      Failed - Urine Drug Screen completed in last 360 days      Passed - Valid encounter within last 3 months    Recent Outpatient Visits           2 months ago Spondylosis of thoracic region without myelopathy or radiculopathy   [facility] [name], [facility]   6 months ago Dyslipidemia   [facility] [name], [facility]                 Requested Prescriptions  Pending Prescriptions Disp Refills   oxyCODONE  (ROXICODONE ) 15 MG immediate release tablet 180 tablet 0    Sig: Take 1-2 tablets (15-30 mg total) by mouth every 4 (four) hours as needed. for pain     Not Delegated - Analgesics:  Opioid Agonists Failed - 06/30/2024 11:14 AM      Failed - This refill cannot be delegated      Failed - Urine Drug Screen completed in last 360 days      Passed - Valid encounter within last 3 months    Recent Outpatient Visits           2 months ago Spondylosis of thoracic region without myelopathy or radiculopathy   [facility] [name], MD   6 months ago Dyslipidemia   [facility] [name], MD

## 2024-07-08 ENCOUNTER — Other Ambulatory Visit: Payer: Self-pay | Admitting: Family Medicine

## 2024-07-08 MED ORDER — CYCLOBENZAPRINE HCL 10 MG PO TABS: 10.0000 mg | ORAL_TABLET | Freq: Three times a day (TID) | ORAL | 2 refills | Status: AC | PRN

## 2024-07-08 NOTE — Telephone Encounter (Signed)
 [facility] faxed refill request for the following medications:   cyclobenzaprine  (FLEXERIL ) 10 MG tablet    Please advise.

## 2024-07-26 ENCOUNTER — Other Ambulatory Visit: Payer: Self-pay | Admitting: Family Medicine

## 2024-07-26 DIAGNOSIS — M51379 Other intervertebral disc degeneration, lumbosacral region without mention of lumbar back pain or lower extremity pain: Secondary | ICD-10-CM

## 2024-07-27 ENCOUNTER — Ambulatory Visit: Payer: 59

## 2024-07-28 MED ORDER — OXYCODONE HCL 15 MG PO TABS: 15.0000 mg | ORAL_TABLET | ORAL | 0 refills | Status: AC | PRN

## 2024-07-28 NOTE — Telephone Encounter (Signed)
 Requested medication (s) are due for refill today: na   Requested medication (s) are on the active medication list: yes   Last refill:  06/30/24 #180 0 refills  Future visit scheduled: yes 10/07/24  Notes to clinic:  not delegated per protocol. Do you want to refill Rx?     Requested Prescriptions  Pending Prescriptions Disp Refills   oxyCODONE  (ROXICODONE ) 15 MG immediate release tablet 180 tablet 0    Sig: Take 1-2 tablets (15-30 mg total) by mouth every 4 (four) hours as needed. for pain     Not Delegated - Analgesics:  Opioid Agonists Failed - 07/28/2024  7:59 AM      Failed - This refill cannot be delegated      Failed - Urine Drug Screen completed in last 360 days      Failed - Valid encounter within last 3 months    Recent Outpatient Visits           3 months ago Spondylosis of thoracic region without myelopathy or radiculopathy   [facility] [name], MD   6 months ago Dyslipidemia   [facility] [name], MD

## 2024-10-07 ENCOUNTER — Ambulatory Visit: Admitting: Family Medicine

## 2024-12-21 ENCOUNTER — Ambulatory Visit
